# Patient Record
Sex: Female | Born: 1970
Health system: Southern US, Community
[De-identification: ages and names within clinical notes are randomized; demographics above are authoritative.]

## PROBLEM LIST (undated history)

## (undated) DIAGNOSIS — K219 Gastro-esophageal reflux disease without esophagitis: Secondary | ICD-10-CM

## (undated) DIAGNOSIS — E559 Vitamin D deficiency, unspecified: Secondary | ICD-10-CM

## (undated) DIAGNOSIS — E785 Hyperlipidemia, unspecified: Secondary | ICD-10-CM

## (undated) DIAGNOSIS — R809 Proteinuria, unspecified: Secondary | ICD-10-CM

## (undated) DIAGNOSIS — R0602 Shortness of breath: Secondary | ICD-10-CM

## (undated) DIAGNOSIS — IMO0001 Reserved for inherently not codable concepts without codable children: Secondary | ICD-10-CM

## (undated) DIAGNOSIS — Z6841 Body Mass Index (BMI) 40.0 and over, adult: Secondary | ICD-10-CM

## (undated) DIAGNOSIS — N189 Chronic kidney disease, unspecified: Secondary | ICD-10-CM

## (undated) DIAGNOSIS — M51379 Other intervertebral disc degeneration, lumbosacral region without mention of lumbar back pain or lower extremity pain: Secondary | ICD-10-CM

## (undated) DIAGNOSIS — I10 Essential (primary) hypertension: Secondary | ICD-10-CM

## (undated) DIAGNOSIS — D0501 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: Secondary | ICD-10-CM

## (undated) DIAGNOSIS — R42 Dizziness and giddiness: Secondary | ICD-10-CM

## (undated) DIAGNOSIS — Z972 Presence of dental prosthetic device (complete) (partial): Secondary | ICD-10-CM

## (undated) DIAGNOSIS — F32A Depression, unspecified: Secondary | ICD-10-CM

## (undated) DIAGNOSIS — D649 Anemia, unspecified: Secondary | ICD-10-CM

## (undated) DIAGNOSIS — N182 Chronic kidney disease, stage 2 (mild): Secondary | ICD-10-CM

## (undated) DIAGNOSIS — D0500 Lobular carcinoma in situ of unspecified breast: Secondary | ICD-10-CM

## (undated) DIAGNOSIS — E119 Type 2 diabetes mellitus without complications: Secondary | ICD-10-CM

## (undated) DIAGNOSIS — F419 Anxiety disorder, unspecified: Secondary | ICD-10-CM

## (undated) DIAGNOSIS — M5136 Other intervertebral disc degeneration, lumbar region: Secondary | ICD-10-CM

## (undated) DIAGNOSIS — K449 Diaphragmatic hernia without obstruction or gangrene: Secondary | ICD-10-CM

## (undated) DIAGNOSIS — F329 Major depressive disorder, single episode, unspecified: Secondary | ICD-10-CM

## (undated) DIAGNOSIS — N2581 Secondary hyperparathyroidism of renal origin: Secondary | ICD-10-CM

## (undated) DIAGNOSIS — R1312 Dysphagia, oropharyngeal phase: Secondary | ICD-10-CM

## (undated) DIAGNOSIS — G4733 Obstructive sleep apnea (adult) (pediatric): Secondary | ICD-10-CM

## (undated) DIAGNOSIS — C801 Malignant (primary) neoplasm, unspecified: Secondary | ICD-10-CM

## (undated) DIAGNOSIS — R258 Other abnormal involuntary movements: Secondary | ICD-10-CM

## (undated) DIAGNOSIS — M797 Fibromyalgia: Secondary | ICD-10-CM

## (undated) DIAGNOSIS — M1990 Unspecified osteoarthritis, unspecified site: Secondary | ICD-10-CM

## (undated) DIAGNOSIS — E7800 Pure hypercholesterolemia, unspecified: Secondary | ICD-10-CM

## (undated) DIAGNOSIS — G629 Polyneuropathy, unspecified: Secondary | ICD-10-CM

## (undated) DIAGNOSIS — M5137 Other intervertebral disc degeneration, lumbosacral region: Secondary | ICD-10-CM

## (undated) DIAGNOSIS — D509 Iron deficiency anemia, unspecified: Secondary | ICD-10-CM

## (undated) DIAGNOSIS — D259 Leiomyoma of uterus, unspecified: Secondary | ICD-10-CM

## (undated) HISTORY — DX: Type 2 diabetes mellitus without complications: E11.9

## (undated) HISTORY — PX: BREAST EXCISIONAL BIOPSY: SUR124

## (undated) HISTORY — PX: BREAST SURGERY: SHX581

## (undated) HISTORY — DX: Hyperlipidemia, unspecified: E78.5

## (undated) HISTORY — PX: ABLATION: SHX5711

## (undated) HISTORY — DX: Chronic kidney disease, stage 2 (mild): N18.2

## (undated) HISTORY — PX: REFRACTIVE SURGERY: SHX103

## (undated) HISTORY — PX: TRIGGER FINGER RELEASE: SHX641

## (undated) HISTORY — DX: Essential (primary) hypertension: I10

## (undated) HISTORY — DX: Lobular carcinoma in situ of unspecified breast: D05.00

## (undated) HISTORY — DX: Obstructive sleep apnea (adult) (pediatric): G47.33

## (undated) HISTORY — DX: Dizziness and giddiness: R42

---

## 2000-11-04 ENCOUNTER — Emergency Department (HOSPITAL_COMMUNITY): Admission: EM | Admit: 2000-11-04 | Discharge: 2000-11-04 | Payer: Self-pay | Admitting: Emergency Medicine

## 2008-07-09 ENCOUNTER — Emergency Department (HOSPITAL_COMMUNITY): Admission: EM | Admit: 2008-07-09 | Discharge: 2008-07-09 | Payer: Self-pay | Admitting: Emergency Medicine

## 2008-07-25 ENCOUNTER — Emergency Department (HOSPITAL_COMMUNITY): Admission: EM | Admit: 2008-07-25 | Discharge: 2008-07-25 | Payer: Self-pay | Admitting: Family Medicine

## 2008-08-09 ENCOUNTER — Emergency Department (HOSPITAL_COMMUNITY): Admission: EM | Admit: 2008-08-09 | Discharge: 2008-08-09 | Payer: Self-pay | Admitting: Emergency Medicine

## 2008-08-30 ENCOUNTER — Encounter: Admission: RE | Admit: 2008-08-30 | Discharge: 2008-10-11 | Payer: Self-pay | Admitting: Orthopedic Surgery

## 2008-10-16 ENCOUNTER — Encounter: Admission: RE | Admit: 2008-10-16 | Discharge: 2008-11-15 | Payer: Self-pay | Admitting: Orthopedic Surgery

## 2010-11-03 ENCOUNTER — Ambulatory Visit (HOSPITAL_COMMUNITY)
Admission: RE | Admit: 2010-11-03 | Discharge: 2010-11-03 | Payer: Self-pay | Source: Home / Self Care | Attending: Gastroenterology | Admitting: Gastroenterology

## 2011-06-17 ENCOUNTER — Other Ambulatory Visit: Payer: Self-pay | Admitting: Obstetrics and Gynecology

## 2011-06-17 DIAGNOSIS — R809 Proteinuria, unspecified: Secondary | ICD-10-CM

## 2011-06-22 ENCOUNTER — Ambulatory Visit (HOSPITAL_COMMUNITY)
Admission: RE | Admit: 2011-06-22 | Discharge: 2011-06-22 | Disposition: A | Discharge status: Home / Self Care | Payer: Self-pay | Source: Ambulatory Visit | Attending: Obstetrics and Gynecology | Admitting: Obstetrics and Gynecology

## 2011-06-22 DIAGNOSIS — E119 Type 2 diabetes mellitus without complications: Secondary | ICD-10-CM | POA: Insufficient documentation

## 2011-06-22 DIAGNOSIS — R809 Proteinuria, unspecified: Secondary | ICD-10-CM | POA: Insufficient documentation

## 2011-06-22 DIAGNOSIS — I10 Essential (primary) hypertension: Secondary | ICD-10-CM | POA: Insufficient documentation

## 2011-10-13 HISTORY — PX: BREAST BIOPSY: SHX20

## 2011-11-25 ENCOUNTER — Other Ambulatory Visit (HOSPITAL_COMMUNITY): Payer: Self-pay | Admitting: Physician Assistant

## 2011-11-25 DIAGNOSIS — Z1231 Encounter for screening mammogram for malignant neoplasm of breast: Secondary | ICD-10-CM

## 2011-12-01 ENCOUNTER — Ambulatory Visit (HOSPITAL_COMMUNITY)
Admission: RE | Admit: 2011-12-01 | Discharge: 2011-12-01 | Disposition: A | Discharge status: Home / Self Care | Payer: Self-pay | Source: Ambulatory Visit | Attending: Physician Assistant | Admitting: Physician Assistant

## 2011-12-01 DIAGNOSIS — Z1231 Encounter for screening mammogram for malignant neoplasm of breast: Secondary | ICD-10-CM

## 2011-12-08 ENCOUNTER — Other Ambulatory Visit: Payer: Self-pay | Admitting: Physician Assistant

## 2011-12-08 DIAGNOSIS — R928 Other abnormal and inconclusive findings on diagnostic imaging of breast: Secondary | ICD-10-CM

## 2011-12-29 ENCOUNTER — Ambulatory Visit (INDEPENDENT_AMBULATORY_CARE_PROVIDER_SITE_OTHER): Payer: Self-pay | Admitting: *Deleted

## 2011-12-29 VITALS — BP 125/89 | HR 82 | Temp 97.7°F | Ht 63.0 in | Wt 245.5 lb

## 2011-12-29 DIAGNOSIS — Z1239 Encounter for other screening for malignant neoplasm of breast: Secondary | ICD-10-CM

## 2011-12-29 DIAGNOSIS — N63 Unspecified lump in unspecified breast: Secondary | ICD-10-CM

## 2011-12-29 DIAGNOSIS — N6315 Unspecified lump in the right breast, overlapping quadrants: Secondary | ICD-10-CM

## 2011-12-29 NOTE — Progress Notes (Signed)
Referred from the [facility] for additional imaging of the right breast. Initial screening mammogram was completed 12/01/11.  Pap Smear:    Pap smear not performed today. Per patient last Pap smear was March 2012 at the free Pap smear screening at [facility] and was normal. Per patient she has no history of abnormal Pap smears. No Pap smear results in EPIC.  Physical exam: Breasts Breasts symmetrical. No skin abnormalities bilateral breasts. No nipple retraction bilateral breasts. No nipple discharge bilateral breasts. No lymphadenopathy. No lumps palpated left breast. Palpated lump in the right breast at 9 o'clock around 1-2 cm from nipple. No complaints of pain or tenderness on exam. Patient referred to the [facility] of [facility] for right breat Diagnostic Mammogram and possible right breast ultrasound Monday, January 04, 2012 at 1020.         Pelvic/Bimanual No Pap smear completed today since last Pap smear was March 2012 and normal per patient. Pap smear not indicated per BCCCP guidelines.

## 2011-12-29 NOTE — Patient Instructions (Signed)
Taught patient how to perform BSE. Patient did not need a Pap smear today due to last Pap smear was March 2012 per patient. Told patient about free cervical cancer screenings to receive a Pap smear if would like one next year. Let her know BCCCP will cover Pap smears every 3 years unless has a history of abnormal Pap smears. Patient is scheduled for a right breast diagnostic mammogram next Monday, January 04, 2012 at 1020. Patient aware of appointment and will be there or if unable to make appointment will call the Breast Center and reschedule. Let patient know will follow up with her within the next couple weeks with results. Patient verbalized understanding.

## 2012-01-06 ENCOUNTER — Ambulatory Visit
Admission: RE | Admit: 2012-01-06 | Discharge: 2012-01-06 | Disposition: A | Discharge status: Home / Self Care | Payer: No Typology Code available for payment source | Source: Ambulatory Visit | Attending: Physician Assistant | Admitting: Physician Assistant

## 2012-01-06 DIAGNOSIS — R928 Other abnormal and inconclusive findings on diagnostic imaging of breast: Secondary | ICD-10-CM

## 2012-01-12 ENCOUNTER — Telehealth: Payer: Self-pay | Admitting: *Deleted

## 2012-01-12 NOTE — Telephone Encounter (Signed)
Telephoned pt at home left message to return call to Fonnie Mu [phone].

## 2012-01-13 ENCOUNTER — Telehealth: Payer: Self-pay | Admitting: *Deleted

## 2012-01-13 NOTE — Telephone Encounter (Signed)
Patient called me in regards to BCCCP results. Patient has received results from the [facility] for her diagnostic mammogram. Patient is aware that she will need a right breast diagnostic mammogram in 6 months. Told patient she can call and schedule at her convienece. Let patient know BCCCP will cover cost of follow up. Patient verbalized understanding.

## 2012-03-02 ENCOUNTER — Other Ambulatory Visit: Payer: Self-pay | Admitting: Obstetrics and Gynecology

## 2012-06-01 ENCOUNTER — Telehealth: Payer: Self-pay | Admitting: *Deleted

## 2012-06-01 NOTE — Telephone Encounter (Signed)
Telephoned patient at home # and spoke with patient and advised was time to schedule diagnostic mammogram at [facility]. Patient will call to schedule. Patient voiced understanding.

## 2012-06-03 ENCOUNTER — Other Ambulatory Visit: Payer: Self-pay | Admitting: Physician Assistant

## 2012-06-03 ENCOUNTER — Other Ambulatory Visit: Payer: Self-pay | Admitting: Obstetrics and Gynecology

## 2012-06-03 DIAGNOSIS — R921 Mammographic calcification found on diagnostic imaging of breast: Secondary | ICD-10-CM

## 2012-07-06 ENCOUNTER — Other Ambulatory Visit: Payer: Self-pay | Admitting: Obstetrics and Gynecology

## 2012-07-06 ENCOUNTER — Ambulatory Visit
Admission: RE | Admit: 2012-07-06 | Discharge: 2012-07-06 | Disposition: A | Discharge status: Home / Self Care | Payer: No Typology Code available for payment source | Source: Ambulatory Visit | Attending: Physician Assistant | Admitting: Physician Assistant

## 2012-07-06 DIAGNOSIS — R921 Mammographic calcification found on diagnostic imaging of breast: Secondary | ICD-10-CM

## 2012-07-12 DIAGNOSIS — D0501 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: Secondary | ICD-10-CM

## 2012-07-12 HISTORY — DX: 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: D05.01

## 2012-07-13 ENCOUNTER — Other Ambulatory Visit: Payer: Self-pay | Admitting: Obstetrics and Gynecology

## 2012-07-13 ENCOUNTER — Ambulatory Visit
Admission: RE | Admit: 2012-07-13 | Discharge: 2012-07-13 | Disposition: A | Discharge status: Home / Self Care | Payer: No Typology Code available for payment source | Source: Ambulatory Visit | Attending: Obstetrics and Gynecology | Admitting: Obstetrics and Gynecology

## 2012-07-13 DIAGNOSIS — R921 Mammographic calcification found on diagnostic imaging of breast: Secondary | ICD-10-CM

## 2012-07-13 DIAGNOSIS — N6099 Unspecified benign mammary dysplasia of unspecified breast: Secondary | ICD-10-CM | POA: Insufficient documentation

## 2012-07-14 ENCOUNTER — Ambulatory Visit
Admission: RE | Admit: 2012-07-14 | Discharge: 2012-07-14 | Disposition: A | Discharge status: Home / Self Care | Payer: No Typology Code available for payment source | Source: Ambulatory Visit | Attending: Obstetrics and Gynecology | Admitting: Obstetrics and Gynecology

## 2012-07-14 DIAGNOSIS — R921 Mammographic calcification found on diagnostic imaging of breast: Secondary | ICD-10-CM

## 2012-07-28 ENCOUNTER — Ambulatory Visit (INDEPENDENT_AMBULATORY_CARE_PROVIDER_SITE_OTHER): Payer: Medicaid Other | Admitting: Surgery

## 2012-07-28 ENCOUNTER — Encounter (INDEPENDENT_AMBULATORY_CARE_PROVIDER_SITE_OTHER): Payer: Self-pay | Admitting: Surgery

## 2012-07-28 VITALS — BP 138/80 | HR 80 | Temp 97.2°F | Resp 18 | Ht 63.0 in | Wt 247.0 lb

## 2012-07-28 DIAGNOSIS — I10 Essential (primary) hypertension: Secondary | ICD-10-CM | POA: Insufficient documentation

## 2012-07-28 DIAGNOSIS — Z1231 Encounter for screening mammogram for malignant neoplasm of breast: Secondary | ICD-10-CM

## 2012-07-28 DIAGNOSIS — E119 Type 2 diabetes mellitus without complications: Secondary | ICD-10-CM | POA: Insufficient documentation

## 2012-07-28 DIAGNOSIS — N6099 Unspecified benign mammary dysplasia of unspecified breast: Secondary | ICD-10-CM

## 2012-07-28 DIAGNOSIS — E7800 Pure hypercholesterolemia, unspecified: Secondary | ICD-10-CM | POA: Insufficient documentation

## 2012-07-28 NOTE — Patient Instructions (Signed)
We will schedule surgery to remove the abnormal area from your right breast

## 2012-07-28 NOTE — Addendum Note (Signed)
Addended by: Currie Paris on: 07/28/2012 03:07 PM   Modules accepted: Orders

## 2012-07-28 NOTE — Progress Notes (Signed)
Patient ID: Alicia Coffey, female   DOB: 09/14/1971, 41 y.o.   MRN: [id number]  Chief Complaint  Patient presents with  . Breast Mass    Right ALH on NCB    HPI Alicia Coffey is a 41 y.o. female.  She was found to have a area of calcifications in the right breast and a followup six-month mammogram showed some slight change and a core biopsy was done. This has been shown LCIS and surgical consultation was therefore recommended. She is having no breast symptoms at all. A grandmother had breast cancer at about age 47 but apparently is cured as she is still alive 35 years later. HPI  Past Medical History  Diagnosis Date  . Hypertension   . Diabetes mellitus   . Hyperlipidemia     No past surgical history on file.  Family History  Problem Relation Age of Onset  . Breast cancer Paternal Grandmother     breast  . Diabetes Father   . Heart disease Father   . Hypertension Father   . Diabetes Mother   . Heart disease Mother   . Hypertension Mother   . Diabetes Brother   . Diabetes Sister   . Diabetes Sister     Social History History  Substance Use Topics  . Smoking status: Never Smoker   . Smokeless tobacco: Never Used  . Alcohol Use: No    No Known Allergies  Current Outpatient Prescriptions  Medication Sig Dispense Refill  . fish oil-omega-3 fatty acids 1000 MG capsule Take 2 g by mouth daily.      . glipiZIDE (GLUCOTROL) 10 MG tablet Take 10 mg by mouth 2 (two) times daily before a meal.      . insulin aspart (NOVOLOG) 100 UNIT/ML injection Inject into the skin 3 (three) times daily before meals. Sliding scale      . insulin glargine (LANTUS) 100 UNIT/ML injection Inject 14 Units into the skin at bedtime.      . lisinopril-hydrochlorothiazide (PRINZIDE,ZESTORETIC) 20-25 MG per tablet Take 1 tablet by mouth 2 (two) times daily.      . metFORMIN (GLUCOPHAGE) 1000 MG tablet Take 1,000 mg by mouth 2 times daily at 12 noon and 4 pm.      . pravastatin (PRAVACHOL) 40 MG  tablet Take 40 mg by mouth at bedtime.      . vitamin E 400 UNIT capsule Take 400 Units by mouth daily.        Review of Systems Review of Systems  Constitutional: Negative for fever, chills and unexpected weight change.  HENT: Negative for hearing loss, congestion, sore throat, trouble swallowing and voice change.   Eyes: Negative for visual disturbance.  Respiratory: Negative for cough and wheezing.   Cardiovascular: Negative for chest pain, palpitations and leg swelling.  Gastrointestinal: Negative for nausea, vomiting, abdominal pain, diarrhea, constipation, blood in stool, abdominal distention and anal bleeding.  Genitourinary: Negative for hematuria, vaginal bleeding and difficulty urinating.  Musculoskeletal: Negative for arthralgias.  Skin: Negative for rash and wound.  Neurological: Negative for seizures, syncope and headaches.  Hematological: Negative for adenopathy. Does not bruise/bleed easily.  Psychiatric/Behavioral: Negative for confusion.    Blood pressure 138/80, pulse 80, temperature 97.2 F (36.2 C), temperature source Temporal, resp. rate 18, height 5' 3" (1.6 m), weight 247 lb (112.038 kg), last menstrual period 07/06/2012.  Physical Exam Physical Exam  Vitals reviewed. Constitutional: She is oriented to person, place, and time. She appears well-developed and well-nourished. No distress.    HENT:  Head: Normocephalic and atraumatic.  Mouth/Throat: Oropharynx is clear and moist.  Eyes: Conjunctivae normal and EOM are normal. Pupils are equal, round, and reactive to light. No scleral icterus.  Neck: Normal range of motion. Neck supple. No tracheal deviation present. No thyromegaly present.  Cardiovascular: Normal rate, regular rhythm, normal heart sounds and intact distal pulses.  Exam reveals no gallop and no friction rub.   No murmur heard. Pulmonary/Chest: Effort normal and breath sounds normal. No respiratory distress. She has no wheezes. She has no rales.          A small mass in the right breast lower outer quadrant consistent with post biopsy small hematoma  Abdominal: Soft. Bowel sounds are normal. She exhibits no distension and no mass. There is no tenderness. There is no rebound and no guarding.  Musculoskeletal: Normal range of motion. She exhibits no edema and no tenderness.  Lymphadenopathy:    She has no cervical adenopathy.    She has no axillary adenopathy.       Right: No supraclavicular adenopathy present.       Left: No supraclavicular adenopathy present.  Neurological: She is alert and oriented to person, place, and time.  Skin: Skin is warm and dry. No rash noted. She is not diaphoretic. No erythema.  Psychiatric: She has a normal mood and affect. Her behavior is normal. Judgment and thought content normal.    Data Reviewed I have reviewed mammogram reports and pathology reports.  Assessment    1. LCIS right breast, lower outer quadrant 2. Hypertension 3 high cholesterol 4 diabetes    Plan    I have recommended wire localized excision of this area. I have discussed the indications for the lumpectomy and described the procedure. She understand that the chance of removal of the abnormal area is very good, but that occasionally we are unable to locate it and may have to do a second procedure. We also discussed the possibility of a second procedure to get additional tissue. Risks of surgery such as bleeding and infection have also been explained, as well as the implications of not doing the surgery. She understands and wishes to proceed.        Rani Sisney J 07/28/2012, 2:40 PM    

## 2012-07-28 NOTE — Addendum Note (Signed)
Addended by: Currie Paris on: 07/28/2012 05:50 PM   Modules accepted: Orders

## 2012-08-09 ENCOUNTER — Encounter (HOSPITAL_BASED_OUTPATIENT_CLINIC_OR_DEPARTMENT_OTHER): Payer: Self-pay | Admitting: *Deleted

## 2012-08-09 NOTE — Pre-Procedure Instructions (Signed)
To come for BMET and EKG 

## 2012-08-11 ENCOUNTER — Encounter (HOSPITAL_BASED_OUTPATIENT_CLINIC_OR_DEPARTMENT_OTHER)
Admission: RE | Admit: 2012-08-11 | Discharge: 2012-08-11 | Disposition: A | Discharge status: Home / Self Care | Payer: Medicaid Other | Source: Ambulatory Visit | Attending: Surgery | Admitting: Surgery

## 2012-08-11 ENCOUNTER — Other Ambulatory Visit: Payer: Self-pay

## 2012-08-11 LAB — BASIC METABOLIC PANEL
BUN: 16 mg/dL (ref 6–23)
CO2: 27 mEq/L (ref 19–32)
Chloride: 101 mEq/L (ref 96–112)
GFR calc non Af Amer: >90 mL/min (ref >90)
Glucose, Bld: 146 mg/dL — ABNORMAL HIGH (ref 70–99)
Potassium: 4.4 mEq/L (ref 3.5–5.1)

## 2012-08-15 ENCOUNTER — Ambulatory Visit
Admission: RE | Admit: 2012-08-15 | Discharge: 2012-08-15 | Disposition: A | Discharge status: Home / Self Care | Payer: No Typology Code available for payment source | Source: Ambulatory Visit | Attending: Surgery | Admitting: Surgery

## 2012-08-15 ENCOUNTER — Ambulatory Visit (HOSPITAL_BASED_OUTPATIENT_CLINIC_OR_DEPARTMENT_OTHER)
Admission: RE | Admit: 2012-08-15 | Discharge: 2012-08-15 | Disposition: A | Discharge status: Home / Self Care | Payer: Medicaid Other | Source: Ambulatory Visit | Attending: Surgery | Admitting: Surgery

## 2012-08-15 ENCOUNTER — Encounter (HOSPITAL_BASED_OUTPATIENT_CLINIC_OR_DEPARTMENT_OTHER)
Admission: RE | Disposition: A | Discharge status: Home / Self Care | Payer: Self-pay | Source: Ambulatory Visit | Attending: Surgery

## 2012-08-15 ENCOUNTER — Encounter (HOSPITAL_BASED_OUTPATIENT_CLINIC_OR_DEPARTMENT_OTHER): Payer: Self-pay | Admitting: Certified Registered"

## 2012-08-15 ENCOUNTER — Encounter (HOSPITAL_BASED_OUTPATIENT_CLINIC_OR_DEPARTMENT_OTHER): Payer: Self-pay | Admitting: *Deleted

## 2012-08-15 ENCOUNTER — Encounter (HOSPITAL_BASED_OUTPATIENT_CLINIC_OR_DEPARTMENT_OTHER): Payer: Self-pay | Admitting: Anesthesiology

## 2012-08-15 ENCOUNTER — Other Ambulatory Visit (INDEPENDENT_AMBULATORY_CARE_PROVIDER_SITE_OTHER): Payer: Self-pay | Admitting: Surgery

## 2012-08-15 ENCOUNTER — Ambulatory Visit (HOSPITAL_BASED_OUTPATIENT_CLINIC_OR_DEPARTMENT_OTHER): Payer: Medicaid Other | Admitting: Certified Registered"

## 2012-08-15 DIAGNOSIS — E119 Type 2 diabetes mellitus without complications: Secondary | ICD-10-CM | POA: Insufficient documentation

## 2012-08-15 DIAGNOSIS — D0590 Unspecified type of carcinoma in situ of unspecified breast: Secondary | ICD-10-CM

## 2012-08-15 DIAGNOSIS — Z01812 Encounter for preprocedural laboratory examination: Secondary | ICD-10-CM | POA: Insufficient documentation

## 2012-08-15 DIAGNOSIS — Z79899 Other long term (current) drug therapy: Secondary | ICD-10-CM | POA: Insufficient documentation

## 2012-08-15 DIAGNOSIS — E785 Hyperlipidemia, unspecified: Secondary | ICD-10-CM | POA: Insufficient documentation

## 2012-08-15 DIAGNOSIS — Z794 Long term (current) use of insulin: Secondary | ICD-10-CM | POA: Insufficient documentation

## 2012-08-15 DIAGNOSIS — N6099 Unspecified benign mammary dysplasia of unspecified breast: Secondary | ICD-10-CM

## 2012-08-15 DIAGNOSIS — I10 Essential (primary) hypertension: Secondary | ICD-10-CM | POA: Insufficient documentation

## 2012-08-15 DIAGNOSIS — Z01810 Encounter for preprocedural cardiovascular examination: Secondary | ICD-10-CM | POA: Insufficient documentation

## 2012-08-15 HISTORY — PX: BREAST LUMPECTOMY WITH NEEDLE LOCALIZATION: SHX5759

## 2012-08-15 HISTORY — DX: Presence of dental prosthetic device (complete) (partial): Z97.2

## 2012-08-15 HISTORY — DX: Unspecified osteoarthritis, unspecified site: M19.90

## 2012-08-15 HISTORY — DX: 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: D05.01

## 2012-08-15 SURGERY — BREAST LUMPECTOMY WITH NEEDLE LOCALIZATION
Anesthesia: General | Site: Breast | Laterality: Right | Wound class: Clean

## 2012-08-15 MED ORDER — OXYCODONE HCL 5 MG PO TABS
5.0000 mg | ORAL_TABLET | Freq: Once | ORAL | Status: AC | PRN
  Administered 2012-08-15: 5 mg via ORAL

## 2012-08-15 MED ORDER — LIDOCAINE HCL (CARDIAC) 20 MG/ML IV SOLN
INTRAVENOUS | Status: DC | PRN
  Administered 2012-08-15: 80 mg via INTRAVENOUS

## 2012-08-15 MED ORDER — LACTATED RINGERS IV SOLN
INTRAVENOUS | Status: DC
  Administered 2012-08-15 (×2): via INTRAVENOUS

## 2012-08-15 MED ORDER — OXYCODONE HCL 5 MG/5ML PO SOLN: 5.0000 mg | Freq: Once | ORAL | Status: AC | PRN

## 2012-08-15 MED ORDER — CEFAZOLIN SODIUM-DEXTROSE 2-3 GM-% IV SOLR
2.0000 g | INTRAVENOUS | Status: AC
  Administered 2012-08-15: 2 g via INTRAVENOUS

## 2012-08-15 MED ORDER — FENTANYL CITRATE 0.05 MG/ML IJ SOLN
INTRAMUSCULAR | Status: DC | PRN
  Administered 2012-08-15 (×2): 50 ug via INTRAVENOUS

## 2012-08-15 MED ORDER — MIDAZOLAM HCL 5 MG/5ML IJ SOLN
INTRAMUSCULAR | Status: DC | PRN
  Administered 2012-08-15: 2 mg via INTRAVENOUS

## 2012-08-15 MED ORDER — ONDANSETRON HCL 4 MG/2ML IJ SOLN: 4.0000 mg | Freq: Once | INTRAMUSCULAR | Status: DC | PRN

## 2012-08-15 MED ORDER — PROPOFOL 10 MG/ML IV BOLUS
INTRAVENOUS | Status: DC | PRN
  Administered 2012-08-15: 200 mg via INTRAVENOUS

## 2012-08-15 MED ORDER — EPHEDRINE SULFATE 50 MG/ML IJ SOLN
INTRAMUSCULAR | Status: DC | PRN
  Administered 2012-08-15: 10 mg via INTRAVENOUS

## 2012-08-15 MED ORDER — ONDANSETRON HCL 4 MG/2ML IJ SOLN
INTRAMUSCULAR | Status: DC | PRN
  Administered 2012-08-15: 4 mg via INTRAVENOUS

## 2012-08-15 MED ORDER — CHLORHEXIDINE GLUCONATE 4 % EX LIQD: 1.0000 "application " | Freq: Once | CUTANEOUS | Status: DC

## 2012-08-15 MED ORDER — HYDROCODONE-ACETAMINOPHEN 5-325 MG PO TABS: 1.0000 | ORAL_TABLET | ORAL | Status: DC | PRN

## 2012-08-15 MED ORDER — BUPIVACAINE HCL (PF) 0.25 % IJ SOLN
INTRAMUSCULAR | Status: DC | PRN
  Administered 2012-08-15: 40 mL

## 2012-08-15 MED ORDER — HYDROMORPHONE HCL PF 1 MG/ML IJ SOLN: 0.2500 mg | INTRAMUSCULAR | Status: DC | PRN

## 2012-08-15 SURGICAL SUPPLY — 47 items
APPLICATOR COTTON TIP 6IN STRL (MISCELLANEOUS) IMPLANT
BINDER BREAST LRG (GAUZE/BANDAGES/DRESSINGS) IMPLANT
BINDER BREAST MEDIUM (GAUZE/BANDAGES/DRESSINGS) IMPLANT
BINDER BREAST XLRG (GAUZE/BANDAGES/DRESSINGS) IMPLANT
BINDER BREAST XXLRG (GAUZE/BANDAGES/DRESSINGS) ×2 IMPLANT
BLADE HEX COATED 2.75 (ELECTRODE) ×2 IMPLANT
BLADE SURG 15 STRL LF DISP TIS (BLADE) ×1 IMPLANT
BLADE SURG 15 STRL SS (BLADE) ×1
CANISTER SUCTION 1200CC (MISCELLANEOUS) ×2 IMPLANT
CHLORAPREP W/TINT 26ML (MISCELLANEOUS) ×2 IMPLANT
CLIP TI MEDIUM 6 (CLIP) IMPLANT
CLIP TI WIDE RED SMALL 6 (CLIP) IMPLANT
CLOTH BEACON ORANGE TIMEOUT ST (SAFETY) ×2 IMPLANT
COVER MAYO STAND STRL (DRAPES) ×2 IMPLANT
COVER TABLE BACK 60X90 (DRAPES) ×2 IMPLANT
DECANTER SPIKE VIAL GLASS SM (MISCELLANEOUS) IMPLANT
DERMABOND ADVANCED (GAUZE/BANDAGES/DRESSINGS) ×1
DERMABOND ADVANCED .7 DNX12 (GAUZE/BANDAGES/DRESSINGS) ×1 IMPLANT
DEVICE DUBIN W/COMP PLATE 8390 (MISCELLANEOUS) ×2 IMPLANT
DRAPE LAPAROTOMY TRNSV 102X78 (DRAPE) ×2 IMPLANT
DRAPE SURG 17X23 STRL (DRAPES) IMPLANT
DRAPE UTILITY XL STRL (DRAPES) ×2 IMPLANT
ELECT REM PT RETURN 9FT ADLT (ELECTROSURGICAL) ×2
ELECTRODE REM PT RTRN 9FT ADLT (ELECTROSURGICAL) ×1 IMPLANT
GLOVE EUDERMIC 7 POWDERFREE (GLOVE) ×2 IMPLANT
GLOVE SKINSENSE NS SZ7.0 (GLOVE) ×1
GLOVE SKINSENSE STRL SZ7.0 (GLOVE) ×1 IMPLANT
GOWN PREVENTION PLUS XLARGE (GOWN DISPOSABLE) ×4 IMPLANT
KIT MARKER MARGIN INK (KITS) IMPLANT
NEEDLE HYPO 25X1 1.5 SAFETY (NEEDLE) ×2 IMPLANT
NS IRRIG 1000ML POUR BTL (IV SOLUTION) IMPLANT
PACK BASIN DAY SURGERY FS (CUSTOM PROCEDURE TRAY) ×2 IMPLANT
PENCIL BUTTON HOLSTER BLD 10FT (ELECTRODE) ×2 IMPLANT
SHEET MEDIUM DRAPE 40X70 STRL (DRAPES) IMPLANT
SLEEVE SCD COMPRESS KNEE MED (MISCELLANEOUS) ×2 IMPLANT
SPONGE GAUZE 4X4 12PLY (GAUZE/BANDAGES/DRESSINGS) IMPLANT
SPONGE INTESTINAL PEANUT (DISPOSABLE) IMPLANT
SPONGE LAP 4X18 X RAY DECT (DISPOSABLE) ×2 IMPLANT
STAPLER VISISTAT 35W (STAPLE) IMPLANT
SUT MNCRL AB 4-0 PS2 18 (SUTURE) ×2 IMPLANT
SUT SILK 0 TIES 10X30 (SUTURE) IMPLANT
SUT VICRYL 3-0 CR8 SH (SUTURE) ×2 IMPLANT
SYR CONTROL 10ML LL (SYRINGE) ×2 IMPLANT
TOWEL OR NON WOVEN STRL DISP B (DISPOSABLE) ×2 IMPLANT
TUBE CONNECTING 20X1/4 (TUBING) ×2 IMPLANT
WATER STERILE IRR 1000ML POUR (IV SOLUTION) ×2 IMPLANT
YANKAUER SUCT BULB TIP NO VENT (SUCTIONS) ×2 IMPLANT

## 2012-08-15 NOTE — Transfer of Care (Signed)
Immediate Anesthesia Transfer of Care Note  Patient: Alicia Coffey  Procedure(s) Performed: Procedure(s) (LRB) with comments: BREAST LUMPECTOMY WITH NEEDLE LOCALIZATION (Right) - Wire localizations Right breast calcifications  Patient Location: PACU  Anesthesia Type:General  Level of Consciousness: awake, alert  and oriented  Airway & Oxygen Therapy: Patient Spontanous Breathing and Patient connected to face mask oxygen  Post-op Assessment: Report given to PACU RN and Post -op Vital signs reviewed and stable  Post vital signs: Reviewed and stable  Complications: No apparent anesthesia complications

## 2012-08-15 NOTE — Anesthesia Postprocedure Evaluation (Signed)
  Anesthesia Post-op Note  Patient: Alicia Coffey  Procedure(s) Performed: Procedure(s) (LRB) with comments: BREAST LUMPECTOMY WITH NEEDLE LOCALIZATION (Right) - Wire localizations Right breast calcifications  Patient Location: PACU  Anesthesia Type:General  Level of Consciousness: awake, alert  and oriented  Airway and Oxygen Therapy: Patient Spontanous Breathing and Patient connected to face mask oxygen  Post-op Pain: mild  Post-op Assessment: Post-op Vital signs reviewed  Post-op Vital Signs: Reviewed  Complications: No apparent anesthesia complications

## 2012-08-15 NOTE — Anesthesia Preprocedure Evaluation (Signed)
Anesthesia Evaluation  Patient identified by MRN, date of birth, ID band Patient awake    Reviewed: Allergy & Precautions, H&P , NPO status , Patient's Chart, lab work & pertinent test results  Airway Mallampati: I TM Distance: >3 FB Neck ROM: Full    Dental  (+) Upper Dentures, Lower Dentures and Dental Advisory Given   Pulmonary  breath sounds clear to auscultation        Cardiovascular hypertension, Pt. on medications Rhythm:Regular Rate:Normal     Neuro/Psych    GI/Hepatic   Endo/Other  diabetes, Well Controlled, Type 2, Insulin Dependent and Oral Hypoglycemic AgentsMorbid obesity  Renal/GU      Musculoskeletal   Abdominal   Peds  Hematology   Anesthesia Other Findings   Reproductive/Obstetrics                           Anesthesia Physical Anesthesia Plan  ASA: III  Anesthesia Plan: General   Post-op Pain Management:    Induction: Intravenous  Airway Management Planned:   Additional Equipment:   Intra-op Plan:   Post-operative Plan: Extubation in OR  Informed Consent: I have reviewed the patients History and Physical, chart, labs and discussed the procedure including the risks, benefits and alternatives for the proposed anesthesia with the patient or authorized representative who has indicated his/her understanding and acceptance.   Dental advisory given  Plan Discussed with: Anesthesiologist, Surgeon and CRNA  Anesthesia Plan Comments:         Anesthesia Quick Evaluation

## 2012-08-15 NOTE — Anesthesia Procedure Notes (Addendum)
Performed by: Signa Kell C   Procedure Name: LMA Insertion Date/Time: 08/15/2012 12:54 PM Performed by: Burna Cash Pre-anesthesia Checklist: Patient identified, Emergency Drugs available, Suction available and Patient being monitored Patient Re-evaluated:Patient Re-evaluated prior to inductionOxygen Delivery Method: Circle System Utilized Preoxygenation: Pre-oxygenation with 100% oxygen Intubation Type: IV induction Ventilation: Mask ventilation without difficulty LMA: LMA with gastric port inserted LMA Size: 4.0 Number of attempts: 1 Airway Equipment and Method: bite block Placement Confirmation: positive ETCO2 Tube secured with: Tape Dental Injury: Teeth and Oropharynx as per pre-operative assessment

## 2012-08-15 NOTE — H&P (View-Only) (Signed)
Patient ID: Alicia Coffey, female   DOB: July 19, 1971, 41 y.o.   MRN: [id number]  Chief Complaint  Patient presents with  . Breast Mass    Right ALH on NCB    HPI Alicia Coffey is a 41 y.o. female.  She was found to have a area of calcifications in the right breast and a followup six-month mammogram showed some slight change and a core biopsy was done. This has been shown LCIS and surgical consultation was therefore recommended. She is having no breast symptoms at all. A grandmother had breast cancer at about age 55 but apparently is cured as she is still alive 35 years later. HPI  Past Medical History  Diagnosis Date  . Hypertension   . Diabetes mellitus   . Hyperlipidemia     No past surgical history on file.  Family History  Problem Relation Age of Onset  . Breast cancer Paternal Grandmother     breast  . Diabetes Father   . Heart disease Father   . Hypertension Father   . Diabetes Mother   . Heart disease Mother   . Hypertension Mother   . Diabetes Brother   . Diabetes Sister   . Diabetes Sister     Social History History  Substance Use Topics  . Smoking status: Never Smoker   . Smokeless tobacco: Never Used  . Alcohol Use: No    No Known Allergies  Current Outpatient Prescriptions  Medication Sig Dispense Refill  . fish oil-omega-3 fatty acids 1000 MG capsule Take 2 g by mouth daily.      Marland Kitchen glipiZIDE (GLUCOTROL) 10 MG tablet Take 10 mg by mouth 2 (two) times daily before a meal.      . insulin aspart (NOVOLOG) 100 UNIT/ML injection Inject into the skin 3 (three) times daily before meals. Sliding scale      . insulin glargine (LANTUS) 100 UNIT/ML injection Inject 14 Units into the skin at bedtime.      Marland Kitchen lisinopril-hydrochlorothiazide (PRINZIDE,ZESTORETIC) 20-25 MG per tablet Take 1 tablet by mouth 2 (two) times daily.      . metFORMIN (GLUCOPHAGE) 1000 MG tablet Take 1,000 mg by mouth 2 times daily at 12 noon and 4 pm.      . pravastatin (PRAVACHOL) 40 MG  tablet Take 40 mg by mouth at bedtime.      . vitamin E 400 UNIT capsule Take 400 Units by mouth daily.        Review of Systems Review of Systems  Constitutional: Negative for fever, chills and unexpected weight change.  HENT: Negative for hearing loss, congestion, sore throat, trouble swallowing and voice change.   Eyes: Negative for visual disturbance.  Respiratory: Negative for cough and wheezing.   Cardiovascular: Negative for chest pain, palpitations and leg swelling.  Gastrointestinal: Negative for nausea, vomiting, abdominal pain, diarrhea, constipation, blood in stool, abdominal distention and anal bleeding.  Genitourinary: Negative for hematuria, vaginal bleeding and difficulty urinating.  Musculoskeletal: Negative for arthralgias.  Skin: Negative for rash and wound.  Neurological: Negative for seizures, syncope and headaches.  Hematological: Negative for adenopathy. Does not bruise/bleed easily.  Psychiatric/Behavioral: Negative for confusion.    Blood pressure 138/80, pulse 80, temperature 97.2 F (36.2 C), temperature source Temporal, resp. rate 18, height 5\' 3"  (1.6 m), weight 247 lb (112.038 kg), last menstrual period 07/06/2012.  Physical Exam Physical Exam  Vitals reviewed. Constitutional: She is oriented to person, place, and time. She appears well-developed and well-nourished. No distress.  HENT:  Head: Normocephalic and atraumatic.  Mouth/Throat: Oropharynx is clear and moist.  Eyes: Conjunctivae normal and EOM are normal. Pupils are equal, round, and reactive to light. No scleral icterus.  Neck: Normal range of motion. Neck supple. No tracheal deviation present. No thyromegaly present.  Cardiovascular: Normal rate, regular rhythm, normal heart sounds and intact distal pulses.  Exam reveals no gallop and no friction rub.   No murmur heard. Pulmonary/Chest: Effort normal and breath sounds normal. No respiratory distress. She has no wheezes. She has no rales.          A small mass in the right breast lower outer quadrant consistent with post biopsy small hematoma  Abdominal: Soft. Bowel sounds are normal. She exhibits no distension and no mass. There is no tenderness. There is no rebound and no guarding.  Musculoskeletal: Normal range of motion. She exhibits no edema and no tenderness.  Lymphadenopathy:    She has no cervical adenopathy.    She has no axillary adenopathy.       Right: No supraclavicular adenopathy present.       Left: No supraclavicular adenopathy present.  Neurological: She is alert and oriented to person, place, and time.  Skin: Skin is warm and dry. No rash noted. She is not diaphoretic. No erythema.  Psychiatric: She has a normal mood and affect. Her behavior is normal. Judgment and thought content normal.    Data Reviewed I have reviewed mammogram reports and pathology reports.  Assessment    1. LCIS right breast, lower outer quadrant 2. Hypertension 3 high cholesterol 4 diabetes    Plan    I have recommended wire localized excision of this area. I have discussed the indications for the lumpectomy and described the procedure. She understand that the chance of removal of the abnormal area is very good, but that occasionally we are unable to locate it and may have to do a second procedure. We also discussed the possibility of a second procedure to get additional tissue. Risks of surgery such as bleeding and infection have also been explained, as well as the implications of not doing the surgery. She understands and wishes to proceed.        Kennah Hehr J 07/28/2012, 2:40 PM

## 2012-08-15 NOTE — Op Note (Signed)
Alicia Coffey  1971-10-02  [id number]  08/15/2012   Preoperative diagnosis: calcifications, lower outer quadrant of right breast, with LCIS found on needle core biopsy  Postoperative diagnosis: the same  Procedure: wire localized excision of area of calcifications and LCIS, right breast, lower outer quadrant  Surgeon: Currie Paris, MD, FACS  Anesthesia: General  Clinical History and Indications: this patient presents for a guidewire localized excision of a left breast LCIS and calcifications.  Description of procedure: The patient was seen in the holding area and the plans for the procedure reviewed. The left breast was marked as the operative side. The wire localizing films were reviewed.the guidewires entered in the lower outer quadrant and tracked in a radial fashion towards the nipple areolar complex. There were 2 guidewires so the area was well bracketed for excision.  The patient was taken to the operating room and after satisfactory general anesthesia had been obtained the left breast was prepped and draped and the timeout was performed.  The incision was made over the presumed area of the calcifications. This is a radially oriented incision basically connecting the 2 guidewires and then extending towards the nipple.. Skin flaps were raised and using cautery the area was completely excised.I thought I was well around all the area and was as deep as the fascia. Bleeders were controlled with either cautery or sutures as needed.  After achieving hemostasis, the incision was closed with several layers of3-0 Vicryl, 4-0 Monocryl subcuticular, and Dermabond.  The patient tolerated the procedure well. There were no operative complications. All counts were correct.  The radiologist reported that the calcifications appear to be well contained in the specimen.  Currie Paris, MD, FACS 08/15/2012 1:56 PM

## 2012-08-15 NOTE — Interval H&P Note (Signed)
History and Physical Interval Note:  08/15/2012 12:23 PM  Alicia Coffey  has presented today for surgery, with the diagnosis of LCIS Right breast  The various methods of treatment have been discussed with the patient and family. After consideration of risks, benefits and other options for treatment, the patient has consented to  Procedure(s) (LRB) with comments: BREAST LUMPECTOMY WITH NEEDLE LOCALIZATION (Right) - Wire localizations Right breast calcifications as a surgical intervention .  The patient's history has been reviewed, patient examined, no change in status, stable for surgery.  I have reviewed the patient's chart and labs.  Questions were answered to the patient's satisfaction.     Stillman Buenger J

## 2012-08-16 ENCOUNTER — Encounter (HOSPITAL_BASED_OUTPATIENT_CLINIC_OR_DEPARTMENT_OTHER): Payer: Self-pay | Admitting: Surgery

## 2012-08-19 ENCOUNTER — Telehealth (INDEPENDENT_AMBULATORY_CARE_PROVIDER_SITE_OTHER): Payer: Self-pay | Admitting: General Surgery

## 2012-08-19 NOTE — Telephone Encounter (Signed)
Left message on machine for patient to call back for path results. Pathology showed LCIS, like was known prior to surgery, but no invasive cancer. No other surgery needed. Awaiting patient's call back.

## 2012-08-19 NOTE — Telephone Encounter (Signed)
Pt calling for path result; informed a message had been left today on her home phone, with the good news.  No new cancer and no more surgery indicated.  She was verbally pleased.

## 2012-09-02 ENCOUNTER — Encounter (INDEPENDENT_AMBULATORY_CARE_PROVIDER_SITE_OTHER): Payer: Self-pay | Admitting: General Surgery

## 2012-09-02 ENCOUNTER — Encounter (INDEPENDENT_AMBULATORY_CARE_PROVIDER_SITE_OTHER): Payer: Self-pay | Admitting: Surgery

## 2012-09-02 ENCOUNTER — Ambulatory Visit (INDEPENDENT_AMBULATORY_CARE_PROVIDER_SITE_OTHER): Payer: PRIVATE HEALTH INSURANCE | Admitting: Surgery

## 2012-09-02 ENCOUNTER — Other Ambulatory Visit (INDEPENDENT_AMBULATORY_CARE_PROVIDER_SITE_OTHER): Payer: Self-pay | Admitting: Surgery

## 2012-09-02 VITALS — BP 128/77 | HR 77 | Temp 97.3°F | Resp 18 | Ht 63.0 in | Wt 246.8 lb

## 2012-09-02 DIAGNOSIS — C50911 Malignant neoplasm of unspecified site of right female breast: Secondary | ICD-10-CM

## 2012-09-02 DIAGNOSIS — Z09 Encounter for follow-up examination after completed treatment for conditions other than malignant neoplasm: Secondary | ICD-10-CM

## 2012-09-02 NOTE — Progress Notes (Signed)
NAME: Alicia Coffey                                            DOB: 1971-08-28 DATE: 09/02/2012                                                  MRN: [id number]  CC: Post op   HPI: This patient comes in for post op follow-up .Sheunderwent right NL lumpectomy on 08/15/12. She feels that she is doing well.  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 - FIBROCYSTIC CHANGES WITH CALCIFICATIONS. - HEALING BIOPSY SITE. - SEE COMMENT. Microscopic Comment The surgical resection margins of the specimen were inked and microscopically evaluated. (JBK:eps 08/17/12) Pecola Leisure MD Pathologist, Electronic Signature (Case signed 08/17/2012)  IMPRESSION: The patient is doing well S/P right breast Bx.    PLAN: Discussed path with her and will make referral to the breast high risk clinic. RTC pRN  OK to work as of 09/12/12

## 2012-09-02 NOTE — Patient Instructions (Signed)
you may return to work on December 2 with no restrictions  We will make a consultation request for the high risk breast cancer clinic to see if there are any options to reduce your chance of developing breast cancer in the future

## 2012-09-05 ENCOUNTER — Telehealth: Payer: Self-pay | Admitting: Oncology

## 2012-09-05 NOTE — Telephone Encounter (Signed)
LVOM for pt to return call.  °

## 2012-09-07 ENCOUNTER — Telehealth: Payer: Self-pay | Admitting: Oncology

## 2012-09-07 NOTE — Telephone Encounter (Signed)
C/D 09/07/12 for appt. 09/19/12

## 2012-09-07 NOTE — Telephone Encounter (Signed)
S/W pt in re High Risk Clinic appt 12/09 @ 1:30 w/Dr. Welton Flakes.  Welcome packet mailed.  Referring Dr. Jamey Ripa.

## 2012-09-11 DIAGNOSIS — D0500 Lobular carcinoma in situ of unspecified breast: Secondary | ICD-10-CM

## 2012-09-11 HISTORY — DX: Lobular carcinoma in situ of unspecified breast: D05.00

## 2012-09-19 ENCOUNTER — Encounter: Payer: Self-pay | Admitting: Oncology

## 2012-09-19 ENCOUNTER — Telehealth: Payer: Self-pay | Admitting: *Deleted

## 2012-09-19 ENCOUNTER — Ambulatory Visit (HOSPITAL_BASED_OUTPATIENT_CLINIC_OR_DEPARTMENT_OTHER): Payer: Medicaid Other | Admitting: Oncology

## 2012-09-19 VITALS — BP 123/76 | HR 79 | Temp 98.7°F | Resp 20 | Ht 63.0 in | Wt 248.9 lb

## 2012-09-19 DIAGNOSIS — D0500 Lobular carcinoma in situ of unspecified breast: Secondary | ICD-10-CM

## 2012-09-19 DIAGNOSIS — Z803 Family history of malignant neoplasm of breast: Secondary | ICD-10-CM

## 2012-09-19 DIAGNOSIS — N6099 Unspecified benign mammary dysplasia of unspecified breast: Secondary | ICD-10-CM

## 2012-09-19 DIAGNOSIS — D0590 Unspecified type of carcinoma in situ of unspecified breast: Secondary | ICD-10-CM

## 2012-09-19 DIAGNOSIS — D0501 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: Secondary | ICD-10-CM | POA: Insufficient documentation

## 2012-09-19 HISTORY — DX: Lobular carcinoma in situ of unspecified breast: D05.00

## 2012-09-19 MED ORDER — TAMOXIFEN CITRATE 20 MG PO TABS: 20.0000 mg | ORAL_TABLET | Freq: Every day | ORAL | Status: DC

## 2012-09-19 NOTE — Progress Notes (Signed)
[facility]  High Risk Clinic New Patient Evaluation  Name: Alicia Coffey            Date: 09/19/2012 MRN: [id number]                DOB: 02-Jan-1971  CC:  Dr. Cyndia Bent Dr. Jacquelin Hawking  REFERRING PHYSICIAN: Dr. 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  HISTORY OF PRESENT ILLNESS: Alicia Coffey is a 41 y.o. female here for for initial visit in the high-risk clinic. She recently had a mammogram performed that showed abnormality in the right breast. Because of this she went on to have a needle core biopsy performed that showed only atypical lobular hyperplasia. She was seen by Dr. Cyndia Bent who performed a lumpectomy on 08/15/2012. The final pathology of the right lumpectomy showed lobular neoplasia (lobular carcinoma in situ) with calcifications fibrocystic changes with calcifications. Margins were negative. Postoperatively she is doing well and she is seen in the high-risk clinic for discussion of risk reduction for future breast cancers. 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  PAST SURGICAL HISTORY:  Past Surgical History  Procedure Date  . No past surgeries   . Breast lumpectomy with needle localization 08/15/2012    Procedure: BREAST LUMPECTOMY WITH NEEDLE LOCALIZATION;  Surgeon: Currie Paris, MD;  Location: [facility];  Service: General;  Laterality: Right;  Wire localizations Right breast calcifications  . Breast surgery       CURRENT MEDICATIONS: Ms. Wickersham does not currently have  medications on file.  ALLERGIES: Review of patient's allergies indicates no known allergies.  SOCIAL HISTORY:  reports that she has never smoked. She has never used smokeless tobacco. She reports that she does not drink alcohol or use illicit drugs.  HEALTH HABITS: Vitamins:vitamin D3,  Supplements: fish oil flax seed oil supplement and vitamin E Alternative Therapies: no  Adverse environmental exposure: second hand smoke exposure Servings of fruit and vegetables/day: 2servings Servings of meat/day: 3 servings a day Exercises regularly:   none              Min/wk: Smoker/nonsmoker: none Alcohol:none Number of alcoholic beverages/week: none  REPRODUCTIVE HISTORY:  Menarche age:49 - 8 years Gravida: none      Para:none First Live Birth:none Number of live births:none Breast fed: Y/N  # months N/A Took fertility meds:                     Type:  Menses:  Oral Contraceptives:         # of years Menopause: natural/surgical  Age HRT Y/N Currently Y/N TType:                                   # years Sexually transmitted disease:    FAMILY HISTORY:  family history includes Breast cancer in her paternal grandmother; Diabetes in her brother, father, mother, and sisters; Heart disease in her father and mother; and Hypertension in her father and mother. Great maternal aunt with colon,  HEALTH MAINTENANCE: Last mammogram: February and September 2013 Last clinical breast exam: September 2013 Performs self breast exam:yes Last Pap Smear:1012 Colonoscopy: none Last skin exam: none  REVIEW OF SYSTEMS:  General: Negative for fever, chills, night sweats,  loss of appetite or weight loss. HEENT: Negative for headaches, sore  throat, difficulty swallowing, blurred vision or problem with hearing or  sinus congestion. Respiratory: Negative for shortness of breath, cough  or dyspnea on exertion. Cardiovascular: Negative for chest pain,  palpitations or pedal edema. GI: Negative for nausea,  vomiting,  diarrhea, constipation, change in bowel habits or blood in the stool.  No jaundice. GU: Negative for painful or frequent urination, change in  color of urine, or decreased urinary stream. Integumentary: Negative  for skin rashes or other suspicious skin lesions. Hematologic: Negative  for easy bruisability or bleeding. Musculoskeletal: Negative for  complaints of pain, arthralgias, arthritis or myalgias.  Neurological/psychiatric: Negative for numbness, focal weakness,  balance problems or coordination difficulties. No depression or mood swings.  Breast: No self detected abnormalities in the breast. No nipple discharge, masses or redness of the skin.   PHYSICAL EXAM: BP 123/76  Pulse 79  Temp 98.7 F (37.1 C) (Oral)  Resp 20  Ht 5\' 3"  (1.6 m)  Wt 248 lb 14.4 oz (112.9 kg)  BMI 44.09 kg/m2 GENERAL: Well developed, well nourished, in no acute distress.  EENT: No ocular or oral lesions. No stomatitis.  RESPIRATORY: Lungs are clear to auscultation bilaterally with normal respiratory movement and no accessory muscle use. CARDIAC: No murmur, rub or tachycardia. No upper or lower extremity edema.  GI: Abdomen is soft, no palpable hepatosplenomegaly. No fluid wave. No tenderness. Musculoskeletal: No kyphosis, no tenderness over the spine, ribs or hips. Lymph: No cervical, infraclavicular, axillary or inguinal adenopathy. Neuro: No focal neurological deficits. Psych: Alert and oriented X 3, appropriate mood and affect.  BREAST EXAM: In the supine position, with the right arm over the head, right nipple is everted. No periareolar edema or nipple discharge. No mass in any quadrant or subareolar region. No redness of the skin. No right axillary adenopathy. The recent surgical resection site looks well-healed.. With the left arm over the head, left nipple is everted. No periareolar edema or nipple discharge. No mass in any quadrant or subareolar region. No redness of the skin. No left  axillary adenopathy.    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 Postoperatively she is doing well. Her family history is significant for a paternal grandmother with history of breast cancer no other malignancies are noted. There is no history of ovarian cancer uterine cancer or colon cancer.  #2 Patient and I discussed risk reducing strategies including chemoprevention with tamoxifen 20 mg daily. We also discussed lifestyle modifications including exercise increasing to 5-6 days a week with cardio as well as strength training. We discussed weight reduction and the rationale for it. I do think that she would benefit from nutritional counseling. We discussed increasing intake of fruits and vegetables and reducing intake of red meats and alcohol consumption.  #3 we discussed ongoing surveillance mammograms as well as clinical breast examinations and doing self breast examinations.  #4 patient may need referral to genetic counseling and this will be made  PLAN:  #1 patient has agreed to go on tamoxifen 20 mg daily risks and benefits of tamoxifen were discussed with the patient a prescription was sent to her pharmacy.  #2  we discussed diet and exercise and laid out a plan for her in terms of eating healthy and continuing to exercise every day at least for 30 minutes.  #3 we discussed nutrition including increasing fruits and vegetables and reducing processed foods.  #4 she will continue surveillance mammograms doing self breast examinations.  #5 I will plan on seeing her back in 3 months time in followup.

## 2012-09-19 NOTE — Patient Instructions (Addendum)
We discussed your pathology and high risk lesions.  We discussed breast cancer risk reduction with tamoxifen  Tamoxifen oral tablet What is this medicine? TAMOXIFEN (ta MOX i fen) blocks the effects of estrogen. It is commonly used to treat breast cancer. It is also used to decrease the chance of breast cancer coming back in women who have received treatment for the disease. It may also help prevent breast cancer in women who have a high risk of developing breast cancer. This medicine may be used for other purposes; ask your health care provider or pharmacist if you have questions. What should I tell my health care provider before I take this medicine? They need to know if you have any of these conditions: -blood clots -blood disease -cataracts or impaired eyesight -endometriosis -high calcium levels -high cholesterol -irregular menstrual cycles -liver disease -stroke -uterine fibroids -an unusual or allergic reaction to tamoxifen, other medicines, foods, dyes, or preservatives -pregnant or trying to get pregnant -breast-feeding How should I use this medicine? Take this medicine by mouth with a glass of water. Follow the directions on the prescription label. You can take it with or without food. Take your medicine at regular intervals. Do not take your medicine more often than directed. Do not stop taking except on your doctor's advice. A special MedGuide will be given to you by the pharmacist with each prescription and refill. Be sure to read this information carefully each time. Talk to your pediatrician regarding the use of this medicine in children. While this drug may be prescribed for selected conditions, precautions do apply. Overdosage: If you think you have taken too much of this medicine contact a poison control center or emergency room at once. NOTE: This medicine is only for you. Do not share this medicine with others. What if I miss a dose? If you miss a dose, take it as  soon as you can. If it is almost time for your next dose, take only that dose. Do not take double or extra doses. What may interact with this medicine? -aminoglutethimide -bromocriptine -chemotherapy drugs -female hormones, like estrogens and birth control pills -letrozole -medroxyprogesterone -phenobarbital -rifampin -warfarin This list may not describe all possible interactions. Give your health care provider a list of all the medicines, herbs, non-prescription drugs, or dietary supplements you use. Also tell them if you smoke, drink alcohol, or use illegal drugs. Some items may interact with your medicine. What should I watch for while using this medicine? Visit your doctor or health care professional for regular checks on your progress. You will need regular pelvic exams, breast exams, and mammograms. If you are taking this medicine to reduce your risk of getting breast cancer, you should know that this medicine does not prevent all types of breast cancer. If breast cancer or other problems occur, there is no guarantee that it will be found at an early stage. Do not become pregnant while taking this medicine or for 2 months after stopping this medicine. Stop taking this medicine if you get pregnant or think you are pregnant and contact your doctor. This medicine may harm your unborn baby. Women who can possibly become pregnant should use birth control methods that do not use hormones during tamoxifen treatment and for 2 months after therapy has stopped. Talk with your health care provider for birth control advice. Do not breast feed while taking this medicine. What side effects may I notice from receiving this medicine? Side effects that you should report to your doctor  or health care professional as soon as possible: -changes in vision (blurred vision) -changes in your menstrual cycle -difficulty breathing or shortness of breath -difficulty walking or talking -new breast  lumps -numbness -pelvic pain or pressure -redness, blistering, peeling or loosening of the skin, including inside the mouth -skin rash or itching (hives) -sudden chest pain -swelling of lips, face, or tongue -swelling, pain or tenderness in your calf or leg -unusual bruising or bleeding -vaginal discharge that is bloody, brown, or rust -weakness -yellowing of the whites of the eyes or skin Side effects that usually do not require medical attention (report to your doctor or health care professional if they continue or are bothersome): -fatigue -hair loss, although uncommon and is usually mild -headache -hot flashes -impotence (in men) -nausea, vomiting (mild) -vaginal discharge (white or clear) This list may not describe all possible side effects. Call your doctor for medical advice about side effects. You may report side effects to FDA at 1-800-FDA-1088. Where should I keep my medicine? Keep out of the reach of children. Store at room temperature between 20 and 25 degrees C (68 and 77 degrees F). Protect from light. Keep container tightly closed. Throw away any unused medicine after the expiration date. NOTE: This sheet is a summary. It may not cover all possible information. If you have questions about this medicine, talk to your doctor, pharmacist, or health care provider.  2012, [employer]vier/[employer] Standard. (06/14/2008 12:01:56 PM)

## 2012-09-19 NOTE — Telephone Encounter (Signed)
Genetics counseling appointment on 09-26-2012  01-09-2013 lab and md

## 2012-09-20 MED ORDER — LIDOCAINE HCL 1 % IJ SOLN
INTRAMUSCULAR | Status: AC
  Filled 2012-09-20: qty 20

## 2012-09-26 ENCOUNTER — Ambulatory Visit (HOSPITAL_BASED_OUTPATIENT_CLINIC_OR_DEPARTMENT_OTHER): Payer: Medicaid Other | Admitting: Genetic Counselor

## 2012-09-26 ENCOUNTER — Other Ambulatory Visit: Payer: Self-pay | Admitting: Lab

## 2012-09-26 ENCOUNTER — Encounter: Payer: Self-pay | Admitting: Genetic Counselor

## 2012-09-26 DIAGNOSIS — Z803 Family history of malignant neoplasm of breast: Secondary | ICD-10-CM

## 2012-09-26 NOTE — Progress Notes (Signed)
Dr.  Drue Second requested a consultation for genetic counseling and risk assessment for Alicia Coffey, a 41 y.o. female, for discussion of her family history of breast cancer. She presents to clinic today to discuss the possibility of a genetic predisposition to cancer, and to further clarify her risks, as well as her family members' risks for cancer.   HISTORY OF PRESENT ILLNESS: Alicia Coffey is a 41 y.o. female with no personal history of cancer.    Past Medical History  Diagnosis Date  . Hyperlipidemia   . Hypertension     under control, has been on med. > 1 yr.  . Diabetes mellitus     IDDM  . 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 Arthritis     right knee  . Wears dentures     upper  . Wears partial dentures     lower  . 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 No past surgeries   . Breast lumpectomy with needle localization 08/15/2012    Procedure: BREAST LUMPECTOMY WITH NEEDLE LOCALIZATION;  Surgeon: Currie Paris, MD;  [facility];  Service: General;  Laterality: Right;  Wire localizations Right breast calcifications  . Breast surgery     History  Substance Use Topics  . Smoking status: Never Smoker   . Smokeless tobacco: Never Used  . Alcohol Use: No    REPRODUCTIVE HISTORY AND PERSONAL RISK ASSESSMENT FACTORS: Menarche was at age 9-11.   Premenopausal Uterus Intact: Yes Ovaries Intact: Yes [id number] , first live birth at age N/A  She has not previously undergone treatment for infertility.   Never used OCPs   She has not used HRT in the past.    FAMILY HISTORY:  We obtained a detailed, 4-generation family history.  Significant diagnoses are listed below: Family History  Problem Relation Age of Onset  . Breast cancer Paternal Grandmother 73    breast; dbl mastectomy  . Diabetes Father   . Heart disease Father   . Hypertension Father   . Heart attack Father    . Diabetes Mother   . Heart disease Mother   . Hypertension Mother   . Diabetes Brother   . Diabetes Sister   . Fibromyalgia Sister   . Heart attack Maternal Grandmother     <35  . Heart attack Maternal Grandfather   . Heart attack Paternal Grandfather   The patient's paternal grandmother had breast cancer at age 30 and underwent a double mastectomy.  There is no other reported cancer history of either side of her family.  Patient's maternal ancestors are of [place] descent, and paternal ancestors are of [place] and [place] descent. There is no reported Ashkenazi Jewish ancestry. There is no  known consanguinity.  GENETIC COUNSELING RISK ASSESSMENT, DISCUSSION, AND SUGGESTED FOLLOW UP: We reviewed the natural history and genetic etiology of sporadic, familial and hereditary cancer syndromes.  About 5-10% of breast cancer is hereditary.  Of this, about 85% is the result of a BRCA1 or BRCA2 mutation.  We reviewed the red flags of hereditary cancer syndromes and the dominant inheritance patterns. We discussed that there are other hereditary cancer genes that would be missed by only testing BRCA1 and BRCA2, although the likelihood of this happening is low.  The patient does not have medical insurance, therefore she is applying for [employer]'s financial assistance program.  The patient's family  history is suggestive of the following possible diagnosis: hereditary cancer syndrome vs. Sporadic cancer  We discussed that identification of a hereditary cancer syndrome may help her care providers tailor the patients medical management. If a mutation indicating a hereditary cancer syndrome is detected in this case, the [employer] recommendations would include increased cancer surveillance and possible prophylacitic surgery. If a mutation is detected, the patient will be referred back to the referring provider and to any additional appropriate care providers to  discuss the relevant options.   If a mutation is not found in the patient, cancer surveillance options would be discussed for the patient according to the appropriate standard National Comprehensive Cancer Network and American Cancer Society guidelines, with consideration of their personal and family history risk factors. In this case, the patient will be referred back to their care providers for discussions of management. We discussed the limitations to testing including the lack of technology to find certain mutations at this time.  Additionally, the entire gene is not sequenced, therefore rare mutations could be missed.  In order to estimate her chance of having a BRCA mutation, we used statistical models (Penn II) and laboratory data that take into account her personal medical history, family history and ancestry.  Because each model is different, there can be a lot of variability in the risks they give.  Therefore, these numbers must be considered a rough range and not a precise risk of having a BRCA mutation.  These models estimate that she has approximately a 3% chance of having a mutation. Based on this assessment of her family and personal history, genetic testing is recommended.  After considering the risks, benefits, and limitations, the patient provided informed consent for  the following  testing: [facility] through [employer].   Per the patient's request, we will contact her by telephone to discuss these results. A follow up genetic counseling visit will be scheduled if indicated.  The patient was seen for a total of 60 minutes, greater than 50% of which was spent face-to-face counseling.  This plan is being carried out per Dr. Feliz Beam recommendations.  This note will also be sent to the referring provider via the electronic medical record. The patient will be supplied with a summary of this genetic counseling discussion as well as educational information on the discussed  hereditary cancer syndromes following the conclusion of their visit.   Patient was discussed with Dr. Drue Second.   _______________________________________________________________________ For Office Staff:  Number of people involved in session: 2 Was an Intern/ student involved with case: no

## 2012-10-11 ENCOUNTER — Encounter (HOSPITAL_COMMUNITY): Payer: Self-pay | Admitting: *Deleted

## 2012-10-11 ENCOUNTER — Emergency Department (HOSPITAL_COMMUNITY)
Admission: EM | Admit: 2012-10-11 | Discharge: 2012-10-11 | Disposition: A | Discharge status: Home / Self Care | Payer: Medicaid Other | Attending: Emergency Medicine | Admitting: Emergency Medicine

## 2012-10-11 DIAGNOSIS — W268XXA Contact with other sharp object(s), not elsewhere classified, initial encounter: Secondary | ICD-10-CM | POA: Insufficient documentation

## 2012-10-11 DIAGNOSIS — S61209A Unspecified open wound of unspecified finger without damage to nail, initial encounter: Secondary | ICD-10-CM | POA: Insufficient documentation

## 2012-10-11 DIAGNOSIS — E119 Type 2 diabetes mellitus without complications: Secondary | ICD-10-CM | POA: Insufficient documentation

## 2012-10-11 DIAGNOSIS — Z79899 Other long term (current) drug therapy: Secondary | ICD-10-CM | POA: Insufficient documentation

## 2012-10-11 DIAGNOSIS — Z8739 Personal history of other diseases of the musculoskeletal system and connective tissue: Secondary | ICD-10-CM | POA: Insufficient documentation

## 2012-10-11 DIAGNOSIS — Z794 Long term (current) use of insulin: Secondary | ICD-10-CM | POA: Insufficient documentation

## 2012-10-11 DIAGNOSIS — S61219A Laceration without foreign body of unspecified finger without damage to nail, initial encounter: Secondary | ICD-10-CM

## 2012-10-11 DIAGNOSIS — Y929 Unspecified place or not applicable: Secondary | ICD-10-CM | POA: Insufficient documentation

## 2012-10-11 DIAGNOSIS — E785 Hyperlipidemia, unspecified: Secondary | ICD-10-CM | POA: Insufficient documentation

## 2012-10-11 DIAGNOSIS — Z853 Personal history of malignant neoplasm of breast: Secondary | ICD-10-CM | POA: Insufficient documentation

## 2012-10-11 DIAGNOSIS — Z23 Encounter for immunization: Secondary | ICD-10-CM | POA: Insufficient documentation

## 2012-10-11 DIAGNOSIS — Y9389 Activity, other specified: Secondary | ICD-10-CM | POA: Insufficient documentation

## 2012-10-11 DIAGNOSIS — I10 Essential (primary) hypertension: Secondary | ICD-10-CM | POA: Insufficient documentation

## 2012-10-11 MED ORDER — TETANUS-DIPHTH-ACELL PERTUSSIS 5-2.5-18.5 LF-MCG/0.5 IM SUSP
0.5000 mL | Freq: Once | INTRAMUSCULAR | Status: AC
  Administered 2012-10-11: 0.5 mL via INTRAMUSCULAR
  Filled 2012-10-11: qty 0.5

## 2012-10-11 NOTE — ED Notes (Signed)
Ortho tech at bedside 

## 2012-10-11 NOTE — ED Notes (Signed)
Paged ortho 

## 2012-10-11 NOTE — ED Notes (Signed)
Reports cutting right middle finger while opening a can.  Bleeding controlled.Tetanus unknown.

## 2012-10-11 NOTE — ED Provider Notes (Signed)
History   This chart was scribed for Alicia Roots, MD by Charolett Bumpers, ED Scribe. The patient was seen in room TR11C/TR11C. Patient's care was started at 1655.   CSN: [id number]  Arrival date & time 10/11/12  1638   First MD Initiated Contact with Patient 10/11/12 1655      Chief Complaint  Patient presents with  . Laceration    The history is provided by the patient. No language interpreter was used.  Alicia Coffey is a 41 y.o. female who presents to the Emergency Department complaining of laceration to her right middle finger that occurred prior to arrival. She states that she was opening a can when she accidentally sliced her finger. She denies any numbness, fevers, vomiting. Bleeding is controlled here in ED. Tetanus is unknown. She denies any other injuries. She has a h/o DM.no numbness no other injury.    Past Medical History  Diagnosis Date  . Hyperlipidemia   . Hypertension     under control, has been on med. > 1 yr.  . Diabetes mellitus     IDDM  . 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 Arthritis     right knee  . Wears dentures     upper  . Wears partial dentures     lower  . 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 No past surgeries   . Breast lumpectomy with needle localization 08/15/2012    Procedure: BREAST LUMPECTOMY WITH NEEDLE LOCALIZATION;  Surgeon: Currie Paris, MD;  Location: [facility];  Service: General;  Laterality: Right;  Wire localizations Right breast calcifications  . Breast surgery     Family History  Problem Relation Age of Onset  . Breast cancer Paternal Grandmother 28    breast; dbl mastectomy  . Diabetes Father   . Heart disease Father   . Hypertension Father   . Heart attack Father   . Diabetes Mother   . Heart disease Mother   . Hypertension Mother   . Diabetes Brother   . Diabetes Sister   . Fibromyalgia Sister   . Heart attack  Maternal Grandmother     <35  . Heart attack Maternal Grandfather   . Heart attack Paternal Grandfather     History  Substance Use Topics  . Smoking status: Never Smoker   . Smokeless tobacco: Never Used  . Alcohol Use: No    OB History    Grav Para Term Preterm Abortions TAB SAB Ect Mult Living   0               Review of Systems  Constitutional: Negative for fever.  Gastrointestinal: Negative for vomiting.  Skin: Positive for wound.  Neurological: Negative for numbness.  All other systems reviewed and are negative.    Allergies  Review of patient's allergies indicates no known allergies.  Home Medications   Current Outpatient Rx  Name  Route  Sig  Dispense  Refill  . VITAMIN D-3 1000 UNITS PO CAPS   Oral   Take 1 Units by mouth 2 (two) times daily.         . OMEGA-3 FATTY ACIDS 1000 MG PO CAPS   Oral   Take 2 g by mouth daily.         Marland Kitchen GLIPIZIDE 10 MG PO TABS   Oral   Take 10 mg by mouth 2 (two)  times daily before a meal.         . HYDROCODONE-ACETAMINOPHEN 5-325 MG PO TABS   Oral   Take 1 tablet by mouth every 4 (four) hours as needed for pain.   30 tablet   0   . INSULIN ASPART 100 UNIT/ML [place] SOLN   Subcutaneous   Inject into the skin 3 (three) times daily before meals. Sliding scale         . INSULIN GLARGINE 100 UNIT/ML  SOLN   Subcutaneous   Inject 20 Units into the skin at bedtime.          Marland Kitchen LISINOPRIL-HYDROCHLOROTHIAZIDE 20-25 MG PO TABS   Oral   Take 1 tablet by mouth 2 (two) times daily.         Marland Kitchen METFORMIN HCL 1000 MG PO TABS   Oral   Take 1,000 mg by mouth 2 times daily at 12 noon and 4 pm.         . PRAVASTATIN SODIUM 40 MG PO TABS   Oral   Take 80 mg by mouth at bedtime.          . TAMOXIFEN CITRATE 20 MG PO TABS   Oral   Take 1 tablet (20 mg total) by mouth daily.   30 tablet   12   . VITAMIN E 400 UNITS PO CAPS   Oral   Take 400 Units by mouth daily.           BP 144/86  Pulse 95  Temp 98.8  F (37.1 C) (Oral)  Resp 18  SpO2 97%  LMP 09/28/2012  Physical Exam  Nursing note and vitals reviewed. Constitutional: She is oriented to person, place, and time. She appears well-developed and well-nourished. No distress.  HENT:  Head: Normocephalic and atraumatic.  Eyes: Conjunctivae normal are normal. No scleral icterus.  Neck: Neck supple. No tracheal deviation present.  Cardiovascular: Normal rate.   Pulmonary/Chest: Effort normal. No respiratory distress.  Abdominal: Soft. There is no tenderness.  Musculoskeletal: Normal range of motion.       1.5 cm laceration to the dorsal aspect of right middle finger at the DIP joint. No bone exposed. Normal cap refill distally, able to fully flex/extend at dip.   Neurological: She is alert and oriented to person, place, and time. No cranial nerve deficit.  Skin: Skin is warm and dry. No rash noted.  Psychiatric: She has a normal mood and affect. Her behavior is normal.    ED Course  Procedures (including critical care time)  DIAGNOSTIC STUDIES: Oxygen Saturation is 97% on room air, normal by my interpretation.    COORDINATION OF CARE:  17:12-Discussed planned course of treatment with the patient including suturing her wound and updating her Tetanus, who is agreeable at this time.   17:15-Medication Orders: TDap (Boostrix) injection 0.5 mL-once.      MDM  I personally performed the services described in this documentation, which was scribed in my presence. The recorded information has been reviewed and is accurate.   LACERATION REPAIR Performed by: Alicia Coffey Authorized by: Alicia Coffey Consent: Verbal consent obtained. Risks and benefits: risks, benefits and alternatives were discussed Consent given by: patient Patient identity confirmed: provided demographic data Prepped and Draped in normal sterile fashion Wound explored  Laceration Location: right middle finger  Laceration Length: 1.5cm  No Foreign Bodies  seen or palpated  Anesthesia: local infiltration  Local anesthetic: lidocaine 2% wo epinephrine  Anesthetic total: 2 ml  Irrigation method:  syringe Amount of cleaning: standard  Skin closure: 4-0 prolene  Number of sutures: 3  Technique: simple interrupted  Patient tolerance: Patient tolerated the procedure well with no immediate complications.   Tetanus im.   Finger splint.     Alicia Roots, MD 10/11/12 [phone]

## 2012-10-11 NOTE — Progress Notes (Signed)
Orthopedic Tech Progress Note Patient Details:  Alicia Coffey [birth year] [id number]  Ortho Devices Type of Ortho Device: Finger splint Ortho Device/Splint Location: (R) UE Ortho Device/Splint Interventions: Application   Jennye Moccasin 10/11/2012, 5:54 PM

## 2012-10-11 NOTE — ED Notes (Signed)
Pt ambulatory leaving ED with friend. Pt given d/c teaching and education on wound care and follow up for suture removal. Pt verbalized understanding and had no further questions upon d/c. Pt does not appear to be in acute distress upon d/c.

## 2012-10-12 HISTORY — PX: BREAST BIOPSY: SHX20

## 2012-10-14 ENCOUNTER — Telehealth: Payer: Self-pay | Admitting: Medical Oncology

## 2012-10-14 ENCOUNTER — Encounter: Payer: Self-pay | Admitting: Genetic Counselor

## 2012-10-14 NOTE — Telephone Encounter (Signed)
Pt called requesting information on whether Tamoxifen increases blood sugar. Per MD, returned pt's call and informed her that it does not and that pt should follow up with her PCP. Pt stated she has an up coming appt to have that assessed with her PCP. Pt expressed thanks for returning call. No further questions at this time.

## 2012-11-04 ENCOUNTER — Other Ambulatory Visit (HOSPITAL_COMMUNITY): Payer: Self-pay | Admitting: Physician Assistant

## 2012-11-04 ENCOUNTER — Ambulatory Visit (HOSPITAL_COMMUNITY)
Admission: RE | Admit: 2012-11-04 | Discharge: 2012-11-04 | Disposition: A | Discharge status: Home / Self Care | Payer: Medicaid Other | Source: Ambulatory Visit | Attending: Physician Assistant | Admitting: Physician Assistant

## 2012-11-04 DIAGNOSIS — E11610 Type 2 diabetes mellitus with diabetic neuropathic arthropathy: Secondary | ICD-10-CM

## 2012-11-04 DIAGNOSIS — M7730 Calcaneal spur, unspecified foot: Secondary | ICD-10-CM | POA: Insufficient documentation

## 2012-11-04 DIAGNOSIS — E1149 Type 2 diabetes mellitus with other diabetic neurological complication: Secondary | ICD-10-CM | POA: Insufficient documentation

## 2012-11-30 ENCOUNTER — Other Ambulatory Visit: Payer: Self-pay | Admitting: Internal Medicine

## 2012-11-30 DIAGNOSIS — Z853 Personal history of malignant neoplasm of breast: Secondary | ICD-10-CM

## 2012-11-30 DIAGNOSIS — Z1231 Encounter for screening mammogram for malignant neoplasm of breast: Secondary | ICD-10-CM

## 2012-12-08 ENCOUNTER — Ambulatory Visit
Admission: RE | Admit: 2012-12-08 | Discharge: 2012-12-08 | Disposition: A | Discharge status: Home / Self Care | Payer: Medicaid Other | Source: Ambulatory Visit | Attending: Internal Medicine | Admitting: Internal Medicine

## 2012-12-08 ENCOUNTER — Other Ambulatory Visit: Payer: Self-pay | Admitting: Internal Medicine

## 2012-12-08 ENCOUNTER — Other Ambulatory Visit (INDEPENDENT_AMBULATORY_CARE_PROVIDER_SITE_OTHER): Payer: Self-pay | Admitting: Surgery

## 2012-12-08 DIAGNOSIS — Z853 Personal history of malignant neoplasm of breast: Secondary | ICD-10-CM

## 2012-12-08 DIAGNOSIS — N6320 Unspecified lump in the left breast, unspecified quadrant: Secondary | ICD-10-CM

## 2012-12-09 ENCOUNTER — Other Ambulatory Visit (INDEPENDENT_AMBULATORY_CARE_PROVIDER_SITE_OTHER): Payer: Self-pay | Admitting: Surgery

## 2012-12-09 DIAGNOSIS — N63 Unspecified lump in unspecified breast: Secondary | ICD-10-CM

## 2012-12-12 ENCOUNTER — Other Ambulatory Visit (INDEPENDENT_AMBULATORY_CARE_PROVIDER_SITE_OTHER): Payer: Self-pay | Admitting: Surgery

## 2012-12-12 ENCOUNTER — Other Ambulatory Visit: Payer: Medicaid Other

## 2012-12-12 ENCOUNTER — Ambulatory Visit
Admission: RE | Admit: 2012-12-12 | Discharge: 2012-12-12 | Disposition: A | Discharge status: Home / Self Care | Payer: Medicaid Other | Source: Ambulatory Visit | Attending: Surgery | Admitting: Surgery

## 2012-12-12 DIAGNOSIS — N63 Unspecified lump in unspecified breast: Secondary | ICD-10-CM

## 2012-12-15 ENCOUNTER — Telehealth (HOSPITAL_COMMUNITY): Payer: Self-pay | Admitting: *Deleted

## 2012-12-15 NOTE — Telephone Encounter (Signed)
Patient called and left me a voicemail and I returned her call. Patient was calling in regards to her [facility] Medicaid. Patient is not eligible for renewal since not in active treatment. Encouraged patient to apply for regular Medicaid and call one of our financial counselors. Patient needs follow up in 6 months. Told her to call us before her mammogram is due to schedule an appointment with [facility] to renew her [facility] card to get her mammogram covered through [facility]. Patient verbalized understanding.

## 2012-12-20 ENCOUNTER — Encounter: Payer: Self-pay | Admitting: Oncology

## 2012-12-20 NOTE — Progress Notes (Signed)
Patient called back. I advised to reapply on line or go direct to DSS for Adult medicaid application. I told her she still has our discount thru July 2014.

## 2012-12-20 NOTE — Progress Notes (Signed)
Patient left message for me to call her back. See prev notes from [place]. She is going to have to apply for medicaid. She already has discount with [place] thru July 2014. I called and left her a message to call me back. I will tell her she has our discount already, but still needs to make sure she applies for medicaid DSS.

## 2013-01-09 ENCOUNTER — Encounter: Payer: Self-pay | Admitting: Oncology

## 2013-01-09 ENCOUNTER — Other Ambulatory Visit (HOSPITAL_BASED_OUTPATIENT_CLINIC_OR_DEPARTMENT_OTHER): Payer: Medicaid Other | Admitting: Lab

## 2013-01-09 ENCOUNTER — Ambulatory Visit (HOSPITAL_BASED_OUTPATIENT_CLINIC_OR_DEPARTMENT_OTHER): Payer: Medicaid Other | Admitting: Oncology

## 2013-01-09 ENCOUNTER — Telehealth: Payer: Self-pay | Admitting: *Deleted

## 2013-01-09 VITALS — BP 125/85 | HR 81 | Temp 98.1°F | Resp 20 | Ht 63.0 in | Wt 243.4 lb

## 2013-01-09 DIAGNOSIS — D0590 Unspecified type of carcinoma in situ of unspecified breast: Secondary | ICD-10-CM

## 2013-01-09 DIAGNOSIS — N62 Hypertrophy of breast: Secondary | ICD-10-CM

## 2013-01-09 DIAGNOSIS — N6099 Unspecified benign mammary dysplasia of unspecified breast: Secondary | ICD-10-CM

## 2013-01-09 DIAGNOSIS — D0500 Lobular carcinoma in situ of unspecified breast: Secondary | ICD-10-CM

## 2013-01-09 LAB — COMPREHENSIVE METABOLIC PANEL (CC13)
AST: 19 U/L (ref 5–34)
Alkaline Phosphatase: 56 U/L (ref 40–150)
Glucose: 169 mg/dl — ABNORMAL HIGH (ref 70–99)
Sodium: 140 mEq/L (ref 136–145)
Total Bilirubin: 0.42 mg/dL (ref 0.20–1.20)
Total Protein: 6.6 g/dL (ref 6.4–8.3)

## 2013-01-09 LAB — CBC WITH DIFFERENTIAL/PLATELET
BASO%: 0.6 % (ref 0.0–2.0)
EOS%: 2.8 % (ref 0.0–7.0)
Eosinophils Absolute: 0.2 10*3/uL (ref 0.0–0.5)
LYMPH%: 39 % (ref 14.0–49.7)
MCH: 28.4 pg (ref 25.1–34.0)
MCHC: 33.4 g/dL (ref 31.5–36.0)
MCV: 85 fL (ref 79.5–101.0)
MONO%: 7.6 % (ref 0.0–14.0)
Platelets: 188 10*3/uL (ref 145–400)
RBC: 4.01 10*6/uL (ref 3.70–5.45)
RDW: 13.8 % (ref 11.2–14.5)

## 2013-01-09 NOTE — Patient Instructions (Addendum)
Continue tamoxifen as prevention for future breast cancers or atypical lesions  I will see you back in 8 months

## 2013-01-09 NOTE — Telephone Encounter (Signed)
appts made and printed 

## 2013-01-09 NOTE — Progress Notes (Signed)
OFFICE PROGRESS NOTE  CC**  [name], PA-C [facility] [address] [place] [address] Dr. 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 Because of this she went on to have a needle core biopsy performed that showed only atypical lobular hyperplasia. She was seen by Dr. Cyndia Bent who performed a lumpectomy on 08/15/2012. The final pathology of the right lumpectomy showed lobular neoplasia (lobular carcinoma in situ) with calcifications fibrocystic changes with calcifications. Margins were negative  #2 patient was seen in the high-risk clinic for discussion of risk reducing strategies including chemoprevention with tamoxifen 20 mg daily. We also had discussed lifestyle modifications including exercise increasing it to 5-6 days a week with cardio as well as strength training. We discussed weight reduction and the rationale for it. She also was counseled regarding nutritional counseling. We had discussed surveillance mammograms as well as clinical breast examinations and self breast examinations.   CURRENT THERAPY: tamoxifen 20 mg daily since December 2013.  INTERVAL HISTORY: Alicia Coffey 42 y.o. female returns for followup visit today. She has been taking the tamoxifen without any problems except for some hot flashes. She denies any nausea vomiting fevers chills night sweats no headaches no shortness of breath chest pains palpitations no vaginal bleeding or discharge. Remainder of the 10 point review of systems is negative.  MEDICAL HISTORY: Past Medical History  Diagnosis Date  . Hyperlipidemia   . Hypertension     under control, has been on med. > 1 yr.  . Diabetes mellitus     IDDM  . 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 Arthritis     right knee  . Wears dentures     upper  . Wears partial dentures     lower  . Lobular carcinoma in situ of breast 09/19/2012    ALLERGIES:  is allergic to other.  MEDICATIONS:  Current Outpatient Prescriptions  Medication Sig Dispense Refill  . fish oil-omega-3 fatty acids 1000 MG capsule Take 2 g by mouth daily.      . insulin aspart (NOVOLOG) 100 UNIT/ML injection Inject 1-4 Units into the skin 3 (three) times daily before meals. Sliding scale CBG: 101-150 - 1 unit, 151-201 2 units, 202-250 3 untis, 251- 301 4 units      . insulin glargine (LANTUS) 100 UNIT/ML injection Inject 20 Units into the skin at bedtime.       Marland Kitchen lisinopril-hydrochlorothiazide (PRINZIDE,ZESTORETIC) 20-25 MG per tablet Take 1 tablet by mouth 2 (two) times daily.      . metFORMIN (GLUCOPHAGE) 1000 MG tablet Take 1,000 mg by mouth 2 times daily at 12 noon and 4 pm.      . pravastatin (PRAVACHOL) 40 MG tablet Take 80 mg by mouth at bedtime.      . tamoxifen (NOLVADEX) 20 MG tablet Take 1 tablet (20 mg total) by mouth daily.  30 tablet  12  . Cholecalciferol (VITAMIN D3 PO) Take 2 capsules by mouth 2 (two) times daily.       No current facility-administered medications for this visit.    SURGICAL HISTORY:  Past Surgical History  Procedure Laterality Date  . No past surgeries    . Breast lumpectomy with needle localization  08/15/2012  Procedure: BREAST LUMPECTOMY WITH NEEDLE LOCALIZATION;  Surgeon: Currie Paris, MD;  Location: [facility];  Service: General;  Laterality: Right;  Wire localizations Right breast calcifications  . Breast surgery      REVIEW OF SYSTEMS:  A comprehensive review of systems was negative.   HEALTH MAINTENANCE:   PHYSICAL EXAMINATION: Blood pressure 125/85, pulse 81, temperature 98.1 F (36.7 C), temperature source Oral, resp. rate 20, height 5\' 3"  (1.6 m), weight 243 lb 6.4 oz (110.406 kg). Body mass index is 43.13  kg/(m^2). ECOG PERFORMANCE STATUS: 0 - Asymptomatic  Well-developed well-nourished female in no acute distress HEENT exam EOMI PERRLA sclerae anicteric no conjunctival pallor oral mucosa is moist neck is supple lungs are clear to auscultation cardiovascular is regular rate rhythm no murmurs gallops or rubs abdomen is soft nontender nondistended bowel sounds are present no HSM extremities no edema neuro patient's alert oriented otherwise nonfocal. Breast examination right breast no masses or nipple discharge, well-healed surgical scar is noted. Left breast no masses or nipple discharge.   LABORATORY DATA: Lab Results  Component Value Date   WBC 8.6 01/09/2013   HGB 11.4* 01/09/2013   HCT 34.1* 01/09/2013   MCV 85.0 01/09/2013   PLT 188 01/09/2013      Chemistry      Component Value Date/Time   NA 140 01/09/2013 0855   NA 138 08/11/2012 1130   K 4.3 01/09/2013 0855   K 4.4 08/11/2012 1130   CL 106 01/09/2013 0855   CL 101 08/11/2012 1130   CO2 25 01/09/2013 0855   CO2 27 08/11/2012 1130   BUN 18.6 01/09/2013 0855   BUN 16 08/11/2012 1130   CREATININE 0.8 01/09/2013 0855   CREATININE 0.66 08/11/2012 1130      Component Value Date/Time   CALCIUM 8.8 01/09/2013 0855   CALCIUM 9.4 08/11/2012 1130   ALKPHOS 56 01/09/2013 0855   AST 19 01/09/2013 0855   ALT 15 01/09/2013 0855   BILITOT 0.42 01/09/2013 0855       RADIOGRAPHIC STUDIES:  Mm Lt Breast Bx W Loc Dev 1st Lesion Image Bx Spec Stereo Guide  12/14/2012  **ADDENDUM** CREATED: 12/13/2012 13:19:43  Pathology revealed fibrocystic changes with calcifications in the left breast. This was found to be concordant by Dr. Britta Mccreedy. Pathology was relayed to the patient by telephone. The patient reported minimal tenderness at the biopsy site. Post biopsy instructions were reviewed and her questions were answered. She was encouraged to call [facility] for any additional concerns. She was asked to return in 6 months for  a diagnostic left mammogram.  Pathology results are dictated by Sonnie Alamo RN, BSN on December 13, 2012.  **END ADDENDUM** SIGNED BY: Dina L. Judyann Munson, M.D.   12/12/2012  *RADIOLOGY REPORT*  Clinical Data:  Suspicious left breast calcifications  STEREOTACTIC-GUIDED VACUUM ASSISTED BIOPSY OF THE LEFT BREAST AND SPECIMEN RADIOGRAPH  Comparison: Previous exams.  I met with the patient and we discussed the procedure of stereotactic-guided biopsy, including benefits and alternatives. We discussed the high likelihood of a successful procedure. We discussed the risks of the procedure, including infection, bleeding, tissue injury, clip migration, and inadequate sampling. Informed, written consent was given.  Using sterile technique, 2% lidocaine, stereotactic guidance, and a 9 gauge vacuum assisted device, biopsy was performed of calcifications in the medial aspect of the left breast using an inferior approach.  Specimen radiograph was performed, showing calcifications are present the tissue samples.  Specimens with  calcifications are identified for pathology.  At the conclusion of the procedure, a T-shaped tissue marker clip was deployed into the biopsy cavity.  Follow-up 2-view mammogram confirmed clip placement. The T shaped clip is located 2.8 cm anterior to the ribbon shaped clip.  IMPRESSION: Stereotactic-guided biopsy of the left breast.  No apparent complications.   Original Report Authenticated By: Baird Lyons, M.D.    US Breast Bx W Loc Dev 1st Lesion Img Bx Spec US Guide  12/14/2012  **ADDENDUM** CREATED: 12/13/2012 13:23:03  Pathology revealed fibrocystic changes with calcifications in the left breast. This was found to be concordant by Dr. Britta Mccreedy. Pathology was relayed to the patient by telephone. The patient reported minimal tenderness at the biopsy site. Post biopsy instructions were reviewed and her questions were answered. She was encouraged to call [facility] for any additional  concerns. She was asked to return in 6 months for a diagnostic left mammogram.  Pathology results are dictated by Sonnie Alamo RN, BSN on December 13, 2012.  **END ADDENDUM** SIGNED BY: Dina L. Judyann Munson, M.D.   12/12/2012  *RADIOLOGY REPORT*  Clinical Data:  Suspicious left breast mass  ULTRASOUND GUIDED VACUUM ASSISTED CORE BIOPSY OF THE LEFT BREAST  Comparison: Previous exams.  I met with the patient and we discussed the procedure of ultrasound- guided biopsy, including benefits and alternatives.  We discussed the high likelihood of a successful procedure. We discussed the risks of the procedure including infection, bleeding, tissue injury, clip migration, and inadequate sampling.  Informed written consent was given.  Using sterile technique, 2% lidocaine ultrasound guidance and a 12 gauge vacuum assisted needle biopsy was performed of the 9:30 region of the left breast using a lateral approach.  At the conclusion of the procedure, a ribbon shaped tissue marker clip was deployed into the biopsy cavity.  Follow-up 2-view mammogram was performed and dictated separately.  IMPRESSION: Ultrasound-guided biopsy of the left breast.  No apparent complications.   Original Report Authenticated By: Baird Lyons, M.D.     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 She is status post lumpectomy. She was sequentially seen in the high-risk clinic for discussion of future breast cancer risk reduction. She was discussed and has started tamoxifen 20 mg daily. Overall she's tolerating it well. She is also trying to exercise and eat healthy her to try to reduce her weight. We also today discussed stress reduction.   PLAN:   #1 patient will continue tamoxifen on a daily basis.  #2 I will plan on seeing her back in 8 months time for followup.   All questions were answered. The patient knows to call the clinic with any problems, questions or  concerns. We can certainly see the patient much sooner if necessary.  I spent 25 minutes counseling the patient face to face. The total time spent in the appointment was 30 minutes.    Drue Second, MD Medical/Oncology [facility] [phone] (beeper) [phone] (Office)  01/09/2013, 10:24 AM

## 2013-03-13 ENCOUNTER — Other Ambulatory Visit (HOSPITAL_COMMUNITY): Payer: Self-pay | Admitting: Physician Assistant

## 2013-03-13 DIAGNOSIS — M545 Low back pain, unspecified: Secondary | ICD-10-CM

## 2013-03-13 DIAGNOSIS — R531 Weakness: Secondary | ICD-10-CM

## 2013-03-15 ENCOUNTER — Ambulatory Visit (HOSPITAL_COMMUNITY)
Admission: RE | Admit: 2013-03-15 | Discharge: 2013-03-15 | Disposition: A | Discharge status: Home / Self Care | Payer: Self-pay | Source: Ambulatory Visit | Attending: Physician Assistant | Admitting: Physician Assistant

## 2013-03-15 DIAGNOSIS — R531 Weakness: Secondary | ICD-10-CM

## 2013-03-15 DIAGNOSIS — M545 Low back pain, unspecified: Secondary | ICD-10-CM | POA: Insufficient documentation

## 2013-03-15 DIAGNOSIS — M5126 Other intervertebral disc displacement, lumbar region: Secondary | ICD-10-CM | POA: Insufficient documentation

## 2013-03-15 DIAGNOSIS — R209 Unspecified disturbances of skin sensation: Secondary | ICD-10-CM | POA: Insufficient documentation

## 2013-03-15 DIAGNOSIS — M79609 Pain in unspecified limb: Secondary | ICD-10-CM | POA: Insufficient documentation

## 2013-06-01 ENCOUNTER — Other Ambulatory Visit: Payer: Self-pay | Admitting: *Deleted

## 2013-06-01 DIAGNOSIS — Z09 Encounter for follow-up examination after completed treatment for conditions other than malignant neoplasm: Secondary | ICD-10-CM

## 2013-06-06 ENCOUNTER — Ambulatory Visit (HOSPITAL_COMMUNITY)
Admission: RE | Admit: 2013-06-06 | Discharge: 2013-06-06 | Disposition: A | Discharge status: Home / Self Care | Payer: Self-pay | Source: Ambulatory Visit | Attending: Obstetrics and Gynecology | Admitting: Obstetrics and Gynecology

## 2013-06-06 ENCOUNTER — Encounter (HOSPITAL_COMMUNITY): Payer: Self-pay

## 2013-06-06 VITALS — BP 120/82 | Temp 98.1°F | Ht 63.0 in | Wt 242.2 lb

## 2013-06-06 DIAGNOSIS — Z1239 Encounter for other screening for malignant neoplasm of breast: Secondary | ICD-10-CM

## 2013-06-06 NOTE — Progress Notes (Signed)
Complaints of bilateral breast pinching and burning feeling at times.  Pap Smear:    Pap smear not completed today. Last Pap smear was March 2012 and normal per patient. Per patient no history of an abnormal Pap smear. No Pap smear results in EPIC.  Physical exam: Breasts Breasts symmetrical. No skin abnormalities left breast. Scar right breast from history of lumpectomy 08/15/2012. No nipple retraction bilateral breasts. No nipple discharge bilateral breasts. No lymphadenopathy. No lumps palpated bilateral breasts. No complaints of pain or tenderness on exam. Referred patient to [facility] for left breast diagnostic mammogram per recommendation. Appointment scheduled for Thursday, June 22, 2013 at 1100.     Pelvic/Bimanual No Pap smear completed today since last Pap smear was March 2012. Pap smear not indicated per [facility]CC[facility] guidelines.

## 2013-06-06 NOTE — Patient Instructions (Signed)
Taught Alicia Coffey how to perform BSE and gave educational materials to take home. Patient did not need a Pap smear today due to last Pap smear was March 2012 per patient. Let her know [facility] will cover Pap smears every 3 years unless has a history of abnormal Pap smears. Let her know she will be due for her next Pap smear in March 2015 and can come back to [facility] to have it completed. Referred patient to [facility] for left breast diagnostic mammogram per recommendation. Appointment scheduled for Thursday, June 22, 2013 at 1100. Patient aware of appointment and will be there. Alicia Coffey verbalized understanding.  Alicia Coffey, Alicia Maser, RN 1:26 PM

## 2013-06-21 ENCOUNTER — Other Ambulatory Visit: Payer: Self-pay | Admitting: Emergency Medicine

## 2013-06-21 MED ORDER — TAMOXIFEN CITRATE 20 MG PO TABS: 20.0000 mg | ORAL_TABLET | Freq: Every day | ORAL | Status: DC

## 2013-06-22 ENCOUNTER — Ambulatory Visit
Admission: RE | Admit: 2013-06-22 | Discharge: 2013-06-22 | Disposition: A | Discharge status: Home / Self Care | Payer: 59 | Source: Ambulatory Visit | Attending: Obstetrics and Gynecology | Admitting: Obstetrics and Gynecology

## 2013-06-22 ENCOUNTER — Encounter: Payer: Self-pay | Admitting: Oncology

## 2013-06-22 DIAGNOSIS — Z09 Encounter for follow-up examination after completed treatment for conditions other than malignant neoplasm: Secondary | ICD-10-CM

## 2013-06-22 NOTE — Progress Notes (Signed)
Patient came in and signed the grant form for 600.00 and 400.00. She is appealing medicaid and affordable care insurance-was denied. I gave her app for assistance also and advised to call and see if she has bill at the hospital and let them know that she doesn't have insurance now.

## 2013-09-11 ENCOUNTER — Ambulatory Visit (HOSPITAL_BASED_OUTPATIENT_CLINIC_OR_DEPARTMENT_OTHER): Payer: Self-pay | Admitting: Oncology

## 2013-09-11 ENCOUNTER — Telehealth: Payer: Self-pay | Admitting: Oncology

## 2013-09-11 ENCOUNTER — Encounter: Payer: Self-pay | Admitting: Oncology

## 2013-09-11 ENCOUNTER — Encounter (INDEPENDENT_AMBULATORY_CARE_PROVIDER_SITE_OTHER): Payer: Self-pay

## 2013-09-11 ENCOUNTER — Other Ambulatory Visit (HOSPITAL_BASED_OUTPATIENT_CLINIC_OR_DEPARTMENT_OTHER): Payer: Self-pay | Admitting: Lab

## 2013-09-11 VITALS — BP 114/74 | HR 89 | Temp 98.4°F | Resp 18 | Ht 63.0 in | Wt 243.2 lb

## 2013-09-11 DIAGNOSIS — D0500 Lobular carcinoma in situ of unspecified breast: Secondary | ICD-10-CM

## 2013-09-11 DIAGNOSIS — D0590 Unspecified type of carcinoma in situ of unspecified breast: Secondary | ICD-10-CM

## 2013-09-11 DIAGNOSIS — N6099 Unspecified benign mammary dysplasia of unspecified breast: Secondary | ICD-10-CM

## 2013-09-11 LAB — CBC WITH DIFFERENTIAL/PLATELET
BASO%: 0.6 % (ref 0.0–2.0)
Basophils Absolute: 0.1 10*3/uL (ref 0.0–0.1)
EOS%: 7 % (ref 0.0–7.0)
HCT: 30.5 % — ABNORMAL LOW (ref 34.8–46.6)
HGB: 9.9 g/dL — ABNORMAL LOW (ref 11.6–15.9)
LYMPH%: 33.2 % (ref 14.0–49.7)
MCH: 28.3 pg (ref 25.1–34.0)
MCHC: 32.3 g/dL (ref 31.5–36.0)
MCV: 87.4 fL (ref 79.5–101.0)
MONO%: 7.3 % (ref 0.0–14.0)
NEUT%: 51.9 % (ref 38.4–76.8)
lymph#: 3.7 10*3/uL — ABNORMAL HIGH (ref 0.9–3.3)

## 2013-09-11 LAB — COMPREHENSIVE METABOLIC PANEL (CC13)
ALT: 13 U/L (ref 0–55)
AST: 18 U/L (ref 5–34)
Albumin: 3.8 g/dL (ref 3.5–5.0)
Alkaline Phosphatase: 45 U/L (ref 40–150)
Anion Gap: 12 mEq/L — ABNORMAL HIGH (ref 3–11)
CO2: 24 mEq/L (ref 22–29)
Creatinine: 1.6 mg/dL — ABNORMAL HIGH (ref 0.6–1.1)
Glucose: 207 mg/dl — ABNORMAL HIGH (ref 70–140)
Potassium: 4.1 mEq/L (ref 3.5–5.1)
Total Bilirubin: 0.4 mg/dL (ref 0.20–1.20)
Total Protein: 7.1 g/dL (ref 6.4–8.3)

## 2013-09-11 NOTE — Progress Notes (Signed)
OFFICE PROGRESS NOTE  CC**  Alicia Ora, PA-C [facility] [address] [place] [address] Dr. 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 Because of this she went on to have a needle core biopsy performed that showed only atypical lobular hyperplasia. She was seen by Dr. Cyndia Bent who performed a lumpectomy on 08/15/2012. The final pathology of the right lumpectomy showed lobular neoplasia (lobular carcinoma in situ) with calcifications fibrocystic changes with calcifications. Margins were negative  #2 patient was seen in the high-risk clinic for discussion of risk reducing strategies including chemoprevention with tamoxifen 20 mg daily. We also had discussed lifestyle modifications including exercise increasing it to 5-6 days a week with cardio as well as strength training. We discussed weight reduction and the rationale for it. She also was counseled regarding nutritional counseling. We had discussed surveillance mammograms as well as clinical breast examinations and self breast examinations.   CURRENT THERAPY: tamoxifen 20 mg daily since December 2013.  INTERVAL HISTORY: Alicia Coffey 42 y.o. female returns for followup visit today. She has been taking the tamoxifen without any problems except for some hot flashes. She denies any nausea vomiting fevers chills night sweats no headaches no shortness of breath chest pains palpitations no vaginal bleeding or discharge. Remainder of the 10 point review of systems is negative.  MEDICAL HISTORY: Past Medical History  Diagnosis Date  . Hyperlipidemia   . Hypertension     under control, has been on med. > 1 yr.  . Diabetes mellitus     IDDM  . 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 Arthritis     right knee  . Wears dentures     upper  . Wears partial dentures     lower  . Lobular carcinoma in situ of breast 09/19/2012    ALLERGIES:  is allergic to other.  MEDICATIONS:  Current Outpatient Prescriptions  Medication Sig Dispense Refill  . Cholecalciferol (VITAMIN D3 PO) Take 2 capsules by mouth 2 (two) times daily.      . cyclobenzaprine (FLEXERIL) 10 MG tablet Take 10 mg by mouth at bedtime.      . fish oil-omega-3 fatty acids 1000 MG capsule Take 2 g by mouth daily.      . insulin aspart (NOVOLOG) 100 UNIT/ML injection Inject 1-4 Units into the skin 3 (three) times daily before meals. Sliding scale CBG: 101-150 - 1 unit, 151-201 2 units, 202-250 3 untis, 251- 301 4 units      . insulin glargine (LANTUS) 100 UNIT/ML injection Inject 20 Units into the skin at bedtime.       . IRON, FERROUS GLUCONATE, PO Take 65 mg by mouth daily.      Marland Kitchen lisinopril-hydrochlorothiazide (PRINZIDE,ZESTORETIC) 20-25 MG per tablet Take 1 tablet by mouth 2 (two) times daily.      . metFORMIN (GLUCOPHAGE) 1000 MG tablet Take 1,000 mg by mouth 2 times daily at 12 noon and 4 pm.      . rosuvastatin (CRESTOR) 40 MG tablet Take 40 mg by mouth daily.      . tamoxifen (NOLVADEX) 20 MG tablet Take 1 tablet (20 mg total) by mouth daily.  30 tablet  12  . traMADol (ULTRAM) 50 MG tablet  Take 50 mg by mouth every 6 (six) hours as needed.       No current facility-administered medications for this visit.    SURGICAL HISTORY:  Past Surgical History  Procedure Laterality Date  . No past surgeries    . Breast lumpectomy with needle localization  08/15/2012    Procedure: BREAST LUMPECTOMY WITH NEEDLE LOCALIZATION;  Surgeon: Currie Paris, MD;  Location: [facility];  Service: General;  Laterality: Right;  Wire localizations Right breast calcifications  . Breast surgery      REVIEW OF SYSTEMS:  A comprehensive review of systems was negative.   HEALTH  MAINTENANCE:   PHYSICAL EXAMINATION: Blood pressure 114/74, pulse 89, temperature 98.4 F (36.9 C), temperature source Oral, resp. rate 18, height 5\' 3"  (1.6 m), weight 243 lb 3.2 oz (110.315 kg). Body mass index is 43.09 kg/(m^2). ECOG PERFORMANCE STATUS: 0 - Asymptomatic  Well-developed well-nourished female in no acute distress HEENT exam EOMI PERRLA sclerae anicteric no conjunctival pallor oral mucosa is moist neck is supple lungs are clear to auscultation cardiovascular is regular rate rhythm no murmurs gallops or rubs abdomen is soft nontender nondistended bowel sounds are present no HSM extremities no edema neuro patient's alert oriented otherwise nonfocal. Breast examination right breast no masses or nipple discharge, well-healed surgical scar is noted. Left breast no masses or nipple discharge.   LABORATORY DATA: Lab Results  Component Value Date   WBC 11.3* 09/11/2013   HGB 9.9* 09/11/2013   HCT 30.5* 09/11/2013   MCV 87.4 09/11/2013   PLT 169 09/11/2013      Chemistry      Component Value Date/Time   NA 142 09/11/2013 0911   NA 138 08/11/2012 1130   K 4.1 09/11/2013 0911   K 4.4 08/11/2012 1130   CL 106 01/09/2013 0855   CL 101 08/11/2012 1130   CO2 24 09/11/2013 0911   CO2 27 08/11/2012 1130   BUN 34.4* 09/11/2013 0911   BUN 16 08/11/2012 1130   CREATININE 1.6* 09/11/2013 0911   CREATININE 0.66 08/11/2012 1130      Component Value Date/Time   CALCIUM 9.7 09/11/2013 0911   CALCIUM 9.4 08/11/2012 1130   ALKPHOS 45 09/11/2013 0911   AST 18 09/11/2013 0911   ALT 13 09/11/2013 0911   BILITOT 0.40 09/11/2013 0911       RADIOGRAPHIC STUDIES:  Mm Lt Breast Bx W Loc Dev 1st Lesion Image Bx Spec Stereo Guide  12/14/2012  **ADDENDUM** CREATED: 12/13/2012 13:19:43  Pathology revealed fibrocystic changes with calcifications in the left breast. This was found to be concordant by Dr. Britta Mccreedy. Pathology was relayed to the patient by telephone. The patient reported minimal  tenderness at the biopsy site. Post biopsy instructions were reviewed and her questions were answered. She was encouraged to call [facility] for any additional concerns. She was asked to return in 6 months for a diagnostic left mammogram.  Pathology results are dictated by Sonnie Alamo RN, BSN on December 13, 2012.  **END ADDENDUM** SIGNED BY: Dina L. Judyann Munson, M.D.   12/12/2012  *RADIOLOGY REPORT*  Clinical Data:  Suspicious left breast calcifications  STEREOTACTIC-GUIDED VACUUM ASSISTED BIOPSY OF THE LEFT BREAST AND SPECIMEN RADIOGRAPH  Comparison: Previous exams.  I met with the patient and we discussed the procedure of stereotactic-guided biopsy, including benefits and alternatives. We discussed the high likelihood of a successful procedure. We discussed the risks of the procedure, including infection, bleeding, tissue injury, clip  migration, and inadequate sampling. Informed, written consent was given.  Using sterile technique, 2% lidocaine, stereotactic guidance, and a 9 gauge vacuum assisted device, biopsy was performed of calcifications in the medial aspect of the left breast using an inferior approach.  Specimen radiograph was performed, showing calcifications are present the tissue samples.  Specimens with calcifications are identified for pathology.  At the conclusion of the procedure, a T-shaped tissue marker clip was deployed into the biopsy cavity.  Follow-up 2-view mammogram confirmed clip placement. The T shaped clip is located 2.8 cm anterior to the ribbon shaped clip.  IMPRESSION: Stereotactic-guided biopsy of the left breast.  No apparent complications.   Original Report Authenticated By: Baird Lyons, M.D.    US Breast Bx W Loc Dev 1st Lesion Img Bx Spec US Guide  12/14/2012  **ADDENDUM** CREATED: 12/13/2012 13:23:03  Pathology revealed fibrocystic changes with calcifications in the left breast. This was found to be concordant by Dr. Britta Mccreedy. Pathology was relayed to the  patient by telephone. The patient reported minimal tenderness at the biopsy site. Post biopsy instructions were reviewed and her questions were answered. She was encouraged to call [facility] for any additional concerns. She was asked to return in 6 months for a diagnostic left mammogram.  Pathology results are dictated by Sonnie Alamo RN, BSN on December 13, 2012.  **END ADDENDUM** SIGNED BY: Dina L. Judyann Munson, M.D.   12/12/2012  *RADIOLOGY REPORT*  Clinical Data:  Suspicious left breast mass  ULTRASOUND GUIDED VACUUM ASSISTED CORE BIOPSY OF THE LEFT BREAST  Comparison: Previous exams.  I met with the patient and we discussed the procedure of ultrasound- guided biopsy, including benefits and alternatives.  We discussed the high likelihood of a successful procedure. We discussed the risks of the procedure including infection, bleeding, tissue injury, clip migration, and inadequate sampling.  Informed written consent was given.  Using sterile technique, 2% lidocaine ultrasound guidance and a 12 gauge vacuum assisted needle biopsy was performed of the 9:30 region of the left breast using a lateral approach.  At the conclusion of the procedure, a ribbon shaped tissue marker clip was deployed into the biopsy cavity.  Follow-up 2-view mammogram was performed and dictated separately.  IMPRESSION: Ultrasound-guided biopsy of the left breast.  No apparent complications.   Original Report Authenticated By: Baird Lyons, M.D.     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 She is status post lumpectomy. She was sequentially seen in the high-risk clinic for discussion of future breast cancer risk reduction. She was discussed and has started tamoxifen 20 mg daily. Overall she's tolerating it well. She is also trying to exercise and eat healthy her to try to reduce her weight. We also today discussed stress  reduction.   PLAN:   #1 patient will continue tamoxifen on a daily basis.  #2 I will plan on seeing her back in 12 months time for followup.   All questions were answered. The patient knows to call the clinic with any problems, questions or concerns. We can certainly see the patient much sooner if necessary.  I spent 15 minutes counseling the patient face to face. The total time spent in the appointment was 20 minutes.    Drue Second, MD Medical/Oncology [facility] [phone] (beeper) [phone] (Office)  09/11/2013, 10:28 AM

## 2013-10-12 DIAGNOSIS — R42 Dizziness and giddiness: Secondary | ICD-10-CM

## 2013-10-12 HISTORY — DX: Dizziness and giddiness: R42

## 2013-10-19 ENCOUNTER — Encounter: Payer: Self-pay | Admitting: Oncology

## 2013-10-19 NOTE — Progress Notes (Signed)
Patient had left a message and wanted to use grant to pay her cone bill. I called her back to advise we can not do that. She will get her cell phone bill to me to pay for her. I advised her of bills we can pay for her with the grant.

## 2013-11-21 ENCOUNTER — Other Ambulatory Visit: Payer: Self-pay

## 2013-11-21 DIAGNOSIS — Z1231 Encounter for screening mammogram for malignant neoplasm of breast: Secondary | ICD-10-CM

## 2013-12-11 ENCOUNTER — Ambulatory Visit (INDEPENDENT_AMBULATORY_CARE_PROVIDER_SITE_OTHER): Payer: 59 | Admitting: Neurology

## 2013-12-11 ENCOUNTER — Encounter (INDEPENDENT_AMBULATORY_CARE_PROVIDER_SITE_OTHER): Payer: Self-pay

## 2013-12-11 ENCOUNTER — Encounter: Payer: Self-pay | Admitting: Neurology

## 2013-12-11 VITALS — BP 117/79 | HR 83 | Ht 63.0 in | Wt 245.0 lb

## 2013-12-11 DIAGNOSIS — M545 Low back pain, unspecified: Secondary | ICD-10-CM

## 2013-12-11 DIAGNOSIS — M79609 Pain in unspecified limb: Secondary | ICD-10-CM | POA: Insufficient documentation

## 2013-12-11 DIAGNOSIS — M25519 Pain in unspecified shoulder: Secondary | ICD-10-CM

## 2013-12-11 MED ORDER — DULOXETINE HCL 60 MG PO CPEP: 60.0000 mg | ORAL_CAPSULE | Freq: Every day | ORAL | Status: DC

## 2013-12-11 NOTE — Progress Notes (Signed)
PATIENT: [name] DOB: July 17, 1971  HISTORICAL  Lacrystal R Peace is a 43 yo RH AAF, is referred by her primary care PA Soyla Dryer for evaluation of muscle weakness, chronic pain.  She has  PMHX of DM since 2010, started insulin since 2013, HTN, obesity, hyperlipidemia, right breast cancer, status post lobectomy, is taking tamoxifen treatment.  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 Nelva Bush, MRI lumbar June 2014, which showed shallow central protrusion at L5-S1 is non compressive. Mild lower lumbar facet arthropathy.  She started workers compensation case, later was referred to [facility], her case was settled, she is in the process of applying for social disability, she started to process in early April 2015, before she got hurt, because for many years, she complains of generalized weakness, muscle achy pain, which has made worse since that incident,  Now, she is using canes, she got tired quickly, using electronic cart while shopping, she complains of low back pain, leg pain, she felt exhausted even after taking a shower, she has to sit down before she can dry off, she could not hold baby anymore, she is always in pain, if she lies too long, she has leg pain.  Sometimes she felt that she was sliced in half, she is in pain all the times, low back pain, "It is just crazy", hurt to touch.  She was given Elavil, 61m qhs recently, which has helped her sleep, complains of early morning drowsiness  she was seen by by her primary care in February second 2015, laboratory showed normal, ESR  CPK, TSH, CMP, previously mild anemia, with hemoglobin of 9 point 9.  REVIEW OF SYSTEMS: Full 14 system review of systems performed and notable only for fatigue, spinning sensation, constipations, anemia, feeling hot, feeling cold, joint pain, achy muscles, headaches, numbness, weakness, dizziness, insomnia, sleepiness, too much sleep, decreased energy, change in appetite   ALLERGIES: Allergies  Allergen Reactions  . Other Itching    Powered gloves    HOME MEDICATIONS: Current Outpatient Prescriptions on File Prior to Visit  Medication Sig Dispense Refill  . Cholecalciferol (VITAMIN D3 PO) Take 2 capsules by mouth 2 (two) times daily.      . cyclobenzaprine (FLEXERIL) 10 MG tablet Take 10 mg by mouth at bedtime.      . fish oil-omega-3 fatty acids 1000 MG capsule Take 2 g by mouth daily.      . insulin aspart (NOVOLOG) 100 UNIT/ML injection Inject 1-4 Units into the skin 3 (three) times daily before meals. Sliding scale CBG: 101-150 - 1 unit, 151-201 2 units, 202-250 3 untis, 251- 301 4 units      . insulin glargine (LANTUS) 100 UNIT/ML injection Inject 20 Units into the skin at bedtime.       . IRON, FERROUS GLUCONATE, PO Take 65 mg by mouth daily.      .Marland Kitchenlisinopril-hydrochlorothiazide (PRINZIDE,ZESTORETIC) 20-25 MG per tablet Take 1 tablet by mouth 2 (two) times daily.      . metFORMIN (GLUCOPHAGE) 1000 MG tablet Take 1,000 mg by mouth 2 times  daily at 12 noon and 4 pm.      . rosuvastatin (CRESTOR) 40 MG tablet Take 40 mg by mouth daily.      . tamoxifen (NOLVADEX) 20 MG tablet Take 1 tablet (20 mg total) by mouth daily.  30 tablet  12  . traMADol (ULTRAM) 50 MG tablet Take 50 mg by mouth every 6 (six) hours as needed.         PAST MEDICAL HISTORY: Past Medical History  Diagnosis Date  . Hyperlipidemia   . Hypertension     under control, has been on med. > 1 yr.  . Diabetes mellitus     IDDM  . 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 Arthritis     right knee  .  Wears dentures     upper  . Wears partial dentures     lower  . Lobular carcinoma in situ of breast 09/19/2012    PAST SURGICAL HISTORY: Past Surgical History  Procedure Laterality Date  . No past surgeries    . Breast lumpectomy with needle localization  08/15/2012    Procedure: BREAST LUMPECTOMY WITH NEEDLE LOCALIZATION;  Surgeon: Haywood Lasso, MD;  Location: [place];  Service: General;  Laterality: Right;  Wire localizations Right breast calcifications  . Breast surgery    . Refractive surgery Right     FAMILY HISTORY: Family History  Problem Relation Age of Onset  . Breast cancer Paternal Grandmother 48    breast; dbl mastectomy  . Diabetes Father   . Heart disease Father   . Hypertension Father   . Heart attack Father   . Diabetes Mother   . Heart disease Mother   . Hypertension Mother   . Diabetes Brother   . Diabetes Sister   . Fibromyalgia Sister   . Heart attack Maternal Grandmother     <35  . Heart attack Maternal Grandfather   . Heart attack Paternal Grandfather     SOCIAL HISTORY:  History   Social History  . Marital Status: Single    Spouse Name: N/A    Number of Children: N/A  . Years of Education: college   Occupational History  . care giver for senior care    Social History Main Topics  . Smoking status: Never Smoker   . Smokeless tobacco: Never Used  . Alcohol Use: No  . Drug Use: No  . Sexual Activity: Not Currently    Birth Control/ Protection: None   Other Topics Concern  . Not on file   Social History Narrative   Patient lives at home with her mother and she is single.   Patient works for [employer] .   Education college    Right handed   Caffeine none    PHYSICAL EXAM   Filed Vitals:   12/11/13 1017  BP: 117/79  Pulse: 83  Height: 5' 3"  (1.6 m)  Weight: 245 lb (111.131 kg)    Body mass index is 43.41 kg/(m^2).   Generalized: In no acute distress  Neck: Supple, no carotid  bruits   Cardiac: Regular rate rhythm  Pulmonary: Clear to auscultation bilaterally  Musculoskeletal: No deformity  Neurological examination  Mentation: Alert oriented to time, place, history taking, and causual conversation  Cranial nerve II-XII: Pupils were equal round reactive to light. Extraocular movements were full.  Visual field were full on confrontational test. Bilateral fundi were sharp.  Facial sensation and strength were normal. Hearing was  intact to finger rubbing bilaterally. Uvula tongue midline.  Head turning and shoulder shrug and were normal and symmetric.Tongue protrusion into cheek strength was normal.  Motor: Normal tone, bulk and strength.  Sensory: Decreased vibratory sensation at her toes, length dependent decreased fine touch, pinprick to her midshin level  Coordination: Normal finger to nose, heel-to-shin bilaterally there was no truncal ataxia  Gait: Rising up from seated position by pushing on the chair arm, atalgic gait, cautious limp. Romberg signs: Negative  Deep tendon reflexes: Brachioradialis 2/2, biceps 2/2, triceps 2/2, patellar 2/2, Achilles trace, plantar responses were flexor bilaterally.   DIAGNOSTIC DATA (LABS, IMAGING, TESTING) - I reviewed patient records, labs, notes, testing and imaging myself where available.  Lab Results  Component Value Date   WBC 11.3* 09/11/2013   HGB 9.9* 09/11/2013   HCT 30.5* 09/11/2013   MCV 87.4 09/11/2013   PLT 169 09/11/2013      Component Value Date/Time   NA 142 09/11/2013 0911   NA 138 08/11/2012 1130   K 4.1 09/11/2013 0911   K 4.4 08/11/2012 1130   CL 106 01/09/2013 0855   CL 101 08/11/2012 1130   CO2 24 09/11/2013 0911   CO2 27 08/11/2012 1130   GLUCOSE 207* 09/11/2013 0911   GLUCOSE 169* 01/09/2013 0855   GLUCOSE 146* 08/11/2012 1130   BUN 34.4* 09/11/2013 0911   BUN 16 08/11/2012 1130   CREATININE 1.6* 09/11/2013 0911   CREATININE 0.66 08/11/2012 1130   CALCIUM 9.7 09/11/2013 0911   CALCIUM 9.4  08/11/2012 1130   PROT 7.1 09/11/2013 0911   ALBUMIN 3.8 09/11/2013 0911   AST 18 09/11/2013 0911   ALT 13 09/11/2013 0911   ALKPHOS 45 09/11/2013 0911   BILITOT 0.40 09/11/2013 0911   GFRNONAA >90 08/11/2012 1130   GFRAA >90 08/11/2012 1130     ASSESSMENT AND PLAN  Bobbette CALIANNE LARUE is a 43 y.o. 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 EMG nerve conduction study 2 Cymbalta 60 mg.       Marcial Pacas, M.D. Ph.D.  [facility] [address] [place], [place] [address] [phone]

## 2013-12-14 ENCOUNTER — Telehealth: Payer: Self-pay | Admitting: Neurology

## 2013-12-14 MED ORDER — DULOXETINE HCL 60 MG PO CPEP: 60.0000 mg | ORAL_CAPSULE | Freq: Every day | ORAL | Status: AC

## 2013-12-14 NOTE — Telephone Encounter (Signed)
Rx has been resent to [facility].  I called the patient.  She is aware.

## 2013-12-14 NOTE — Telephone Encounter (Signed)
Pt called to check the status of Duloxetine prescription. Found that the prescription was sent to [facility].  Pt asked if the prescription could be resend to the [facility].  Please call to advise.  Thank you.

## 2013-12-21 ENCOUNTER — Encounter (INDEPENDENT_AMBULATORY_CARE_PROVIDER_SITE_OTHER): Payer: 59

## 2013-12-21 ENCOUNTER — Ambulatory Visit (INDEPENDENT_AMBULATORY_CARE_PROVIDER_SITE_OTHER): Payer: 59 | Admitting: Neurology

## 2013-12-21 DIAGNOSIS — R209 Unspecified disturbances of skin sensation: Secondary | ICD-10-CM

## 2013-12-21 DIAGNOSIS — M545 Low back pain, unspecified: Secondary | ICD-10-CM

## 2013-12-21 DIAGNOSIS — M79609 Pain in unspecified limb: Secondary | ICD-10-CM

## 2013-12-21 DIAGNOSIS — M25519 Pain in unspecified shoulder: Secondary | ICD-10-CM

## 2013-12-21 DIAGNOSIS — E1142 Type 2 diabetes mellitus with diabetic polyneuropathy: Secondary | ICD-10-CM | POA: Insufficient documentation

## 2013-12-21 DIAGNOSIS — Z0289 Encounter for other administrative examinations: Secondary | ICD-10-CM

## 2013-12-21 NOTE — Procedures (Signed)
   NCS (NERVE CONDUCTION STUDY) WITH EMG (ELECTROMYOGRAPHY) REPORT   STUDY DATE: March 12th 2015 PATIENT NAME: Alicia Coffey DOB: [birth year] MRN: [id number]    TECHNOLOGIST: Laretta Alstrom ELECTROMYOGRAPHER: Marcial Pacas M.D.  CLINICAL INFORMATION:   43 years old [place] American female, with a long-standing history of diabetes, now presenting with bilateral feet paresthesia, generalized weakness diffuse body aching pain,  FINDINGS: NERVE CONDUCTION STUDY: Bilateral peroneal sensory responses were absent. Bilateral peroneal EDB motor responses showed moderate to severely decreased C. map amplitude, bilateral tibial H. reflexes were absent. Bilateral tibial motor responses were absent  Right median, ulnar sensory and motor responses were normal  NEEDLE ELECTROMYOGRAPHY: Selected needle examination was performed at right lower extremity muscles, and right lumbosacral paraspinal muscles.  Needle examination of right tibialis anterior, tibialis posterior, vastus lateralis, biceps femoris long head was normal. Right abductor pollicis longus: Increased insertion activity, 2 plus spontaneous activity, enlarged complex motor unit potential, with decreased recruitment patterns   There was no spontaneous activity at right lumbosacral paraspinal muscles, right L4, L5, S1  IMPRESSION:   This is an abnormal study. 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   INTERPRETING PHYSICIAN:   Marcial Pacas M.D. Ph.D. [facility] [address] [place], [place] [address] [phone]

## 2013-12-22 ENCOUNTER — Ambulatory Visit
Admission: RE | Admit: 2013-12-22 | Discharge: 2013-12-22 | Disposition: A | Discharge status: Home / Self Care | Payer: 59 | Source: Ambulatory Visit

## 2013-12-22 DIAGNOSIS — Z1231 Encounter for screening mammogram for malignant neoplasm of breast: Secondary | ICD-10-CM

## 2014-02-13 ENCOUNTER — Ambulatory Visit (HOSPITAL_COMMUNITY)
Admission: RE | Admit: 2014-02-13 | Discharge: 2014-02-13 | Disposition: A | Discharge status: Home / Self Care | Payer: 59 | Source: Ambulatory Visit | Attending: Obstetrics and Gynecology | Admitting: Obstetrics and Gynecology

## 2014-02-13 ENCOUNTER — Encounter (HOSPITAL_COMMUNITY): Payer: Self-pay

## 2014-02-13 VITALS — BP 118/80 | Temp 98.4°F | Ht 63.0 in | Wt 245.2 lb

## 2014-02-13 DIAGNOSIS — Z01419 Encounter for gynecological examination (general) (routine) without abnormal findings: Secondary | ICD-10-CM

## 2014-02-13 HISTORY — DX: Fibromyalgia: M79.7

## 2014-02-13 HISTORY — DX: Other intervertebral disc degeneration, lumbar region: M51.36

## 2014-02-13 HISTORY — DX: Anemia, unspecified: D64.9

## 2014-02-13 NOTE — Progress Notes (Signed)
Complaints of long heavy periods and history of anemia.  Pap Smear:  Pap smear completed today. Per patient last Pap smear was March 2012 at the free Pap smear screening at [facility] and was normal. Per patient she has no history of an abnormal Pap smear. No Pap smear results in EPIC.     Pelvic/Bimanual   Ext Genitalia No lesions, no swelling and no discharge observed on external genitalia.         Vagina Vagina pink and normal texture. No lesions or discharge observed in vagina.          Cervix Cervix is present. Cervix pink and of normal texture. Cervix friable. No discharge observed.     Uterus Uterus is present and palpable. Uterus in normal position. Uterus enlarged and firm.   Adnexae Bilateral ovaries present and palpable. No tenderness on palpation.          Rectovaginal No rectal exam completed today since patient had no rectal complaints. No skin abnormalities observed on exam.

## 2014-02-13 NOTE — Patient Instructions (Signed)
Told patient know [facility] will cover Pap smears every 3 years unless has a history of abnormal Pap smears. Let patient know will follow up with her within the next couple weeks with results by phone. Will refer patient to [facility] for heavy long periods and questionable fibroids. Alicia Coffey verbalized understanding.  Shirley Muscat, RN 9:26 AM

## 2014-02-13 NOTE — Addendum Note (Signed)
Encounter addended by: Shirley Muscat, RN on: 02/13/2014 10:43 AM<BR>     Documentation filed: Visit Diagnoses, Charges VN

## 2014-02-15 ENCOUNTER — Telehealth (HOSPITAL_COMMUNITY): Payer: Self-pay | Admitting: *Deleted

## 2014-02-15 NOTE — Telephone Encounter (Signed)
Telephoned patient at home # and discussed normal pap smear results. Next pap smear due in 3 years. Patient stated the day she had her pap smear had bleeding but now was just spotting that was pink. Advised patient if bleeding was not heavy then this was normal. Patient will call if symptoms are worse. Patient voiced understanding.

## 2014-03-20 ENCOUNTER — Emergency Department (HOSPITAL_COMMUNITY)
Admission: EM | Admit: 2014-03-20 | Discharge: 2014-03-20 | Disposition: A | Discharge status: Home / Self Care | Payer: 59 | Attending: Emergency Medicine | Admitting: Emergency Medicine

## 2014-03-20 ENCOUNTER — Encounter (HOSPITAL_COMMUNITY): Payer: Self-pay | Admitting: Emergency Medicine

## 2014-03-20 DIAGNOSIS — Z862 Personal history of diseases of the blood and blood-forming organs and certain disorders involving the immune mechanism: Secondary | ICD-10-CM | POA: Insufficient documentation

## 2014-03-20 DIAGNOSIS — E119 Type 2 diabetes mellitus without complications: Secondary | ICD-10-CM | POA: Insufficient documentation

## 2014-03-20 DIAGNOSIS — Z8739 Personal history of other diseases of the musculoskeletal system and connective tissue: Secondary | ICD-10-CM | POA: Insufficient documentation

## 2014-03-20 DIAGNOSIS — R55 Syncope and collapse: Secondary | ICD-10-CM | POA: Insufficient documentation

## 2014-03-20 DIAGNOSIS — Z794 Long term (current) use of insulin: Secondary | ICD-10-CM | POA: Insufficient documentation

## 2014-03-20 DIAGNOSIS — IMO0001 Reserved for inherently not codable concepts without codable children: Secondary | ICD-10-CM | POA: Insufficient documentation

## 2014-03-20 DIAGNOSIS — Z853 Personal history of malignant neoplasm of breast: Secondary | ICD-10-CM | POA: Insufficient documentation

## 2014-03-20 DIAGNOSIS — Z3202 Encounter for pregnancy test, result negative: Secondary | ICD-10-CM | POA: Insufficient documentation

## 2014-03-20 DIAGNOSIS — Z880 Allergy status to penicillin: Secondary | ICD-10-CM | POA: Insufficient documentation

## 2014-03-20 DIAGNOSIS — E785 Hyperlipidemia, unspecified: Secondary | ICD-10-CM | POA: Insufficient documentation

## 2014-03-20 DIAGNOSIS — N920 Excessive and frequent menstruation with regular cycle: Secondary | ICD-10-CM

## 2014-03-20 DIAGNOSIS — Z79899 Other long term (current) drug therapy: Secondary | ICD-10-CM | POA: Insufficient documentation

## 2014-03-20 DIAGNOSIS — I10 Essential (primary) hypertension: Secondary | ICD-10-CM | POA: Insufficient documentation

## 2014-03-20 LAB — BASIC METABOLIC PANEL
BUN: 26 mg/dL — AB (ref 6–23)
CALCIUM: 9.1 mg/dL (ref 8.4–10.5)
CO2: 22 meq/L (ref 19–32)
CREATININE: 1.19 mg/dL — AB (ref 0.50–1.10)
Chloride: 100 mEq/L (ref 96–112)
GFR calc Af Amer: 63 mL/min — ABNORMAL LOW (ref >90)
GFR calc non Af Amer: 55 mL/min — ABNORMAL LOW (ref >90)
GLUCOSE: 277 mg/dL — AB (ref 70–99)
Potassium: 4.4 mEq/L (ref 3.7–5.3)
Sodium: 138 mEq/L (ref 137–147)

## 2014-03-20 LAB — CBC WITH DIFFERENTIAL/PLATELET
Basophils Absolute: 0 10*3/uL (ref 0.0–0.1)
Basophils Relative: 0 % (ref 0–1)
EOS PCT: 7 % — AB (ref 0–5)
Eosinophils Absolute: 0.6 10*3/uL (ref 0.0–0.7)
HCT: 34.2 % — ABNORMAL LOW (ref 36.0–46.0)
Hemoglobin: 11.6 g/dL — ABNORMAL LOW (ref 12.0–15.0)
LYMPHS ABS: 3.5 10*3/uL (ref 0.7–4.0)
Lymphocytes Relative: 37 % (ref 12–46)
MCH: 29.1 pg (ref 26.0–34.0)
MCHC: 33.9 g/dL (ref 30.0–36.0)
MCV: 85.9 fL (ref 78.0–100.0)
MONOS PCT: 6 % (ref 3–12)
Monocytes Absolute: 0.6 10*3/uL (ref 0.1–1.0)
Neutro Abs: 4.8 10*3/uL (ref 1.7–7.7)
Neutrophils Relative %: 50 % (ref 43–77)
Platelets: 173 10*3/uL (ref 150–400)
RBC: 3.98 MIL/uL (ref 3.87–5.11)
RDW: 13.1 % (ref 11.5–15.5)
WBC: 9.5 10*3/uL (ref 4.0–10.5)

## 2014-03-20 LAB — HIV ANTIBODY (ROUTINE TESTING W REFLEX): HIV 1&2 Ab, 4th Generation: NONREACTIVE

## 2014-03-20 LAB — LACTIC ACID, PLASMA: LACTIC ACID, VENOUS: 1.3 mmol/L (ref 0.5–2.2)

## 2014-03-20 LAB — RPR

## 2014-03-20 LAB — POC URINE PREG, ED: Preg Test, Ur: NEGATIVE

## 2014-03-20 MED ORDER — MEDROXYPROGESTERONE ACETATE 10 MG PO TABS
10.0000 mg | ORAL_TABLET | Freq: Every day | ORAL | Status: DC
  Administered 2014-03-20: 10 mg via ORAL
  Filled 2014-03-20 (×2): qty 1

## 2014-03-20 MED ORDER — MEDROXYPROGESTERONE ACETATE 10 MG PO TABS: 10.0000 mg | ORAL_TABLET | Freq: Every day | ORAL | Status: DC

## 2014-03-20 MED ORDER — SODIUM CHLORIDE 0.9 % IV SOLN
1000.0000 mL | Freq: Once | INTRAVENOUS | Status: AC
  Administered 2014-03-20: 1000 mL via INTRAVENOUS

## 2014-03-20 MED ORDER — SODIUM CHLORIDE 0.9 % IV SOLN: 1000.0000 mL | INTRAVENOUS | Status: DC

## 2014-03-20 MED ORDER — MEDROXYPROGESTERONE ACETATE 10 MG PO TABS
ORAL_TABLET | ORAL | Status: AC
Start: 2014-03-20 — End: 2014-03-20
  Filled 2014-03-20: qty 1

## 2014-03-20 NOTE — ED Provider Notes (Signed)
CSN: [id number]     Arrival date & time 03/20/14  0116 History   First MD Initiated Contact with Patient 03/20/14 0230     Chief Complaint  Patient presents with  . Vaginal Bleeding     (Consider location/radiation/quality/duration/timing/severity/associated sxs/prior Treatment) Patient is a 43 y.o. female presenting with vaginal bleeding. The history is provided by the patient.  Vaginal Bleeding She states that her menses started yesterday and are heavier than normal. This evening, she said through 4 pads aand 4 hours and mastoid superior underwear. The second time she missed a period of underwear, as she was changing her pad, she felt hot and sweaty. She denies any chest pain or nausea or vomiting. She did feel lightheaded. Symptoms lasted about 30 minutes before resolving. She denies any abdominal cramping during this period she states that her menses started on Timentin she states that she is sexually abstinent. She feels like she is back to normal now.  Past Medical History  Diagnosis Date  . Hyperlipidemia   . Hypertension     under control, has been on med. > 1 yr.  . Diabetes mellitus     IDDM  . 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 Arthritis     right knee  . Wears dentures     upper  . Wears partial dentures     lower  . 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 Disc degeneration, lumbar   . Anemia   . Fibromyalgia    Past Surgical History  Procedure Laterality Date  . No past surgeries    . Breast lumpectomy with needle localization  08/15/2012    Procedure: BREAST LUMPECTOMY WITH NEEDLE LOCALIZATION;  Surgeon: Haywood Lasso, MD;  Location: [facility];  Service: General;  Laterality: Right;  Wire localizations Right breast calcifications  . Breast surgery    . Refractive surgery Right    Family History  Problem Relation Age of Onset  . Breast cancer Paternal Grandmother 67    breast; dbl mastectomy  . Diabetes Father    . Heart disease Father   . Hypertension Father   . Heart attack Father   . Diabetes Mother   . Heart disease Mother   . Hypertension Mother   . Diabetes Brother   . Diabetes Sister   . Fibromyalgia Sister   . Heart attack Maternal Grandmother     <35  . Heart attack Maternal Grandfather   . Heart attack Paternal Grandfather    History  Substance Use Topics  . Smoking status: Never Smoker   . Smokeless tobacco: Never Used  . Alcohol Use: No   OB History   Grav Para Term Preterm Abortions TAB SAB Ect Mult Living   0              Review of Systems  Genitourinary: Positive for vaginal bleeding.  All other systems reviewed and are negative.     Allergies  Other and Penicillins  Home Medications   Prior to Admission medications   Medication Sig Start Date End Date Taking? Authorizing Provider  amitriptyline (ELAVIL) 75 MG tablet Take 75 mg by mouth at bedtime.   Yes Historical Provider, MD  Cholecalciferol (VITAMIN D3 PO) Take 2 capsules by mouth 2 (two) times daily.   Yes Historical Provider, MD  cyclobenzaprine (FLEXERIL) 10 MG tablet Take 10 mg by mouth at bedtime.   Yes Historical Provider, MD  DULoxetine (CYMBALTA) 60 MG capsule  Take 1 capsule (60 mg total) by mouth daily. 12/14/13  Yes Marcial Pacas, MD  fish oil-omega-3 fatty acids 1000 MG capsule Take 2 g by mouth daily.   Yes Historical Provider, MD  glipiZIDE (GLUCOTROL) 5 MG tablet Take by mouth daily before breakfast.   Yes Historical Provider, MD  insulin aspart (NOVOLOG) 100 UNIT/ML injection Inject 1-4 Units into the skin 3 (three) times daily before meals. Sliding scale CBG: 101-150 - 1 unit, 151-201 2 units, 202-250 3 untis, 251- 301 4 units   Yes Historical Provider, MD  insulin glargine (LANTUS) 100 UNIT/ML injection Inject 26 Units into the skin at bedtime.    Yes Historical Provider, MD  IRON, FERROUS GLUCONATE, PO Take 65 mg by mouth daily.   Yes Historical Provider, MD  lisinopril-hydrochlorothiazide  (PRINZIDE,ZESTORETIC) 20-25 MG per tablet Take 1 tablet by mouth 2 (two) times daily.   Yes Historical Provider, MD  Magnesium 250 MG TABS Take by mouth.   Yes Historical Provider, MD  metFORMIN (GLUCOPHAGE) 1000 MG tablet Take 1,000 mg by mouth 2 times daily at 12 noon and 4 pm.   Yes Historical Provider, MD  rosuvastatin (CRESTOR) 40 MG tablet Take 40 mg by mouth daily.   Yes Historical Provider, MD  tamoxifen (NOLVADEX) 20 MG tablet Take 1 tablet (20 mg total) by mouth daily. 06/21/13  Yes Deatra Robinson, MD  traMADol (ULTRAM) 50 MG tablet Take 50 mg by mouth every 6 (six) hours as needed.   Yes Historical Provider, MD   BP 101/72  Pulse 91  Temp(Src) 98.1 F (36.7 C) (Oral)  Resp 18  Ht 5\' 3"  (1.6 m)  Wt 243 lb (110.224 kg)  BMI 43.06 kg/m2  SpO2 100%  LMP 03/18/2014 Physical Exam  Nursing note and vitals reviewed.  43 year old female, resting comfortably and in no acute distress. Vital signs are normal. Oxygen saturation is 100%, which is normal. Head is normocephalic and atraumatic. PERRLA, EOMI. Oropharynx is clear. Neck is nontender and supple without adenopathy or JVD. Back is nontender and there is no CVA tenderness. Lungs are clear without rales, wheezes, or rhonchi. Chest is nontender. Heart has regular rate and rhythm without murmur. Abdomen is soft, flat, nontender without masses or hepatosplenomegaly and peristalsis is normoactive. Pelvic: Normal external female genitalia. Vaginal vault is filled with blood and I cannot adequately visualize the cervix. On bimanual exam, cervix appears normal but remainder of exam is difficult due to body habitus. There is no tenderness. Extremities have no cyanosis or edema, full range of motion is present. Skin is warm and dry without rash. Neurologic: Mental status is normal, cranial nerves are intact, there are no motor or sensory deficits.  ED Course  Procedures (including critical care time) Labs Review Results for orders placed  during the hospital encounter of 03/20/14  CBC WITH DIFFERENTIAL      Result Value Ref Range   WBC 9.5  4.0 - 10.5 K/uL   RBC 3.98  3.87 - 5.11 MIL/uL   Hemoglobin 11.6 (*) 12.0 - 15.0 g/dL   HCT 34.2 (*) 36.0 - 46.0 %   MCV 85.9  78.0 - 100.0 fL   MCH 29.1  26.0 - 34.0 pg   MCHC 33.9  30.0 - 36.0 g/dL   RDW 13.1  11.5 - 15.5 %   Platelets 173  150 - 400 K/uL   Neutrophils Relative % 50  43 - 77 %   Neutro Abs 4.8  1.7 - 7.7 K/uL  Lymphocytes Relative 37  12 - 46 %   Lymphs Abs 3.5  0.7 - 4.0 K/uL   Monocytes Relative 6  3 - 12 %   Monocytes Absolute 0.6  0.1 - 1.0 K/uL   Eosinophils Relative 7 (*) 0 - 5 %   Eosinophils Absolute 0.6  0.0 - 0.7 K/uL   Basophils Relative 0  0 - 1 %   Basophils Absolute 0.0  0.0 - 0.1 K/uL  BASIC METABOLIC PANEL      Result Value Ref Range   Sodium 138  137 - 147 mEq/L   Potassium 4.4  3.7 - 5.3 mEq/L   Chloride 100  96 - 112 mEq/L   CO2 22  19 - 32 mEq/L   Glucose, Bld 277 (*) 70 - 99 mg/dL   BUN 26 (*) 6 - 23 mg/dL   Creatinine, Ser 1.19 (*) 0.50 - 1.10 mg/dL   Calcium 9.1  8.4 - 10.5 mg/dL   GFR calc non Af Amer 55 (*) >90 mL/min   GFR calc Af Amer 63 (*) >90 mL/min  LACTIC ACID, PLASMA      Result Value Ref Range   Lactic Acid, Venous 1.3  0.5 - 2.2 mmol/L  POC URINE PREG, ED      Result Value Ref Range   Preg Test, Ur NEGATIVE  NEGATIVE      EKG Interpretation   Date/Time:  Tuesday March 20 2014 03:23:52 EDT Ventricular Rate:  88 PR Interval:  147 QRS Duration: 88 QT Interval:  366 QTC Calculation: 443 R Axis:   -16 Text Interpretation:  Sinus rhythm Borderline left axis deviation When  compared with ECG of 08/11/2012, No significant change was found Confirmed  by [facility]  MD, Taquan Bralley ([id number]) on 03/20/2014 3:27:45 AM      MDM   Final diagnoses:  Menorrhagia  Vasovagal episode    Menorrhagia. Episode of lightheadedness and sweating may have been a vasovagal episode. She does have history of anemia and renal insufficiency.  Renal insufficiency appears unchanged from baseline anemia has actually improved significantly. She is given a dose of digoxin progesterone and discharged with prescription for same for 5 days. She is referred to GYN for followup.    Delora Fuel, MD 69/45/03 [id number]

## 2014-03-20 NOTE — ED Notes (Signed)
Dr. Roxanne Mins unable to do pelvic due to heavy bleeding.

## 2014-03-20 NOTE — ED Notes (Signed)
Pt c/o vaginal bleeding today. Pt states she has used 4 pads in last 4 hours. Pt started her cycle Sunday.

## 2014-03-20 NOTE — Discharge Instructions (Signed)
Abnormal Uterine Bleeding Abnormal uterine bleeding can affect women at various stages in life, including teenagers, women in their reproductive years, pregnant women, and women who have reached menopause. Several kinds of uterine bleeding are considered abnormal, including:  Bleeding or spotting between periods.   Bleeding after sexual intercourse.   Bleeding that is heavier or more than normal.   Periods that last longer than usual.  Bleeding after menopause.  Many cases of abnormal uterine bleeding are minor and simple to treat, while others are more serious. Any type of abnormal bleeding should be evaluated by your health care provider. Treatment will depend on the cause of the bleeding. HOME CARE INSTRUCTIONS Monitor your condition for any changes. The following actions may help to alleviate any discomfort you are experiencing:  Avoid the use of tampons and douches as directed by your health care provider.  Change your pads frequently. You should get regular pelvic exams and Pap tests. Keep all follow-up appointments for diagnostic tests as directed by your health care provider.  SEEK MEDICAL CARE IF:   Your bleeding lasts more than 1 week.   You feel dizzy at times.  SEEK IMMEDIATE MEDICAL CARE IF:   You pass out.   You are changing pads every 15 to 30 minutes.   You have abdominal pain.  You have a fever.   You become sweaty or weak.   You are passing large blood clots from the vagina.   You start to feel nauseous and vomit. MAKE SURE YOU:   Understand these instructions.  Will watch your condition.  Will get help right away if you are not doing well or get worse. Document Released: 09/28/2005 Document Revised: 05/31/2013 Document Reviewed: 04/27/2013 [facility] Patient Information 2014 [place], [place].  Medroxyprogesterone tablets What is this medicine? MEDROXYPROGESTERONE (me DROX ee proe JES te rone) is a hormone in a class called progestins. It  is commonly used to prevent the uterine lining from overgrowth in women taking an estrogen after menopause. It is also used to treat irregular menstrual bleeding or a lack of menstrual bleeding in women. This medicine may be used for other purposes; ask your health care provider or pharmacist if you have questions. COMMON BRAND NAME(S): Amen, Provera What should I tell my health care provider before I take this medicine? They need to know if you have any of these conditions: -blood vessel disease or a history of a blood clot in the lungs or legs -breast, cervical or vaginal cancer -heart disease -kidney disease -liver disease -migraine -recent miscarriage or abortion -mental depression -migraine -seizures (convulsions) -stroke -vaginal bleeding that has not been evaluated -an unusual or allergic reaction to medroxyprogesterone, other medicines, foods, dyes, or preservatives -pregnant or trying to get pregnant -breast-feeding How should I use this medicine? Take this medicine by mouth with a glass of water. Follow the directions on the prescription label. Take your doses at regular intervals. Do not take your medicine more often than directed. Talk to your pediatrician regarding the use of this medicine in children. Special care may be needed. While this drug may be prescribed for children as young as 13 years for selected conditions, precautions do apply. Overdosage: If you think you have taken too much of this medicine contact a poison control center or emergency room at once. NOTE: This medicine is only for you. Do not share this medicine with others. What if I miss a dose? If you miss a dose, take it as soon as you can. If  it is almost time for your next dose, take only that dose. Do not take double or extra doses. What may interact with this medicine? -barbiturate medicines for inducing sleep or treating seizures (convulsions) -bosentan -carbamazepine -phenytoin -rifampin -St.  John's Wort This list may not describe all possible interactions. Give your health care provider a list of all the medicines, herbs, non-prescription drugs, or dietary supplements you use. Also tell them if you smoke, drink alcohol, or use illegal drugs. Some items may interact with your medicine. What should I watch for while using this medicine? Visit your health care professional for regular checks on your progress. You will need a regular breast and pelvic exam. If you have any reason to think you are pregnant, stop taking this medicine at once and contact your doctor or health care professional. What side effects may I notice from receiving this medicine? Side effects that you should report to your doctor or health care professional as soon as possible: -breast tenderness or discharge -changes in mood or emotions, such as depression -changes in vision or speech -pain in the abdomen, chest, groin, or leg -severe headache -skin rash, itching, or hives -sudden shortness of breath -unusually weak or tired -yellowing of skin or eyes Side effects that usually do not require medical attention (report to your doctor or health care professional if they continue or are bothersome): -acne -change in menstrual bleeding pattern or flow -changes in sexual desire -facial hair growth -fluid retention and swelling -headache -upset stomach -weight gain or loss This list may not describe all possible side effects. Call your doctor for medical advice about side effects. You may report side effects to FDA at 1-800-FDA-1088. Where should I keep my medicine? Keep out of the reach of children. Store at room temperature between 20 and 25 degrees C (68 and 77 degrees F). Throw away any unused medicine after the expiration date. NOTE: This sheet is a summary. It may not cover all possible information. If you have questions about this medicine, talk to your doctor, pharmacist, or health care provider.  2014,  Elsevier/Gold Standard. (2008-09-27 11:26:12)

## 2014-03-26 ENCOUNTER — Encounter: Payer: Self-pay | Admitting: Obstetrics and Gynecology

## 2014-03-26 ENCOUNTER — Ambulatory Visit (INDEPENDENT_AMBULATORY_CARE_PROVIDER_SITE_OTHER): Payer: 59 | Admitting: Obstetrics and Gynecology

## 2014-03-26 VITALS — BP 108/60 | Ht 63.0 in | Wt 244.0 lb

## 2014-03-26 DIAGNOSIS — N926 Irregular menstruation, unspecified: Secondary | ICD-10-CM

## 2014-03-26 DIAGNOSIS — N939 Abnormal uterine and vaginal bleeding, unspecified: Secondary | ICD-10-CM | POA: Insufficient documentation

## 2014-03-26 NOTE — Progress Notes (Signed)
This chart was scribed by Ludger Nutting, Medical Scribe, for Dr. Mallory Shirk on 03/26/14 at 12:01 PM. This chart was reviewed by Dr. Mallory Shirk for accuracy.   [facility] Visit  Patient name: Alicia Coffey MRN [id number]  Date of birth: 02-19-1971  CC & HPI:  Alicia Coffey is a 43 y.o. female presenting today for follow up after ED visit on 03/20/14. She reports heavier and prolonged vaginal bleeding with last menses. She reports having a period every other month but states it was worse with the last one. She reports having an episode of SOB, fatigue, and exhaustion while showering one day recently.   She was diagnosed with lobular carcinoma in situ of breast which required surgery in 2013 and she was subsequently started on Tamoxifen.  ROS:  +vaginal bleeding +SOB, fatigue, exhaustion while/after showering   Pertinent History Reviewed:   Reviewed: Significant for - Medical    Lobular carcinoma in situ of breast in 2013                               Surgical Hx:   Medications: Reviewed & Updated - see associated section Social History: Reviewed -  reports that she has never smoked. She has never used smokeless tobacco.  Objective Findings:  Vitals: Blood pressure 108/60, height 5\' 3"  (1.6 m), weight 244 lb (110.678 kg), last menstrual period 03/18/2014.  Physical Examination: General appearance - alert, well appearing, and in no distress Mental status - alert, oriented to person, place, and time Pelvic - VULVA: normal appearing vulva with no masses, tenderness or lesions, VAGINA: normal appearing vagina with normal color and discharge, no lesions, small amount of blood in secretions, CERVIX: normal appearing cervix without discharge or lesions, no growths noted, UTERUS: uterus is normal size, shape, consistency and nontender, ADNEXA: normal adnexa in size, nontender and no masses   Assessment & Plan:   A: 1. AUB while on Tamoxifen   P: 1. Pelvic ultrasound in  office 2. Endometrial biopsy  3. Follow up next week

## 2014-03-26 NOTE — Patient Instructions (Signed)
Endometrial Biopsy Endometrial biopsy is a procedure in which a tissue sample is taken from inside the uterus. The tissue sample is then looked at under a microscope to see if the tissue is normal or abnormal. The endometrium is the lining of the uterus. This procedure helps determine where you are in your menstrual cycle and how hormone levels are affecting the lining of the uterus. This procedure may also be used to evaluate uterine bleeding or to diagnose endometrial cancer, tuberculosis, polyps, or inflammatory conditions.  LET YOUR HEALTH CARE PROVIDER KNOW ABOUT:  Any allergies you have.  All medicines you are taking, including vitamins, herbs, eye drops, creams, and over-the-counter medicines.  Previous problems you or members of your family have had with the use of anesthetics.  Any blood disorders you have.  Previous surgeries you have had.  Medical conditions you have.  Possibility of pregnancy. RISKS AND COMPLICATIONS Generally, this is a safe procedure. However, as with any procedure, complications can occur. Possible complications include:  Bleeding.  Pelvic infection.  Puncture of the uterine wall with the biopsy device (rare). BEFORE THE PROCEDURE   Keep a record of your menstrual cycles as directed by your health care provider. You may need to schedule your procedure for a specific time in your cycle.  You may want to bring a sanitary pad to wear home after the procedure.  Arrange for someone to drive you home after the procedure if you will be given a medicine to help you relax (sedative). PROCEDURE   You may be given a sedative to relax you.  You will lie on an exam table with your feet and legs supported as in a pelvic exam.  Your health care provider will insert an instrument (speculum) into your vagina to see your cervix.  Your cervix will be cleansed with an antiseptic solution. A medicine (local anesthetic) will be used to numb the cervix.  A forceps  instrument (tenaculum) will be used to hold your cervix steady for the biopsy.  A thin, rodlike instrument (uterine sound) will be inserted through your cervix to determine the length of your uterus and the location where the biopsy sample will be removed.  A thin, flexible tube (catheter) will be inserted through your cervix and into the uterus. The catheter is used to collect the biopsy sample from your endometrial tissue.  The catheter and speculum will then be removed, and the tissue sample will be sent to a lab for examination. AFTER THE PROCEDURE  You will rest in a recovery area until you are ready to go home.  You may have mild cramping and a small amount of vaginal bleeding for a few days after the procedure. This is normal.  Make sure you find out how to get your test results. Document Released: 01/29/2005 Document Revised: 05/31/2013 Document Reviewed: 03/15/2013 [employer] Patient Information 2014 [employer].  

## 2014-04-03 ENCOUNTER — Other Ambulatory Visit: Payer: Self-pay | Admitting: Obstetrics and Gynecology

## 2014-04-03 DIAGNOSIS — N939 Abnormal uterine and vaginal bleeding, unspecified: Secondary | ICD-10-CM

## 2014-04-03 DIAGNOSIS — Z853 Personal history of malignant neoplasm of breast: Secondary | ICD-10-CM

## 2014-04-04 ENCOUNTER — Encounter: Payer: Self-pay | Admitting: Obstetrics and Gynecology

## 2014-04-04 ENCOUNTER — Ambulatory Visit (INDEPENDENT_AMBULATORY_CARE_PROVIDER_SITE_OTHER): Payer: 59 | Admitting: Obstetrics and Gynecology

## 2014-04-04 ENCOUNTER — Ambulatory Visit (INDEPENDENT_AMBULATORY_CARE_PROVIDER_SITE_OTHER): Payer: 59

## 2014-04-04 ENCOUNTER — Other Ambulatory Visit: Payer: Self-pay | Admitting: Obstetrics and Gynecology

## 2014-04-04 VITALS — BP 120/82 | Ht 63.0 in | Wt 244.0 lb

## 2014-04-04 DIAGNOSIS — N939 Abnormal uterine and vaginal bleeding, unspecified: Secondary | ICD-10-CM

## 2014-04-04 DIAGNOSIS — N926 Irregular menstruation, unspecified: Secondary | ICD-10-CM

## 2014-04-04 DIAGNOSIS — N840 Polyp of corpus uteri: Secondary | ICD-10-CM

## 2014-04-04 DIAGNOSIS — Z853 Personal history of malignant neoplasm of breast: Secondary | ICD-10-CM

## 2014-04-04 DIAGNOSIS — N8500 Endometrial hyperplasia, unspecified: Secondary | ICD-10-CM

## 2014-04-04 DIAGNOSIS — D259 Leiomyoma of uterus, unspecified: Secondary | ICD-10-CM

## 2014-04-04 NOTE — Progress Notes (Signed)
This chart was scribed by Ludger Nutting, Medical Scribe, for Dr. Mallory Shirk on 04/04/14 at 11:41 AM. This chart was reviewed by Dr. Mallory Shirk for accuracy.   Patient ID: Alicia Coffey, female   DOB: 1971-05-09, 43 y.o.   MRN: [id number]  HPI: Patient was seen on 6/15 for abnormal uterine bleeding. Pt here today to review [place] results and to have an ENDO biopsy as well. Pt is not sexually active and had the [place] so a UPT was not done. Consent signed for procedure.  Ultrasound Results: Anteverted uterus with multiple fibroids noted, Endom-20.41mm distorted by fibroids, bilateral adnexa/ovaries appear WNL no free fluid or adnexal masses noted within the pelvis  Endometrial Biopsy: Patient given informed consent, signed copy in the chart, time out was performed. Time out taken. The patient was placed in the lithotomy position and the cervix brought into view with sterile speculum.  Portio of cervix cleansed x 2 with betadine swabs.  A tenaculum was placed in the anterior lip of the cervix. The uterus was sounded for depth of 9 cm. Milex uterine Explora 3 mm was introduced to into the uterus, suction created,  and an endometrial sample was obtained. All equipment was removed and accounted for.   The patient tolerated the procedure well.    Patient given post procedure instructions.   Diagnosis: endometrial hyperplasia, on Tamoxifen.  Followup: 3 weeks for results

## 2014-04-04 NOTE — Patient Instructions (Signed)
Endometrial Biopsy, Care After Refer to this sheet in the next few weeks. These instructions provide you with information on caring for yourself after your procedure. Your health care Janeece Blok may also give you more specific instructions. Your treatment has been planned according to current medical practices, but problems sometimes occur. Call your health care Saachi Zale if you have any problems or questions after your procedure. WHAT TO EXPECT AFTER THE PROCEDURE After your procedure, it is typical to have the following:  You may have mild cramping and a small amount of vaginal bleeding for a few days after the procedure. This is normal. HOME CARE INSTRUCTIONS  Only take over-the-counter or prescription medicine as directed by your health care Trampas Stettner.  Do not douche, use tampons, or have sexual intercourse until your health care Jakia Kennebrew approves.  Follow your health care Najai Waszak's instructions regarding any activity restrictions, such as strenuous exercise or heavy lifting. SEEK MEDICAL CARE IF:  You have heavy bleeding or bleeding longer than 2 days after the procedure.  You have bad smelling drainage from your vagina.  You have a fever and chills.  Youhave severe lower stomach (abdominal) pain. SEEK IMMEDIATE MEDICAL CARE IF:  You have severe cramps in your stomach or back.  You pass large blood clots.  Your bleeding increases.  You become weak or lightheaded, or you pass out. Document Released: 07/19/2013 Document Reviewed: 07/19/2013 [employer] Patient Information 2015 [employer]. This information is not intended to replace advice given to you by your health care Maxamillian Tienda. Make sure you discuss any questions you have with your health care Rena Hunke.  

## 2014-04-05 ENCOUNTER — Other Ambulatory Visit: Payer: 59 | Admitting: Obstetrics and Gynecology

## 2014-04-06 ENCOUNTER — Telehealth: Payer: Self-pay | Admitting: Obstetrics and Gynecology

## 2014-04-06 DIAGNOSIS — N939 Abnormal uterine and vaginal bleeding, unspecified: Secondary | ICD-10-CM

## 2014-04-06 NOTE — Telephone Encounter (Signed)
Path report.:  Endometrium, biopsy, endometrial, uterus sounds to 9cm - BENIGN ENDOMETRIAL POLYP AND ADJACENT BENIGN WEAKLY PROLIFERATIVE ENDOMETRIUM, NO ATYPIA, HYPERPLASIA, OR MALIGNANCY.  Pt made aware. Will follow for now

## 2014-04-19 ENCOUNTER — Encounter: Payer: Self-pay | Admitting: Oncology

## 2014-04-19 NOTE — Progress Notes (Signed)
Patient will email me her cell ph bill to be paid.

## 2014-04-25 ENCOUNTER — Ambulatory Visit (INDEPENDENT_AMBULATORY_CARE_PROVIDER_SITE_OTHER): Payer: 59 | Admitting: Obstetrics and Gynecology

## 2014-04-25 ENCOUNTER — Encounter: Payer: Self-pay | Admitting: Obstetrics and Gynecology

## 2014-04-25 VITALS — BP 128/76 | Ht 63.0 in | Wt 246.0 lb

## 2014-04-25 DIAGNOSIS — D259 Leiomyoma of uterus, unspecified: Secondary | ICD-10-CM

## 2014-04-25 NOTE — Patient Instructions (Signed)
Uterine Fibroid A uterine fibroid is a growth (tumor) that occurs in your uterus. This type of tumor is not cancerous and does not spread out of the uterus. You can have one or many fibroids. Fibroids can vary in size, weight, and where they grow in the uterus. Some can become quite large. Most fibroids do not require medical treatment, but some can cause pain or heavy bleeding during and between periods. CAUSES  A fibroid is the result of a single uterine cell that keeps growing (unregulated), which is different than most cells in the human body. Most cells have a control mechanism that keeps them from reproducing without control.  SIGNS AND SYMPTOMS   Bleeding.  Pelvic pain and pressure.  Bladder problems due to the size of the fibroid.  Infertility and miscarriages depending on the size and location of the fibroid. DIAGNOSIS  Uterine fibroids are diagnosed through a physical exam. Your health care provider may feel the lumpy tumors during a pelvic exam. Ultrasonography may be done to get information regarding size, location, and number of tumors.  TREATMENT   Your health care provider may recommend watchful waiting. This involves getting the fibroid checked by your health care provider to see if it grows or shrinks.   Hormone treatment or an intrauterine device (IUD) may be prescribed.   Surgery may be needed to remove the fibroids (myomectomy) or the uterus (hysterectomy). This depends on your situation. When fibroids interfere with fertility and a woman wants to become pregnant, a health care provider may recommend having the fibroids removed.  [place] care depends on how you were treated. In general:   Keep all follow-up appointments with your health care provider.   Only take over-the-counter or prescription medicines as directed by your health care provider. If you were prescribed a hormone treatment, take the hormone medicines exactly as directed. Do not  take aspirin. It can cause bleeding.   Talk to your health care provider about taking iron pills.  If your periods are troublesome but not so heavy, lie down with your feet raised slightly above your heart. Place cold packs on your lower abdomen.   If your periods are heavy, write down the number of pads or tampons you use per month. Bring this information to your health care provider.   Include green vegetables in your diet.  SEEK IMMEDIATE MEDICAL CARE IF:  You have pelvic pain or cramps not controlled with medicines.   You have a sudden increase in pelvic pain.   You have an increase in bleeding between and during periods.   You have excessive periods and soak tampons or pads in a half hour or less.  You feel lightheaded or have fainting episodes. Document Released: 09/25/2000 Document Revised: 07/19/2013 Document Reviewed: 04/27/2013 [facility] Patient Information 2015 [place], [place]. This information is not intended to replace advice given to you by your health care provider. Make sure you discuss any questions you have with your health care provider.  Endometrial Ablation Endometrial ablation removes the lining of the uterus (endometrium). It is usually a same-day, outpatient treatment. Ablation helps avoid major surgery, such as surgery to remove the cervix and uterus (hysterectomy). After endometrial ablation, you will have little or no menstrual bleeding and may not be able to have children. However, if you are premenopausal, you will need to use a reliable method of birth control following the procedure because of the small chance that pregnancy can occur. There are different reasons to  have this procedure, which include:  Heavy periods.  Bleeding that is causing anemia.  Irregular bleeding.  Bleeding fibroids on the lining inside the uterus if they are smaller than 3 centimeters. This procedure should not be done if:  You want children in the future.  You have  severe cramps with your menstrual period.  You have precancerous or cancerous cells in your uterus.  You were recently pregnant.  You have gone through menopause.  You have had major surgery on the uterus, such as a cesarean delivery. LET [facility] CARE PROVIDER KNOW ABOUT:  Any allergies you have.  All medicines you are taking, including vitamins, herbs, eye drops, creams, and over-the-counter medicines.  Previous problems you or members of your family have had with the use of anesthetics.  Any blood disorders you have.  Previous surgeries you have had.  Medical conditions you have. RISKS AND COMPLICATIONS  Generally, this is a safe procedure. However, as with any procedure, complications can occur. Possible complications include:  Perforation of the uterus.  Bleeding.  Infection of the uterus, bladder, or vagina.  Injury to surrounding organs.  An air bubble to the lung (air embolus).  Pregnancy following the procedure.  Failure of the procedure to help the problem, requiring hysterectomy.  Decreased ability to diagnose cancer in the lining of the uterus. BEFORE THE PROCEDURE  The lining of the uterus must be tested to make sure there is no pre-cancerous or cancer cells present.  An ultrasound may be performed to look at the size of the uterus and to check for abnormalities.  Medicines may be given to thin the lining of the uterus. PROCEDURE  During the procedure, your health care provider will use a tool called a resectoscope to help see inside your uterus. There are different ways to remove the lining of your uterus.   Radiofrequency - This method uses a radiofrequency-alternating electric current to remove the lining of the uterus.  Cryotherapy - This method uses extreme cold to freeze the lining of the uterus.  Heated-Free Liquid - This method uses heated salt (saline) solution to remove the lining of the uterus.  Microwave - This method uses  high-energy microwaves to heat up the lining of the uterus to remove it.  Thermal balloon - This method involves inserting a catheter with a balloon tip into the uterus. The balloon tip is filled with heated fluid to remove the lining of the uterus. AFTER THE PROCEDURE  After your procedure, do not have sexual intercourse or insert anything into your vagina until permitted by your health care provider. After the procedure, you may experience:  Cramps.  Vaginal discharge.  Frequent urination. Document Released: 08/07/2004 Document Revised: 05/31/2013 Document Reviewed: 03/01/2013 [facility] Patient Information 2015 [place], [place]. This information is not intended to replace advice given to you by your health care provider. Make sure you discuss any questions you have with your health care provider.

## 2014-04-25 NOTE — Progress Notes (Signed)
This patient's chart was scribed for Dr. Jonnie Kind by Neta Ehlers, Medical Scribe. The chart was scribed on 04/25/14 at 9:44 AM.    [facility] Visit  Patient name: Alicia Coffey MRN [id number]  Date of birth: 1971-09-03  CC & HPI:  Alicia Coffey is a 43 y.o. female presenting today follow up visit and to go over biopsy results. Pt states that she has not had a period yet this month, but she usually has one every other month since starting the tamoxifen; she has taken tamoxifen since 2013. She reports when menstruation occurs, the bleeding is heavier than she desires. Pt wants to know what the next step is, and how many fibroids she has and how big they are.   The pt reports she had an endometrial biopsy performed with benign results. Per medical records, the biopsy revealed a benign endometrial polyp and adjacent benign weakly proliferative   She also reports an ER visit on 03/20/14 because of heavy menstrual bleeding and increased dizziness, generalized weakness, SOB, and diaphoretic while showering.   ROS:  +Diaphoresis +Generalized weakness +Fatigue + SOB + Menstrual problem: missed menses and heavier than desired menses  No other complaints  Pertinent History Reviewed:   Reviewed: Significant for  Medical: Lobular carcinoma                                     Surgical Hx:   Endometrial biopsy  Medications: Reviewed & Updated - see associated section Social History: Reviewed -  reports that she has never smoked. She has never used smokeless tobacco.  Objective Findings:  Vitals: Blood pressure 128/76, height 5\' 3"  (1.6 m), weight 246 lb (111.585 kg), last menstrual period 03/19/2014.  Consulted pt in office for over 25 minutes concerning treatment options for uterine fibroids.   Assessment & Plan:    A: 1. Uterine fibroids   P: 1. Reviewed pt's fibroids which weighs approx 180 grams; pt has 3-10 fibroids present  2. Discussed endometrial ablation vs. IUD  placement 3. Pt will consider best option for her and follow-up

## 2014-05-23 ENCOUNTER — Ambulatory Visit (INDEPENDENT_AMBULATORY_CARE_PROVIDER_SITE_OTHER): Payer: 59 | Admitting: Obstetrics and Gynecology

## 2014-05-23 ENCOUNTER — Encounter: Payer: Self-pay | Admitting: Obstetrics and Gynecology

## 2014-05-23 VITALS — BP 112/60 | Ht 63.0 in | Wt 242.0 lb

## 2014-05-23 DIAGNOSIS — N920 Excessive and frequent menstruation with regular cycle: Secondary | ICD-10-CM

## 2014-05-23 DIAGNOSIS — Z853 Personal history of malignant neoplasm of breast: Secondary | ICD-10-CM

## 2014-05-23 DIAGNOSIS — Z09 Encounter for follow-up examination after completed treatment for conditions other than malignant neoplasm: Secondary | ICD-10-CM

## 2014-05-23 NOTE — Progress Notes (Signed)
This chart was scribed by Steva Colder, Medical Scribe, for Dr. Mallory Shirk on 05/23/14 at 9:42 AM. This chart was reviewed by Dr. Mallory Shirk for accuracy.    [facility] Visit  Patient name: Alicia Coffey MRN [id number]  Date of birth: August 11, 1971  CC & HPI:  Alicia Coffey is a 43 y.o. female presenting today for a follow up from the last visit. She states that thinks she is going to wait on surgery. She states that on the last visit, the discussion of a heavy cycle. She states that previously she had a cycle the was heavy to where she went to the ED. Pt states that her last period lasted a few days longer than usual. She states that it was not as bad as her usual period. She states that she just felt weak during her period. She denies lower abdominal and pelvic pain.   Patient's last menstrual period was 05/14/2014.   ROS:  -Pelvic pain -Abdominal pain No other complaints.   Pertinent History Reviewed:   Reviewed: Significant for  Medical         Past Medical History  Diagnosis Date  . Hyperlipidemia   . Hypertension     under control, has been on med. > 1 yr.  . Diabetes mellitus     IDDM  . 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 Arthritis     right knee  . Wears dentures     upper  . Wears partial dentures     lower  . 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 Disc degeneration, lumbar   . Anemia   . Fibromyalgia                               Surgical Hx:    Past Surgical History  Procedure Laterality Date  . No past surgeries    . Breast lumpectomy with needle localization  08/15/2012    Procedure: BREAST LUMPECTOMY WITH NEEDLE LOCALIZATION;  Surgeon: Haywood Lasso, MD;  Location: [facility];  Service: General;  Laterality: Right;  Wire localizations Right breast calcifications  . Breast surgery    . Refractive surgery Right    Medications: Reviewed & Updated - see associated section                       Current outpatient prescriptions:amitriptyline (ELAVIL) 75 MG tablet, Take 75 mg by mouth at bedtime., Disp: , Rfl: ;  Cholecalciferol (VITAMIN D3 PO), Take 2 capsules by mouth 2 (two) times daily., Disp: , Rfl: ;  cyclobenzaprine (FLEXERIL) 10 MG tablet, Take 10 mg by mouth at bedtime., Disp: , Rfl: ;  DULoxetine (CYMBALTA) 60 MG capsule, Take 1 capsule (60 mg total) by mouth daily., Disp: 30 capsule, Rfl: 12 fish oil-omega-3 fatty acids 1000 MG capsule, Take 2 g by mouth daily., Disp: , Rfl: ;  glipiZIDE (GLUCOTROL) 5 MG tablet, Take by mouth daily before breakfast., Disp: , Rfl: ;  insulin aspart (NOVOLOG) 100 UNIT/ML injection, Inject 1-4 Units into the skin 3 (three) times daily before meals. Sliding scale CBG: 101-150 - 1 unit, 151-201 2 units, 202-250 3 untis, 251- 301 4 units, Disp: , Rfl:  insulin glargine (LANTUS) 100 UNIT/ML injection, Inject 26 Units into the skin at bedtime. , Disp: , Rfl: ;  IRON, FERROUS GLUCONATE, PO, Take 65 mg by mouth  daily., Disp: , Rfl: ;  lisinopril-hydrochlorothiazide (PRINZIDE,ZESTORETIC) 20-25 MG per tablet, Take 1 tablet by mouth 2 (two) times daily., Disp: , Rfl: ;  metFORMIN (GLUCOPHAGE) 1000 MG tablet, Take 1,000 mg by mouth 2 times daily at 12 noon and 4 pm., Disp: , Rfl:  rosuvastatin (CRESTOR) 40 MG tablet, Take 40 mg by mouth daily., Disp: , Rfl: ;  tamoxifen (NOLVADEX) 20 MG tablet, Take 1 tablet (20 mg total) by mouth daily., Disp: 30 tablet, Rfl: 12;  traMADol (ULTRAM) 50 MG tablet, Take 50 mg by mouth every 6 (six) hours as needed., Disp: , Rfl: ;  Magnesium 250 MG TABS, Take by mouth., Disp: , Rfl:    Social History: Reviewed -  reports that she has never smoked. She has never used smokeless tobacco.  Objective Findings:  Vitals: Blood pressure 112/60, height 5\' 3"  (1.6 m), weight 242 lb (109.77 kg), last menstrual period 05/14/2014.   Assessment & Plan:   A:  1. Menorrhagia.  2. Normal endometrium biopsy.  3. On tamoxifen, for breast CA.     P:  1. Lengthy discussion about endometrial ablation versus IUD.  2. Monitor periods until 6 month follow up.

## 2014-05-23 NOTE — Patient Instructions (Addendum)
Intrauterine Device Insertion Most often, an intrauterine device (IUD) is inserted into the uterus to prevent pregnancy. There are 2 types of IUDs available:  Copper IUD--This type of IUD creates an environment that is not favorable to sperm survival. The mechanism of action of the copper IUD is not known for certain. It can stay in place for 10 years.  Hormone IUD--This type of IUD contains the hormone progestin (synthetic progesterone). The progestin thickens the cervical mucus and prevents sperm from entering the uterus, and it also thins the uterine lining. There is no evidence that the hormone IUD prevents implantation. One hormone IUD can stay in place for up to 5 years, and a different hormone IUD can stay in place for up to 3 years. An IUD is the most cost-effective birth control if left in place for the full duration. It may be removed at any time. LET [facility] CARE PROVIDER KNOW ABOUT:  Any allergies you have.  All medicines you are taking, including vitamins, herbs, eye drops, creams, and over-the-counter medicines.  Previous problems you or members of your family have had with the use of anesthetics.  Any blood disorders you have.  Previous surgeries you have had.  Possibility of pregnancy.  Medical conditions you have. RISKS AND COMPLICATIONS  Generally, intrauterine device insertion is a safe procedure. However, as with any procedure, complications can occur. Possible complications include:  Accidental puncture (perforation) of the uterus.  Accidental placement of the IUD either in the muscle layer of the uterus (myometrium) or outside the uterus. If this happens, the IUD can be found essentially floating around the bowels and must be taken out surgically.  The IUD may fall out of the uterus (expulsion). This is more common in women who have recently had a child.   Pregnancy in the fallopian tube (ectopic).  Pelvic inflammatory disease (PID), which is  infection of the uterus and fallopian tubes. The risk of PID is slightly increased in the first 20 days after the IUD is placed, but the overall risk is still very low. BEFORE THE PROCEDURE  Schedule the IUD insertion for when you will have your menstrual period or right after, to make sure you are not pregnant. Placement of the IUD is better tolerated shortly after a menstrual cycle.  You may need to take tests or be examined to make sure you are not pregnant.  You may be required to take a pregnancy test.  You may be required to get checked for sexually transmitted infections (STIs) prior to placement. Placing an IUD in someone who has an infection can make the infection worse.  You may be given a pain reliever to take 1 or 2 hours before the procedure.  An exam will be performed to determine the size and position of your uterus.  Ask your health care provider about changing or stopping your regular medicines. PROCEDURE   A tool (speculum) is placed in the vagina. This allows your health care provider to see the lower part of the uterus (cervix).  The cervix is prepped with a medicine that lowers the risk of infection.  You may be given a medicine to numb each side of the cervix (intracervical or paracervical block). This is used to block and control any discomfort with insertion.  A tool (uterine sound) is inserted into the uterus to determine the length of the uterine cavity and the direction the uterus may be tilted.  A slim instrument (IUD inserter) is inserted through  the cervical canal and into your uterus.  The IUD is placed in the uterine cavity and the insertion device is removed.  The nylon string that is attached to the IUD and used for eventual IUD removal is trimmed. It is trimmed so that it lays high in the vagina, just outside the cervix. AFTER THE PROCEDURE  You may have bleeding after the procedure. This is normal. It varies from light spotting for a few days to  menstrual-like bleeding.  You may have mild cramping. Document Released: 05/27/2011 Document Revised: 07/19/2013 Document Reviewed: 03/19/2013 [facility] Patient Information 2015 [place], [place]. This information is not intended to replace advice given to you by your health care provider. Make sure you discuss any questions you have with your health care provider. Endometrial Ablation Endometrial ablation removes the lining of the uterus (endometrium). It is usually a same-day, outpatient treatment. Ablation helps avoid major surgery, such as surgery to remove the cervix and uterus (hysterectomy). After endometrial ablation, you will have little or no menstrual bleeding and may not be able to have children. However, if you are premenopausal, you will need to use a reliable method of birth control following the procedure because of the small chance that pregnancy can occur. There are different reasons to have this procedure, which include:  Heavy periods.  Bleeding that is causing anemia.  Irregular bleeding.  Bleeding fibroids on the lining inside the uterus if they are smaller than 3 centimeters. This procedure should not be done if:  You want children in the future.  You have severe cramps with your menstrual period.  You have precancerous or cancerous cells in your uterus.  You were recently pregnant.  You have gone through menopause.  You have had major surgery on the uterus, such as a cesarean delivery. LET [facility] CARE PROVIDER KNOW ABOUT:  Any allergies you have.  All medicines you are taking, including vitamins, herbs, eye drops, creams, and over-the-counter medicines.  Previous problems you or members of your family have had with the use of anesthetics.  Any blood disorders you have.  Previous surgeries you have had.  Medical conditions you have. RISKS AND COMPLICATIONS  Generally, this is a safe procedure. However, as with any procedure, complications can  occur. Possible complications include:  Perforation of the uterus.  Bleeding.  Infection of the uterus, bladder, or vagina.  Injury to surrounding organs.  An air bubble to the lung (air embolus).  Pregnancy following the procedure.  Failure of the procedure to help the problem, requiring hysterectomy.  Decreased ability to diagnose cancer in the lining of the uterus. BEFORE THE PROCEDURE  The lining of the uterus must be tested to make sure there is no pre-cancerous or cancer cells present.  An ultrasound may be performed to look at the size of the uterus and to check for abnormalities.  Medicines may be given to thin the lining of the uterus. PROCEDURE  During the procedure, your health care provider will use a tool called a resectoscope to help see inside your uterus. There are different ways to remove the lining of your uterus.   Radiofrequency - This method uses a radiofrequency-alternating electric current to remove the lining of the uterus.  Cryotherapy - This method uses extreme cold to freeze the lining of the uterus.  Heated-Free Liquid - This method uses heated salt (saline) solution to remove the lining of the uterus.  Microwave - This method uses high-energy microwaves to heat up the lining of the  uterus to remove it.  Thermal balloon - This method involves inserting a catheter with a balloon tip into the uterus. The balloon tip is filled with heated fluid to remove the lining of the uterus. AFTER THE PROCEDURE  After your procedure, do not have sexual intercourse or insert anything into your vagina until permitted by your health care provider. After the procedure, you may experience:  Cramps.  Vaginal discharge.  Frequent urination. Document Released: 08/07/2004 Document Revised: 05/31/2013 Document Reviewed: 03/01/2013 [facility] Patient Information 2015 [place], [place]. This information is not intended to replace advice given to you by your health care  provider. Make sure you discuss any questions you have with your health care provider.

## 2014-05-23 NOTE — Progress Notes (Signed)
Patient ID: Alicia Coffey, female   DOB: 1971/02/28, 43 y.o.   MRN: [id number] Pt here today for follow up from last visit. Pt states that thinks she is going to wait on surgery and that she knows she doesn't want an IUD. Pt states that her last period started on 05/14/14 and lasted a few days longer than usual but was not as bad as her usual period. Pt states that she just felt weak. Pt denies any lower abdominal or pelvic pain at this time.

## 2014-06-15 ENCOUNTER — Telehealth: Payer: Self-pay | Admitting: Neurology

## 2014-06-15 NOTE — Telephone Encounter (Signed)
Faxed progress notes and NCV and EMG studies to [employer] on 06/15/14.

## 2014-07-03 ENCOUNTER — Other Ambulatory Visit: Payer: Self-pay | Admitting: Oncology

## 2014-07-03 DIAGNOSIS — N62 Hypertrophy of breast: Secondary | ICD-10-CM

## 2014-07-03 DIAGNOSIS — D0590 Unspecified type of carcinoma in situ of unspecified breast: Secondary | ICD-10-CM

## 2014-07-07 ENCOUNTER — Telehealth: Payer: Self-pay | Admitting: Hematology and Oncology

## 2014-07-07 NOTE — Telephone Encounter (Signed)
Lft msg for pt confirming MD/sch change, mailed out new schedule..... KJ

## 2014-07-25 ENCOUNTER — Ambulatory Visit (INDEPENDENT_AMBULATORY_CARE_PROVIDER_SITE_OTHER): Payer: 59 | Admitting: Obstetrics and Gynecology

## 2014-07-25 ENCOUNTER — Encounter: Payer: Self-pay | Admitting: Obstetrics and Gynecology

## 2014-07-25 VITALS — BP 120/76 | Ht 63.0 in

## 2014-07-25 DIAGNOSIS — N921 Excessive and frequent menstruation with irregular cycle: Secondary | ICD-10-CM | POA: Insufficient documentation

## 2014-07-25 DIAGNOSIS — D500 Iron deficiency anemia secondary to blood loss (chronic): Secondary | ICD-10-CM

## 2014-07-25 MED ORDER — MEDROXYPROGESTERONE ACETATE 10 MG PO TABS: 10.0000 mg | ORAL_TABLET | Freq: Every day | ORAL | Status: DC

## 2014-07-25 NOTE — Progress Notes (Signed)
Preoperative History and Physical  Alicia Coffey is a 43 y.o. female here for surgical management of endometrial ablation. She states that she has decided to do the ablation. She states that her periods are very heavy to where she will have to change four times within 4 hours. She states that her periods leave her weak and tired. She states that she has anemia and her renal doctor is concerned for the blood that is lost. She states that she has proteinuria. She states that this is because of her DM.   Proposed surgery: schedule for 3-4 weeks from today.   Past Medical History  Diagnosis Date  . Hyperlipidemia   . Hypertension     under control, has been on med. > 1 yr.  . Diabetes mellitus     IDDM  . 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 Arthritis     right knee  . Wears dentures     upper  . Wears partial dentures     lower  . 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 Disc degeneration, lumbar   . Anemia   . Fibromyalgia   . Vertigo 2015   Past Surgical History  Procedure Laterality Date  . No past surgeries    . Breast lumpectomy with needle localization  08/15/2012    Procedure: BREAST LUMPECTOMY WITH NEEDLE LOCALIZATION;  Surgeon: Haywood Lasso, MD;  Location: [facility];  Service: General;  Laterality: Right;  Wire localizations Right breast calcifications  . Breast surgery    . Refractive surgery Right    OB History  Gravida Para Term Preterm AB SAB TAB Ectopic Multiple Living  0                Patient denies any cervical dysplasia or STIs.   Current outpatient prescriptions:amitriptyline (ELAVIL) 75 MG tablet, Take 75 mg by mouth at bedtime., Disp: , Rfl: ;  Cholecalciferol (VITAMIN D3 PO), Take 2 capsules by mouth 2 (two) times daily., Disp: , Rfl: ;  cyclobenzaprine (FLEXERIL) 10 MG tablet, Take 10 mg by mouth at bedtime., Disp: , Rfl: ;  DULoxetine (CYMBALTA) 60 MG capsule, Take 1 capsule (60 mg total) by mouth daily.,  Disp: 30 capsule, Rfl: 12 fish oil-omega-3 fatty acids 1000 MG capsule, Take 2 g by mouth daily., Disp: , Rfl: ;  glipiZIDE (GLUCOTROL) 5 MG tablet, Take by mouth daily before breakfast., Disp: , Rfl: ;  insulin aspart (NOVOLOG) 100 UNIT/ML injection, Inject 1-4 Units into the skin 3 (three) times daily before meals. Sliding scale CBG: 101-150 - 1 unit, 151-201 2 units, 202-250 3 untis, 251- 301 4 units, Disp: , Rfl:  insulin glargine (LANTUS) 100 UNIT/ML injection, Inject 26 Units into the skin at bedtime. , Disp: , Rfl: ;  IRON, FERROUS GLUCONATE, PO, Take 65 mg by mouth daily., Disp: , Rfl: ;  lisinopril-hydrochlorothiazide (PRINZIDE,ZESTORETIC) 20-25 MG per tablet, Take 1 tablet by mouth 2 (two) times daily., Disp: , Rfl: ;  Magnesium 250 MG TABS, Take by mouth., Disp: , Rfl: ;  MECLIZINE HCL PO, Take 1 tablet by mouth daily., Disp: , Rfl:  metFORMIN (GLUCOPHAGE) 1000 MG tablet, Take 1,000 mg by mouth 2 times daily at 12 noon and 4 pm., Disp: , Rfl: ;  rosuvastatin (CRESTOR) 40 MG tablet, Take 40 mg by mouth daily., Disp: , Rfl: ;  tamoxifen (NOLVADEX) 20 MG tablet, TAKE 1 TABLET BY MOUTH ONCE DAILY, Disp: 30 tablet, Rfl: 2;  traMADol (ULTRAM) 50 MG tablet, Take 50 mg by mouth every 6 (six) hours as needed., Disp: , Rfl:    Allergies  Allergen Reactions  . Other Itching    Powered gloves  . Penicillins   . History  Substance Use Topics  . Smoking status: Never Smoker   . Smokeless tobacco: Never Used  . Alcohol Use: No   Patient Active Problem List   Diagnosis Date Noted  . Follow up 05/23/2014  . Endometrial hyperplasia 04/04/2014  . Fibroid, uterine 04/04/2014  . Abnormal uterine bleeding (AUB) 03/26/2014  . Diabetic peripheral neuropathy 12/21/2013  . Low back pain 12/11/2013  . Pain in joint, shoulder region 12/11/2013  . Pain in limb 12/11/2013  . 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 Diabetes 07/28/2012  . Hypertension 07/28/2012  . Hypercholesteremia 07/28/2012   . 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 Facility age limit for growth percentiles is 20 years., There were no vitals filed for this visit., No LMP recorded.  Physical Examination: Pelvic - normal external genitalia, vulva, vagina, cervix, uterus and adnexa,  VULVA: normal appearing vulva with no masses, tenderness or lesions,  VAGINA: normal appearing vagina with normal color and discharge, no lesions,  CERVIX: normal appearing cervix without discharge or lesions,  UTERUS: uterus is normal size, shape, consistency and nontender,  ADNEXA: normal adnexa in size, nontender and no masses  Labs:  No results found for this or any previous visit (from the past 2160 hour(s)).  Imaging Studies:    Assessment:  Patient Active Problem List   Diagnosis Date Noted  . Follow up 05/23/2014  . Endometrial hyperplasia 04/04/2014  . Fibroid, uterine 04/04/2014  . Abnormal uterine bleeding (AUB) 03/26/2014  . Diabetic peripheral neuropathy 12/21/2013  . Low back pain 12/11/2013  . Pain in joint, shoulder region 12/11/2013  . Pain in limb 12/11/2013  . 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 Diabetes 07/28/2012  . Hypertension 07/28/2012  . Hypercholesteremia 07/28/2012  . 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 Menorrhagia 2. Anemia managed by Renal doctor.  Plan:  Patient will undergo surgical management with endometrial ablation. The risks of surgery were discussed in detail with the patient including but not limited to: bleeding which may require transfusion or reoperation; infection which may require antibiotics; injury to surrounding organs which may involve bowel, bladder, ureters ; need for additional procedures including laparoscopy or laparotomy; thromboembolic  phenomenon, surgical site problems and other postoperative/anesthesia complications. Likelihood of success in alleviating the patient's condition was discussed. Routine postoperative instructions will be reviewed with the patient and her family in detail after surgery. The patient concurred with the proposed plan, giving informed written consent for the surgery. Patient has been NPO since last night she will remain NPO for procedure. Anesthesia and OR aware. Preoperative prophylactic antibiotics and SCDs ordered on call to the OR. To OR when ready.  1. D/C Tamoxifen for 3 weeks.  2. Rx for Provera for 10 days.   This chart was scribed for Jonnie Kind, MD by Steva Colder, ED Scribe. The patient was seen in room 2 at 11:40 AM.

## 2014-07-25 NOTE — Progress Notes (Signed)
Patient ID: Alicia Coffey, female   DOB: 09-Nov-1970, 43 y.o.   MRN: [id number] Pt here today for pre op visit. Pt has decided to proceed with surgery as discussed.

## 2014-08-06 ENCOUNTER — Encounter (HOSPITAL_COMMUNITY): Payer: Self-pay | Admitting: Pharmacy Technician

## 2014-08-15 NOTE — Patient Instructions (Signed)
Alicia Coffey  08/15/2014   Your procedure is scheduled on:  08/21/2014  Report to [facility] at 9:00 AM.  Call this number if you have problems the morning of surgery: [phone]   Remember:   Do not eat food or drink liquids after midnight.   Take these medicines the morning of surgery with A SIP OF WATER: Cymbalta and Zestoretic   Do not wear jewelry, make-up or nail polish.  Do not wear lotions, powders, or perfumes. You may wear deodorant.  Do not shave 48 hours prior to surgery. Men may shave face and neck.  Do not bring valuables to the hospital.  [facility] is not responsible                  for any belongings or valuables.               Contacts, dentures or bridgework may not be worn into surgery.  Leave suitcase in the car. After surgery it may be brought to your room.  For patients admitted to the hospital, discharge time is determined by your                treatment team.               Patients discharged the day of surgery will not be allowed to drive  home.  Name and phone number of your driver: family  Special Instructions: Shower using CHG 2 nights before surgery and the night before surgery.  If you shower the day of surgery use CHG.  Use special wash - you have one bottle of CHG for all showers.  You should use approximately 1/3 of the bottle for each shower.   Please read over the following fact sheets that you were given: Anesthesia Post-op Instructions    Hysteroscopy Hysteroscopy is a procedure used for looking inside the womb (uterus). It may be done for various reasons, including:  To evaluate abnormal bleeding, fibroid (benign, noncancerous) tumors, polyps, scar tissue (adhesions), and possibly cancer of the uterus.  To look for lumps (tumors) and other uterine growths.  To look for causes of why a woman cannot get pregnant (infertility), causes of recurrent loss of pregnancy (miscarriages), or a lost intrauterine device (IUD).  To perform a  sterilization by blocking the fallopian tubes from inside the uterus. In this procedure, a thin, flexible tube with a tiny light and camera on the end of it (hysteroscope) is used to look inside the uterus. A hysteroscopy should be done right after a menstrual period to be sure you are not pregnant. LET [place] CARE PROVIDER KNOW ABOUT:   Any allergies you have.  All medicines you are taking, including vitamins, herbs, eye drops, creams, and over-the-counter medicines.  Previous problems you or members of your family have had with the use of anesthetics.  Any blood disorders you have.  Previous surgeries you have had.  Medical conditions you have. RISKS AND COMPLICATIONS  Generally, this is a safe procedure. However, as with any procedure, complications can occur. Possible complications include:  Putting a hole in the uterus.  Excessive bleeding.  Infection.  Damage to the cervix.  Injury to other organs.  Allergic reaction to medicines.  Too much fluid used in the uterus for the procedure. BEFORE THE PROCEDURE   Ask your health care provider about changing or stopping any regular medicines.  Do not take aspirin or blood thinners for 1 week before the procedure, or  as directed by your health care provider. These can cause bleeding.  If you smoke, do not smoke for 2 weeks before the procedure.  In some cases, a medicine is placed in the cervix the day before the procedure. This medicine makes the cervix have a larger opening (dilate). This makes it easier for the instrument to be inserted into the uterus during the procedure.  Do not eat or drink anything for at least 8 hours before the surgery.  Arrange for someone to take you home after the procedure. PROCEDURE   You may be given a medicine to relax you (sedative). You may also be given one of the following:  A medicine that numbs the area around the cervix (local anesthetic).  A medicine that makes you sleep  through the procedure (general anesthetic).  The hysteroscope is inserted through the vagina into the uterus. The camera on the hysteroscope sends a picture to a TV screen. This gives the surgeon a good view inside the uterus.  During the procedure, air or a liquid is put into the uterus, which allows the surgeon to see better.  Sometimes, tissue is gently scraped from inside the uterus. These tissue samples are sent to a lab for testing. AFTER THE PROCEDURE   If you had a general anesthetic, you may be groggy for a couple hours after the procedure.  If you had a local anesthetic, you will be able to go home as soon as you are stable and feel ready.  You may have some cramping. This normally lasts for a couple days.  You may have bleeding, which varies from light spotting for a few days to menstrual-like bleeding for 3-7 days. This is normal.  If your test results are not back during the visit, make an appointment with your health care provider to find out the results. Document Released: 01/04/2001 Document Revised: 07/19/2013 Document Reviewed: 04/27/2013 [facility] Patient Information 2015 [place], [place]. This information is not intended to replace advice given to you by your health care provider. Make sure you discuss any questions you have with your health care provider.

## 2014-08-16 ENCOUNTER — Encounter (HOSPITAL_COMMUNITY)
Admission: RE | Admit: 2014-08-16 | Discharge: 2014-08-16 | Disposition: A | Discharge status: Home / Self Care | Payer: 59 | Source: Ambulatory Visit | Attending: Obstetrics and Gynecology | Admitting: Obstetrics and Gynecology

## 2014-08-16 ENCOUNTER — Encounter (HOSPITAL_COMMUNITY): Payer: Self-pay

## 2014-08-16 DIAGNOSIS — Z01812 Encounter for preprocedural laboratory examination: Secondary | ICD-10-CM | POA: Insufficient documentation

## 2014-08-16 HISTORY — DX: Anxiety disorder, unspecified: F41.9

## 2014-08-16 HISTORY — DX: Other abnormal involuntary movements: R25.8

## 2014-08-16 LAB — BASIC METABOLIC PANEL
Anion gap: 11 (ref 5–15)
BUN: 19 mg/dL (ref 6–23)
CO2: 28 meq/L (ref 19–32)
CREATININE: 1.15 mg/dL — AB (ref 0.50–1.10)
Calcium: 9.2 mg/dL (ref 8.4–10.5)
Chloride: 103 mEq/L (ref 96–112)
GFR calc Af Amer: 66 mL/min — ABNORMAL LOW (ref >90)
GFR calc non Af Amer: 57 mL/min — ABNORMAL LOW (ref >90)
Glucose, Bld: 170 mg/dL — ABNORMAL HIGH (ref 70–99)
POTASSIUM: 4.6 meq/L (ref 3.7–5.3)
Sodium: 142 mEq/L (ref 137–147)

## 2014-08-16 LAB — URINALYSIS, ROUTINE W REFLEX MICROSCOPIC
BILIRUBIN URINE: NEGATIVE
Glucose, UA: NEGATIVE mg/dL
Ketones, ur: NEGATIVE mg/dL
Nitrite: NEGATIVE
PH: 8 (ref 5.0–8.0)
Protein, ur: 30 mg/dL — AB
Specific Gravity, Urine: 1.02 (ref 1.005–1.030)
Urobilinogen, UA: 0.2 mg/dL (ref 0.0–1.0)

## 2014-08-16 LAB — CBC WITH DIFFERENTIAL/PLATELET
Basophils Absolute: 0 10*3/uL (ref 0.0–0.1)
Basophils Relative: 0 % (ref 0–1)
Eosinophils Absolute: 0.4 10*3/uL (ref 0.0–0.7)
Eosinophils Relative: 5 % (ref 0–5)
HEMATOCRIT: 28.3 % — AB (ref 36.0–46.0)
Hemoglobin: 9.3 g/dL — ABNORMAL LOW (ref 12.0–15.0)
LYMPHS ABS: 3.3 10*3/uL (ref 0.7–4.0)
LYMPHS PCT: 36 % (ref 12–46)
MCH: 28.7 pg (ref 26.0–34.0)
MCHC: 32.9 g/dL (ref 30.0–36.0)
MCV: 87.3 fL (ref 78.0–100.0)
Monocytes Absolute: 0.7 10*3/uL (ref 0.1–1.0)
Monocytes Relative: 7 % (ref 3–12)
NEUTROS ABS: 4.8 10*3/uL (ref 1.7–7.7)
Neutrophils Relative %: 52 % (ref 43–77)
Platelets: 225 10*3/uL (ref 150–400)
RBC: 3.24 MIL/uL — AB (ref 3.87–5.11)
RDW: 13.2 % (ref 11.5–15.5)
WBC: 9.2 10*3/uL (ref 4.0–10.5)

## 2014-08-16 LAB — URINE MICROSCOPIC-ADD ON

## 2014-08-16 LAB — HCG, SERUM, QUALITATIVE: Preg, Serum: NEGATIVE

## 2014-08-17 ENCOUNTER — Ambulatory Visit (HOSPITAL_COMMUNITY): Admit: 2014-08-17 | Payer: 59

## 2014-08-17 DIAGNOSIS — D500 Iron deficiency anemia secondary to blood loss (chronic): Secondary | ICD-10-CM | POA: Insufficient documentation

## 2014-08-17 LAB — TYPE AND SCREEN
ABO/RH(D): O NEG
Antibody Screen: NEGATIVE

## 2014-08-21 ENCOUNTER — Encounter (HOSPITAL_COMMUNITY)
Admission: RE | Disposition: A | Discharge status: Home / Self Care | Payer: Self-pay | Source: Ambulatory Visit | Attending: Obstetrics and Gynecology

## 2014-08-21 ENCOUNTER — Ambulatory Visit (HOSPITAL_COMMUNITY): Payer: 59 | Admitting: Certified Registered Nurse Anesthetist

## 2014-08-21 ENCOUNTER — Encounter (HOSPITAL_COMMUNITY): Payer: Self-pay | Admitting: Certified Registered Nurse Anesthetist

## 2014-08-21 ENCOUNTER — Ambulatory Visit (HOSPITAL_COMMUNITY)
Admission: RE | Admit: 2014-08-21 | Discharge: 2014-08-21 | Disposition: A | Discharge status: Home / Self Care | Payer: 59 | Source: Ambulatory Visit | Attending: Obstetrics and Gynecology | Admitting: Obstetrics and Gynecology

## 2014-08-21 DIAGNOSIS — N72 Inflammatory disease of cervix uteri: Secondary | ICD-10-CM | POA: Diagnosis not present

## 2014-08-21 DIAGNOSIS — M545 Low back pain: Secondary | ICD-10-CM | POA: Insufficient documentation

## 2014-08-21 DIAGNOSIS — E785 Hyperlipidemia, unspecified: Secondary | ICD-10-CM | POA: Insufficient documentation

## 2014-08-21 DIAGNOSIS — N840 Polyp of corpus uteri: Secondary | ICD-10-CM

## 2014-08-21 DIAGNOSIS — Z853 Personal history of malignant neoplasm of breast: Secondary | ICD-10-CM | POA: Diagnosis not present

## 2014-08-21 DIAGNOSIS — D500 Iron deficiency anemia secondary to blood loss (chronic): Secondary | ICD-10-CM

## 2014-08-21 DIAGNOSIS — D649 Anemia, unspecified: Secondary | ICD-10-CM | POA: Diagnosis not present

## 2014-08-21 DIAGNOSIS — N920 Excessive and frequent menstruation with regular cycle: Secondary | ICD-10-CM | POA: Diagnosis present

## 2014-08-21 DIAGNOSIS — N719 Inflammatory disease of uterus, unspecified: Secondary | ICD-10-CM

## 2014-08-21 DIAGNOSIS — I10 Essential (primary) hypertension: Secondary | ICD-10-CM | POA: Insufficient documentation

## 2014-08-21 DIAGNOSIS — E780 Pure hypercholesterolemia: Secondary | ICD-10-CM | POA: Insufficient documentation

## 2014-08-21 DIAGNOSIS — E1142 Type 2 diabetes mellitus with diabetic polyneuropathy: Secondary | ICD-10-CM | POA: Insufficient documentation

## 2014-08-21 DIAGNOSIS — M13861 Other specified arthritis, right knee: Secondary | ICD-10-CM | POA: Insufficient documentation

## 2014-08-21 DIAGNOSIS — M797 Fibromyalgia: Secondary | ICD-10-CM | POA: Insufficient documentation

## 2014-08-21 HISTORY — PX: POLYPECTOMY: SHX5525

## 2014-08-21 HISTORY — PX: DILITATION & CURRETTAGE/HYSTROSCOPY WITH THERMACHOICE ABLATION: SHX5569

## 2014-08-21 LAB — GLUCOSE, CAPILLARY
Glucose-Capillary: 139 mg/dL — ABNORMAL HIGH (ref 70–99)
Glucose-Capillary: 156 mg/dL — ABNORMAL HIGH (ref 70–99)

## 2014-08-21 SURGERY — DILATATION & CURETTAGE/HYSTEROSCOPY WITH THERMACHOICE ABLATION
Anesthesia: General | Site: Uterus

## 2014-08-21 MED ORDER — ONDANSETRON HCL 4 MG/2ML IJ SOLN: 4.0000 mg | Freq: Once | INTRAMUSCULAR | Status: DC | PRN

## 2014-08-21 MED ORDER — ROCURONIUM BROMIDE 100 MG/10ML IV SOLN
INTRAVENOUS | Status: DC | PRN
  Administered 2014-08-21: 5 mg via INTRAVENOUS
  Administered 2014-08-21: 25 mg via INTRAVENOUS

## 2014-08-21 MED ORDER — BUPIVACAINE-EPINEPHRINE 0.5% -1:200000 IJ SOLN
INTRAMUSCULAR | Status: DC | PRN
  Administered 2014-08-21: 19 mL

## 2014-08-21 MED ORDER — FENTANYL CITRATE 0.05 MG/ML IJ SOLN
INTRAMUSCULAR | Status: AC
  Filled 2014-08-21: qty 5

## 2014-08-21 MED ORDER — LACTATED RINGERS IV SOLN
INTRAVENOUS | Status: DC
  Administered 2014-08-21 (×2): via INTRAVENOUS

## 2014-08-21 MED ORDER — GLYCOPYRROLATE 0.2 MG/ML IJ SOLN
INTRAMUSCULAR | Status: AC
  Filled 2014-08-21: qty 2

## 2014-08-21 MED ORDER — PHENYLEPHRINE HCL 10 MG/ML IJ SOLN
INTRAMUSCULAR | Status: AC
  Filled 2014-08-21: qty 1

## 2014-08-21 MED ORDER — ONDANSETRON HCL 4 MG/2ML IJ SOLN
INTRAMUSCULAR | Status: DC | PRN
Start: 2014-08-21 — End: 2014-08-21
  Administered 2014-08-21: 4 mg via INTRAVENOUS

## 2014-08-21 MED ORDER — FENTANYL CITRATE 0.05 MG/ML IJ SOLN
INTRAMUSCULAR | Status: DC | PRN
  Administered 2014-08-21: 100 ug via INTRAVENOUS
  Administered 2014-08-21: 50 ug via INTRAVENOUS

## 2014-08-21 MED ORDER — PROPOFOL 10 MG/ML IV EMUL
INTRAVENOUS | Status: AC
  Filled 2014-08-21: qty 20

## 2014-08-21 MED ORDER — ONDANSETRON HCL 4 MG/2ML IJ SOLN
INTRAMUSCULAR | Status: AC
Start: 2014-08-21 — End: 2014-08-21
  Filled 2014-08-21: qty 2

## 2014-08-21 MED ORDER — DEXTROSE 5 % IV SOLN
INTRAVENOUS | Status: DC | PRN
  Administered 2014-08-21: 500 mL via INTRAVENOUS

## 2014-08-21 MED ORDER — NEOSTIGMINE METHYLSULFATE 10 MG/10ML IV SOLN
INTRAVENOUS | Status: DC | PRN
  Administered 2014-08-21: 3 mg via INTRAVENOUS

## 2014-08-21 MED ORDER — OXYCODONE-ACETAMINOPHEN 5-325 MG PO TABS: 1.0000 | ORAL_TABLET | ORAL | Status: DC | PRN

## 2014-08-21 MED ORDER — ONDANSETRON HCL 4 MG/2ML IJ SOLN
4.0000 mg | Freq: Once | INTRAMUSCULAR | Status: AC
  Administered 2014-08-21: 4 mg via INTRAVENOUS

## 2014-08-21 MED ORDER — KETOROLAC TROMETHAMINE 10 MG PO TABS: 10.0000 mg | ORAL_TABLET | Freq: Four times a day (QID) | ORAL | Status: DC | PRN

## 2014-08-21 MED ORDER — MIDAZOLAM HCL 2 MG/2ML IJ SOLN
1.0000 mg | INTRAMUSCULAR | Status: DC | PRN
  Administered 2014-08-21: 2 mg via INTRAVENOUS

## 2014-08-21 MED ORDER — 0.9 % SODIUM CHLORIDE (POUR BTL) OPTIME
TOPICAL | Status: DC | PRN
  Administered 2014-08-21: 1000 mL

## 2014-08-21 MED ORDER — DIPHENHYDRAMINE HCL 50 MG/ML IJ SOLN
INTRAMUSCULAR | Status: AC
  Filled 2014-08-21: qty 1

## 2014-08-21 MED ORDER — FENTANYL CITRATE 0.05 MG/ML IJ SOLN
25.0000 ug | INTRAMUSCULAR | Status: AC
  Administered 2014-08-21 (×2): 25 ug via INTRAVENOUS

## 2014-08-21 MED ORDER — NEOSTIGMINE METHYLSULFATE 10 MG/10ML IV SOLN
INTRAVENOUS | Status: AC
  Filled 2014-08-21: qty 1

## 2014-08-21 MED ORDER — EPHEDRINE SULFATE 50 MG/ML IJ SOLN
INTRAMUSCULAR | Status: AC
  Filled 2014-08-21: qty 1

## 2014-08-21 MED ORDER — BUPIVACAINE-EPINEPHRINE (PF) 0.5% -1:200000 IJ SOLN
INTRAMUSCULAR | Status: AC
  Filled 2014-08-21: qty 30

## 2014-08-21 MED ORDER — GLYCOPYRROLATE 0.2 MG/ML IJ SOLN
INTRAMUSCULAR | Status: DC | PRN
  Administered 2014-08-21: 0.4 mg via INTRAVENOUS

## 2014-08-21 MED ORDER — MIDAZOLAM HCL 2 MG/2ML IJ SOLN
INTRAMUSCULAR | Status: AC
  Filled 2014-08-21: qty 2

## 2014-08-21 MED ORDER — SODIUM CHLORIDE 0.9 % IR SOLN
Status: DC | PRN
  Administered 2014-08-21: 1000 mL

## 2014-08-21 MED ORDER — FENTANYL CITRATE 0.05 MG/ML IJ SOLN: 25.0000 ug | INTRAMUSCULAR | Status: DC | PRN

## 2014-08-21 MED ORDER — FENTANYL CITRATE 0.05 MG/ML IJ SOLN
INTRAMUSCULAR | Status: AC
  Filled 2014-08-21: qty 2

## 2014-08-21 MED ORDER — ONDANSETRON HCL 4 MG/2ML IJ SOLN
INTRAMUSCULAR | Status: AC
  Filled 2014-08-21: qty 2

## 2014-08-21 MED ORDER — ROCURONIUM BROMIDE 50 MG/5ML IV SOLN
INTRAVENOUS | Status: AC
  Filled 2014-08-21: qty 1

## 2014-08-21 MED ORDER — GLYCOPYRROLATE 0.2 MG/ML IJ SOLN
INTRAMUSCULAR | Status: AC
  Filled 2014-08-21: qty 4

## 2014-08-21 MED ORDER — SEVOFLURANE IN SOLN
RESPIRATORY_TRACT | Status: AC
  Filled 2014-08-21: qty 250

## 2014-08-21 MED ORDER — PROPOFOL 10 MG/ML IV BOLUS
INTRAVENOUS | Status: DC | PRN
  Administered 2014-08-21: 150 mg via INTRAVENOUS

## 2014-08-21 SURGICAL SUPPLY — 28 items
BAG DECANTER FOR FLEXI CONT (MISCELLANEOUS) ×2 IMPLANT
BAG HAMPER (MISCELLANEOUS) ×2 IMPLANT
CATH THERMACHOICE III (CATHETERS) ×2 IMPLANT
CLOTH BEACON ORANGE TIMEOUT ST (SAFETY) ×2 IMPLANT
COVER LIGHT HANDLE STERIS (MISCELLANEOUS) ×4 IMPLANT
FORMALIN 10 PREFIL 120ML (MISCELLANEOUS) ×2 IMPLANT
GLOVE BIOGEL PI IND STRL 7.5 (GLOVE) ×2 IMPLANT
GLOVE BIOGEL PI IND STRL 9 (GLOVE) ×1 IMPLANT
GLOVE BIOGEL PI INDICATOR 7.5 (GLOVE) ×2
GLOVE BIOGEL PI INDICATOR 9 (GLOVE) ×1
GLOVE ECLIPSE 9.0 STRL (GLOVE) ×2 IMPLANT
GLOVE SURG SS PI 7.5 STRL IVOR (GLOVE) ×2 IMPLANT
GOWN SPEC L3 XXLG W/TWL (GOWN DISPOSABLE) ×4 IMPLANT
GOWN STRL REUS W/TWL LRG LVL3 (GOWN DISPOSABLE) ×4 IMPLANT
INST SET HYSTEROSCOPY (KITS) ×2 IMPLANT
IV D5W 500ML (IV SOLUTION) ×2 IMPLANT
IV NS 1000ML (IV SOLUTION) ×1
IV NS 1000ML BAXH (IV SOLUTION) ×1 IMPLANT
KIT ROOM TURNOVER AP CYSTO (KITS) ×2 IMPLANT
MANIFOLD NEPTUNE II (INSTRUMENTS) ×2 IMPLANT
NS IRRIG 1000ML POUR BTL (IV SOLUTION) ×2 IMPLANT
PACK PERI GYN (CUSTOM PROCEDURE TRAY) ×2 IMPLANT
PAD ARMBOARD 7.5X6 YLW CONV (MISCELLANEOUS) ×2 IMPLANT
PAD TELFA 3X4 1S STER (GAUZE/BANDAGES/DRESSINGS) ×2 IMPLANT
SET BASIN LINEN APH (SET/KITS/TRAYS/PACK) ×2 IMPLANT
SET IRRIG Y TYPE TUR BLADDER L (SET/KITS/TRAYS/PACK) ×2 IMPLANT
SYR CONTROL 10ML LL (SYRINGE) ×2 IMPLANT
SYRINGE CONTROL L 12CC (SYRINGE) ×2 IMPLANT

## 2014-08-21 NOTE — Anesthesia Preprocedure Evaluation (Signed)
Anesthesia Evaluation  Patient identified by MRN, date of birth, ID band Patient awake    Reviewed: Allergy & Precautions, H&P , NPO status , Patient's Chart, lab work & pertinent test results  Airway Mallampati: I  TM Distance: >3 FB Neck ROM: Full    Dental  (+) Upper Dentures, Lower Dentures, Dental Advisory Given   Pulmonary neg pulmonary ROS,  breath sounds clear to auscultation        Cardiovascular hypertension, Pt. on medications Rhythm:Regular Rate:Normal     Neuro/Psych PSYCHIATRIC DISORDERS Anxiety    GI/Hepatic negative GI ROS,   Endo/Other  diabetes, Well Controlled, Type 2, Insulin Dependent, Oral Hypoglycemic AgentsMorbid obesity  Renal/GU      Musculoskeletal  (+) Arthritis -, Fibromyalgia -  Abdominal   Peds  Hematology  (+) anemia ,   Anesthesia Other Findings   Reproductive/Obstetrics                             Anesthesia Physical Anesthesia Plan  ASA: III  Anesthesia Plan: General   Post-op Pain Management:    Induction: Intravenous  Airway Management Planned: Oral ETT  Additional Equipment:   Intra-op Plan:   Post-operative Plan: Extubation in OR  Informed Consent: I have reviewed the patients History and Physical, chart, labs and discussed the procedure including the risks, benefits and alternatives for the proposed anesthesia with the patient or authorized representative who has indicated his/her understanding and acceptance.   Dental advisory given  Plan Discussed with: Anesthesiologist, Surgeon and CRNA  Anesthesia Plan Comments:         Anesthesia Quick Evaluation

## 2014-08-21 NOTE — OR Nursing (Signed)
Mother took glasses when patient went to OR

## 2014-08-21 NOTE — Brief Op Note (Signed)
08/21/2014  11:10 AM  PATIENT:  Alicia Coffey  43 y.o. female  PRE-OPERATIVE DIAGNOSIS:  menorrhagia- N92.0, anemia- D64.9  POST-OPERATIVE DIAGNOSIS:  menorrhagia- N92.0, anemia- D64.9, endometrial polyp  PROCEDURE:  Procedure(s): DILATATION & CURETTAGE/HYSTEROSCOPY WITH THERMACHOICE ABLATION (N/A) ENDOMETRIAL POLYPECTOMY (N/A)  SURGEON:  Surgeon(s) and Role:    * Jonnie Kind, MD - Primary  PHYSICIAN ASSISTANT:   ASSISTANTS: Blackwell CST   ANESTHESIA:   local and paracervical block  EBL:  Total I/O In: 1000 [I.V.:1000] Out: -   BLOOD ADMINISTERED:none  DRAINS: none   LOCAL MEDICATIONS USED:  MARCAINE    and Amount: 20 ml  SPECIMEN:  Source of Specimen:  endometrial curettings and endometrial polyp  DISPOSITION OF SPECIMEN:  PATHOLOGY  COUNTS:  YES  TOURNIQUET:  * No tourniquets in log *  DICTATION: .Dragon Dictation  PLAN OF CARE: Discharge to home after PACU  PATIENT DISPOSITION:  PACU - hemodynamically stable.   Delay start of Pharmacological VTE agent (>24hrs) due to surgical blood loss or risk of bleeding: not applicable

## 2014-08-21 NOTE — Anesthesia Postprocedure Evaluation (Signed)
  Anesthesia Post-op Note  Patient: Alicia Coffey  Procedure(s) Performed: Procedure(s): DILATATION & CURETTAGE/HYSTEROSCOPY WITH THERMACHOICE ABLATION (N/A) ENDOMETRIAL POLYPECTOMY (N/A)  Patient Location: PACU  Anesthesia Type:General  Level of Consciousness: awake, oriented, patient cooperative and responds to stimulation  Airway and Oxygen Therapy: Patient Spontanous Breathing and Patient connected to face mask oxygen  Post-op Pain: none  Post-op Assessment: Post-op Vital signs reviewed, Patient's Cardiovascular Status Stable, Respiratory Function Stable, Patent Airway, No signs of Nausea or vomiting and Pain level controlled  Post-op Vital Signs: Reviewed and stable  Last Vitals:  Filed Vitals:   08/21/14 0925  BP: 111/59  Pulse:   Temp:   Resp: 26    Complications: No apparent anesthesia complications

## 2014-08-21 NOTE — Brief Op Note (Signed)
08/21/2014  1:18 PM  PATIENT:  Alicia Coffey  43 y.o. female  PRE-OPERATIVE DIAGNOSIS:  menorrhagia- N92.0, anemia- D64.9  POST-OPERATIVE DIAGNOSIS:  menorrhagia- N92.0, anemia- D64.9, endometrial polyp  PROCEDURE:  Procedure(s): DILATATION & CURETTAGE/HYSTEROSCOPY WITH THERMACHOICE ABLATION (N/A) ENDOMETRIAL POLYPECTOMY (N/A)  SURGEON:  Surgeon(s) and Role:    * Jonnie Kind, MD - Primary  PHYSICIAN ASSISTANT:   ASSISTANTS: BLackwell, CST    ANESTHESIA:   general and paracervical block  EBL:  Total I/O In: 1000 [I.V.:1000] Out: -   BLOOD ADMINISTERED:none  DRAINS: none   LOCAL MEDICATIONS USED:  MARCAINE    and Amount: 20 ml  SPECIMEN:  Source of Specimen:  endometrial curettings,   DISPOSITION OF SPECIMEN:  PATHOLOGY  COUNTS:  YES  TOURNIQUET:  * No tourniquets in log *  DICTATION: .Dragon Dictation  PLAN OF CARE: Discharge to home after PACU  PATIENT DISPOSITION:  PACU - hemodynamically stable.   Delay start of Pharmacological VTE agent (>24hrs) due to surgical blood loss or risk of bleeding: not applicable

## 2014-08-21 NOTE — H&P (Signed)
Preoperative History and Physical  Alicia Coffey is a 43 y.o. female here for surgical management of endometrial ablation. She states that she has decided to do the ablation. She states that her periods are very heavy to where she will have to change four times within 4 hours. She states that her periods leave her weak and tired. She states that she has anemia and her renal doctor is concerned for the blood that is lost. She states that she has proteinuria. She states that this is because of her DM.   Proposed surgery: schedule for 3-4 weeks from today.   Past Medical History  Diagnosis Date  . Hyperlipidemia   . Hypertension     under control, has been on med. > 1 yr.  . Diabetes mellitus     IDDM  . 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 Arthritis     right knee  . Wears dentures     upper  . Wears partial dentures     lower  . 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 Disc degeneration, lumbar   . Anemia   . Fibromyalgia   . Vertigo 2015   Past Surgical History  Procedure Laterality Date  . No past surgeries    . Breast lumpectomy with needle localization  08/15/2012    Procedure: BREAST LUMPECTOMY WITH NEEDLE LOCALIZATION; Surgeon: Haywood Lasso, MD; Location: [facility]; Service: General; Laterality: Right; Wire localizations Right breast calcifications  . Breast surgery    . Refractive surgery Right    OB History  Gravida Para Term Preterm AB SAB TAB Ectopic Multiple Living  0                Patient denies any cervical dysplasia or STIs.   Current outpatient prescriptions:amitriptyline (ELAVIL) 75 MG tablet, Take 75 mg by mouth at bedtime., Disp: , Rfl: ; Cholecalciferol (VITAMIN D3 PO), Take 2 capsules by mouth 2 (two) times daily., Disp: , Rfl: ; cyclobenzaprine (FLEXERIL) 10 MG tablet, Take 10 mg by mouth at bedtime.,  Disp: , Rfl: ; DULoxetine (CYMBALTA) 60 MG capsule, Take 1 capsule (60 mg total) by mouth daily., Disp: 30 capsule, Rfl: 12 fish oil-omega-3 fatty acids 1000 MG capsule, Take 2 g by mouth daily., Disp: , Rfl: ; glipiZIDE (GLUCOTROL) 5 MG tablet, Take by mouth daily before breakfast., Disp: , Rfl: ; insulin aspart (NOVOLOG) 100 UNIT/ML injection, Inject 1-4 Units into the skin 3 (three) times daily before meals. Sliding scale CBG: 101-150 - 1 unit, 151-201 2 units, 202-250 3 untis, 251- 301 4 units, Disp: , Rfl:  insulin glargine (LANTUS) 100 UNIT/ML injection, Inject 26 Units into the skin at bedtime. , Disp: , Rfl: ; IRON, FERROUS GLUCONATE, PO, Take 65 mg by mouth daily., Disp: , Rfl: ; lisinopril-hydrochlorothiazide (PRINZIDE,ZESTORETIC) 20-25 MG per tablet, Take 1 tablet by mouth 2 (two) times daily., Disp: , Rfl: ; Magnesium 250 MG TABS, Take by mouth., Disp: , Rfl: ; MECLIZINE HCL PO, Take 1 tablet by mouth daily., Disp: , Rfl:  metFORMIN (GLUCOPHAGE) 1000 MG tablet, Take 1,000 mg by mouth 2 times daily at 12 noon and 4 pm., Disp: , Rfl: ; rosuvastatin (CRESTOR) 40 MG tablet, Take 40 mg by mouth daily., Disp: , Rfl: ; tamoxifen (NOLVADEX) 20 MG tablet, TAKE 1 TABLET BY MOUTH ONCE DAILY, Disp: 30 tablet, Rfl: 2; traMADol (ULTRAM) 50 MG tablet, Take 50 mg by mouth every 6 (six) hours as needed.,  Disp: , Rfl:    Allergies  Allergen Reactions  . Other Itching    Powered gloves  . Penicillins   . History  Substance Use Topics  . Smoking status: Never Smoker   . Smokeless tobacco: Never Used  . Alcohol Use: No   Patient Active Problem List   Diagnosis Date Noted  . Follow up 05/23/2014  . Endometrial hyperplasia 04/04/2014  . Fibroid, uterine 04/04/2014  . Abnormal uterine bleeding (AUB) 03/26/2014  . Diabetic peripheral neuropathy 12/21/2013  . Low back pain 12/11/2013  . Pain in joint, shoulder region 12/11/2013  . Pain in  limb 12/11/2013  . 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 Diabetes 07/28/2012  . Hypertension 07/28/2012  . Hypercholesteremia 07/28/2012  . 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 Facility age limit for growth percentiles is 20 years., There were no vitals filed for this visit., No LMP recorded.  Physical Examination: Pelvic - normal external genitalia, vulva, vagina, cervix, uterus and adnexa,  VULVA: normal appearing vulva with no masses, tenderness or lesions,  VAGINA: normal appearing vagina with normal color and discharge, no lesions,  CERVIX: normal appearing cervix without discharge or lesions,  UTERUS: uterus is normal size, shape, consistency and nontender,  ADNEXA: normal adnexa in size, nontender and no masses  Labs:  CBC    Component Value Date/Time   WBC 9.2 08/16/2014 1000   WBC 11.3* 09/11/2013 0911   RBC 3.24* 08/16/2014 1000   RBC 3.49* 09/11/2013 0911   HGB 9.3* 08/16/2014 1000   HGB 9.9* 09/11/2013 0911   HCT 28.3* 08/16/2014 1000   HCT 30.5* 09/11/2013 0911   PLT 225 08/16/2014 1000   PLT 169 09/11/2013 0911   MCV 87.3 08/16/2014 1000   MCV 87.4 09/11/2013 0911   MCH 28.7 08/16/2014 1000   MCH 28.3 09/11/2013 0911   MCHC 32.9 08/16/2014 1000   MCHC 32.3 09/11/2013 0911   RDW 13.2 08/16/2014 1000   RDW 13.4 09/11/2013 0911   LYMPHSABS 3.3 08/16/2014 1000   LYMPHSABS 3.7* 09/11/2013 0911   MONOABS 0.7 08/16/2014 1000   MONOABS 0.8 09/11/2013 0911   EOSABS 0.4 08/16/2014 1000   EOSABS 0.8* 09/11/2013 0911   BASOSABS 0.0 08/16/2014 1000   BASOSABS 0.1 09/11/2013 0911   BMET    Component Value Date/Time   NA 142 08/16/2014 1000   NA 142 09/11/2013 0911   K 4.6 08/16/2014 1000   K 4.1 09/11/2013 0911   CL 103 08/16/2014 1000   CL 106  01/09/2013 0855   CO2 28 08/16/2014 1000   CO2 24 09/11/2013 0911   GLUCOSE 170* 08/16/2014 1000   GLUCOSE 207* 09/11/2013 0911   GLUCOSE 169* 01/09/2013 0855   BUN 19 08/16/2014 1000   BUN 34.4* 09/11/2013 0911   CREATININE 1.15* 08/16/2014 1000   CREATININE 1.6* 09/11/2013 0911   CALCIUM 9.2 08/16/2014 1000   CALCIUM 9.7 09/11/2013 0911   GFRNONAA 57* 08/16/2014 1000   GFRAA 66* 08/16/2014 1000   Serum hcg negative 08/16/14 Endometrial biopsy benign 03/2014  Imaging Studies:    Assessment:  Patient Active Problem List   Diagnosis Date Noted  . Follow up 05/23/2014  . Endometrial hyperplasia 04/04/2014  . Fibroid, uterine 04/04/2014  . Abnormal uterine bleeding (AUB) 03/26/2014  . Diabetic peripheral neuropathy 12/21/2013  .  Low back pain 12/11/2013  . Pain in joint, shoulder region 12/11/2013  . Pain in limb 12/11/2013  . 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 Diabetes 07/28/2012  . Hypertension 07/28/2012  . Hypercholesteremia 07/28/2012  . 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 Menorrhagia 2. Anemia . 3. IDDM on Lantus + Novolog   Plan:  Patient will undergo surgical management with endometrial ablation. The risks of surgery were discussed in detail with the patient including but not limited to: bleeding which may require transfusion or reoperation; infection which may require antibiotics; injury to surrounding organs which may involve bowel, bladder, ureters ; need for additional procedures including laparoscopy or laparotomy; thromboembolic phenomenon, surgical site problems and other postoperative/anesthesia complications. Likelihood of success in alleviating the patient's condition was discussed. Routine postoperative instructions will be reviewed with the patient and her family in detail after surgery. The patient concurred with the proposed plan, giving informed written consent  for the surgery. Patient has been NPO since last night she will remain NPO for procedure.  SCDs ordered on call to the OR. To OR when ready.

## 2014-08-21 NOTE — Op Note (Signed)
10/21/2013  11:10 AM  PATIENT:  Alicia Coffey  43 y.o. female  PRE-OPERATIVE DIAGNOSIS:  menorrhagia- N92.0, anemia- D64.9 POST-OPERATIVE DIAGNOSIS:  menorrhagia- N92.0, anemia- D64.9, endometrial polyp  PROCEDURE:  Procedure(s): DILATATION & CURETTAGE/HYSTEROSCOPY WITH THERMACHOICE ABLATION (N/A) ENDOMETRIAL POLYPECTOMY (N/A)  SURGEON:  Surgeon(s) and Role:    * Jonnie Kind, MD - Primary  PHYSICIAN ASSISTANT:  ASSISTANTS: Blackwell CST  ANESTHESIA:   local and paracervical block  EBL:  Total I/O In: 1000 [I.V.:1000] Out: -  BLOOD ADMINISTERED:none  DRAINS: none  LOCAL MEDICATIONS USED:  MARCAINE    and Amount: 20 ml  SPECIMEN:  Source of Specimen:  endometrial curettings and endometrial polyp DISPOSITION OF SPECIMEN:  PATHOLOGY  COUNTS:  YES TOURNIQUET:  * No tourniquets in log *  DICTATION: .Dragon Dictation PLAN OF CARE: Discharge to home after PACU PATIENT DISPOSITION:  PACU - hemodynamically stable.  CBG preprocedure was 178 mg per deciliter Delay start of Pharmacological VTE agent (>24hrs) due to surgical blood loss or risk of bleeding: not applicable Details of procedure: Patient was taken operating room prepped and draped for vaginal procedure timeout conducted, and procedure confirmed by surgical team. Paracervical block was applied after speculum inserted and then the uterus was sounded to 9 cm in the anteverted position. There was a probable small fibroid in the lower uterine segment, deviating the endocervical canal to the patient's left. This did not interfere with the procedure. The uterus handed to 9 cm, dilated to 23 [place] allowing introduction of the rigid 30 operative hysteroscope visualizing both tubal ostia, and identifying small endometrial polyp just in inferior to the left tubal ostia. This was biopsied and excised and included in the specimen. Smooth curettage obtaining minimal additional tissue.  Paracervical block was applied using 20 cc of  Marcaine with epinephrine.The Gynecare ThermaChoice 3 endometrial ablation device was prepared activated, filled with 15 cc of D5W and the 8 minute thermal ablation sequence activated and completed under119mmHg pressure. Fluid was removed all was accounted for, cervix inspected there was a minimal tearing from the single-tooth tenaculum used to hold the cervix daily during the procedure, and patient went to recovery room in stable condition.

## 2014-08-21 NOTE — H&P (Deleted)
  This is a test. Please refer to prior h& P

## 2014-08-21 NOTE — Discharge Instructions (Signed)
Endometrial Ablation Endometrial ablation removes the lining of the uterus (endometrium). It is usually a same-day, outpatient treatment. Ablation helps avoid major surgery, such as surgery to remove the cervix and uterus (hysterectomy). After endometrial ablation, you will have little or no menstrual bleeding and may not be able to have children. However, if you are premenopausal, you will need to use a reliable method of birth control following the procedure because of the small chance that pregnancy can occur. There are different reasons to have this procedure, which include:  Heavy periods.  Bleeding that is causing anemia.  Irregular bleeding.  Bleeding fibroids on the lining inside the uterus if they are smaller than 3 centimeters. This procedure should not be done if:  You want children in the future.  You have severe cramps with your menstrual period.  You have precancerous or cancerous cells in your uterus.  You were recently pregnant.  You have gone through menopause.  You have had major surgery on the uterus, such as a cesarean delivery. LET YOUR HEALTH CARE PROVIDER KNOW ABOUT:  Any allergies you have.  All medicines you are taking, including vitamins, herbs, eye drops, creams, and over-the-counter medicines.  Previous problems you or members of your family have had with the use of anesthetics.  Any blood disorders you have.  Previous surgeries you have had.  Medical conditions you have. RISKS AND COMPLICATIONS  Generally, this is a safe procedure. However, as with any procedure, complications can occur. Possible complications include:  Perforation of the uterus.  Bleeding.  Infection of the uterus, bladder, or vagina.  Injury to surrounding organs.  An air bubble to the lung (air embolus).  Pregnancy following the procedure.  Failure of the procedure to help the problem, requiring hysterectomy.  Decreased ability to diagnose cancer in the lining of  the uterus. BEFORE THE PROCEDURE  The lining of the uterus must be tested to make sure there is no pre-cancerous or cancer cells present.  An ultrasound may be performed to look at the size of the uterus and to check for abnormalities.  Medicines may be given to thin the lining of the uterus. PROCEDURE  During the procedure, your health care provider will use a tool called a resectoscope to help see inside your uterus. There are different ways to remove the lining of your uterus.   Radiofrequency - This method uses a radiofrequency-alternating electric current to remove the lining of the uterus.  Cryotherapy - This method uses extreme cold to freeze the lining of the uterus.  Heated-Free Liquid - This method uses heated salt (saline) solution to remove the lining of the uterus.  Microwave - This method uses high-energy microwaves to heat up the lining of the uterus to remove it.  Thermal balloon - This method involves inserting a catheter with a balloon tip into the uterus. The balloon tip is filled with heated fluid to remove the lining of the uterus. AFTER THE PROCEDURE  After your procedure, do not have sexual intercourse or insert anything into your vagina until permitted by your health care provider. After the procedure, you may experience:  Cramps.  Vaginal discharge.  Frequent urination. Document Released: 08/07/2004 Document Revised: 05/31/2013 Document Reviewed: 03/01/2013 Exit[employer] Patient Information 2015 [employer]. This information is not intended to replace advice given to you by your health care provider. Make sure you discuss any questions you have with your health care provider.  

## 2014-08-21 NOTE — Transfer of Care (Signed)
Immediate Anesthesia Transfer of Care Note  Patient: Alicia Coffey  Procedure(s) Performed: Procedure(s): DILATATION & CURETTAGE/HYSTEROSCOPY WITH THERMACHOICE ABLATION (N/A) ENDOMETRIAL POLYPECTOMY (N/A)  Patient Location: PACU  Anesthesia Type:General  Level of Consciousness: awake, oriented, patient cooperative and responds to stimulation  Airway & Oxygen Therapy: Patient Spontanous Breathing and Patient connected to face mask oxygen  Post-op Assessment: Report given to PACU RN, Post -op Vital signs reviewed and stable and Patient moving all extremities  Post vital signs: Reviewed and stable  Complications: No apparent anesthesia complications

## 2014-08-21 NOTE — Transfer of Care (Signed)
Immediate Anesthesia Transfer of Care Note  Patient: NOEMI BELLISSIMO  Procedure(s) Performed: Procedure(s): DILATATION & CURETTAGE/HYSTEROSCOPY WITH THERMACHOICE ABLATION (N/A) ENDOMETRIAL POLYPECTOMY (N/A)  Patient Location: PACU  Anesthesia Type:General  Level of Consciousness: sedated and patient cooperative  Airway & Oxygen Therapy: Patient Spontanous Breathing and Patient connected to face mask oxygen  Post-op Assessment: Report given to PACU RN, Post -op Vital signs reviewed and stable, Patient moving all extremities and Patient moving all extremities X 4  Post vital signs: Reviewed and stable  Complications: No apparent anesthesia complications

## 2014-08-21 NOTE — Op Note (Signed)
  PATIENT:  Alicia Coffey  43 y.o. female  PRE-OPERATIVE DIAGNOSIS:  menorrhagia- N92.0, anemia- D64.9  POST-OPERATIVE DIAGNOSIS:  menorrhagia- N92.0, anemia- D64.9, endometrial polyp  PROCEDURE:  Procedure(s): DILATATION & CURETTAGE/HYSTEROSCOPY WITH THERMACHOICE ABLATION (N/A) ENDOMETRIAL POLYPECTOMY (N/A)  SURGEON:  Surgeon(s) and Role:    * Jonnie Kind, MD - Primary  PHYSICIAN ASSISTANT:   ASSISTANTS: BLackwell, CST    ANESTHESIA:   general and paracervical block  EBL:  Total I/O In: 1000 [I.V.:1000] Out: -   BLOOD ADMINISTERED:none  DRAINS: none   LOCAL MEDICATIONS USED:  MARCAINE    and Amount: 20 ml  SPECIMEN:  Source of Specimen:  endometrial curettings,   DISPOSITION OF SPECIMEN:  PATHOLOGY  COUNTS:  YES  TOURNIQUET:  * No tourniquets in log *  DICTATION: .Dragon Dictation  PLAN OF CARE: Discharge to home after PACU  PATIENT DISPOSITION:  PACU - hemodynamically stable.   Delay start of Pharmacological VTE agent (>24hrs) due to surgical blood loss or risk of bleeding: not applicable  Details of procedure: The patient was taken to the OR, prepped, draped, and timeout conducted with procedure confirmed by surgical team. Pre Op antibiotics were not administered. The Speculum was inserted, cervix grasped with single-tooth tenaculum, and the uterus sounded to 9 cm, in the anteflexed position, and the endocervical canal dilated to 23 [place], allowing introduction of the rigid 30 degree hysteroscope, which identified both tubal ostia. There was no indication of perforation. Additional findings:small polyp on left side near left cornu Additional procedures:none Curetting was performed, obtaining a tissue sample, sent for analysis.  The Gynecare Thermachoice III endometrial ablation device was tested, inserted, and filled with 15 cc of D5W, achieving greater than 150 mm Hg pressure, and the 8-minute ablation sequence completed and the same amount of fluid  recovered.  Paracervical block with 20 cc of Marcaine injected at 3,5,7,and 9 o'clock. EBL : minimal Condition to recovery: good Sponge counts: correct.

## 2014-08-21 NOTE — Anesthesia Procedure Notes (Signed)
Procedure Name: Intubation Date/Time: 08/21/2014 9:57 AM Performed by: Vista Deck Pre-anesthesia Checklist: Patient identified, Patient being monitored, Timeout performed, Emergency Drugs available and Suction available Patient Re-evaluated:Patient Re-evaluated prior to inductionOxygen Delivery Method: Circle System Utilized Preoxygenation: Pre-oxygenation with 100% oxygen Intubation Type: IV induction Ventilation: Mask ventilation without difficulty Laryngoscope Size: Miller and 2 Grade View: Grade I Tube type: Oral Tube size: 7.0 mm Number of attempts: 1 Airway Equipment and Method: stylet Placement Confirmation: ETT inserted through vocal cords under direct vision,  positive ETCO2 and breath sounds checked- equal and bilateral Secured at: 21 cm Tube secured with: Tape Dental Injury: Teeth and Oropharynx as per pre-operative assessment

## 2014-08-22 ENCOUNTER — Encounter (HOSPITAL_COMMUNITY): Payer: Self-pay | Admitting: Obstetrics and Gynecology

## 2014-08-22 NOTE — Anesthesia Postprocedure Evaluation (Signed)
  Anesthesia Post-op Note  Patient: Alicia Coffey  Procedure(s) Performed: Procedure(s): DILATATION & CURETTAGE/HYSTEROSCOPY WITH THERMACHOICE ABLATION (N/A) ENDOMETRIAL POLYPECTOMY (N/A)  Patient Location: PACU  Anesthesia Type:General  Level of Consciousness: awake, alert , oriented and patient cooperative  Airway and Oxygen Therapy: Patient Spontanous Breathing  Post-op Pain: none  Post-op Assessment: Post-op Vital signs reviewed, Patient's Cardiovascular Status Stable, Respiratory Function Stable, Patent Airway, No signs of Nausea or vomiting and Adequate PO intake  Post-op Vital Signs: Reviewed and stable  Last Vitals:  Filed Vitals:   08/21/14 1156  BP: 140/84  Pulse: 90  Temp: 36.6 C  Resp: 20    Complications: No apparent anesthesia complications

## 2014-08-22 NOTE — Addendum Note (Signed)
Addendum  created 08/22/14 1341 by Charmaine Downs, CRNA   Modules edited: Notes Section   Notes Section:  File: [id number]

## 2014-08-28 ENCOUNTER — Ambulatory Visit (INDEPENDENT_AMBULATORY_CARE_PROVIDER_SITE_OTHER): Payer: 59 | Admitting: Obstetrics and Gynecology

## 2014-08-28 VITALS — BP 136/70 | Ht 63.0 in | Wt 247.0 lb

## 2014-08-28 DIAGNOSIS — Z9889 Other specified postprocedural states: Secondary | ICD-10-CM

## 2014-08-28 NOTE — Progress Notes (Signed)
Patient ID: ALLONA GONDEK, female   DOB: [birth year], 43 y.o.   MRN: [id number] Pt here today for post op. Pt states that the only problem she has had is her right upper arm is hurting.     Subjective:  ZOI DEVINE is a 43 y.o. female who presents to the clinic 2 weeks status post operative hysteroscopy and with endo ablaiton.   watery d/c, notes ache in rt arm,  Review of Systems Negative except right arm ache  She has been eating a regular diet without difficulty.   Bowel movements are normal. The patient is not having any pain.  Objective:  BP 136/70 mmHg  Ht 5\' 3"  (1.6 m)  Wt 247 lb (112.038 kg)  BMI 43.76 kg/m2  LMP 08/07/2014 (Approximate) General:Well developed, well nourished.  No acute distress. Abdomen: Bowel sounds normal, soft, non-tender. Pelvic Exam:    External Genitalia:  Normal.    Vagina: Normal    Bimanual: Normal    Cervix: lite d/cNormal    Uterus: Normal    Adnexa: Normal     Assessment:  Post-Op 1 weeks s/p operative hysteroscopy   normal healing Doing well postoperatively.   Plan:  1. 2. .Continue any current medications. 3. Activity restrictions: unchanged 4. return to work: not applicable. 5. Follow up in prn weeks.

## 2014-09-12 ENCOUNTER — Other Ambulatory Visit: Payer: Self-pay

## 2014-09-12 DIAGNOSIS — N6099 Unspecified benign mammary dysplasia of unspecified breast: Secondary | ICD-10-CM

## 2014-09-13 ENCOUNTER — Other Ambulatory Visit (HOSPITAL_BASED_OUTPATIENT_CLINIC_OR_DEPARTMENT_OTHER): Payer: 59

## 2014-09-13 ENCOUNTER — Telehealth: Payer: Self-pay | Admitting: Hematology and Oncology

## 2014-09-13 ENCOUNTER — Ambulatory Visit (HOSPITAL_BASED_OUTPATIENT_CLINIC_OR_DEPARTMENT_OTHER): Payer: 59 | Admitting: Hematology and Oncology

## 2014-09-13 VITALS — BP 112/73 | HR 90 | Temp 98.3°F | Resp 19 | Ht 63.0 in | Wt 247.4 lb

## 2014-09-13 DIAGNOSIS — Z853 Personal history of malignant neoplasm of breast: Secondary | ICD-10-CM

## 2014-09-13 DIAGNOSIS — D509 Iron deficiency anemia, unspecified: Secondary | ICD-10-CM

## 2014-09-13 DIAGNOSIS — N6099 Unspecified benign mammary dysplasia of unspecified breast: Secondary | ICD-10-CM

## 2014-09-13 DIAGNOSIS — D0501 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: Secondary | ICD-10-CM

## 2014-09-13 DIAGNOSIS — D500 Iron deficiency anemia secondary to blood loss (chronic): Secondary | ICD-10-CM

## 2014-09-13 LAB — CBC WITH DIFFERENTIAL/PLATELET
BASO%: 0.4 % (ref 0.0–2.0)
Basophils Absolute: 0 10*3/uL (ref 0.0–0.1)
EOS ABS: 0.5 10*3/uL (ref 0.0–0.5)
EOS%: 5.7 % (ref 0.0–7.0)
HEMATOCRIT: 33 % — AB (ref 34.8–46.6)
HGB: 10.4 g/dL — ABNORMAL LOW (ref 11.6–15.9)
LYMPH%: 42.5 % (ref 14.0–49.7)
MCH: 28.4 pg (ref 25.1–34.0)
MCHC: 31.5 g/dL (ref 31.5–36.0)
MCV: 90.2 fL (ref 79.5–101.0)
MONO#: 0.7 10*3/uL (ref 0.1–0.9)
MONO%: 7.2 % (ref 0.0–14.0)
NEUT#: 4.1 10*3/uL (ref 1.5–6.5)
NEUT%: 44.2 % (ref 38.4–76.8)
PLATELETS: 185 10*3/uL (ref 145–400)
RBC: 3.66 10*6/uL — ABNORMAL LOW (ref 3.70–5.45)
RDW: 13.3 % (ref 11.2–14.5)
WBC: 9.2 10*3/uL (ref 3.9–10.3)
lymph#: 3.9 10*3/uL — ABNORMAL HIGH (ref 0.9–3.3)

## 2014-09-13 LAB — COMPREHENSIVE METABOLIC PANEL (CC13)
ALT: 18 U/L (ref 0–55)
ANION GAP: 11 meq/L (ref 3–11)
AST: 18 U/L (ref 5–34)
Albumin: 3.6 g/dL (ref 3.5–5.0)
Alkaline Phosphatase: 73 U/L (ref 40–150)
BILIRUBIN TOTAL: 0.24 mg/dL (ref 0.20–1.20)
BUN: 20.8 mg/dL (ref 7.0–26.0)
CO2: 26 meq/L (ref 22–29)
CREATININE: 1.3 mg/dL — AB (ref 0.6–1.1)
Calcium: 9.3 mg/dL (ref 8.4–10.4)
Chloride: 105 mEq/L (ref 98–109)
EGFR: 59 mL/min/{1.73_m2} — AB (ref >90)
GLUCOSE: 202 mg/dL — AB (ref 70–140)
Potassium: 4.1 mEq/L (ref 3.5–5.1)
Sodium: 141 mEq/L (ref 136–145)
Total Protein: 6.9 g/dL (ref 6.4–8.3)

## 2014-09-13 NOTE — Progress Notes (Signed)
Patient Care Team: Claris Gladden. Ouida Sills, FNP as PCP - General (Nurse Practitioner)  Diagnosis: LCIS right breast  PRIOR THERAPY: #1She recently had a mammogram performed that showed abnormality in the right breast. Because of this she went on to have a needle core biopsy performed that showed only atypical lobular hyperplasia. She was seen by Dr. Neldon Mc who performed a lumpectomy on 08/15/2012. The final pathology of the right lumpectomy showed lobular neoplasia (lobular carcinoma in situ) with calcifications fibrocystic changes with calcifications. Margins were negative  #2 patient was seen in the high-risk clinic for discussion of risk reducing strategies including chemoprevention with tamoxifen 20 mg daily. We also had discussed lifestyle modifications including exercise increasing it to 5-6 days a week with cardio as well as strength training. We discussed weight reduction and the rationale for it. She also was counseled regarding nutritional counseling. We had discussed surveillance mammograms as well as clinical breast examinations and self breast examinations.   CURRENT THERAPY: tamoxifen 20 mg daily since December 2013.  CHIEF COMPLIANT: Hot flashes, neuropathy, fibromyalgia symptoms, anemia  INTERVAL HISTORY: Alicia Coffey is a 43 -year-old [place] American lady with above-mentioned history of LCIS in the right breast currently being seen at high risk breast clinic for annual followup. He was placed on tamoxifen and appears to be tolerating it fairly well from excessive uterine bleeding which was recently treated with uterine ablation. Since then her bleeding is not controlled. She does have occasional hot flashes which are clinic with cold-like symptoms related to her chronic anemia. She is a diabetic and has mild chronic kidney disease.she was diagnosis ER with fibromyalgia as well as neuropathic pain which needs to difficulty with walking.  REVIEW OF SYSTEMS:   Constitutional:  Denies fevers, chills or abnormal weight loss Eyes: Denies blurriness of vision Ears, nose, mouth, throat, and face: Denies mucositis or sore throat Respiratory: Denies cough, dyspnea or wheezes Cardiovascular: Denies palpitation, chest discomfort or lower extremity swelling Gastrointestinal:  Denies nausea, heartburn or change in bowel habits Skin: Denies abnormal skin rashes Lymphatics: Denies new lymphadenopathy or easy bruising Neurological:neuropathy tender spots and weakness in the legs Behavioral/Psych: Mood is stable, no new changes  Breast:  denies any pain or lumps or nodules in either breasts All other systems were reviewed with the patient and are negative.  I have reviewed the past medical history, past surgical history, social history and family history with the patient and they are unchanged from previous note.  ALLERGIES:  is allergic to other and penicillins.  MEDICATIONS:  Current Outpatient Prescriptions  Medication Sig Dispense Refill  . acetaminophen (TYLENOL) 500 MG tablet Take 1,000 mg by mouth every 6 (six) hours as needed for mild pain.    Marland Kitchen amitriptyline (ELAVIL) 75 MG tablet Take 75 mg by mouth at bedtime.    . cyclobenzaprine (FLEXERIL) 10 MG tablet Take 10 mg by mouth at bedtime.    . DULoxetine (CYMBALTA) 60 MG capsule Take 1 capsule (60 mg total) by mouth daily. 30 capsule 12  . fish oil-omega-3 fatty acids 1000 MG capsule Take 1 g by mouth 2 (two) times daily.     Marland Kitchen glipiZIDE (GLUCOTROL) 5 MG tablet Take by mouth daily before breakfast.    . insulin aspart (NOVOLOG) 100 UNIT/ML injection Inject 1-4 Units into the skin 3 (three) times daily before meals. Sliding scale CBG: 101-150 - 1 unit, 151-201 2 units, 202-250 3 untis, 251- 301 4 units    . insulin glargine (LANTUS) 100 UNIT/ML  injection Inject 32 Units into the skin at bedtime.     . IRON, FERROUS GLUCONATE, PO Take 65 mg by mouth daily.    Marland Kitchen lisinopril-hydrochlorothiazide (PRINZIDE,ZESTORETIC) 20-25  MG per tablet Take 1 tablet by mouth 2 (two) times daily.    . Magnesium 250 MG TABS Take 1 tablet by mouth 2 (two) times daily.     . MECLIZINE HCL PO Take 1 tablet by mouth daily.    . metFORMIN (GLUCOPHAGE) 1000 MG tablet Take 1,000 mg by mouth 2 times daily at 12 noon and 4 pm.    . oxyCODONE-acetaminophen (PERCOCET/ROXICET) 5-325 MG per tablet Take 1 tablet by mouth every 4 (four) hours as needed. 20 tablet 0  . rosuvastatin (CRESTOR) 40 MG tablet Take 40 mg by mouth daily.    . tamoxifen (NOLVADEX) 20 MG tablet TAKE 1 TABLET BY MOUTH ONCE DAILY 30 tablet 2  . traMADol (ULTRAM) 50 MG tablet Take 50 mg by mouth every 6 (six) hours as needed for moderate pain.      No current facility-administered medications for this visit.    PHYSICAL EXAMINATION: ECOG PERFORMANCE STATUS: 1 - Symptomatic but completely ambulatory  Filed Vitals:   09/13/14 1005  BP: 112/73  Pulse: 90  Temp: 98.3 F (36.8 C)  Resp: 19   Filed Weights   09/13/14 1005  Weight: 247 lb 6.4 oz (112.22 kg)    GENERAL:alert, no distress and comfortable SKIN: skin color, texture, turgor are normal, no rashes or significant lesions EYES: normal, Conjunctiva are pink and non-injected, sclera clear OROPHARYNX:no exudate, no erythema and lips, buccal mucosa, and tongue normal  NECK: supple, thyroid normal size, non-tender, without nodularity LYMPH:  no palpable lymphadenopathy in the cervical, axillary or inguinal LUNGS: clear to auscultation and percussion with normal breathing effort HEART: regular rate & rhythm and no murmurs and no lower extremity edema ABDOMEN:abdomen soft, non-tender and normal bowel sounds Musculoskeletal:no cyanosis of digits and no clubbing  NEURO: alert & oriented x 3 with fluent speech, no focal motor/sensory deficits BREAST: No palpable masses or nodules in either right or left breasts. No palpable axillary supraclavicular or infraclavicular adenopathy no breast tenderness or nipple  discharge.   LABORATORY DATA:  I have reviewed the data as listed   Chemistry      Component Value Date/Time   NA 142 08/16/2014 1000   NA 142 09/11/2013 0911   K 4.6 08/16/2014 1000   K 4.1 09/11/2013 0911   CL 103 08/16/2014 1000   CL 106 01/09/2013 0855   CO2 28 08/16/2014 1000   CO2 24 09/11/2013 0911   BUN 19 08/16/2014 1000   BUN 34.4* 09/11/2013 0911   CREATININE 1.15* 08/16/2014 1000   CREATININE 1.6* 09/11/2013 0911      Component Value Date/Time   CALCIUM 9.2 08/16/2014 1000   CALCIUM 9.7 09/11/2013 0911   ALKPHOS 45 09/11/2013 0911   AST 18 09/11/2013 0911   ALT 13 09/11/2013 0911   BILITOT 0.40 09/11/2013 0911       Lab Results  Component Value Date   WBC 9.2 09/13/2014   HGB 10.4* 09/13/2014   HCT 33.0* 09/13/2014   MCV 90.2 09/13/2014   PLT 185 09/13/2014   NEUTROABS 4.1 09/13/2014     RADIOGRAPHIC STUDIES: I have personally reviewed the radiology reports and agreed with their findings. 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 Uterine hyperplasia/followup status post ablation recently 2. Hot flashes  Patient was also recently diagnosed with fibromyalgia and neuropathy and is on multiple medications for that.  Surveillance: Mammograms done in March 2015 were reviewed and were normal. Breast breast exam is normal.return to clinic one year for followup I recommended that she should undergo 3-D mammograms because she has had greasy breast density. Patient will save up enough money and try to get the 3-D mammogram for the next year.  Iron deficiency anemia due to chronic blood loss Normocytic anemia: Patient had a recent ablation surgery and she stopped having heavy menstrual periods. I suspect anemia may be related to either anemia of chronic kidney disease versus  anemia of chronic inflammation. Her hemoglobin is slightly improved from a month ago. We will repeat iron studies O03 folic acid within next lab work to be done here.   Orders Placed This Encounter  Procedures  . CBC with Differential    Standing Status: Future     Number of Occurrences:      Standing Expiration Date: 09/13/2015  . Iron and TIBC [facility]    Standing Status: Future     Number of Occurrences:      Standing Expiration Date: 09/13/2015  . Folate, Serum    Standing Status: Future     Number of Occurrences:      Standing Expiration Date: 09/13/2015  . Vitamin B12    Standing Status: Future     Number of Occurrences:      Standing Expiration Date: 09/13/2015  . Reticulocyte Count    Standing Status: Future     Number of Occurrences:      Standing Expiration Date: 09/13/2015   The patient has a good understanding of the overall plan. she agrees with it. She will call with any problems that may develop before her next visit here.   Rulon Eisenmenger, MD 09/13/2014 10:41 AM

## 2014-09-13 NOTE — Assessment & Plan Note (Signed)
Normocytic anemia: Patient had a recent ablation surgery and she stopped having heavy menstrual periods. I suspect anemia may be related to either anemia of chronic kidney disease versus anemia of chronic inflammation. Her hemoglobin is slightly improved from a month ago. We will repeat iron studies H88 folic acid within next lab work to be done here.

## 2014-09-13 NOTE — Assessment & Plan Note (Signed)
Right breast LCIS: Status post lumpectomy 08/15/2012 and is currently on tamoxifen 20 mg daily Side effects of tamoxifen 1. Uterine hyperplasia/followup status post ablation recently 2. Hot flashes  Patient was also recently diagnosed with fibromyalgia and neuropathy and is on multiple medications for that.  Surveillance: Mammograms done in March 2015 were reviewed and were normal. Breast breast exam is normal.return to clinic one year for followup

## 2014-09-13 NOTE — Telephone Encounter (Signed)
m °

## 2014-11-21 ENCOUNTER — Other Ambulatory Visit: Payer: Self-pay | Admitting: *Deleted

## 2014-11-21 DIAGNOSIS — N62 Hypertrophy of breast: Secondary | ICD-10-CM

## 2014-11-21 DIAGNOSIS — D0590 Unspecified type of carcinoma in situ of unspecified breast: Secondary | ICD-10-CM

## 2014-11-21 MED ORDER — TAMOXIFEN CITRATE 20 MG PO TABS: 20.0000 mg | ORAL_TABLET | Freq: Every day | ORAL | Status: DC

## 2014-11-21 NOTE — Telephone Encounter (Signed)
Called patient and let her know refill for Tamoxifen has been sent to [facility]. Patient agreeable to pick up prescription.

## 2015-01-03 ENCOUNTER — Other Ambulatory Visit: Payer: Self-pay | Admitting: Family Medicine

## 2015-01-03 DIAGNOSIS — Z1231 Encounter for screening mammogram for malignant neoplasm of breast: Secondary | ICD-10-CM

## 2015-01-07 ENCOUNTER — Ambulatory Visit
Admission: RE | Admit: 2015-01-07 | Discharge: 2015-01-07 | Disposition: A | Discharge status: Home / Self Care | Payer: Medicaid Other | Source: Ambulatory Visit | Attending: Family Medicine | Admitting: Family Medicine

## 2015-01-07 DIAGNOSIS — Z1231 Encounter for screening mammogram for malignant neoplasm of breast: Secondary | ICD-10-CM

## 2015-01-30 ENCOUNTER — Emergency Department (HOSPITAL_COMMUNITY)
Admission: EM | Admit: 2015-01-30 | Discharge: 2015-01-30 | Disposition: A | Discharge status: Home / Self Care | Payer: Medicaid Other | Source: Home / Self Care

## 2015-01-30 ENCOUNTER — Encounter (HOSPITAL_COMMUNITY): Payer: Self-pay

## 2015-01-30 DIAGNOSIS — M7062 Trochanteric bursitis, left hip: Secondary | ICD-10-CM | POA: Diagnosis not present

## 2015-01-30 MED ORDER — MELOXICAM 15 MG PO TABS: 7.5000 mg | ORAL_TABLET | Freq: Every day | ORAL | Status: DC

## 2015-01-30 MED ORDER — LIDOCAINE HCL 2 % IJ SOLN
INTRAMUSCULAR | Status: AC
  Filled 2015-01-30: qty 20

## 2015-01-30 MED ORDER — METHYLPREDNISOLONE ACETATE 40 MG/ML IJ SUSP
40.0000 mg | Freq: Once | INTRAMUSCULAR | Status: AC
  Administered 2015-01-30: 40 mg via INTRA_ARTICULAR

## 2015-01-30 MED ORDER — METHYLPREDNISOLONE ACETATE 40 MG/ML IJ SUSP
INTRAMUSCULAR | Status: AC
  Filled 2015-01-30: qty 1

## 2015-01-30 NOTE — ED Notes (Signed)
C/o pain left leg x past few days . Started new Rx muscle relaxer yesterday,no relief

## 2015-01-30 NOTE — Discharge Instructions (Signed)
Your leg pain is likely from a condition called greater trochanteric bursitis. This was treated today with a steroid injection which did help significantly. He may noticed a small increasing ear pain tonight but this should start to improve within 24-48 hours. Please start the exercises as outlined below. Please apply warm compresses or heating pad as well as some gentle massage to the area. Please call your primary care physician or a sports medicine physician to follow-up with if needed.   Trochanteric Bursitis You have hip pain due to trochanteric bursitis. Bursitis means that the sack near the outside of the hip is filled with fluid and inflamed. This sack is made up of protective soft tissue. The pain from trochanteric bursitis can be severe and keep you from sleep. It can radiate to the buttocks or down the outside of the thigh to the knee. The pain is almost always worse when rising from the seated or lying position and with walking. Pain can improve after you take a few steps. It happens more often in people with hip joint and lumbar spine problems, such as arthritis or previous surgery. Very rarely the trochanteric bursa can become infected, and antibiotics and/or surgery may be needed. Treatment often includes an injection of local anesthetic mixed with cortisone medicine. This medicine is injected into the area where it is most tender over the hip. Repeat injections may be necessary if the response to treatment is slow. You can apply ice packs over the tender area for 30 minutes every 2 hours for the next few days. Anti-inflammatory and/or narcotic pain medicine may also be helpful. Limit your activity for the next few days if the pain continues. See your caregiver in 5-10 days if you are not greatly improved.  SEEK IMMEDIATE MEDICAL CARE IF:  You develop severe pain, fever, or increased redness.  You have pain that radiates below the knee. EXERCISES STRETCHING EXERCISES - Trochanteric  Bursitis  These exercises may help you when beginning to rehabilitate your injury. Your symptoms may resolve with or without further involvement from your physician, physical therapist, or athletic trainer. While completing these exercises, remember:   Restoring tissue flexibility helps normal motion to return to the joints. This allows healthier, less painful movement and activity.  An effective stretch should be held for at least 30 seconds.  A stretch should never be painful. You should only feel a gentle lengthening or release in the stretched tissue. STRETCH - Iliotibial Band  On the floor or bed, lie on your side so your injured leg is on top. Bend your knee and grab your ankle.  Slowly bring your knee back so that your thigh is in line with your trunk. Keep your heel at your buttocks and gently arch your back so your head, shoulders and hips line up.  Slowly lower your leg so that your knee approaches the floor/bed until you feel a gentle stretch on the outside of your thigh. If you do not feel a stretch and your knee will not fall farther, place the heel of your opposite foot on top of your knee and pull your thigh down farther.  Hold this stretch for __________ seconds.  Repeat __________ times. Complete this exercise __________ times per day. STRETCH - Hamstrings, Supine   Lie on your back. Loop a belt or towel over the ball of your foot as shown.  Straighten your knee and slowly pull on the belt to raise your injured leg. Do not allow the knee to bend.  Keep your opposite leg flat on the floor.  Raise the leg until you feel a gentle stretch behind your knee or thigh. Hold this position for __________ seconds.  Repeat __________ times. Complete this stretch __________ times per day. STRETCH - Quadriceps, Prone   Lie on your stomach on a firm surface, such as a bed or padded floor.  Bend your knee and grasp your ankle. If you are unable to reach your ankle or pant leg, use a  belt around your foot to lengthen your reach.  Gently pull your heel toward your buttocks. Your knee should not slide out to the side. You should feel a stretch in the front of your thigh and/or knee.  Hold this position for __________ seconds.  Repeat __________ times. Complete this stretch __________ times per day. STRETCHING - Hip Flexors, Lunge Half kneel with your knee on the floor and your opposite knee bent and directly over your ankle.  Keep good posture with your head over your shoulders. Tighten your buttocks to point your tailbone downward; this will prevent your back from arching too much.  You should feel a gentle stretch in the front of your thigh and/or hip. If you do not feel any resistance, slightly slide your opposite foot forward and then slowly lunge forward so your knee once again lines up over your ankle. Be sure your tailbone remains pointed downward.  Hold this stretch for __________ seconds.  Repeat __________ times. Complete this stretch __________ times per day. STRETCH - Adductors, Lunge  While standing, spread your legs.  Lean away from your injured leg by bending your opposite knee. You may rest your hands on your thigh for balance.  You should feel a stretch in your inner thigh. Hold for __________ seconds.  Repeat __________ times. Complete this exercise __________ times per day. Document Released: 11/05/2004 Document Revised: 02/12/2014 Document Reviewed: 01/10/2009 [facility] Patient Information 2015 [place], [place]. This information is not intended to replace advice given to you by your health care provider. Make sure you discuss any questions you have with your health care provider.

## 2015-01-30 NOTE — ED Provider Notes (Signed)
CSN: [id number]     Arrival date & time 01/30/15  1044 History   None    Chief Complaint  Patient presents with  . Leg Pain   (Consider location/radiation/quality/duration/timing/severity/associated sxs/prior Treatment) HPI  L leg pain: started 3 days ago. Getting worse. Difficulty sleeping due to pain. Worse w/ movemement. L proximal lateral leg. infrequent walking for exercise. No trauma to the area. No radiation. Stretching w/o improvement. Pain seems to always be there and a low aching nature but with intermittent waxing and waning nature.  Diabetes: Blood glucose typically in the mid 100 range.  Denies abdominal pain, fevers, nausea, vomiting, constipation, diarrhea, dysuria, frequency, chest pain, shortness of breath. Patient with history of significant spinal pathology including disc herniations and rupture. Patient follows up with orthopedic surgeon regularly for this but is not a surgical candidate at this time. Patient denies lumbar pain with radiation down the buttocks and leg on either side at this current point time.   Past Medical History  Diagnosis Date  . Hyperlipidemia   . Hypertension     under control, has been on med. > 1 yr.  . Diabetes mellitus     IDDM  . 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 Arthritis     right knee  . Wears dentures     upper  . Wears partial dentures     lower  . 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 Disc degeneration, lumbar   . Anemia   . Fibromyalgia   . Vertigo 2015  . Anxiety   . Jerky body movements     while awake and asleep   Past Surgical History  Procedure Laterality Date  . No past surgeries    . Breast lumpectomy with needle localization  08/15/2012    Procedure: BREAST LUMPECTOMY WITH NEEDLE LOCALIZATION;  Surgeon: Haywood Lasso, MD;  Location: [place];  Service: General;  Laterality: Right;  Wire localizations Right breast calcifications  . Breast surgery    .  Refractive surgery Right     micro aneuysms  . Dilitation & currettage/hystroscopy with thermachoice ablation N/A 08/21/2014    Procedure: DILATATION & CURETTAGE/HYSTEROSCOPY WITH THERMACHOICE ABLATION;  Surgeon: Jonnie Kind, MD;  Location: [facility];  Service: Gynecology;  Laterality: N/A;  . Polypectomy N/A 08/21/2014    Procedure: ENDOMETRIAL POLYPECTOMY;  Surgeon: Jonnie Kind, MD;  Location: [facility];  Service: Gynecology;  Laterality: N/A;   Family History  Problem Relation Age of Onset  . Breast cancer Paternal Grandmother 37    breast; dbl mastectomy  . Diabetes Father   . Heart disease Father   . Hypertension Father   . Heart attack Father   . Diabetes Mother   . Heart disease Mother   . Hypertension Mother   . Diabetes Sister   . Fibromyalgia Sister   . Diabetes Sister   . Heart attack Maternal Grandmother     <35  . Heart attack Maternal Grandfather   . Heart attack Paternal Grandfather   . Diabetes Brother    History  Substance Use Topics  . Smoking status: Never Smoker   . Smokeless tobacco: Never Used  . Alcohol Use: No   OB History    Gravida Para Term Preterm AB TAB SAB Ectopic Multiple Living   0              Review of Systems Per HPI with all other pertinent systems negative.  Allergies  Other and Penicillins  Home Medications   Prior to Admission medications   Medication Sig Start Date End Date Taking? Authorizing Provider  acetaminophen (TYLENOL) 500 MG tablet Take 1,000 mg by mouth every 6 (six) hours as needed for mild pain.    Historical Provider, MD  amitriptyline (ELAVIL) 75 MG tablet Take 75 mg by mouth at bedtime.    Historical Provider, MD  cyclobenzaprine (FLEXERIL) 10 MG tablet Take 10 mg by mouth at bedtime.    Historical Provider, MD  DULoxetine (CYMBALTA) 60 MG capsule Take 1 capsule (60 mg total) by mouth daily. 12/14/13   Marcial Pacas, MD  fish oil-omega-3 fatty acids 1000 MG capsule Take 1 g by mouth 2 (two) times daily.      Historical Provider, MD  glipiZIDE (GLUCOTROL) 5 MG tablet Take by mouth daily before breakfast.    Historical Provider, MD  insulin aspart (NOVOLOG) 100 UNIT/ML injection Inject 1-4 Units into the skin 3 (three) times daily before meals. Sliding scale CBG: 101-150 - 1 unit, 151-201 2 units, 202-250 3 untis, 251- 301 4 units    Historical Provider, MD  insulin glargine (LANTUS) 100 UNIT/ML injection Inject 32 Units into the skin at bedtime.     Historical Provider, MD  IRON, FERROUS GLUCONATE, PO Take 65 mg by mouth daily.    Historical Provider, MD  lisinopril-hydrochlorothiazide (PRINZIDE,ZESTORETIC) 20-25 MG per tablet Take 1 tablet by mouth 2 (two) times daily.    Historical Provider, MD  Magnesium 250 MG TABS Take 1 tablet by mouth 2 (two) times daily.     Historical Provider, MD  MECLIZINE HCL PO Take 1 tablet by mouth daily.    Historical Provider, MD  meloxicam (MOBIC) 15 MG tablet Take 0.5-1 tablets (7.5-15 mg total) by mouth daily. 01/30/15   Waldemar Dickens, MD  metFORMIN (GLUCOPHAGE) 1000 MG tablet Take 1,000 mg by mouth 2 times daily at 12 noon and 4 pm.    Historical Provider, MD  oxyCODONE-acetaminophen (PERCOCET/ROXICET) 5-325 MG per tablet Take 1 tablet by mouth every 4 (four) hours as needed. 08/21/14   Jonnie Kind, MD  rosuvastatin (CRESTOR) 40 MG tablet Take 40 mg by mouth daily.    Historical Provider, MD  tamoxifen (NOLVADEX) 20 MG tablet Take 1 tablet (20 mg total) by mouth daily. 11/21/14   Nicholas Lose, MD  traMADol (ULTRAM) 50 MG tablet Take 50 mg by mouth every 6 (six) hours as needed for moderate pain.     Historical Provider, MD   BP 137/92 mmHg  Pulse 90  Temp(Src) 99.1 F (37.3 C) (Oral)  Resp 16  SpO2 99% Physical Exam Physical Exam  Constitutional: oriented to person, place, and time. appears well-developed and well-nourished. No distress.  HENT:  Head: Normocephalic and atraumatic.  Eyes: EOMI. PERRL.  Neck: Normal range of motion.  Cardiovascular: RRR,  no m/r/g, 2+ distal pulses,  Pulmonary/Chest: Effort normal and breath sounds normal. No respiratory distress.  Abdominal: Soft. Bowel sounds are normal. NonTTP, no distension.  Musculoskeletal: Normal range of motion. Non ttp, no effusion. Point tenderness over the left greater trochanteric bursa.  Neurological: alert and oriented to person, place, and time.  Skin: Skin is warm. No rash noted. non diaphoretic.  Psychiatric: normal mood and affect. behavior is normal. Judgment and thought content normal.   ED Course  Procedures (including critical care time) Labs Review Labs Reviewed - No data to display  Imaging Review No results found.  A steroid injection was  performed at left greater trochanteric bursa using 1% plain Lidocaine and *40 mg of Depo-Medrol. This was well tolerated.    MDM   1. Greater trochanteric bursitis, left    Steroid injection as above. Precautions given and all questions answered. Patient also provided with limited course of meloxicam. Patient to monitor glucose levels very closely as patient is also diabetic. Patient follows with primary care physician or orthopedic surgeon if this does not improve symptoms.    Waldemar Dickens, MD 01/30/15 [phone]

## 2015-03-14 ENCOUNTER — Encounter: Payer: Self-pay | Admitting: Podiatry

## 2015-03-14 ENCOUNTER — Ambulatory Visit (INDEPENDENT_AMBULATORY_CARE_PROVIDER_SITE_OTHER): Payer: Medicaid Other | Admitting: Podiatry

## 2015-03-14 VITALS — BP 109/72 | HR 98 | Temp 99.0°F | Resp 16

## 2015-03-14 DIAGNOSIS — M2042 Other hammer toe(s) (acquired), left foot: Secondary | ICD-10-CM | POA: Diagnosis not present

## 2015-03-14 DIAGNOSIS — B351 Tinea unguium: Secondary | ICD-10-CM

## 2015-03-14 DIAGNOSIS — M2041 Other hammer toe(s) (acquired), right foot: Secondary | ICD-10-CM

## 2015-03-14 DIAGNOSIS — E1140 Type 2 diabetes mellitus with diabetic neuropathy, unspecified: Secondary | ICD-10-CM

## 2015-03-14 DIAGNOSIS — M79672 Pain in left foot: Secondary | ICD-10-CM | POA: Diagnosis not present

## 2015-03-14 NOTE — Progress Notes (Signed)
   Subjective:    Patient ID: Alicia Coffey, female    DOB: [birth year], 44 y.o.   MRN: [id number]  HPI This patient presents with painful toe on her third and fourth toes both feet.  She says she experiences pain walking and wearing certain shoes.  She states these toes are curving under her other toes .  She is diabetic and is concerned about this problem.  She also has thick painful nails wearing her shoes.   She is diabetic with neuropathy. She presents for evaluation and treatment.    Review of Systems  Constitutional: Positive for activity change, appetite change and fatigue.       Sweating  HENT: Positive for sinus pressure.   Respiratory: Positive for shortness of breath.   Cardiovascular:       Calf pain with walking  Musculoskeletal: Positive for back pain and gait problem.       Muscle pain, joint pain  Allergic/Immunologic: Positive for environmental allergies.  Neurological: Positive for dizziness, tremors, weakness, light-headedness and numbness.       Objective:   Physical Exam Rigid hammer toe deformities third and fourth toe both feet.  Dorsalis pedis and posterior tibial pulses are palpable.  CR and TG WNL>  Semmes-Weinstein test markedly diminished.  Rigid hammer toes 3,4  B/L       Assessment & Plan:  Initial Exam  2. Debridement of onychomycosis  3.  Hammer toes 3,4 B/L.   Plan   Debridement of nails 1-5 B/L   Discussed conservative vs. Surgical treatment of hammer toes.  RTC 3 months

## 2015-05-07 ENCOUNTER — Telehealth: Payer: Self-pay | Admitting: *Deleted

## 2015-05-07 NOTE — Telephone Encounter (Signed)
Please have her make an appt to discuss. options

## 2015-05-07 NOTE — Telephone Encounter (Signed)
PT. HAS BEEN ON TAMOXIFEN SINCE 2014. TWO TO THREE MONTHS AGO SHE BEGAN HAVING "HEAT WAVES". PT. IS SWEATING PROFUSELY ANY TIME OF THE DAY AND AT ANY PLACE. ALSO SOMETIMES HER HEART STARTS BEATING VERY FAST. PT.'S PRIMARY CARE PHYSICIAN IS MAKING PT. AN APPOINTMENT WITH A CARDIOLOGIST. HER PRIMARY CARE PHYSICIAN ALSO WANTED PT. TO CONTACT DR.GUDENA CONCERNING THESE EPISODES.

## 2015-06-18 ENCOUNTER — Ambulatory Visit (INDEPENDENT_AMBULATORY_CARE_PROVIDER_SITE_OTHER): Payer: Medicaid Other | Admitting: Podiatry

## 2015-06-18 ENCOUNTER — Encounter: Payer: Self-pay | Admitting: Podiatry

## 2015-06-18 DIAGNOSIS — M79673 Pain in unspecified foot: Secondary | ICD-10-CM | POA: Diagnosis not present

## 2015-06-18 DIAGNOSIS — B351 Tinea unguium: Secondary | ICD-10-CM

## 2015-06-18 DIAGNOSIS — M79609 Pain in unspecified limb: Principal | ICD-10-CM

## 2015-06-18 NOTE — Progress Notes (Signed)
Patient ID: Alicia Coffey, female   DOB: 06-21-1971, 44 y.o.   MRN: [id number] Complaint:  Visit Type: Patient returns to my office for continued preventative foot care services. Complaint: Patient states" my nails have grown long and thick and become painful to walk and wear shoes" Patient has been diagnosed with DM with neuropathy. The patient presents for preventative foot care services. No changes to ROS  Podiatric Exam: Vascular: dorsalis pedis and posterior tibial pulses are palpable bilateral. Capillary return is immediate. Temperature gradient is WNL. Skin turgor WNL  Sensorium: Diminished  Semmes Weinstein monofilament test. Normal tactile sensation bilaterally. Nail Exam: Pt has thick disfigured discolored nails with subungual debris noted bilateral entire nail hallux through fifth toenails Ulcer Exam: There is no evidence of ulcer or pre-ulcerative changes or infection. Orthopedic Exam: Muscle tone and strength are WNL. No limitations in general ROM. No crepitus or effusions noted. Foot type and digits show no abnormalities. Bony prominences are unremarkable. Rigid hammer toes  3,4 B/L Skin: No Porokeratosis. No infection or ulcers  Diagnosis:  Onychomycosis, , Pain in right toe, pain in left toes  Treatment & Plan Procedures and Treatment: Consent by patient was obtained for treatment procedures. The patient understood the discussion of treatment and procedures well. All questions were answered thoroughly reviewed. Debridement of mycotic and hypertrophic toenails, 1 through 5 bilateral and clearing of subungual debris. No ulceration, no infection noted.  Return Visit-Office Procedure: Patient instructed to return to the office for a follow up visit 3 months for continued evaluation and treatment.

## 2015-06-19 DIAGNOSIS — Z029 Encounter for administrative examinations, unspecified: Secondary | ICD-10-CM

## 2015-06-21 ENCOUNTER — Other Ambulatory Visit: Payer: Self-pay | Admitting: Orthopedic Surgery

## 2015-07-15 ENCOUNTER — Encounter (HOSPITAL_BASED_OUTPATIENT_CLINIC_OR_DEPARTMENT_OTHER)
Admission: RE | Admit: 2015-07-15 | Discharge: 2015-07-15 | Disposition: A | Discharge status: Home / Self Care | Payer: Medicaid Other | Source: Ambulatory Visit | Attending: Orthopedic Surgery | Admitting: Orthopedic Surgery

## 2015-07-15 DIAGNOSIS — M797 Fibromyalgia: Secondary | ICD-10-CM | POA: Diagnosis not present

## 2015-07-15 DIAGNOSIS — Z794 Long term (current) use of insulin: Secondary | ICD-10-CM | POA: Diagnosis not present

## 2015-07-15 DIAGNOSIS — E1140 Type 2 diabetes mellitus with diabetic neuropathy, unspecified: Secondary | ICD-10-CM | POA: Diagnosis not present

## 2015-07-15 DIAGNOSIS — E785 Hyperlipidemia, unspecified: Secondary | ICD-10-CM | POA: Diagnosis not present

## 2015-07-15 DIAGNOSIS — M170 Bilateral primary osteoarthritis of knee: Secondary | ICD-10-CM | POA: Diagnosis not present

## 2015-07-15 DIAGNOSIS — Z880 Allergy status to penicillin: Secondary | ICD-10-CM | POA: Diagnosis not present

## 2015-07-15 DIAGNOSIS — M94261 Chondromalacia, right knee: Secondary | ICD-10-CM | POA: Diagnosis not present

## 2015-07-15 DIAGNOSIS — F419 Anxiety disorder, unspecified: Secondary | ICD-10-CM | POA: Diagnosis not present

## 2015-07-15 DIAGNOSIS — I10 Essential (primary) hypertension: Secondary | ICD-10-CM | POA: Diagnosis not present

## 2015-07-15 DIAGNOSIS — Z853 Personal history of malignant neoplasm of breast: Secondary | ICD-10-CM | POA: Diagnosis not present

## 2015-07-15 DIAGNOSIS — M23321 Other meniscus derangements, posterior horn of medial meniscus, right knee: Secondary | ICD-10-CM | POA: Diagnosis not present

## 2015-07-15 LAB — BASIC METABOLIC PANEL
ANION GAP: 10 (ref 5–15)
BUN: 23 mg/dL — ABNORMAL HIGH (ref 6–20)
CALCIUM: 8.9 mg/dL (ref 8.9–10.3)
CO2: 27 mmol/L (ref 22–32)
Chloride: 102 mmol/L (ref 101–111)
Creatinine, Ser: 1.21 mg/dL — ABNORMAL HIGH (ref 0.44–1.00)
GFR, EST NON AFRICAN AMERICAN: 53 mL/min — AB (ref >60)
Glucose, Bld: 115 mg/dL — ABNORMAL HIGH (ref 65–99)
Potassium: 4.3 mmol/L (ref 3.5–5.1)
Sodium: 139 mmol/L (ref 135–145)

## 2015-07-15 NOTE — Progress Notes (Signed)
Pre op EKG done 07/15/2015  Reviewed by anesthesiologist Dr. Smith Robert. Ok to proceed with sx 07/17/2015

## 2015-07-16 NOTE — H&P (Signed)
Alicia Coffey is an 44 y.o. female.    Chief Complaint: Right Knee Pain and Buckling  HPI: Patient is here today for follow up on bilateral knee pain.  She was last seen on 06/03/15 by Dr. Rip Harbour.  At that time she was referred for bilateral MRIs.  She is here today to discuss the results.  She denies any new injury.  She states that the right knee is worse than the left and will buckle at times.  Patient is on disability due to her back.  Patient also has a history of arthritis bilateral knees as well as type 2 diabetes, peripheral neuropathy and renal insufficiency.  Past Medical History  Diagnosis Date  . Hyperlipidemia   . Hypertension     under control, has been on med. > 1 yr.  . Diabetes mellitus     IDDM  . 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 Arthritis     right knee  . Wears dentures     upper  . Wears partial dentures     lower  . 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 Disc degeneration, lumbar   . Anemia   . Fibromyalgia   . Vertigo 2015  . Anxiety   . Jerky body movements     while awake and asleep    Past Surgical History  Procedure Laterality Date  . No past surgeries    . Breast lumpectomy with needle localization  08/15/2012    Procedure: BREAST LUMPECTOMY WITH NEEDLE LOCALIZATION;  Surgeon: Haywood Lasso, MD;  Location: [place];  Service: General;  Laterality: Right;  Wire localizations Right breast calcifications  . Breast surgery    . Refractive surgery Right     micro aneuysms  . Dilitation & currettage/hystroscopy with thermachoice ablation N/A 08/21/2014    Procedure: DILATATION & CURETTAGE/HYSTEROSCOPY WITH THERMACHOICE ABLATION;  Surgeon: Jonnie Kind, MD;  Location: [facility];  Service: Gynecology;  Laterality: N/A;  . Polypectomy N/A 08/21/2014    Procedure: ENDOMETRIAL POLYPECTOMY;  Surgeon: Jonnie Kind, MD;  Location: [facility];  Service: Gynecology;  Laterality: N/A;    Family History  Problem  Relation Age of Onset  . Breast cancer Paternal Grandmother 67    breast; dbl mastectomy  . Diabetes Father   . Heart disease Father   . Hypertension Father   . Heart attack Father   . Diabetes Mother   . Heart disease Mother   . Hypertension Mother   . Diabetes Sister   . Fibromyalgia Sister   . Diabetes Sister   . Heart attack Maternal Grandmother     <35  . Heart attack Maternal Grandfather   . Heart attack Paternal Grandfather   . Diabetes Brother    Social History:  reports that she has never smoked. She has never used smokeless tobacco. She reports that she does not drink alcohol or use illicit drugs.  Allergies:  Allergies  Allergen Reactions  . Other Itching    Powered gloves  . Penicillins   . Seasonal Ic [Cholestatin]     Sneezing, runny nose    No prescriptions prior to admission    Results for orders placed or performed during the hospital encounter of 07/17/15 (from the past 48 hour(s))  Basic metabolic panel     Status: Abnormal   Collection Time: 07/15/15 12:10 PM  Result Value Ref Range   Sodium 139 135 - 145 mmol/L   Potassium 4.3  3.5 - 5.1 mmol/L   Chloride 102 101 - 111 mmol/L   CO2 27 22 - 32 mmol/L   Glucose, Bld 115 (H) 65 - 99 mg/dL   BUN 23 (H) 6 - 20 mg/dL   Creatinine, Ser 1.21 (H) 0.44 - 1.00 mg/dL   Calcium 8.9 8.9 - 10.3 mg/dL   GFR calc non Af Amer 53 (L) >60 mL/min   GFR calc Af Amer >60 >60 mL/min    Comment: (NOTE) The eGFR has been calculated using the CKD EPI equation. This calculation has not been validated in all clinical situations. eGFR's persistently <60 mL/min signify possible Chronic Kidney Disease.    Anion gap 10 5 - 15   No results found.  Review of Systems  Constitutional: Positive for malaise/fatigue and diaphoresis.  Eyes: Positive for blurred vision.  Cardiovascular: Positive for palpitations and leg swelling.       HTN  Gastrointestinal: Positive for constipation.  Musculoskeletal: Positive for myalgias  and joint pain.  Neurological: Positive for dizziness and headaches.  Endo/Heme/Allergies: Bruises/bleeds easily.       Blood sugar problem  Psychiatric/Behavioral: Positive for depression.    Height 5' 3"  (1.6 m), weight 112.038 kg (247 lb), last menstrual period 07/02/2015. Physical Exam  Constitutional: She is oriented to person, place, and time. She appears well-developed and well-nourished.  HENT:  Head: Normocephalic and atraumatic.  Eyes: Pupils are equal, round, and reactive to light.  Neck: Normal range of motion. Neck supple.  Cardiovascular: Intact distal pulses.   Respiratory: Effort normal.  Musculoskeletal: She exhibits tenderness.  the patient has tenderness over the medial joint line bilaterally.  McMurray's test does cause obvious pain.  She has no noticeable effusion bilaterally.  She does have crepitance bilaterally.  Her calves are soft and nontender.  She is neurovascularly intact distally.  Neurological: She is alert and oriented to person, place, and time.  Skin: Skin is warm and dry.  Psychiatric: She has a normal mood and affect. Her behavior is normal. Judgment and thought content normal.     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                 Medial meniscus tear right knee                 Medial meniscus tear left knee  Plan: Treatment options are discussed with the patient.  Today, she does wish to consider surgical intervention first on the right knee.  She states this is the knee that buckles on her.  The benefits risks and potential complications of surgery are discussed with the patient.  A posting slip is completed and she is to discuss timing with Sandi Raveling our surgery scheduler.  We anticipate seeing the patient back at time of surgical intervention.  Call with any issues.  No medications asked for or given today.  Kendrik Mcshan R 07/16/2015, 12:32 PM

## 2015-07-17 ENCOUNTER — Ambulatory Visit (HOSPITAL_BASED_OUTPATIENT_CLINIC_OR_DEPARTMENT_OTHER): Payer: Medicaid Other | Admitting: Anesthesiology

## 2015-07-17 ENCOUNTER — Ambulatory Visit (HOSPITAL_BASED_OUTPATIENT_CLINIC_OR_DEPARTMENT_OTHER)
Admission: RE | Admit: 2015-07-17 | Discharge: 2015-07-17 | Disposition: A | Discharge status: Home / Self Care | Payer: Medicaid Other | Source: Ambulatory Visit | Attending: Orthopedic Surgery | Admitting: Orthopedic Surgery

## 2015-07-17 ENCOUNTER — Encounter (HOSPITAL_BASED_OUTPATIENT_CLINIC_OR_DEPARTMENT_OTHER)
Admission: RE | Disposition: A | Discharge status: Home / Self Care | Payer: Self-pay | Source: Ambulatory Visit | Attending: Orthopedic Surgery

## 2015-07-17 ENCOUNTER — Encounter (HOSPITAL_BASED_OUTPATIENT_CLINIC_OR_DEPARTMENT_OTHER): Payer: Self-pay | Admitting: *Deleted

## 2015-07-17 DIAGNOSIS — E1140 Type 2 diabetes mellitus with diabetic neuropathy, unspecified: Secondary | ICD-10-CM | POA: Insufficient documentation

## 2015-07-17 DIAGNOSIS — M170 Bilateral primary osteoarthritis of knee: Secondary | ICD-10-CM | POA: Insufficient documentation

## 2015-07-17 DIAGNOSIS — E785 Hyperlipidemia, unspecified: Secondary | ICD-10-CM | POA: Diagnosis not present

## 2015-07-17 DIAGNOSIS — Z880 Allergy status to penicillin: Secondary | ICD-10-CM | POA: Insufficient documentation

## 2015-07-17 DIAGNOSIS — Z853 Personal history of malignant neoplasm of breast: Secondary | ICD-10-CM | POA: Insufficient documentation

## 2015-07-17 DIAGNOSIS — Z794 Long term (current) use of insulin: Secondary | ICD-10-CM | POA: Insufficient documentation

## 2015-07-17 DIAGNOSIS — M23321 Other meniscus derangements, posterior horn of medial meniscus, right knee: Secondary | ICD-10-CM | POA: Diagnosis not present

## 2015-07-17 DIAGNOSIS — F419 Anxiety disorder, unspecified: Secondary | ICD-10-CM | POA: Insufficient documentation

## 2015-07-17 DIAGNOSIS — I10 Essential (primary) hypertension: Secondary | ICD-10-CM | POA: Insufficient documentation

## 2015-07-17 DIAGNOSIS — M94261 Chondromalacia, right knee: Secondary | ICD-10-CM | POA: Insufficient documentation

## 2015-07-17 DIAGNOSIS — M797 Fibromyalgia: Secondary | ICD-10-CM | POA: Insufficient documentation

## 2015-07-17 DIAGNOSIS — S83241A Other tear of medial meniscus, current injury, right knee, initial encounter: Secondary | ICD-10-CM

## 2015-07-17 HISTORY — PX: KNEE ARTHROSCOPY WITH MEDIAL MENISECTOMY: SHX5651

## 2015-07-17 LAB — GLUCOSE, CAPILLARY
GLUCOSE-CAPILLARY: 114 mg/dL — AB (ref 65–99)
Glucose-Capillary: 122 mg/dL — ABNORMAL HIGH (ref 65–99)

## 2015-07-17 SURGERY — ARTHROSCOPY, KNEE, WITH MEDIAL MENISCECTOMY
Anesthesia: General | Site: Knee | Laterality: Right

## 2015-07-17 MED ORDER — HYDROCODONE-ACETAMINOPHEN 5-325 MG PO TABS: 1.0000 | ORAL_TABLET | Freq: Four times a day (QID) | ORAL | Status: DC | PRN

## 2015-07-17 MED ORDER — EPINEPHRINE HCL 1 MG/ML IJ SOLN
INTRAMUSCULAR | Status: DC | PRN
  Administered 2015-07-17: 3100 mL

## 2015-07-17 MED ORDER — SCOPOLAMINE 1 MG/3DAYS TD PT72: 1.0000 | MEDICATED_PATCH | Freq: Once | TRANSDERMAL | Status: DC | PRN

## 2015-07-17 MED ORDER — FENTANYL CITRATE (PF) 100 MCG/2ML IJ SOLN
INTRAMUSCULAR | Status: AC
  Filled 2015-07-17: qty 4

## 2015-07-17 MED ORDER — EPINEPHRINE HCL 1 MG/ML IJ SOLN
INTRAMUSCULAR | Status: AC
  Filled 2015-07-17: qty 1

## 2015-07-17 MED ORDER — BUPIVACAINE-EPINEPHRINE (PF) 0.5% -1:200000 IJ SOLN
INTRAMUSCULAR | Status: AC
  Filled 2015-07-17: qty 30

## 2015-07-17 MED ORDER — GLYCOPYRROLATE 0.2 MG/ML IJ SOLN: 0.2000 mg | Freq: Once | INTRAMUSCULAR | Status: DC | PRN

## 2015-07-17 MED ORDER — MEPERIDINE HCL 25 MG/ML IJ SOLN: 6.2500 mg | INTRAMUSCULAR | Status: DC | PRN

## 2015-07-17 MED ORDER — HYDROMORPHONE HCL 1 MG/ML IJ SOLN: 0.2500 mg | INTRAMUSCULAR | Status: DC | PRN

## 2015-07-17 MED ORDER — ONDANSETRON HCL 4 MG/2ML IJ SOLN
INTRAMUSCULAR | Status: DC | PRN
  Administered 2015-07-17: 4 mg via INTRAVENOUS

## 2015-07-17 MED ORDER — MIDAZOLAM HCL 2 MG/2ML IJ SOLN: 0.5000 mg | Freq: Once | INTRAMUSCULAR | Status: DC | PRN

## 2015-07-17 MED ORDER — BUPIVACAINE-EPINEPHRINE 0.5% -1:200000 IJ SOLN
INTRAMUSCULAR | Status: DC | PRN
  Administered 2015-07-17: 20 mL

## 2015-07-17 MED ORDER — PROMETHAZINE HCL 25 MG/ML IJ SOLN: 6.2500 mg | INTRAMUSCULAR | Status: DC | PRN

## 2015-07-17 MED ORDER — PROPOFOL 10 MG/ML IV BOLUS
INTRAVENOUS | Status: DC | PRN
  Administered 2015-07-17: 200 mg via INTRAVENOUS

## 2015-07-17 MED ORDER — DEXAMETHASONE SODIUM PHOSPHATE 10 MG/ML IJ SOLN
INTRAMUSCULAR | Status: AC
Start: 2015-07-17 — End: 2015-07-17
  Filled 2015-07-17: qty 1

## 2015-07-17 MED ORDER — FENTANYL CITRATE (PF) 100 MCG/2ML IJ SOLN
INTRAMUSCULAR | Status: DC | PRN
  Administered 2015-07-17: 100 ug via INTRAVENOUS

## 2015-07-17 MED ORDER — CHLORHEXIDINE GLUCONATE 4 % EX LIQD: 60.0000 mL | Freq: Once | CUTANEOUS | Status: DC

## 2015-07-17 MED ORDER — LIDOCAINE HCL (CARDIAC) 20 MG/ML IV SOLN
INTRAVENOUS | Status: AC
  Filled 2015-07-17: qty 5

## 2015-07-17 MED ORDER — ONDANSETRON HCL 4 MG/2ML IJ SOLN
INTRAMUSCULAR | Status: AC
  Filled 2015-07-17: qty 2

## 2015-07-17 MED ORDER — MIDAZOLAM HCL 2 MG/2ML IJ SOLN
INTRAMUSCULAR | Status: AC
  Filled 2015-07-17: qty 4

## 2015-07-17 MED ORDER — LACTATED RINGERS IV SOLN
INTRAVENOUS | Status: DC
  Administered 2015-07-17: 11:00:00 via INTRAVENOUS

## 2015-07-17 MED ORDER — VANCOMYCIN HCL IN DEXTROSE 1-5 GM/200ML-% IV SOLN
1000.0000 mg | INTRAVENOUS | Status: AC
  Administered 2015-07-17: 1000 mg via INTRAVENOUS

## 2015-07-17 MED ORDER — LIDOCAINE HCL (CARDIAC) 20 MG/ML IV SOLN
INTRAVENOUS | Status: DC | PRN
  Administered 2015-07-17: 50 mg via INTRAVENOUS

## 2015-07-17 MED ORDER — MIDAZOLAM HCL 5 MG/5ML IJ SOLN
INTRAMUSCULAR | Status: DC | PRN
  Administered 2015-07-17: 2 mg via INTRAVENOUS

## 2015-07-17 MED ORDER — VANCOMYCIN HCL IN DEXTROSE 1-5 GM/200ML-% IV SOLN
INTRAVENOUS | Status: AC
  Filled 2015-07-17: qty 200

## 2015-07-17 MED ORDER — DEXTROSE-NACL 5-0.45 % IV SOLN: INTRAVENOUS | Status: DC

## 2015-07-17 SURGICAL SUPPLY — 40 items
BANDAGE ELASTIC 6 VELCRO ST LF (GAUZE/BANDAGES/DRESSINGS) ×2 IMPLANT
BLADE 4.2CUDA (BLADE) IMPLANT
BLADE CUTTER GATOR 3.5 (BLADE) ×2 IMPLANT
BLADE GREAT WHITE 4.2 (BLADE) ×2 IMPLANT
BNDG COHESIVE 6X5 TAN STRL LF (GAUZE/BANDAGES/DRESSINGS) ×2 IMPLANT
DRAPE ARTHROSCOPY W/POUCH 114 (DRAPES) ×2 IMPLANT
DURAPREP 26ML APPLICATOR (WOUND CARE) ×2 IMPLANT
ELECT MENISCUS 165MM 90D (ELECTRODE) IMPLANT
ELECT REM PT RETURN 9FT ADLT (ELECTROSURGICAL)
ELECTRODE REM PT RTRN 9FT ADLT (ELECTROSURGICAL) IMPLANT
GAUZE SPONGE 4X4 12PLY STRL (GAUZE/BANDAGES/DRESSINGS) ×2 IMPLANT
GAUZE XEROFORM 1X8 LF (GAUZE/BANDAGES/DRESSINGS) ×2 IMPLANT
GLOVE BIO SURGEON STRL SZ 6.5 (GLOVE) ×2 IMPLANT
GLOVE BIO SURGEON STRL SZ7.5 (GLOVE) ×2 IMPLANT
GLOVE BIO SURGEON STRL SZ8.5 (GLOVE) ×2 IMPLANT
GLOVE BIOGEL PI IND STRL 8 (GLOVE) ×1 IMPLANT
GLOVE BIOGEL PI IND STRL 9 (GLOVE) ×1 IMPLANT
GLOVE BIOGEL PI INDICATOR 8 (GLOVE) ×1
GLOVE BIOGEL PI INDICATOR 9 (GLOVE) ×1
GLOVE SURG SS PI 7.0 STRL IVOR (GLOVE) ×2 IMPLANT
GOWN STRL REUS W/ TWL LRG LVL3 (GOWN DISPOSABLE) ×2 IMPLANT
GOWN STRL REUS W/TWL LRG LVL3 (GOWN DISPOSABLE) ×2
GOWN STRL REUS W/TWL XL LVL3 (GOWN DISPOSABLE) ×2 IMPLANT
IV NS IRRIG 3000ML ARTHROMATIC (IV SOLUTION) ×2 IMPLANT
KNEE WRAP E Z 3 GEL PACK (MISCELLANEOUS) ×2 IMPLANT
MANIFOLD NEPTUNE II (INSTRUMENTS) ×2 IMPLANT
NDL SAFETY ECLIPSE 18X1.5 (NEEDLE) ×1 IMPLANT
NEEDLE HYPO 18GX1.5 SHARP (NEEDLE) ×1
PACK ARTHROSCOPY DSU (CUSTOM PROCEDURE TRAY) ×2 IMPLANT
PACK BASIN DAY SURGERY FS (CUSTOM PROCEDURE TRAY) ×2 IMPLANT
PAD ALCOHOL SWAB (MISCELLANEOUS) ×2 IMPLANT
PENCIL BUTTON HOLSTER BLD 10FT (ELECTRODE) IMPLANT
SET ARTHROSCOPY TUBING (MISCELLANEOUS) ×1
SET ARTHROSCOPY TUBING LN (MISCELLANEOUS) ×1 IMPLANT
SLEEVE SCD COMPRESS KNEE MED (MISCELLANEOUS) IMPLANT
SYR 3ML 18GX1 1/2 (SYRINGE) IMPLANT
SYR 5ML LL (SYRINGE) ×2 IMPLANT
TOWEL OR 17X24 6PK STRL BLUE (TOWEL DISPOSABLE) ×2 IMPLANT
WAND STAR VAC 90 (SURGICAL WAND) IMPLANT
WATER STERILE IRR 1000ML POUR (IV SOLUTION) ×2 IMPLANT

## 2015-07-17 NOTE — Anesthesia Preprocedure Evaluation (Addendum)
Anesthesia Evaluation  Patient identified by MRN, date of birth, ID band Patient awake    Reviewed: Allergy & Precautions, NPO status , Patient's Chart, lab work & pertinent test results  History of Anesthesia Complications Negative for: history of anesthetic complications  Airway Mallampati: II  TM Distance: >3 FB Neck ROM: Full    Dental  (+) Edentulous Upper, Edentulous Lower   Pulmonary neg pulmonary ROS,    breath sounds clear to auscultation       Cardiovascular hypertension, Pt. on medications (-) angina Rhythm:Regular Rate:Normal     Neuro/Psych Anxiety vertigo    GI/Hepatic negative GI ROS, Neg liver ROS,   Endo/Other  diabetes (glu 126), Insulin Dependent, Oral Hypoglycemic AgentsMorbid obesity  Renal/GU negative Renal ROS     Musculoskeletal  (+) Arthritis , Osteoarthritis,  Fibromyalgia -  Abdominal (+) + obese,   Peds  Hematology negative hematology ROS (+)   Anesthesia Other Findings   Reproductive/Obstetrics                           Anesthesia Physical Anesthesia Plan  ASA: III  Anesthesia Plan: General   Post-op Pain Management:    Induction: Intravenous  Airway Management Planned: LMA  Additional Equipment:   Intra-op Plan:   Post-operative Plan:   Informed Consent: I have reviewed the patients History and Physical, chart, labs and discussed the procedure including the risks, benefits and alternatives for the proposed anesthesia with the patient or authorized representative who has indicated his/her understanding and acceptance.     Plan Discussed with: CRNA and Surgeon  Anesthesia Plan Comments: (Plan routine monitors, GA-, LMA OK)        Anesthesia Quick Evaluation

## 2015-07-17 NOTE — Discharge Instructions (Addendum)
Knee Arthroscopy Knee arthroscopy is a surgical procedure that is used to examine the inside of your knee joint and repair any damage. The surgeon puts a small, lighted instrument with a camera on the tip (arthroscope) through a small incision in your knee. The camera sends pictures to a monitor in the operating room. Your surgeon uses those pictures to guide the surgical instruments through other incisions to the area of damage. Knee arthroscopy can be used to treat many types of knee problems. It may be used:  To repair a torn ligament.  To repair or remove damaged tissue.  To remove a fluid-filled sac (cyst) from your knee. LET [facility] CARE PROVIDER KNOW ABOUT:  Any allergies you have.  All medicines you are taking, including vitamins, herbs, eye drops, creams, and over-the-counter medicines.  Previous problems you or members of your family have had with the use of anesthetics.  Any blood disorders you have.  Previous surgeries you have had.  Any medical conditions you may have. RISKS AND COMPLICATIONS Generally, this is a safe procedure. However, problems may occur, including:  Infection.  Bleeding.  Damage to blood vessels, nerves, or structures of your knee.  A blood clot that forms in your leg and travels to your lung.  Failure to relieve symptoms. BEFORE THE PROCEDURE  Ask your health care provider about:  Changing or stopping your regular medicines. This is especially important if you are taking diabetes medicines or blood thinners.  Taking medicines such as aspirin and ibuprofen. These medicines can thin your blood. Do not take these medicines before your procedure if your health care provider instructs you not to.  Follow your health care provider's instructions about eating or drinking restrictions.  Plan to have someone take you home after the procedure.  If you go home right after the procedure, plan to have someone with you for 24 hours.  Do not  drink alcohol unless your health care provider says that you can.  Do not use any tobacco products, including cigarettes, chewing tobacco, or electronic cigarettes unless your health care provider says that you can. If you need help quitting, ask your health care provider.  You may have a physical exam. PROCEDURE  An IV tube will be inserted into one of your veins.  You will be given one or more of the following:  A medicine that helps you relax (sedative).  A medicine that numbs the area (local anesthetic).  A medicine that makes you fall asleep (general anesthetic).  A medicine that is injected into your spine that numbs the area below and slightly above the injection site (spinal anesthetic).  A medicine that is injected into an area of your body that numbs everything below the injection site (regional anesthetic).  A cuff may be placed around your upper leg to slow bleeding during the procedure.  The surgeon will make a small number of incisions around your knee.  Your knee joint will be flushed and filled with a germ-free (sterile) solution.  The arthroscope will be passed through an incision into your knee joint.  More instruments will be passed through other incisions to repair your knee as needed.  The fluid will be removed from your knee.  The incisions will be closed with adhesive strips or stitches (sutures).  A bandage (dressing) will be placed over your knee. The procedure may vary among health care providers and hospitals. AFTER THE PROCEDURE  Your blood pressure, heart rate, breathing rate and blood oxygen level  will be monitored often until the medicines you were given have worn off.  You may be given medicine for pain.  You may get crutches to help you walk without using your knee to support your body weight.  You may have to wear compression stockings. These stocking help to prevent blood clots and reduce swelling in your legs.   This information is  not intended to replace advice given to you by your health care provider. Make sure you discuss any questions you have with your health care provider.   Document Released: 09/25/2000 Document Revised: 02/12/2015 Document Reviewed: 09/24/2014 Elsevier Interactive Patient Education 2016 [place] Anesthesia Home Care Instructions  Activity: Get plenty of rest for the remainder of the day. A responsible adult should stay with you for 24 hours following the procedure.  For the next 24 hours, DO NOT: -Drive a car -Paediatric nurse -Drink alcoholic beverages -Take any medication unless instructed by your physician -Make any legal decisions or sign important papers.  Meals: Start with liquid foods such as gelatin or soup. Progress to regular foods as tolerated. Avoid greasy, spicy, heavy foods. If nausea and/or vomiting occur, drink only clear liquids until the nausea and/or vomiting subsides. Call your physician if vomiting continues.  Special Instructions/Symptoms: Your throat may feel dry or sore from the anesthesia or the breathing tube placed in your throat during surgery. If this causes discomfort, gargle with warm salt water. The discomfort should disappear within 24 hours.  If you had a scopolamine patch placed behind your ear for the management of post- operative nausea and/or vomiting:  1. The medication in the patch is effective for 72 hours, after which it should be removed.  Wrap patch in a tissue and discard in the trash. Wash hands thoroughly with soap and water. 2. You may remove the patch earlier than 72 hours if you experience unpleasant side effects which may include dry mouth, dizziness or visual disturbances. 3. Avoid touching the patch. Wash your hands with soap and water after contact with the patch.

## 2015-07-17 NOTE — Anesthesia Procedure Notes (Signed)
Procedure Name: LMA Insertion Date/Time: 07/17/2015 11:49 AM Performed by: Melynda Ripple D Pre-anesthesia Checklist: Patient identified, Emergency Drugs available, Suction available and Patient being monitored Patient Re-evaluated:Patient Re-evaluated prior to inductionOxygen Delivery Method: Circle System Utilized Preoxygenation: Pre-oxygenation with 100% oxygen Intubation Type: IV induction Ventilation: Mask ventilation without difficulty LMA: LMA inserted LMA Size: 4.0 Number of attempts: 1 Airway Equipment and Method: Bite block Placement Confirmation: positive ETCO2 Tube secured with: Tape Dental Injury: Teeth and Oropharynx as per pre-operative assessment

## 2015-07-17 NOTE — Anesthesia Postprocedure Evaluation (Signed)
  Anesthesia Post-op Note  Patient: Alicia Coffey  Procedure(s) Performed: Procedure(s): ARTHROSCOPY RIGHT KNEE Medial menisectomy chondroplasty of patella (Right)  Patient Location: PACU  Anesthesia Type:General  Level of Consciousness: awake, oriented and patient cooperative  Airway and Oxygen Therapy: Patient Spontanous Breathing  Post-op Pain: none  Post-op Assessment: Post-op Vital signs reviewed, Patient's Cardiovascular Status Stable, Respiratory Function Stable, Patent Airway, No signs of Nausea or vomiting and Pain level controlled LLE Motor Response: Purposeful movement LLE Sensation: Tingling          Post-op Vital Signs: Reviewed and stable  Last Vitals:  Filed Vitals:   07/17/15 1245  BP:   Pulse: 99  Temp:   Resp: 22    Complications: No apparent anesthesia complications

## 2015-07-17 NOTE — Op Note (Signed)
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 Mayer Camel M.D.  Assist: Kerry Hough. Barton Dubois  (present throughout entire procedure and necessary for timely completion of the procedure) Anes: General LMA  EBL: Minimal  Fluids: 800 cc   Indications: catching popping and pain in the right knee. Patient is at MRI scan showing posterior horn medial meniscal tear as well as chondromalacia.. Pt has failed conservative treatment with anti-inflammatory medicines, physical therapy, and modified activites but did get good temporarily from an intra-articular cortisone injection. Pain has recurred and patient desires elective arthroscopic evaluation and treatment of knee. Risks and benefits of surgery have been discussed and questions answered.  Procedure: Patient identified by arm band and taken to the operating room at the day surgery Center. The appropriate anesthetic monitors were attached, and General LMA anesthesia was induced without difficulty. Lateral post was applied to the table and the lower extremity was prepped and draped in usual sterile fashion from the ankle to the midthigh. Time out procedure was performed. We began the operation by making standard inferior lateral and inferior medial peripatellar portals with a #11 blade allowing introduction of the arthroscope through the inferior lateral portal and the out flow to the inferior medial portal. Pump pressure was set at 100 mmHg and diagnostic arthroscopy  revealed grade 3 chondral malacia flap tears to the apex of the patella debrided back to a stable margin of 3.5 mm Gator sucker shaver. Moving into the medial compartment we identified complex tearing of the posterior horn of the medial meniscus with a parrot-beak portion coming through the notch that was resected with a 35 Gator  sucker shaver. The posterior horn which was shredded was removed piecemeal with a small upbiter and a 35 Gator sucker shaver. There is also some grade 3 chondromalacia to the peripheral aspect of the medial tibial plateau lightly debrided as well as a focal area on the distal aspect of the medial femoral condyle about a centimeter in diameter. Anterior cruciate ligament and PCL are intact the lateral compartment was in excellent condition. Gutters were cleared medially and laterally. The trochlea had minimal chondromalacia. The knee was irrigated out normal saline solution. A dressing of xerofoam 4 x 4 dressing sponges, web roll and an Ace wrap was applied. The patient was awakened extubated and taken to the recovery without difficulty.    Signed: Kerin Salen, MD

## 2015-07-17 NOTE — Interval H&P Note (Signed)
History and Physical Interval Note:  07/17/2015 11:31 AM  Alicia Coffey  has presented today for surgery, with the diagnosis of right knee medial mensicus tear  The various methods of treatment have been discussed with the patient and family. After consideration of risks, benefits and other options for treatment, the patient has consented to  Procedure(s): ARTHROSCOPY RIGHT KNEE (Right) as a surgical intervention .  The patient's history has been reviewed, patient examined, no change in status, stable for surgery.  I have reviewed the patient's chart and labs.  Questions were answered to the patient's satisfaction.     Kerin Salen

## 2015-07-17 NOTE — Transfer of Care (Signed)
Immediate Anesthesia Transfer of Care Note  Patient: Alicia Coffey  Procedure(s) Performed: Procedure(s): ARTHROSCOPY RIGHT KNEE Medial menisectomy chondroplasty of patella (Right)  Patient Location: PACU  Anesthesia Type:General  Level of Consciousness: awake and alert   Airway & Oxygen Therapy: Patient Spontanous Breathing and Patient connected to face mask oxygen  Post-op Assessment: Report given to RN and Post -op Vital signs reviewed and stable  Post vital signs: Reviewed and stable  Last Vitals:  Filed Vitals:   07/17/15 1232  BP:   Pulse: 96  Temp:   Resp:     Complications: No apparent anesthesia complications

## 2015-07-18 ENCOUNTER — Encounter (HOSPITAL_BASED_OUTPATIENT_CLINIC_OR_DEPARTMENT_OTHER): Payer: Self-pay | Admitting: Orthopedic Surgery

## 2015-07-22 ENCOUNTER — Encounter (HOSPITAL_BASED_OUTPATIENT_CLINIC_OR_DEPARTMENT_OTHER): Payer: Self-pay | Admitting: Orthopedic Surgery

## 2015-08-11 ENCOUNTER — Encounter (HOSPITAL_COMMUNITY): Payer: Self-pay | Admitting: *Deleted

## 2015-08-11 ENCOUNTER — Emergency Department (HOSPITAL_COMMUNITY)
Admission: EM | Admit: 2015-08-11 | Discharge: 2015-08-12 | Disposition: A | Discharge status: Home / Self Care | Payer: Medicaid Other | Attending: Emergency Medicine | Admitting: Emergency Medicine

## 2015-08-11 DIAGNOSIS — F419 Anxiety disorder, unspecified: Secondary | ICD-10-CM | POA: Insufficient documentation

## 2015-08-11 DIAGNOSIS — E119 Type 2 diabetes mellitus without complications: Secondary | ICD-10-CM | POA: Diagnosis not present

## 2015-08-11 DIAGNOSIS — Z880 Allergy status to penicillin: Secondary | ICD-10-CM | POA: Diagnosis not present

## 2015-08-11 DIAGNOSIS — Z79899 Other long term (current) drug therapy: Secondary | ICD-10-CM | POA: Insufficient documentation

## 2015-08-11 DIAGNOSIS — S40262A Insect bite (nonvenomous) of left shoulder, initial encounter: Secondary | ICD-10-CM | POA: Diagnosis not present

## 2015-08-11 DIAGNOSIS — Y998 Other external cause status: Secondary | ICD-10-CM | POA: Diagnosis not present

## 2015-08-11 DIAGNOSIS — Z862 Personal history of diseases of the blood and blood-forming organs and certain disorders involving the immune mechanism: Secondary | ICD-10-CM | POA: Insufficient documentation

## 2015-08-11 DIAGNOSIS — M797 Fibromyalgia: Secondary | ICD-10-CM | POA: Diagnosis not present

## 2015-08-11 DIAGNOSIS — Z794 Long term (current) use of insulin: Secondary | ICD-10-CM | POA: Insufficient documentation

## 2015-08-11 DIAGNOSIS — Y9289 Other specified places as the place of occurrence of the external cause: Secondary | ICD-10-CM | POA: Diagnosis not present

## 2015-08-11 DIAGNOSIS — S50861A Insect bite (nonvenomous) of right forearm, initial encounter: Secondary | ICD-10-CM | POA: Insufficient documentation

## 2015-08-11 DIAGNOSIS — W57XXXA Bitten or stung by nonvenomous insect and other nonvenomous arthropods, initial encounter: Secondary | ICD-10-CM | POA: Diagnosis not present

## 2015-08-11 DIAGNOSIS — M1990 Unspecified osteoarthritis, unspecified site: Secondary | ICD-10-CM | POA: Insufficient documentation

## 2015-08-11 DIAGNOSIS — E785 Hyperlipidemia, unspecified: Secondary | ICD-10-CM | POA: Insufficient documentation

## 2015-08-11 DIAGNOSIS — Y9389 Activity, other specified: Secondary | ICD-10-CM | POA: Insufficient documentation

## 2015-08-11 DIAGNOSIS — I10 Essential (primary) hypertension: Secondary | ICD-10-CM | POA: Insufficient documentation

## 2015-08-11 NOTE — ED Notes (Signed)
Pt is here with itchy raised areas to bilateral arms and abdomen.

## 2015-08-11 NOTE — ED Provider Notes (Signed)
CSN: [id number]     Arrival date & time 08/11/15  2211 History  By signing my name below, I, Starleen Arms, attest that this documentation has been prepared under the direction and in the presence of Gloriann Loan, PA-C. Electronically Signed: Starleen Arms ED Scribe. 08/11/2015. 11:37 PM.    Chief Complaint  Patient presents with  . Rash    The history is provided by the patient. No language interpreter was used.   HPI Comments: Alicia Coffey is a 44 y.o. female who presents to the Emergency Department complaining of non-painful itching, worsening rash initially onset on the right arm and abdomen onset 2-3 days.  The rash has since spread to the left arm but nowhere else.  The patient has used alcohol, benadryl, and hydrocortisone cream without relief.  She denies new environmental exposure.  The patient denies hx of similar rash.  She denies fever, SOB.    Past Medical History  Diagnosis Date  . Hyperlipidemia   . Hypertension     under control, has been on med. > 1 yr.  . Diabetes mellitus     IDDM  . 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 Arthritis     right knee  . Wears dentures     upper  . Wears partial dentures     lower  . 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 Disc degeneration, lumbar   . Anemia   . Fibromyalgia   . Vertigo 2015  . Anxiety   . Jerky body movements     while awake and asleep   Past Surgical History  Procedure Laterality Date  . No past surgeries    . Breast lumpectomy with needle localization  08/15/2012    Procedure: BREAST LUMPECTOMY WITH NEEDLE LOCALIZATION;  Surgeon: Haywood Lasso, MD;  Location: [facility];  Service: General;  Laterality: Right;  Wire localizations Right breast calcifications  . Breast surgery    . Refractive surgery Right     micro aneuysms  . Dilitation & currettage/hystroscopy with thermachoice ablation N/A 08/21/2014    Procedure: DILATATION & CURETTAGE/HYSTEROSCOPY WITH  THERMACHOICE ABLATION;  Surgeon: Jonnie Kind, MD;  Location: [facility];  Service: Gynecology;  Laterality: N/A;  . Polypectomy N/A 08/21/2014    Procedure: ENDOMETRIAL POLYPECTOMY;  Surgeon: Jonnie Kind, MD;  Location: [facility];  Service: Gynecology;  Laterality: N/A;  . Knee arthroscopy with medial menisectomy Right 07/17/2015    Procedure: KNEE ARTHROSCOPY WITH MEDIAL MENISECTOMY;  Surgeon: Frederik Pear, MD;  Location: [facility];  Service: Orthopedics;  Laterality: Right;   Family History  Problem Relation Age of Onset  . Breast cancer Paternal Grandmother 65    breast; dbl mastectomy  . Diabetes Father   . Heart disease Father   . Hypertension Father   . Heart attack Father   . Diabetes Mother   . Heart disease Mother   . Hypertension Mother   . Diabetes Sister   . Fibromyalgia Sister   . Diabetes Sister   . Heart attack Maternal Grandmother     <35  . Heart attack Maternal Grandfather   . Heart attack Paternal Grandfather   . Diabetes Brother    Social History  Substance Use Topics  . Smoking status: Never Smoker   . Smokeless tobacco: Never Used  . Alcohol Use: No   OB History    Gravida Para Term Preterm AB TAB SAB Ectopic Multiple Living  0              Review of Systems  A complete 10 system review of systems was obtained and all systems are negative except as noted in the HPI and PMH.    Allergies  Other; Penicillins; and Seasonal ic  Home Medications   Prior to Admission medications   Medication Sig Start Date End Date Taking? Authorizing Provider  amitriptyline (ELAVIL) 75 MG tablet Take 75 mg by mouth at bedtime.    Historical Provider, MD  docusate sodium (COLACE) 250 MG capsule Take 250 mg by mouth daily.    Historical Provider, MD  DULoxetine (CYMBALTA) 60 MG capsule Take 1 capsule (60 mg total) by mouth daily. 12/14/13   Marcial Pacas, MD  fish oil-omega-3 fatty acids 1000 MG capsule Take 1 g by mouth 2 (two) times daily.     Historical  Provider, MD  glipiZIDE (GLUCOTROL) 5 MG tablet Take by mouth daily before breakfast.    Historical Provider, MD  HYDROcodone-acetaminophen (NORCO) 5-325 MG tablet Take 1 tablet by mouth every 6 (six) hours as needed. 07/17/15   Leighton Parody, PA-C  hydrocortisone cream 1 % Apply to affected area 2 times daily 08/12/15   Gloriann Loan, PA-C  insulin glargine (LANTUS) 100 UNIT/ML injection Inject 32 Units into the skin at bedtime.     Historical Provider, MD  Insulin Lispro Prot & Lispro (HUMALOG MIX 50/50 KWIKPEN) (50-50) 100 UNIT/ML Kwikpen Inject into the skin.    Historical Provider, MD  IRON, FERROUS GLUCONATE, PO Take 65 mg by mouth daily.    Historical Provider, MD  lisinopril-hydrochlorothiazide (PRINZIDE,ZESTORETIC) 20-25 MG per tablet Take 1 tablet by mouth 2 (two) times daily.    Historical Provider, MD  Magnesium 250 MG TABS Take 1 tablet by mouth 2 (two) times daily.     Historical Provider, MD  methocarbamol (ROBAXIN) 500 MG tablet Take 500 mg by mouth 4 (four) times daily.    Historical Provider, MD  predniSONE (DELTASONE) 20 MG tablet Take 2 tablets (40 mg total) by mouth daily. 08/12/15   Gloriann Loan, PA-C  rosuvastatin (CRESTOR) 40 MG tablet Take 40 mg by mouth daily.    Historical Provider, MD  sitaGLIPtin-metformin (JANUMET) 50-1000 MG tablet Take 1 tablet by mouth 2 (two) times daily with a meal.    Historical Provider, MD  tamoxifen (NOLVADEX) 20 MG tablet Take 1 tablet (20 mg total) by mouth daily. 11/21/14   Nicholas Lose, MD  traMADol (ULTRAM) 50 MG tablet Take 50 mg by mouth every 6 (six) hours as needed for moderate pain.     Historical Provider, MD   BP 133/78 mmHg  Pulse 94  Temp(Src) 98.1 F (36.7 C) (Oral)  Resp 18  SpO2 99%  LMP 08/09/2015 Physical Exam  Constitutional: She is oriented to person, place, and time. She appears well-developed and well-nourished. No distress.  HENT:  Head: Normocephalic and atraumatic.  Eyes: Conjunctivae and EOM are normal.  Neck:  Neck supple. No tracheal deviation present.  Cardiovascular: Normal rate and normal heart sounds.   Pulmonary/Chest: Effort normal and breath sounds normal. No respiratory distress.  Abdominal: Soft. Bowel sounds are normal. She exhibits no distension.  Musculoskeletal: Normal range of motion.  Neurological: She is alert and oriented to person, place, and time.  Skin: Skin is warm and dry.  Multiple circular areas of erythema and swelling on the right forearm and left medial aspect of the biceps.  One small area of erythema and  swelling distal to umbilicus.  Areas consistent with insect bites.  No hives.  No signs of infection.  Psychiatric: She has a normal mood and affect. Her behavior is normal.  Nursing note and vitals reviewed.   ED Course  Procedures (including critical care time)  DIAGNOSTIC STUDIES: Oxygen Saturation is 99% on RA, normal by my interpretation.    COORDINATION OF CARE:  11:35 PM Discussed treatment plan with patient at bedside.  Patient acknowledges and agrees with plan.    Labs Review Labs Reviewed - No data to display  Imaging Review No results found. I have personally reviewed and evaluated these images and lab results as part of my medical decision-making.   EKG Interpretation None      MDM   Final diagnoses:  Insect bite    Patient presents with rash for the past couple of days. VSS, NAD. Pruritic lesions noted on right forearm, left upper arm, and 1 distal to the umbilicus. Minimal swelling. Suspect insect bites. Patient advised to continue taking OTC Benadryl. We'll discharge home with hydrocortisone cream and prednisone. Discussed return precautions. Patient agrees and acknowledges the above plan for discharge.  I personally performed the services described in this documentation, which was scribed in my presence. The recorded information has been reviewed and is accurate.    Gloriann Loan, PA-C 08/12/15 0018  Daleen Bo, MD 08/14/15  [phone]

## 2015-08-12 MED ORDER — HYDROCORTISONE 1 % EX CREA: TOPICAL_CREAM | CUTANEOUS | Status: AC

## 2015-08-12 MED ORDER — PREDNISONE 20 MG PO TABS: 40.0000 mg | ORAL_TABLET | Freq: Every day | ORAL | Status: DC

## 2015-08-12 NOTE — ED Notes (Signed)
Pt verbalized understanding of prednisone and hydrocortisone use. Pt stable and NAD.

## 2015-08-12 NOTE — Discharge Instructions (Signed)
Insect Bite Mosquitoes, flies, fleas, bedbugs, and many other insects can bite. Insect bites are different from insect stings. A sting is when poison (venom) is injected into the skin. Insect bites can cause pain or itching for a few days, but they are usually not serious. Some insects can spread diseases to people through a bite. SYMPTOMS  Symptoms of an insect bite include:  Itching or pain in the bite area.  Redness and swelling in the bite area.  An open wound (skin ulcer). In many cases, symptoms last for 2-4 days.  DIAGNOSIS  This condition is usually diagnosed based on symptoms and a physical exam. TREATMENT  Treatment is usually not needed for an insect bite. Symptoms often go away on their own. Your health care provider may recommend creams or lotions to help reduce itching. Antibiotic medicines may be prescribed if the bite becomes infected. A tetanus shot may be given in some cases. If you develop an allergic reaction to an insect bite, your health care provider will prescribe medicines to treat the reaction (antihistamines). This is rare. HOME CARE INSTRUCTIONS  Do not scratch the bite area.  Keep the bite area clean and dry. Wash the bite area daily with soap and water as told by your health care provider.  If directed, applyice to the bite area.  Put ice in a plastic bag.  Place a towel between your skin and the bag.  Leave the ice on for 20 minutes, 2-3 times per day.  To help reduce itching and swelling, try applying a baking soda paste, cortisone cream, or calamine lotion to the bite area as told by your health care provider.  Apply or take over-the-counter and prescription medicines only as told by your health care provider.  If you were prescribed an antibiotic medicine, use it as told by your health care provider. Do not stop using the antibiotic even if your condition improves.  Keep all follow-up visits as told by your health care provider. This is  important. PREVENTION   Use insect repellent. The best insect repellents contain:  DEET, picaridin, oil of lemon eucalyptus (OLE), or IR3535.  Higher amounts of an active ingredient.  When you are outdoors, wear clothing that covers your arms and legs.  Avoid opening windows that do not have window screens. SEEK MEDICAL CARE IF:  You have increased redness, swelling, or pain in the bite area.  You have a fever. SEEK IMMEDIATE MEDICAL CARE IF:   You have joint pain.   You have fluid, blood, or pus coming from the bite area.  You have a headache or neck pain.  You have unusual weakness.  You have a rash.  You have chest pain or shortness of breath.  You have abdominal pain, nausea, or vomiting.  You feel unusually tired or sleepy.   This information is not intended to replace advice given to you by your health care provider. Make sure you discuss any questions you have with your health care provider.   Document Released: 11/05/2004 Document Revised: 06/19/2015 Document Reviewed: 02/13/2015 Elsevier Interactive Patient Education 2016 [employer]  

## 2015-09-16 ENCOUNTER — Other Ambulatory Visit: Payer: Self-pay

## 2015-09-16 DIAGNOSIS — D500 Iron deficiency anemia secondary to blood loss (chronic): Secondary | ICD-10-CM

## 2015-09-16 DIAGNOSIS — N6099 Unspecified benign mammary dysplasia of unspecified breast: Secondary | ICD-10-CM

## 2015-09-17 ENCOUNTER — Ambulatory Visit (INDEPENDENT_AMBULATORY_CARE_PROVIDER_SITE_OTHER): Payer: Medicaid Other | Admitting: Podiatry

## 2015-09-17 ENCOUNTER — Other Ambulatory Visit (HOSPITAL_BASED_OUTPATIENT_CLINIC_OR_DEPARTMENT_OTHER): Payer: Medicaid Other

## 2015-09-17 ENCOUNTER — Ambulatory Visit (HOSPITAL_BASED_OUTPATIENT_CLINIC_OR_DEPARTMENT_OTHER): Payer: Medicaid Other | Admitting: Hematology and Oncology

## 2015-09-17 ENCOUNTER — Encounter: Payer: Self-pay | Admitting: Hematology and Oncology

## 2015-09-17 ENCOUNTER — Encounter: Payer: Self-pay | Admitting: Podiatry

## 2015-09-17 ENCOUNTER — Telehealth: Payer: Self-pay | Admitting: Hematology and Oncology

## 2015-09-17 VITALS — BP 128/74 | HR 93 | Temp 98.0°F | Resp 18 | Ht 63.0 in | Wt 256.4 lb

## 2015-09-17 DIAGNOSIS — M79609 Pain in unspecified limb: Principal | ICD-10-CM

## 2015-09-17 DIAGNOSIS — D0501 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: Secondary | ICD-10-CM

## 2015-09-17 DIAGNOSIS — N6099 Unspecified benign mammary dysplasia of unspecified breast: Secondary | ICD-10-CM

## 2015-09-17 DIAGNOSIS — M79673 Pain in unspecified foot: Secondary | ICD-10-CM | POA: Diagnosis not present

## 2015-09-17 DIAGNOSIS — D500 Iron deficiency anemia secondary to blood loss (chronic): Secondary | ICD-10-CM

## 2015-09-17 DIAGNOSIS — B351 Tinea unguium: Secondary | ICD-10-CM

## 2015-09-17 DIAGNOSIS — N951 Menopausal and female climacteric states: Secondary | ICD-10-CM | POA: Diagnosis not present

## 2015-09-17 LAB — CBC & DIFF AND RETIC
BASO%: 0.3 % (ref 0.0–2.0)
BASOS ABS: 0 10*3/uL (ref 0.0–0.1)
EOS ABS: 0.4 10*3/uL (ref 0.0–0.5)
EOS%: 4.3 % (ref 0.0–7.0)
HEMATOCRIT: 33.9 % — AB (ref 34.8–46.6)
HEMOGLOBIN: 11.2 g/dL — AB (ref 11.6–15.9)
Immature Retic Fract: 7.6 % (ref 1.60–10.00)
LYMPH#: 2.6 10*3/uL (ref 0.9–3.3)
LYMPH%: 28.7 % (ref 14.0–49.7)
MCH: 28.5 pg (ref 25.1–34.0)
MCHC: 33 g/dL (ref 31.5–36.0)
MCV: 86.3 fL (ref 79.5–101.0)
MONO#: 0.6 10*3/uL (ref 0.1–0.9)
MONO%: 6.5 % (ref 0.0–14.0)
NEUT%: 60.2 % (ref 38.4–76.8)
NEUTROS ABS: 5.5 10*3/uL (ref 1.5–6.5)
Platelets: 171 10*3/uL (ref 145–400)
RBC: 3.93 10*6/uL (ref 3.70–5.45)
RDW: 13.3 % (ref 11.2–14.5)
RETIC %: 1.69 % (ref 0.70–2.10)
RETIC CT ABS: 66.42 10*3/uL (ref 33.70–90.70)
WBC: 9.1 10*3/uL (ref 3.9–10.3)

## 2015-09-17 LAB — IRON AND TIBC
%SAT: 21 % (ref 21–57)
IRON: 59 ug/dL (ref 41–142)
TIBC: 286 ug/dL (ref 236–444)
UIBC: 227 ug/dL (ref 120–384)

## 2015-09-17 LAB — COMPREHENSIVE METABOLIC PANEL
ALBUMIN: 3.6 g/dL (ref 3.5–5.0)
ALK PHOS: 60 U/L (ref 40–150)
ALT: 15 U/L (ref 0–55)
AST: 22 U/L (ref 5–34)
Anion Gap: 9 mEq/L (ref 3–11)
BILIRUBIN TOTAL: 0.33 mg/dL (ref 0.20–1.20)
BUN: 23.6 mg/dL (ref 7.0–26.0)
CALCIUM: 9.3 mg/dL (ref 8.4–10.4)
CO2: 25 mEq/L (ref 22–29)
CREATININE: 1.2 mg/dL — AB (ref 0.6–1.1)
Chloride: 105 mEq/L (ref 98–109)
EGFR: 63 mL/min/{1.73_m2} — ABNORMAL LOW (ref >90)
GLUCOSE: 140 mg/dL (ref 70–140)
POTASSIUM: 4.4 meq/L (ref 3.5–5.1)
Sodium: 139 mEq/L (ref 136–145)
TOTAL PROTEIN: 6.9 g/dL (ref 6.4–8.3)

## 2015-09-17 LAB — VITAMIN B12: Vitamin B-12: 437 pg/mL (ref 211–911)

## 2015-09-17 LAB — FOLATE: Folate: 15 ng/mL

## 2015-09-17 NOTE — Addendum Note (Signed)
Addended by: Prentiss Bells on: 09/17/2015 12:23 PM   Modules accepted: Medications

## 2015-09-17 NOTE — Assessment & Plan Note (Signed)
Right breast LCIS: Status post lumpectomy 08/15/2012 and is currently on tamoxifen 20 mg daily since December 2013  Tamoxifen toxicities 1. Uterine hyperplasia/followup status post ablation recently 2. Hot flashes  Patient was also diagnosed with fibromyalgia and neuropathy and is on multiple medications for that.  Breast Cancer Surveillance: 1. Breast exam 09/17/2015: Normal 2. Mammogram 01/07/2015 No abnormalities. Postsurgical changes. Breast Density Category D. I recommended that she get 3-D mammograms for surveillance. Discussed the differences between different breast density categories.   Return to clinic in 1 year for follow-up

## 2015-09-17 NOTE — Progress Notes (Signed)
Patient Care Team: Claris Gladden. Ouida Sills, FNP as PCP - General (Nurse Practitioner)  Diagnosis: LCIS right breast  PRIOR THERAPY: #1She recently had a mammogram performed that showed abnormality in the right breast. Because of this she went on to have a needle core biopsy performed that showed only atypical lobular hyperplasia. She was seen by Dr. Neldon Mc who performed a lumpectomy on 08/15/2012. The final pathology of the right lumpectomy showed lobular neoplasia (lobular carcinoma in situ) with calcifications fibrocystic changes with calcifications. Margins were negative  #2  chemoprevention with tamoxifen 20 mg daily started December 2013  CHIEF COMPLIANT: recent right knee surgery with marked improvement in the knee pain left knee surgery to happen in January.  INTERVAL HISTORY: Alicia Coffey is a 44 year old with above-mentioned history of LCIS in the right breast treated with lumpectomy and is currently on risk reduction therapy with tamoxifen. She appears to be tolerating it very well. She does not have hot flashes. She no longer has excessive uterine bleeding after the ablation bleeding has subsided. She occasionally gets a period. She continues to suffer from fibromyalgia which causes diffuse aches and pains throughout her body.  REVIEW OF SYSTEMS:   Constitutional: Denies fevers, chills or abnormal weight loss Eyes: Denies blurriness of vision Ears, nose, mouth, throat, and face: Denies mucositis or sore throat Respiratory: Denies cough, dyspnea or wheezes Cardiovascular: Denies palpitation, chest discomfort or lower extremity swelling Gastrointestinal:  Denies nausea, heartburn or change in bowel habits Skin: Denies abnormal skin rashes Lymphatics: Denies new lymphadenopathy or easy bruising Neurological:Denies numbness, tingling or new weaknesses Behavioral/Psych: Mood is stable, no new changes  Breast: breasts feel lumpy bilaterally All other systems were reviewed with  the patient and are negative.  I have reviewed the past medical history, past surgical history, social history and family history with the patient and they are unchanged from previous note.  ALLERGIES:  is allergic to other; penicillins; and seasonal ic.  MEDICATIONS:  Current Outpatient Prescriptions  Medication Sig Dispense Refill  . amitriptyline (ELAVIL) 75 MG tablet Take 75 mg by mouth at bedtime.    . docusate sodium (COLACE) 250 MG capsule Take 250 mg by mouth daily.    . DULoxetine (CYMBALTA) 60 MG capsule Take 1 capsule (60 mg total) by mouth daily. 30 capsule 12  . fish oil-omega-3 fatty acids 1000 MG capsule Take 1 g by mouth 2 (two) times daily.     Marland Kitchen glipiZIDE (GLUCOTROL) 5 MG tablet Take by mouth daily before breakfast.    . HYDROcodone-acetaminophen (NORCO) 5-325 MG tablet Take 1 tablet by mouth every 6 (six) hours as needed. 60 tablet 0  . hydrocortisone cream 1 % Apply to affected area 2 times daily 15 g 0  . insulin glargine (LANTUS) 100 UNIT/ML injection Inject 32 Units into the skin at bedtime.     . Insulin Lispro Prot & Lispro (HUMALOG MIX 50/50 KWIKPEN) (50-50) 100 UNIT/ML Kwikpen Inject into the skin.    . IRON, FERROUS GLUCONATE, PO Take 65 mg by mouth daily.    Marland Kitchen lisinopril-hydrochlorothiazide (PRINZIDE,ZESTORETIC) 20-25 MG per tablet Take 1 tablet by mouth 2 (two) times daily.    . Magnesium 250 MG TABS Take 1 tablet by mouth 2 (two) times daily.     . methocarbamol (ROBAXIN) 500 MG tablet Take 500 mg by mouth 4 (four) times daily.    . predniSONE (DELTASONE) 20 MG tablet Take 2 tablets (40 mg total) by mouth daily. 6 tablet 0  . rosuvastatin (  CRESTOR) 40 MG tablet Take 40 mg by mouth daily.    . sitaGLIPtin-metformin (JANUMET) 50-1000 MG tablet Take 1 tablet by mouth 2 (two) times daily with a meal.    . tamoxifen (NOLVADEX) 20 MG tablet Take 1 tablet (20 mg total) by mouth daily. 90 tablet 3  . traMADol (ULTRAM) 50 MG tablet Take 50 mg by mouth every 6 (six)  hours as needed for moderate pain.      No current facility-administered medications for this visit.    PHYSICAL EXAMINATION: ECOG PERFORMANCE STATUS: 1 - Symptomatic but completely ambulatory  Filed Vitals:   09/17/15 1034  BP: 128/74  Pulse: 93  Temp: 98 F (36.7 C)  Resp: 18   Filed Weights   09/17/15 1034  Weight: 256 lb 6.4 oz (116.302 kg)    GENERAL:alert, no distress and comfortable SKIN: skin color, texture, turgor are normal, no rashes or significant lesions EYES: normal, Conjunctiva are pink and non-injected, sclera clear OROPHARYNX:no exudate, no erythema and lips, buccal mucosa, and tongue normal  NECK: supple, thyroid normal size, non-tender, without nodularity LYMPH:  no palpable lymphadenopathy in the cervical, axillary or inguinal LUNGS: clear to auscultation and percussion with normal breathing effort HEART: regular rate & rhythm and no murmurs and no lower extremity edema ABDOMEN:abdomen soft, non-tender and normal bowel sounds Musculoskeletal:no cyanosis of digits and no clubbing  NEURO: alert & oriented x 3 with fluent speech, no focal motor/sensory deficits BREAST:both breasts feel very lumpy and nodular in nature. It is very difficult to palpate her breasts. (exam performed in the presence of a chaperone)  LABORATORY DATA:  I have reviewed the data as listed   Chemistry      Component Value Date/Time   NA 139 07/15/2015 1210   NA 141 09/13/2014 0942   K 4.3 07/15/2015 1210   K 4.1 09/13/2014 0942   CL 102 07/15/2015 1210   CL 106 01/09/2013 0855   CO2 27 07/15/2015 1210   CO2 26 09/13/2014 0942   BUN 23* 07/15/2015 1210   BUN 20.8 09/13/2014 0942   CREATININE 1.21* 07/15/2015 1210   CREATININE 1.3* 09/13/2014 0942      Component Value Date/Time   CALCIUM 8.9 07/15/2015 1210   CALCIUM 9.3 09/13/2014 0942   ALKPHOS 73 09/13/2014 0942   AST 18 09/13/2014 0942   ALT 18 09/13/2014 0942   BILITOT 0.24 09/13/2014 0942       Lab Results    Component Value Date   WBC 9.1 09/17/2015   HGB 11.2* 09/17/2015   HCT 33.9* 09/17/2015   MCV 86.3 09/17/2015   PLT 171 09/17/2015   NEUTROABS 5.5 09/17/2015    ASSESSMENT & PLAN:  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 Uterine hyperplasia/Polyp status post ablation: No further problems with uterine bleeding 2. Hot flashes  Patient was also diagnosed with fibromyalgia and neuropathy and is on multiple medications for that.  Breast Cancer Surveillance: 1. Breast exam 09/17/2015: lumpy and nodular breasts. 2. Mammogram 01/07/2015 No abnormalities. Postsurgical changes. Breast Density Category D. I recommended that she get 3-D mammograms for surveillance.I sent an order for that to be done at the end of March 2017. Discussed the differences between different breast density categories.   Return to clinic in 1 year for follow-up   No orders of the defined types were placed in this encounter.  The patient has a good understanding of the overall plan. she agrees with it. she will call with any problems that may develop before the next visit here.   Rulon Eisenmenger, MD 09/17/2015

## 2015-09-17 NOTE — Progress Notes (Signed)
Patient ID: Alicia Coffey, female   DOB: 16-Oct-1970, 44 y.o.   MRN: [id number] Complaint:  Visit Type: Patient returns to my office for continued preventative foot care services. Complaint: Patient states" my nails have grown long and thick and become painful to walk and wear shoes" Patient has been diagnosed with DM with neuropathy. The patient presents for preventative foot care services. No changes to ROS  Podiatric Exam: Vascular: dorsalis pedis and posterior tibial pulses are palpable bilateral. Capillary return is immediate. Temperature gradient is WNL. Skin turgor WNL  Sensorium: Diminished  Semmes Weinstein monofilament test. Normal tactile sensation bilaterally. Nail Exam: Pt has thick disfigured discolored nails with subungual debris noted bilateral entire nail hallux through fifth toenails Ulcer Exam: There is no evidence of ulcer or pre-ulcerative changes or infection. Orthopedic Exam: Muscle tone and strength are WNL. No limitations in general ROM. No crepitus or effusions noted. Foot type and digits show no abnormalities. Bony prominences are unremarkable. Rigid hammer toes  3,4 B/L Skin: No Porokeratosis. No infection or ulcers  Diagnosis:  Onychomycosis, , Pain in right toe, pain in left toes  Treatment & Plan Procedures and Treatment: Consent by patient was obtained for treatment procedures. The patient understood the discussion of treatment and procedures well. All questions were answered thoroughly reviewed. Debridement of mycotic and hypertrophic toenails, 1 through 5 bilateral and clearing of subungual debris. No ulceration, no infection noted. She does relate cramping in her forefoot.  Need to reschedule for neuroma work-up Return Visit-Office Procedure: Patient instructed to return to the office for a follow up visit 3 months for continued evaluation and treatment.  Gardiner Barefoot DPM

## 2015-09-17 NOTE — Telephone Encounter (Signed)
Appointments made and avs printed for patient,mammo order was placed wrong and has been sent to dr Perlie Gold to alter change.  Patient has the information to give them a call in a week or so to get on schedule.   anne

## 2015-09-30 ENCOUNTER — Other Ambulatory Visit: Payer: Self-pay | Admitting: Orthopedic Surgery

## 2015-10-09 ENCOUNTER — Encounter (HOSPITAL_BASED_OUTPATIENT_CLINIC_OR_DEPARTMENT_OTHER): Payer: Self-pay | Admitting: *Deleted

## 2015-10-09 ENCOUNTER — Other Ambulatory Visit: Payer: Self-pay | Admitting: *Deleted

## 2015-10-13 DIAGNOSIS — G4733 Obstructive sleep apnea (adult) (pediatric): Secondary | ICD-10-CM

## 2015-10-13 HISTORY — DX: Obstructive sleep apnea (adult) (pediatric): G47.33

## 2015-10-15 NOTE — H&P (Signed)
Alicia Coffey is an 45 y.o. female.   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  She ambulates with a cane but this is more for the left knee which also has a meniscal tear that she would like to have addressed sometime in the next couple of months.  Past Medical History  Diagnosis Date  . Hyperlipidemia   . Hypertension     under control, has been on med. > 1 yr.  . Diabetes mellitus     IDDM  . 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 Arthritis     right knee  . Wears dentures     upper  . Wears partial dentures     lower  . 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 Disc degeneration, lumbar   . Anemia   . Fibromyalgia   . Vertigo 2015  . Anxiety   . Jerky body movements     while awake and asleep  . Shortness of breath dyspnea     with exertion  . Depression     Past Surgical History  Procedure Laterality Date  . Breast lumpectomy with needle localization  08/15/2012    Procedure: BREAST LUMPECTOMY WITH NEEDLE LOCALIZATION;  Surgeon: Alicia Lasso, MD;  Location: [place];  Service: General;  Laterality: Right;  Wire localizations Right breast calcifications  . Breast surgery    . Refractive surgery Right     micro aneuysms  . Dilitation & currettage/hystroscopy with thermachoice ablation N/A 08/21/2014    Procedure: DILATATION & CURETTAGE/HYSTEROSCOPY WITH THERMACHOICE ABLATION;  Surgeon: Alicia Kind, MD;  Location: [facility];  Service: Gynecology;  Laterality: N/A;  . Polypectomy N/A 08/21/2014    Procedure: ENDOMETRIAL POLYPECTOMY;  Surgeon: Alicia Kind, MD;  Location: [facility];  Service: Gynecology;  Laterality: N/A;  . Knee arthroscopy with medial menisectomy Right 07/17/2015    Procedure: KNEE ARTHROSCOPY WITH MEDIAL MENISECTOMY;  Surgeon: Alicia Pear, MD;  Location: [place];   Service: Orthopedics;  Laterality: Right;    Family History  Problem Relation Age of Onset  . Breast cancer Paternal Grandmother 73    breast; dbl mastectomy  . Diabetes Father   . Heart disease Father   . Hypertension Father   . Heart attack Father   . Diabetes Mother   . Heart disease Mother   . Hypertension Mother   . Diabetes Sister   . Fibromyalgia Sister   . Diabetes Sister   . Heart attack Maternal Grandmother     <35  . Heart attack Maternal Grandfather   . Heart attack Paternal Grandfather   . Diabetes Brother    Social History:  reports that she has never smoked. She has never used smokeless tobacco. She reports that she does not drink alcohol or use illicit drugs.  Allergies:  Allergies  Allergen Reactions  . Other Itching    Powered gloves  . Penicillins   . Seasonal Ic [Cholestatin]     Sneezing, runny nose    No prescriptions prior to admission    No results found for this or any previous visit (from the past 48 hour(s)). No results found.  Review of Systems  Constitutional: Positive for malaise/fatigue.  HENT: Positive for congestion.   Eyes: Positive for blurred vision.  Cardiovascular: Positive for palpitations and leg swelling.       HTN  Gastrointestinal: Positive for constipation.  Musculoskeletal: Positive for myalgias and joint pain.  Neurological: Positive for dizziness, focal weakness and headaches.  Endo/Heme/Allergies:       Prolonged bleeding and blood sugar problem  Psychiatric/Behavioral: Positive for depression.    Height 5\' 3"  (1.6 m), weight 116.121 kg (256 lb). Physical Exam  Constitutional: She is oriented to person, place, and time. She appears well-developed and well-nourished.  HENT:  Head: Normocephalic and atraumatic.  Eyes: Pupils are equal, round, and reactive to light.  Neck: Normal range of motion. Neck supple.  Cardiovascular: Intact distal pulses.   Respiratory: Effort normal.  Musculoskeletal: She exhibits  tenderness.    The left knee is tender along the medial joint line pain with extreme flexion to 125 she does come to full extension, that also irritates the medial joint line some.  There is no palpable effusion on the left knee.  Neurological: She is alert and oriented to person, place, and time.  Skin: Skin is warm and dry.  Psychiatric: She has a normal mood and affect. Her behavior is normal. Judgment and thought content normal.     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  Plan: Continue home exercise program.  We will get her set up for left knee arthroscopy at her convenience.  I will see her back at the time surgical intervention.  Alicia Coffey R 10/15/2015, 12:44 PM

## 2015-10-16 ENCOUNTER — Encounter (HOSPITAL_BASED_OUTPATIENT_CLINIC_OR_DEPARTMENT_OTHER)
Admission: RE | Disposition: A | Discharge status: Home / Self Care | Payer: Self-pay | Source: Ambulatory Visit | Attending: Orthopedic Surgery

## 2015-10-16 ENCOUNTER — Encounter (HOSPITAL_BASED_OUTPATIENT_CLINIC_OR_DEPARTMENT_OTHER): Payer: Self-pay | Admitting: Certified Registered"

## 2015-10-16 ENCOUNTER — Ambulatory Visit (HOSPITAL_BASED_OUTPATIENT_CLINIC_OR_DEPARTMENT_OTHER): Payer: Medicaid Other | Admitting: Anesthesiology

## 2015-10-16 ENCOUNTER — Ambulatory Visit (HOSPITAL_BASED_OUTPATIENT_CLINIC_OR_DEPARTMENT_OTHER)
Admission: RE | Admit: 2015-10-16 | Discharge: 2015-10-16 | Disposition: A | Discharge status: Home / Self Care | Payer: Medicaid Other | Source: Ambulatory Visit | Attending: Orthopedic Surgery | Admitting: Orthopedic Surgery

## 2015-10-16 DIAGNOSIS — M23201 Derangement of unspecified lateral meniscus due to old tear or injury, left knee: Secondary | ICD-10-CM | POA: Insufficient documentation

## 2015-10-16 DIAGNOSIS — S83242A Other tear of medial meniscus, current injury, left knee, initial encounter: Secondary | ICD-10-CM | POA: Insufficient documentation

## 2015-10-16 DIAGNOSIS — Z794 Long term (current) use of insulin: Secondary | ICD-10-CM | POA: Insufficient documentation

## 2015-10-16 DIAGNOSIS — M1710 Unilateral primary osteoarthritis, unspecified knee: Secondary | ICD-10-CM | POA: Diagnosis not present

## 2015-10-16 DIAGNOSIS — M797 Fibromyalgia: Secondary | ICD-10-CM | POA: Insufficient documentation

## 2015-10-16 DIAGNOSIS — Z853 Personal history of malignant neoplasm of breast: Secondary | ICD-10-CM | POA: Insufficient documentation

## 2015-10-16 DIAGNOSIS — E785 Hyperlipidemia, unspecified: Secondary | ICD-10-CM | POA: Diagnosis not present

## 2015-10-16 DIAGNOSIS — M2242 Chondromalacia patellae, left knee: Secondary | ICD-10-CM | POA: Insufficient documentation

## 2015-10-16 DIAGNOSIS — Z880 Allergy status to penicillin: Secondary | ICD-10-CM | POA: Insufficient documentation

## 2015-10-16 DIAGNOSIS — X58XXXA Exposure to other specified factors, initial encounter: Secondary | ICD-10-CM | POA: Insufficient documentation

## 2015-10-16 DIAGNOSIS — I10 Essential (primary) hypertension: Secondary | ICD-10-CM | POA: Insufficient documentation

## 2015-10-16 DIAGNOSIS — E119 Type 2 diabetes mellitus without complications: Secondary | ICD-10-CM | POA: Insufficient documentation

## 2015-10-16 DIAGNOSIS — Z6841 Body Mass Index (BMI) 40.0 and over, adult: Secondary | ICD-10-CM | POA: Insufficient documentation

## 2015-10-16 DIAGNOSIS — M94262 Chondromalacia, left knee: Secondary | ICD-10-CM | POA: Diagnosis not present

## 2015-10-16 HISTORY — DX: Depression, unspecified: F32.A

## 2015-10-16 HISTORY — PX: KNEE ARTHROSCOPY: SHX127

## 2015-10-16 HISTORY — DX: Major depressive disorder, single episode, unspecified: F32.9

## 2015-10-16 HISTORY — DX: Reserved for inherently not codable concepts without codable children: IMO0001

## 2015-10-16 LAB — GLUCOSE, CAPILLARY
GLUCOSE-CAPILLARY: 147 mg/dL — AB (ref 65–99)
Glucose-Capillary: 157 mg/dL — ABNORMAL HIGH (ref 65–99)

## 2015-10-16 SURGERY — ARTHROSCOPY, KNEE
Anesthesia: General | Site: Knee | Laterality: Left

## 2015-10-16 MED ORDER — BUPIVACAINE-EPINEPHRINE 0.5% -1:200000 IJ SOLN
INTRAMUSCULAR | Status: DC | PRN
  Administered 2015-10-16: 20 mL

## 2015-10-16 MED ORDER — PROPOFOL 10 MG/ML IV BOLUS
INTRAVENOUS | Status: DC | PRN
  Administered 2015-10-16: 200 mg via INTRAVENOUS

## 2015-10-16 MED ORDER — ONDANSETRON HCL 4 MG/2ML IJ SOLN
INTRAMUSCULAR | Status: AC
  Filled 2015-10-16: qty 2

## 2015-10-16 MED ORDER — LIDOCAINE HCL (CARDIAC) 20 MG/ML IV SOLN
INTRAVENOUS | Status: AC
  Filled 2015-10-16: qty 5

## 2015-10-16 MED ORDER — GLYCOPYRROLATE 0.2 MG/ML IJ SOLN: 0.2000 mg | Freq: Once | INTRAMUSCULAR | Status: DC | PRN

## 2015-10-16 MED ORDER — DEXTROSE-NACL 5-0.45 % IV SOLN: INTRAVENOUS | Status: DC

## 2015-10-16 MED ORDER — HYDROMORPHONE HCL 1 MG/ML IJ SOLN: 0.2500 mg | INTRAMUSCULAR | Status: DC | PRN

## 2015-10-16 MED ORDER — FENTANYL CITRATE (PF) 100 MCG/2ML IJ SOLN
INTRAMUSCULAR | Status: AC
  Filled 2015-10-16: qty 2

## 2015-10-16 MED ORDER — SCOPOLAMINE 1 MG/3DAYS TD PT72: 1.0000 | MEDICATED_PATCH | Freq: Once | TRANSDERMAL | Status: DC | PRN

## 2015-10-16 MED ORDER — ONDANSETRON HCL 4 MG/2ML IJ SOLN: 4.0000 mg | Freq: Four times a day (QID) | INTRAMUSCULAR | Status: DC | PRN

## 2015-10-16 MED ORDER — LIDOCAINE HCL (CARDIAC) 20 MG/ML IV SOLN
INTRAVENOUS | Status: DC | PRN
  Administered 2015-10-16: 60 mg via INTRAVENOUS

## 2015-10-16 MED ORDER — VANCOMYCIN HCL IN DEXTROSE 500-5 MG/100ML-% IV SOLN
INTRAVENOUS | Status: AC
  Filled 2015-10-16: qty 100

## 2015-10-16 MED ORDER — CHLORHEXIDINE GLUCONATE 4 % EX LIQD: 60.0000 mL | Freq: Once | CUTANEOUS | Status: DC

## 2015-10-16 MED ORDER — OXYCODONE HCL 5 MG/5ML PO SOLN: 5.0000 mg | Freq: Once | ORAL | Status: AC | PRN

## 2015-10-16 MED ORDER — METOCLOPRAMIDE HCL 5 MG/ML IJ SOLN: 5.0000 mg | Freq: Three times a day (TID) | INTRAMUSCULAR | Status: DC | PRN

## 2015-10-16 MED ORDER — ONDANSETRON HCL 4 MG/2ML IJ SOLN
INTRAMUSCULAR | Status: DC | PRN
  Administered 2015-10-16: 4 mg via INTRAVENOUS

## 2015-10-16 MED ORDER — MIDAZOLAM HCL 2 MG/2ML IJ SOLN
INTRAMUSCULAR | Status: AC
  Filled 2015-10-16: qty 2

## 2015-10-16 MED ORDER — VANCOMYCIN HCL 10 G IV SOLR
1500.0000 mg | INTRAVENOUS | Status: DC
  Administered 2015-10-16: 1500 mg via INTRAVENOUS

## 2015-10-16 MED ORDER — HYDROCODONE-ACETAMINOPHEN 5-325 MG PO TABS: 1.0000 | ORAL_TABLET | Freq: Four times a day (QID) | ORAL | Status: DC | PRN

## 2015-10-16 MED ORDER — METOCLOPRAMIDE HCL 5 MG PO TABS: 5.0000 mg | ORAL_TABLET | Freq: Three times a day (TID) | ORAL | Status: DC | PRN

## 2015-10-16 MED ORDER — GLYCOPYRROLATE 0.2 MG/ML IJ SOLN
INTRAMUSCULAR | Status: AC
  Filled 2015-10-16: qty 1

## 2015-10-16 MED ORDER — OXYCODONE HCL 5 MG PO TABS
ORAL_TABLET | ORAL | Status: AC
  Filled 2015-10-16: qty 1

## 2015-10-16 MED ORDER — ONDANSETRON HCL 4 MG PO TABS: 4.0000 mg | ORAL_TABLET | Freq: Four times a day (QID) | ORAL | Status: DC | PRN

## 2015-10-16 MED ORDER — LACTATED RINGERS IV SOLN
INTRAVENOUS | Status: DC
  Administered 2015-10-16: 11:00:00 via INTRAVENOUS

## 2015-10-16 MED ORDER — FENTANYL CITRATE (PF) 100 MCG/2ML IJ SOLN
50.0000 ug | INTRAMUSCULAR | Status: DC | PRN
  Administered 2015-10-16 (×2): 50 ug via INTRAVENOUS

## 2015-10-16 MED ORDER — VANCOMYCIN HCL IN DEXTROSE 1-5 GM/200ML-% IV SOLN
INTRAVENOUS | Status: AC
  Filled 2015-10-16: qty 200

## 2015-10-16 MED ORDER — OXYCODONE HCL 5 MG PO TABS
5.0000 mg | ORAL_TABLET | Freq: Once | ORAL | Status: AC | PRN
  Administered 2015-10-16: 5 mg via ORAL

## 2015-10-16 MED ORDER — MIDAZOLAM HCL 2 MG/2ML IJ SOLN
1.0000 mg | INTRAMUSCULAR | Status: DC | PRN
  Administered 2015-10-16: 1 mg via INTRAVENOUS

## 2015-10-16 MED ORDER — SODIUM CHLORIDE 0.9 % IR SOLN
Status: DC | PRN
  Administered 2015-10-16: 2000 mL

## 2015-10-16 SURGICAL SUPPLY — 42 items
BANDAGE ACE 6X5 VEL STRL LF (GAUZE/BANDAGES/DRESSINGS) ×2 IMPLANT
BLADE 4.2CUDA (BLADE) IMPLANT
BLADE CUTTER GATOR 3.5 (BLADE) ×2 IMPLANT
BLADE GREAT WHITE 4.2 (BLADE) IMPLANT
BNDG COHESIVE 6X5 TAN STRL LF (GAUZE/BANDAGES/DRESSINGS) ×2 IMPLANT
DRAPE ARTHROSCOPY W/POUCH 114 (DRAPES) ×2 IMPLANT
DURAPREP 26ML APPLICATOR (WOUND CARE) ×2 IMPLANT
ELECT MENISCUS 165MM 90D (ELECTRODE) IMPLANT
ELECT REM PT RETURN 9FT ADLT (ELECTROSURGICAL) ×2
ELECTRODE REM PT RTRN 9FT ADLT (ELECTROSURGICAL) ×1 IMPLANT
GAUZE SPONGE 4X4 12PLY STRL (GAUZE/BANDAGES/DRESSINGS) ×2 IMPLANT
GAUZE XEROFORM 1X8 LF (GAUZE/BANDAGES/DRESSINGS) ×2 IMPLANT
GLOVE BIO SURGEON STRL SZ7.5 (GLOVE) IMPLANT
GLOVE BIO SURGEON STRL SZ8.5 (GLOVE) ×2 IMPLANT
GLOVE BIOGEL PI IND STRL 8 (GLOVE) ×1 IMPLANT
GLOVE BIOGEL PI IND STRL 9 (GLOVE) ×1 IMPLANT
GLOVE BIOGEL PI INDICATOR 8 (GLOVE) ×1
GLOVE BIOGEL PI INDICATOR 9 (GLOVE) ×1
GLOVE EXAM NITRILE LRG STRL (GLOVE) ×2 IMPLANT
GLOVE SURG SS PI 7.5 STRL IVOR (GLOVE) ×4 IMPLANT
GLOVE SURG SS PI 8.5 STRL IVOR (GLOVE) ×1
GLOVE SURG SS PI 8.5 STRL STRW (GLOVE) ×1 IMPLANT
GOWN STRL REUS W/ TWL LRG LVL3 (GOWN DISPOSABLE) ×2 IMPLANT
GOWN STRL REUS W/TWL LRG LVL3 (GOWN DISPOSABLE) ×2
GOWN STRL REUS W/TWL XL LVL3 (GOWN DISPOSABLE) ×4 IMPLANT
IV NS IRRIG 3000ML ARTHROMATIC (IV SOLUTION) ×2 IMPLANT
KNEE WRAP E Z 3 GEL PACK (MISCELLANEOUS) ×2 IMPLANT
MANIFOLD NEPTUNE II (INSTRUMENTS) IMPLANT
NDL SAFETY ECLIPSE 18X1.5 (NEEDLE) ×1 IMPLANT
NEEDLE HYPO 18GX1.5 SHARP (NEEDLE) ×1
PACK ARTHROSCOPY DSU (CUSTOM PROCEDURE TRAY) ×2 IMPLANT
PACK BASIN DAY SURGERY FS (CUSTOM PROCEDURE TRAY) ×2 IMPLANT
PAD ALCOHOL SWAB (MISCELLANEOUS) ×2 IMPLANT
PENCIL BUTTON HOLSTER BLD 10FT (ELECTRODE) IMPLANT
SET ARTHROSCOPY TUBING (MISCELLANEOUS) ×1
SET ARTHROSCOPY TUBING LN (MISCELLANEOUS) ×1 IMPLANT
SLEEVE SCD COMPRESS KNEE MED (MISCELLANEOUS) ×2 IMPLANT
SYR 3ML 18GX1 1/2 (SYRINGE) IMPLANT
SYR 5ML LL (SYRINGE) ×2 IMPLANT
TOWEL OR 17X24 6PK STRL BLUE (TOWEL DISPOSABLE) ×2 IMPLANT
WAND STAR VAC 90 (SURGICAL WAND) IMPLANT
WATER STERILE IRR 1000ML POUR (IV SOLUTION) ×2 IMPLANT

## 2015-10-16 NOTE — Discharge Instructions (Addendum)
Knee Arthroscopy, Care After °Refer to this sheet in the next few weeks. These instructions provide you with information about caring for yourself after your procedure. Your health care provider may also give you more specific instructions. Your treatment has been planned according to current medical practices, but problems sometimes occur. Call your health care provider if you have any problems or questions after your procedure. °WHAT TO EXPECT AFTER THE PROCEDURE °After your procedure, it is common to have: °· Soreness. °· Pain. °HOME CARE INSTRUCTIONS °Bathing °· Do not take baths, swim, or use a hot tub until your health care provider approves. °Incision Care °· There are many different ways to close and cover an incision, including stitches, skin glue, and adhesive strips. Follow your health care provider's instructions about: °¨ Incision care. °¨ Bandage (dressing) changes and removal. °¨ Incision closure removal. °· Check your incision area every day for signs of infection. Watch for: °¨ Redness, swelling, or pain. °¨ Fluid, blood, or pus. °Activity °· Avoid strenuous activities for as long as directed by your health care provider. °· Return to your normal activities as directed by your health care provider. Ask your health care provider what activities are safe for you. °· Perform range-of-motion exercises only as directed by your health care provider. °· Do not lift anything that is heavier than 10 lb (4.5 kg). °· Do not drive or operate heavy machinery while taking pain medicine. °· If you were given crutches, use them as directed by your health care provider. °Managing Pain, Stiffness, and Swelling °· If directed, apply ice to the injured area: °¨ Put ice in a plastic bag. °¨ Place a towel between your skin and the bag. °¨ Leave the ice on for 20 minutes, 2-3 times per day. °· Raise the injured area above the level of your heart while you are sitting or lying down as directed by your health care  provider. °General Instructions °· Keep all follow-up visits as directed by your health care provider. This is important. °· Take medicines only as directed by your health care provider. °· Do not use any tobacco products, including cigarettes, chewing tobacco, or electronic cigarettes. If you need help quitting, ask your health care provider. °· If you were given compression stockings, wear them as directed by your health care provider. These stockings help prevent blood clots and reduce swelling in your legs. °SEEK MEDICAL CARE IF: °· You have severe pain with any movement of your knee. °· You notice a bad smell coming from the incision or dressing. °· You have redness, swelling, or pain at the site of your incision. °· You have fluid, blood, or pus coming from your incision. °SEEK IMMEDIATE MEDICAL CARE IF: °· You develop a rash. °· You have a fever. °· You have difficulty breathing or have shortness of breath. °· You develop pain in your calves or in the back of your knee. °· You develop chest pain. °· You develop numbness or tingling in your leg or foot. °  °This information is not intended to replace advice given to you by your health care provider. Make sure you discuss any questions you have with your health care provider. °  °Document Released: 04/17/2005 Document Revised: 02/12/2015 Document Reviewed: 09/24/2014 °Elsevier Interactive Patient Education ©2016 [employer] ° ° °Post Anesthesia Home Care Instructions ° °Activity: °Get plenty of rest for the remainder of the day. A responsible adult should stay with you for 24 hours following the procedure.  °For the next   24 hours, DO NOT: °-Drive a car °-Operate machinery °-Drink alcoholic beverages °-Take any medication unless instructed by your physician °-Make any legal decisions or sign important papers. ° °Meals: °Start with liquid foods such as gelatin or soup. Progress to regular foods as tolerated. Avoid greasy, spicy, heavy foods. If nausea and/or  vomiting occur, drink only clear liquids until the nausea and/or vomiting subsides. Call your physician if vomiting continues. ° °Special Instructions/Symptoms: °Your throat may feel dry or sore from the anesthesia or the breathing tube placed in your throat during surgery. If this causes discomfort, gargle with warm salt water. The discomfort should disappear within 24 hours. ° °If you had a scopolamine patch placed behind your ear for the management of post- operative nausea and/or vomiting: ° °1. The medication in the patch is effective for 72 hours, after which it should be removed.  Wrap patch in a tissue and discard in the trash. Wash hands thoroughly with soap and water. °2. You may remove the patch earlier than 72 hours if you experience unpleasant side effects which may include dry mouth, dizziness or visual disturbances. °3. Avoid touching the patch. Wash your hands with soap and water after contact with the patch. °  ° °

## 2015-10-16 NOTE — Transfer of Care (Signed)
Immediate Anesthesia Transfer of Care Note  Patient: Alicia Coffey  Procedure(s) Performed: Procedure(s): ARTHROSCOPY KNEE with debridment (Left)  Patient Location: PACU  Anesthesia Type:General  Level of Consciousness: awake, alert  and patient cooperative  Airway & Oxygen Therapy: Patient Spontanous Breathing and Patient connected to face mask oxygen  Post-op Assessment: Report given to RN, Post -op Vital signs reviewed and stable and Patient moving all extremities  Post vital signs: Reviewed and stable  Last Vitals:  Filed Vitals:   10/16/15 1017  BP: 108/68  Pulse: 72  Temp: 36.9 C  Resp: 20    Complications: No apparent anesthesia complications

## 2015-10-16 NOTE — Anesthesia Preprocedure Evaluation (Signed)
Anesthesia Evaluation  Patient identified by MRN, date of birth, ID band Patient awake    Reviewed: Allergy & Precautions, NPO status , Patient's Chart, lab work & pertinent test results  Airway Mallampati: II   Neck ROM: full    Dental   Pulmonary shortness of breath,    breath sounds clear to auscultation       Cardiovascular hypertension,  Rhythm:regular Rate:Normal     Neuro/Psych Anxiety Depression  Neuromuscular disease    GI/Hepatic   Endo/Other  diabetes, Type 2Morbid obesity  Renal/GU      Musculoskeletal  (+) Arthritis , Fibromyalgia -  Abdominal   Peds  Hematology  (+) anemia ,   Anesthesia Other Findings   Reproductive/Obstetrics                             Anesthesia Physical Anesthesia Plan  ASA: II  Anesthesia Plan: General   Post-op Pain Management:    Induction: Intravenous  Airway Management Planned: LMA  Additional Equipment:   Intra-op Plan:   Post-operative Plan:   Informed Consent: I have reviewed the patients History and Physical, chart, labs and discussed the procedure including the risks, benefits and alternatives for the proposed anesthesia with the patient or authorized representative who has indicated his/her understanding and acceptance.     Plan Discussed with: CRNA, Anesthesiologist and Surgeon  Anesthesia Plan Comments:         Anesthesia Quick Evaluation

## 2015-10-16 NOTE — Anesthesia Procedure Notes (Signed)
Procedure Name: LMA Insertion Date/Time: 10/16/2015 11:38 AM Performed by: Baxter Flattery Pre-anesthesia Checklist: Patient identified, Emergency Drugs available, Suction available and Patient being monitored Patient Re-evaluated:Patient Re-evaluated prior to inductionOxygen Delivery Method: Circle System Utilized Preoxygenation: Pre-oxygenation with 100% oxygen Intubation Type: IV induction Ventilation: Mask ventilation without difficulty LMA: LMA inserted LMA Size: 4.0 Number of attempts: 1 Airway Equipment and Method: Bite block Placement Confirmation: positive ETCO2 and breath sounds checked- equal and bilateral Tube secured with: Tape Dental Injury: Teeth and Oropharynx as per pre-operative assessment

## 2015-10-16 NOTE — Op Note (Signed)
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 Mayer Camel M.D.  Assist: Kerry Hough. Barton Dubois  (present throughout entire procedure and necessary for timely completion of the procedure) Anes: General LMA  EBL: Minimal  Fluids: 800 cc   Indications: Catching popping and pain in the left knee similar to the pain she had in the right knee prior to arthroscopy in the right knee months ago.. Pt has failed conservative treatment with anti-inflammatory medicines, physical therapy, and modified activites but did get good temporarily from an intra-articular cortisone injection. Pain has recurred and patient desires elective arthroscopic evaluation and treatment of knee. Risks and benefits of surgery have been discussed and questions answered.  Procedure: Patient identified by arm band and taken to the operating room at the day surgery [facility]. The appropriate anesthetic monitors were attached, and General LMA anesthesia was induced without difficulty. Lateral post was applied to the table and the lower extremity was prepped and draped in usual sterile fashion from the ankle to the midthigh. Time out procedure was performed. We began the operation by making standard inferior lateral and inferior medial peripatellar portals with a #11 blade allowing introduction of the arthroscope through the inferior lateral portal and the out flow to the inferior medial portal. Pump pressure was set at 100 mmHg and diagnostic arthroscopy  revealed grade 3 chondromalacia apex of the patella debrided back to a stable margin with a 3.5 mm Gator sucker shaver. Moving into the medial compartment we identified posterior horn medial meniscal tear complex the transverse and radial components abraded back to a stable margin using a  straight biter and a 35 Gator sucker shaver. Anterior cruciate ligament and PCL are intact on the lateral side there were some incidental degenerative tearing lateral meniscus it was lightly debrided the gutters were cleared medially and laterally. The knee was irrigated out normal saline solution. A dressing of xerofoam 4 x 4 dressing sponges, web roll and an Ace wrap was applied. The patient was awakened extubated and taken to the recovery without difficulty.    Signed: Kerin Salen, MD

## 2015-10-16 NOTE — Interval H&P Note (Signed)
History and Physical Interval Note:  10/16/2015 11:14 AM  Alicia Coffey  has presented today for surgery, with the diagnosis of LEFT KNEE MEDIAL MENISCUS TEAR AND CHONDROMALACIA  The various methods of treatment have been discussed with the patient and family. After consideration of risks, benefits and other options for treatment, the patient has consented to  Procedure(s): ARTHROSCOPY KNEE (Left) as a surgical intervention .  The patient's history has been reviewed, patient examined, no change in status, stable for surgery.  I have reviewed the patient's chart and labs.  Questions were answered to the patient's satisfaction.     Kerin Salen

## 2015-10-16 NOTE — Anesthesia Postprocedure Evaluation (Signed)
Anesthesia Post Note  Patient: Alicia Coffey  Procedure(s) Performed: Procedure(s) (LRB): ARTHROSCOPY KNEE with debridment (Left)  Patient location during evaluation: PACU Anesthesia Type: General Level of consciousness: awake and alert and patient cooperative Pain management: pain level controlled Vital Signs Assessment: post-procedure vital signs reviewed and stable Respiratory status: spontaneous breathing and respiratory function stable Cardiovascular status: stable Anesthetic complications: no    Last Vitals:  Filed Vitals:   10/16/15 1230 10/16/15 1245  BP: 117/79 135/94  Pulse: 81 79  Temp:    Resp: 18 18    Last Pain:  Filed Vitals:   10/16/15 1252  PainSc: 0-No pain                 [name]

## 2015-10-17 ENCOUNTER — Encounter (HOSPITAL_BASED_OUTPATIENT_CLINIC_OR_DEPARTMENT_OTHER): Payer: Self-pay | Admitting: Orthopedic Surgery

## 2015-11-04 MED FILL — TAMOXIFEN 20 MG TABLET: 20 | 30 days supply | Qty: 30 | Fill #10

## 2015-12-04 ENCOUNTER — Other Ambulatory Visit: Payer: Self-pay | Admitting: Hematology and Oncology

## 2015-12-04 MED FILL — TAMOXIFEN 20 MG TABLET: 20 | 90 days supply | Qty: 90 | Fill #0

## 2015-12-04 NOTE — Telephone Encounter (Signed)
Chart reviewed.

## 2015-12-24 ENCOUNTER — Ambulatory Visit (INDEPENDENT_AMBULATORY_CARE_PROVIDER_SITE_OTHER): Payer: Medicaid Other | Admitting: Podiatry

## 2015-12-24 DIAGNOSIS — M79676 Pain in unspecified toe(s): Secondary | ICD-10-CM

## 2015-12-24 DIAGNOSIS — B351 Tinea unguium: Secondary | ICD-10-CM

## 2015-12-24 DIAGNOSIS — E1140 Type 2 diabetes mellitus with diabetic neuropathy, unspecified: Secondary | ICD-10-CM

## 2015-12-24 DIAGNOSIS — M79609 Pain in unspecified limb: Principal | ICD-10-CM

## 2015-12-24 DIAGNOSIS — Z794 Long term (current) use of insulin: Secondary | ICD-10-CM

## 2015-12-24 NOTE — Progress Notes (Signed)
Patient ID: Alicia Coffey, female   DOB: [birth year], 45 y.o.   MRN: [id number] Complaint:  Visit Type: Patient returns to my office for continued preventative foot care services. Complaint: Patient states" my nails have grown long and thick and become painful to walk and wear shoes" Patient has been diagnosed with DM with neuropathy. The patient presents for preventative foot care services. No changes to ROS  Podiatric Exam: Vascular: dorsalis pedis and posterior tibial pulses are palpable bilateral. Capillary return is immediate. Temperature gradient is WNL. Skin turgor WNL  Sensorium: Diminished  Semmes Weinstein monofilament test. Normal tactile sensation bilaterally. Nail Exam: Pt has thick disfigured discolored nails with subungual debris noted bilateral entire nail hallux through fifth toenails Ulcer Exam: There is no evidence of ulcer or pre-ulcerative changes or infection. Orthopedic Exam: Muscle tone and strength are WNL. No limitations in general ROM. No crepitus or effusions noted. Foot type and digits show no abnormalities. Bony prominences are unremarkable. Rigid hammer toes  3,4 B/L Skin: No Porokeratosis. No infection or ulcers  Diagnosis:  Onychomycosis, , Pain in right toe, pain in left toes  Treatment & Plan Procedures and Treatment: Consent by patient was obtained for treatment procedures. The patient understood the discussion of treatment and procedures well. All questions were answered thoroughly reviewed. Debridement of mycotic and hypertrophic toenails, 1 through 5 bilateral and clearing of subungual debris. No ulceration, no infection noted.  Return Visit-Office Procedure: Patient instructed to return to the office for a follow up visit 3 months for continued evaluation and treatment.  Gardiner Barefoot DPM

## 2016-01-08 ENCOUNTER — Ambulatory Visit
Admission: RE | Admit: 2016-01-08 | Discharge: 2016-01-08 | Disposition: A | Discharge status: Home / Self Care | Payer: Medicaid Other | Source: Ambulatory Visit

## 2016-01-08 DIAGNOSIS — Z1231 Encounter for screening mammogram for malignant neoplasm of breast: Secondary | ICD-10-CM

## 2016-01-15 ENCOUNTER — Emergency Department (HOSPITAL_COMMUNITY)
Admission: EM | Admit: 2016-01-15 | Discharge: 2016-01-15 | Disposition: A | Discharge status: Home / Self Care | Payer: Medicaid Other | Attending: Emergency Medicine | Admitting: Emergency Medicine

## 2016-01-15 ENCOUNTER — Emergency Department (HOSPITAL_COMMUNITY): Payer: Medicaid Other

## 2016-01-15 ENCOUNTER — Encounter (HOSPITAL_COMMUNITY): Payer: Self-pay

## 2016-01-15 DIAGNOSIS — R0602 Shortness of breath: Secondary | ICD-10-CM | POA: Insufficient documentation

## 2016-01-15 DIAGNOSIS — M797 Fibromyalgia: Secondary | ICD-10-CM | POA: Insufficient documentation

## 2016-01-15 DIAGNOSIS — R600 Localized edema: Secondary | ICD-10-CM | POA: Diagnosis not present

## 2016-01-15 DIAGNOSIS — E785 Hyperlipidemia, unspecified: Secondary | ICD-10-CM | POA: Insufficient documentation

## 2016-01-15 DIAGNOSIS — I10 Essential (primary) hypertension: Secondary | ICD-10-CM | POA: Insufficient documentation

## 2016-01-15 DIAGNOSIS — F329 Major depressive disorder, single episode, unspecified: Secondary | ICD-10-CM | POA: Insufficient documentation

## 2016-01-15 DIAGNOSIS — Z7984 Long term (current) use of oral hypoglycemic drugs: Secondary | ICD-10-CM | POA: Insufficient documentation

## 2016-01-15 DIAGNOSIS — R635 Abnormal weight gain: Secondary | ICD-10-CM | POA: Diagnosis not present

## 2016-01-15 DIAGNOSIS — F419 Anxiety disorder, unspecified: Secondary | ICD-10-CM | POA: Insufficient documentation

## 2016-01-15 DIAGNOSIS — Z853 Personal history of malignant neoplasm of breast: Secondary | ICD-10-CM | POA: Diagnosis not present

## 2016-01-15 DIAGNOSIS — Z7952 Long term (current) use of systemic steroids: Secondary | ICD-10-CM | POA: Insufficient documentation

## 2016-01-15 DIAGNOSIS — Z862 Personal history of diseases of the blood and blood-forming organs and certain disorders involving the immune mechanism: Secondary | ICD-10-CM | POA: Diagnosis not present

## 2016-01-15 DIAGNOSIS — Z972 Presence of dental prosthetic device (complete) (partial): Secondary | ICD-10-CM | POA: Insufficient documentation

## 2016-01-15 DIAGNOSIS — Z79899 Other long term (current) drug therapy: Secondary | ICD-10-CM | POA: Insufficient documentation

## 2016-01-15 DIAGNOSIS — Z794 Long term (current) use of insulin: Secondary | ICD-10-CM | POA: Insufficient documentation

## 2016-01-15 DIAGNOSIS — M1990 Unspecified osteoarthritis, unspecified site: Secondary | ICD-10-CM | POA: Diagnosis not present

## 2016-01-15 DIAGNOSIS — Z880 Allergy status to penicillin: Secondary | ICD-10-CM | POA: Diagnosis not present

## 2016-01-15 DIAGNOSIS — E119 Type 2 diabetes mellitus without complications: Secondary | ICD-10-CM | POA: Diagnosis not present

## 2016-01-15 LAB — BASIC METABOLIC PANEL
Anion gap: 10 (ref 5–15)
BUN: 21 mg/dL — AB (ref 6–20)
CHLORIDE: 101 mmol/L (ref 101–111)
CO2: 26 mmol/L (ref 22–32)
CREATININE: 1.24 mg/dL — AB (ref 0.44–1.00)
Calcium: 9 mg/dL (ref 8.9–10.3)
GFR calc Af Amer: 59 mL/min — ABNORMAL LOW (ref >60)
GFR calc non Af Amer: 51 mL/min — ABNORMAL LOW (ref >60)
Glucose, Bld: 286 mg/dL — ABNORMAL HIGH (ref 65–99)
POTASSIUM: 4 mmol/L (ref 3.5–5.1)
SODIUM: 137 mmol/L (ref 135–145)

## 2016-01-15 LAB — CBC WITH DIFFERENTIAL/PLATELET
Basophils Absolute: 0 10*3/uL (ref 0.0–0.1)
Basophils Relative: 0 %
EOS ABS: 0.5 10*3/uL (ref 0.0–0.7)
Eosinophils Relative: 5 %
HEMATOCRIT: 34.6 % — AB (ref 36.0–46.0)
HEMOGLOBIN: 11.5 g/dL — AB (ref 12.0–15.0)
LYMPHS ABS: 3.3 10*3/uL (ref 0.7–4.0)
LYMPHS PCT: 33 %
MCH: 28.5 pg (ref 26.0–34.0)
MCHC: 33.2 g/dL (ref 30.0–36.0)
MCV: 85.9 fL (ref 78.0–100.0)
MONOS PCT: 7 %
Monocytes Absolute: 0.7 10*3/uL (ref 0.1–1.0)
NEUTROS ABS: 5.3 10*3/uL (ref 1.7–7.7)
NEUTROS PCT: 55 %
Platelets: 174 10*3/uL (ref 150–400)
RBC: 4.03 MIL/uL (ref 3.87–5.11)
RDW: 13 % (ref 11.5–15.5)
WBC: 9.8 10*3/uL (ref 4.0–10.5)

## 2016-01-15 LAB — BRAIN NATRIURETIC PEPTIDE: B NATRIURETIC PEPTIDE 5: 8.5 pg/mL (ref 0.0–100.0)

## 2016-01-15 NOTE — ED Notes (Signed)
Patient up to restroom.

## 2016-01-15 NOTE — ED Notes (Signed)
Dr. wickline at the bedside.  

## 2016-01-15 NOTE — ED Notes (Signed)
Ice chips provided as requested.

## 2016-01-15 NOTE — ED Notes (Signed)
Provider at the bedside.  

## 2016-01-15 NOTE — ED Notes (Signed)
Patient here with increasing shortness of breath and 20lb weight gain since December, patient denies CP

## 2016-01-15 NOTE — Discharge Instructions (Signed)
Please increase your dose of Bumetadine to 2 mg by mouth twice daily. Please weigh yourself daily and keep track of any weight gain. Follow-up with your primary care doctor in 3 days for further evaluation, and return to the emergency department for worsening shortness of breath or chest pain.

## 2016-01-15 NOTE — ED Provider Notes (Signed)
CSN: [id number]     Arrival date & time 01/15/16  1817 History   First MD Initiated Contact with Patient 01/15/16 2216     Chief Complaint  Patient presents with  . Shortness of Breath     HPI  45 y.o. female with history of hypertension, hyperlipidemia, diabetes presenting with increased swelling to the bilateral lower extremities and progressively worsening shortness of breath. Reports a 20 pound weight gain since December. Patient was placed on Lasix by her primary care physician back in January, however this did not seem to be helping so she was switched to oral Bumex. Reports that over the last 3 weeks the swelling in her lower extremities has gradually been increasing, and she noticed that she has been more short of breath both at rest and with exertion. She denies any chest pain. Denies cough or fever. Continues to urinate normally. Denies nausea, vomiting, diarrhea, abdominal pain. Denies cardiac history.     Past Medical History  Diagnosis Date  . Hyperlipidemia   . Hypertension     under control, has been on med. > 1 yr.  . Diabetes mellitus     IDDM  . 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 Arthritis     right knee  . Wears dentures     upper  . Wears partial dentures     lower  . 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 Disc degeneration, lumbar   . Anemia   . Fibromyalgia   . Vertigo 2015  . Anxiety   . Jerky body movements     while awake and asleep  . Shortness of breath dyspnea     with exertion  . Depression    Past Surgical History  Procedure Laterality Date  . Breast lumpectomy with needle localization  08/15/2012    Procedure: BREAST LUMPECTOMY WITH NEEDLE LOCALIZATION;  Surgeon: Haywood Lasso, MD;  Location: [place];  Service: General;  Laterality: Right;  Wire localizations Right breast calcifications  . Breast surgery    . Refractive surgery Right     micro aneuysms  . Dilitation & currettage/hystroscopy  with thermachoice ablation N/A 08/21/2014    Procedure: DILATATION & CURETTAGE/HYSTEROSCOPY WITH THERMACHOICE ABLATION;  Surgeon: Jonnie Kind, MD;  Location: [facility];  Service: Gynecology;  Laterality: N/A;  . Polypectomy N/A 08/21/2014    Procedure: ENDOMETRIAL POLYPECTOMY;  Surgeon: Jonnie Kind, MD;  Location: [facility];  Service: Gynecology;  Laterality: N/A;  . Knee arthroscopy with medial menisectomy Right 07/17/2015    Procedure: KNEE ARTHROSCOPY WITH MEDIAL MENISECTOMY;  Surgeon: Frederik Pear, MD;  Location: [place];  Service: Orthopedics;  Laterality: Right;  . Knee arthroscopy Left 10/16/2015    Procedure: ARTHROSCOPY KNEE with debridment;  Surgeon: Frederik Pear, MD;  Location: [place];  Service: Orthopedics;  Laterality: Left;   Family History  Problem Relation Age of Onset  . Breast cancer Paternal Grandmother 46    breast; dbl mastectomy  . Diabetes Father   . Heart disease Father   . Hypertension Father   . Heart attack Father   . Diabetes Mother   . Heart disease Mother   . Hypertension Mother   . Diabetes Sister   . Fibromyalgia Sister   . Diabetes Sister   . Heart attack Maternal Grandmother     <35  . Heart attack Maternal Grandfather   . Heart attack Paternal Grandfather   . Diabetes  Brother    Social History  Substance Use Topics  . Smoking status: Never Smoker   . Smokeless tobacco: Never Used  . Alcohol Use: No   OB History    Gravida Para Term Preterm AB TAB SAB Ectopic Multiple Living   0              Review of Systems  Constitutional: Negative for fever, chills, activity change and appetite change.  HENT: Negative for congestion, rhinorrhea and sore throat.   Eyes: Negative for visual disturbance.  Respiratory: Positive for shortness of breath. Negative for cough and wheezing.   Cardiovascular: Positive for leg swelling. Negative for chest pain and palpitations.  Gastrointestinal: Negative for nausea, vomiting,  abdominal pain, diarrhea, constipation, blood in stool and abdominal distention.  Genitourinary: Negative for dysuria, frequency and flank pain.  Musculoskeletal: Negative for myalgias, back pain, joint swelling, arthralgias, gait problem, neck pain and neck stiffness.  Skin: Negative for rash.  Neurological: Negative for dizziness, tremors, syncope, facial asymmetry, speech difficulty, weakness, numbness and headaches.  Psychiatric/Behavioral: Negative for behavioral problems, confusion and agitation.      Allergies  Other; Penicillins; and Seasonal ic  Home Medications   Prior to Admission medications   Medication Sig Start Date End Date Taking? Authorizing Provider  albuterol (PROVENTIL HFA;VENTOLIN HFA) 108 (90 Base) MCG/ACT inhaler Inhale 2 puffs into the lungs every 6 (six) hours as needed for wheezing or shortness of breath.    Yes Historical Provider, MD  amitriptyline (ELAVIL) 75 MG tablet Take 75 mg by mouth at bedtime.   Yes Historical Provider, MD  benazepril (LOTENSIN) 20 MG tablet Take 20 mg by mouth daily.   Yes Historical Provider, MD  bumetanide (BUMEX) 0.5 MG tablet Take 0.5-1 mg by mouth 2 (two) times daily.   Yes Historical Provider, MD  DULoxetine (CYMBALTA) 60 MG capsule Take 1 capsule (60 mg total) by mouth daily. 12/14/13  Yes Marcial Pacas, MD  fish oil-omega-3 fatty acids 1000 MG capsule Take 1 g by mouth 2 (two) times daily.    Yes Historical Provider, MD  glipiZIDE (GLUCOTROL) 5 MG tablet Take 5 mg by mouth daily before breakfast.    Yes Historical Provider, MD  HYDROcodone-acetaminophen (NORCO) 5-325 MG tablet Take 1 tablet by mouth every 6 (six) hours as needed. 10/16/15  Yes Leighton Parody, PA-C  hydrocortisone cream 1 % Apply to affected area 2 times daily 08/12/15  Yes Kayla Rose, PA-C  hydrOXYzine (ATARAX/VISTARIL) 25 MG tablet Take 25 mg by mouth at bedtime.   Yes Historical Provider, MD  insulin glargine (LANTUS) 100 UNIT/ML injection Inject 32 Units into the  skin at bedtime.    Yes Historical Provider, MD  Insulin Lispro Prot & Lispro (HUMALOG MIX 50/50 KWIKPEN) (50-50) 100 UNIT/ML Kwikpen Inject 3-10 Units into the skin 3 (three) times daily with meals. Sliding scale   Yes Historical Provider, MD  Iron-FA-B Cmp-C-Biot-Probiotic (FUSION PLUS) CAPS Take 1 capsule by mouth daily.    Yes Historical Provider, MD  Melatonin 10 MG TABS Take 10 mg by mouth at bedtime.   Yes Historical Provider, MD  methocarbamol (ROBAXIN) 500 MG tablet Take 500 mg by mouth 2 (two) times daily.    Yes Historical Provider, MD  metoprolol (LOPRESSOR) 50 MG tablet Take 25 mg by mouth 2 (two) times daily.    Yes Historical Provider, MD  Probiotic Product (TRUBIOTICS) CAPS Take 1 capsule by mouth at bedtime.    Yes Historical Provider, MD  rosuvastatin (CRESTOR)  40 MG tablet Take 40 mg by mouth daily.   Yes Historical Provider, MD  sitaGLIPtin-metformin (JANUMET) 50-1000 MG tablet Take 1 tablet by mouth 2 (two) times daily with a meal.   Yes Historical Provider, MD  tamoxifen (NOLVADEX) 20 MG tablet TAKE 1 TABLET BY MOUTH ONCE DAILY 12/04/15  Yes Nicholas Lose, MD  traMADol (ULTRAM) 50 MG tablet Take 50 mg by mouth every 6 (six) hours as needed for moderate pain.    Yes Historical Provider, MD   BP 131/85 mmHg  Pulse 79  Temp(Src) 98.2 F (36.8 C) (Oral)  Resp 16  Ht 5\' 3"  (1.6 m)  Wt 117.935 kg  BMI 46.07 kg/m2  SpO2 100% Physical Exam  Constitutional: She is oriented to person, place, and time. She appears well-developed and well-nourished. No distress.  HENT:  Head: Normocephalic and atraumatic.  Right Ear: External ear normal.  Left Ear: External ear normal.  Nose: Nose normal.  Mouth/Throat: Oropharynx is clear and moist. No oropharyngeal exudate.  Eyes: Conjunctivae and EOM are normal. Pupils are equal, round, and reactive to light. Right eye exhibits no discharge. Left eye exhibits no discharge.  Neck: Normal range of motion. Neck supple.  Cardiovascular: Normal  rate, regular rhythm and normal heart sounds.  Exam reveals no gallop and no friction rub.   No murmur heard. Pulmonary/Chest: Breath sounds normal. No respiratory distress. She has no wheezes. She has no rales.  Abdominal: Soft. Bowel sounds are normal. She exhibits no distension and no mass. There is no tenderness. There is no rebound and no guarding.  Musculoskeletal: Normal range of motion. She exhibits edema (2+ pitting edema to the bilateral lower extremities.). She exhibits no tenderness.  Neurological: She is alert and oriented to person, place, and time. She exhibits normal muscle tone.  Skin: Skin is warm and dry. No rash noted. She is not diaphoretic.  Psychiatric: She has a normal mood and affect. Her behavior is normal. Judgment and thought content normal.    ED Course  Procedures (including critical care time) Labs Review Labs Reviewed  CBC WITH DIFFERENTIAL/PLATELET - Abnormal; Notable for the following:    Hemoglobin 11.5 (*)    HCT 34.6 (*)    All other components within normal limits  BASIC METABOLIC PANEL - Abnormal; Notable for the following:    Glucose, Bld 286 (*)    BUN 21 (*)    Creatinine, Ser 1.24 (*)    GFR calc non Af Amer 51 (*)    GFR calc Af Amer 59 (*)    All other components within normal limits  BRAIN NATRIURETIC PEPTIDE    Imaging Review Dg Chest 2 View  01/15/2016  CLINICAL DATA:  Increasing dyspnea. 20 pound weight gain since December. EXAM: CHEST  2 VIEW COMPARISON:  None. FINDINGS: There is elevation of the right hemidiaphragm. The lungs are clear. There is no pleural effusion. The pulmonary vasculature is normal. Heart size is normal. Hilar and mediastinal contours are unremarkable. IMPRESSION: No active cardiopulmonary disease. Electronically Signed   By: Andreas Newport M.D.   On: 01/15/2016 19:46   I have personally reviewed and evaluated these images and lab results as part of my medical decision-making.   EKG  Interpretation   Date/Time:  Wednesday January 15 2016 18:27:39 EDT Ventricular Rate:  81 PR Interval:  156 QRS Duration: 90 QT Interval:  384 QTC Calculation: 446 R Axis:   -19 Text Interpretation:  Normal sinus rhythm Possible Anterolateral infarct ,  age undetermined  Abnormal ECG Confirmed by Christy Gentles  MD, Elenore Rota ([id number]) on  01/15/2016 10:31:02 PM      MDM   Final diagnoses:  Shortness of breath   Patient is well-appearing. EKG with no ischemic changes and patient denies chest pain. Lungs clear to auscultation bilaterally. Chest x-ray without evidence of pulmonary edema and BNP is within normal limits. Creatinine is elevated to 1.24 but at the patient's baseline. CBC unremarkable. Patient reports shortness of breath but appears comfortable, is breathing normally, and is not dyspneic both at rest and with ambulation. She is PERC negative. Doubt PE, decompensated CHF, coronary artery disease, or pneumonia as the cause of her symptoms. No indication for hospital admission at this time. Recommend increasing dose of home Bumex from 1 mg twice daily to 2 mg twice daily until follow-up with her primary care physician in 3 days. Also recommend that the patient weigh herself daily and record any weight gain. She should return to the emergency department immediately for any chest pain or worsening symptoms. She expressed agreement and understanding with this plan and will follow up as appropriate.    Mariadelaluz [facility] Algernon Huxley, MD 01/16/16 Laureen Abrahams  Ripley Fraise, MD 01/16/16 [phone]

## 2016-03-10 MED FILL — TAMOXIFEN 20 MG TABLET: 20 | 90 days supply | Qty: 90 | Fill #1

## 2016-03-17 ENCOUNTER — Encounter: Payer: Medicaid Other | Attending: Cardiology | Admitting: Dietician

## 2016-03-17 ENCOUNTER — Encounter: Payer: Self-pay | Admitting: Dietician

## 2016-03-17 VITALS — Ht 63.0 in | Wt 244.0 lb

## 2016-03-17 DIAGNOSIS — Z794 Long term (current) use of insulin: Secondary | ICD-10-CM

## 2016-03-17 DIAGNOSIS — I10 Essential (primary) hypertension: Secondary | ICD-10-CM | POA: Insufficient documentation

## 2016-03-17 DIAGNOSIS — D649 Anemia, unspecified: Secondary | ICD-10-CM | POA: Diagnosis not present

## 2016-03-17 DIAGNOSIS — M797 Fibromyalgia: Secondary | ICD-10-CM | POA: Diagnosis not present

## 2016-03-17 DIAGNOSIS — E7800 Pure hypercholesterolemia, unspecified: Secondary | ICD-10-CM | POA: Insufficient documentation

## 2016-03-17 DIAGNOSIS — E118 Type 2 diabetes mellitus with unspecified complications: Secondary | ICD-10-CM

## 2016-03-17 DIAGNOSIS — E119 Type 2 diabetes mellitus without complications: Secondary | ICD-10-CM | POA: Diagnosis present

## 2016-03-17 DIAGNOSIS — IMO0002 Reserved for concepts with insufficient information to code with codable children: Secondary | ICD-10-CM

## 2016-03-17 DIAGNOSIS — E1165 Type 2 diabetes mellitus with hyperglycemia: Secondary | ICD-10-CM

## 2016-03-17 NOTE — Progress Notes (Signed)
Medical Nutrition Therapy:  Appt start time: 1400 end time:  [id number].   Assessment:  Primary concerns today: Patient is here alone.  She would like to learn a better meal plan with more variety and healthier as well as how to incorporate fruit in the diet and still control her blood sugar.   She would also like to learn more about beverage and snack options.  She does not use salt and uses Mrs. Dash, garlic and onion powder.  She usually bakes, uses brown rice, increased vegetables.  The referral is for type 2 diabetes, HTN, hypercholesterolemia, extreme obesity.  Hx also includes anemia and fibromyalgia.  She states that her blood pressure is low at times.  Her vitamin D was 32 02/29/16.  HgbA1C 7.8% February 2017 decreased from 10.6%.    Weight hx: Gained increased amounts of weight at age 45 yo with depression.  Increased sleep and eating. Lowest adult weight:  >200 lbs Highest adult weight 267 lbs but increased fluid retention at that time. Today's weight 244 lbs.  Patient lives with her mother.  Both shop and mom cooks more.  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   She states that mother, God, and church are great support.  Preferred Learning Style:   No preference indicated   Learning Readiness:   Ready  Change in progress   MEDICATIONS: see list to include Janumet bid, glipizide, Lantus 20 units q HS and Humalog 50/50 3-5 units with meals.  She reports taking her medications as prescribed but sleep schedule is very disordered so medication is not taken at the same time each day.  (Some days she may sleep until 3 pm.)   DIETARY INTAKE:  Usual eating pattern includes 3 meals and 1-2 snacks per day.  Everyday foods include chicken.  Avoided foods include salt, sweet tea..    24-hr recall:  B ( AM): cereal and milk or grits, eggs or sandwich ([place] bologna or salami on [employer] bread with mustard with or without cheese)  Snk ( AM): none  L (  PM): chicken wings, lite ranch dressing and [employer] bread Snk ( PM): none or applesauce or SF jello OR rare sandwich D ( PM): spaghetti, with sauce, vegetables, ground [place] Snk ( PM): none OR applesauce OR SF jello, rare ice cream Beverages: water, lavored water, diet soda (usually avoids dark soda), 1 cup coffee per week sweetened creamer, 2 T honey, lemon juice, 2 T vinegar and 8 ounce vinegar nightly before bed.  Usual physical activity: armchair exercises  Estimated energy needs: 1500 calories 170 g carbohydrates 112 g protein 42 g fat  Progress Towards Goal(s):  In progress.   Nutritional Diagnosis:  NB-1.1 Food and nutrition-related knowledge deficit As related to balance of carbohydrates, protein, and fat as well as sodium.  As evidenced by patient report and diet hx.    Intervention:  Nutrition counseling and diabetes education initiated. Discussed Carb Counting by food group as method of portion control, reading food labels, and benefits of increased activity. Also discussed basic physiology of Diabetes, target BG ranges pre and post meals, and A1c.  Discussed these things are related to hypercholesterolemia, HTN, and weight loss as well.  Discussed label reading as related to sodium and carbohydrates and discussed foods preferable to use that are lower in sodium.  Discussed basic meal planning and sources for healthy recipes.  Taught the "rule of 15" for hypoglycemia.  She states  that she checks her blood sugar bid.  It is 120-150 prior to breakfast most days recently.  Slow, gentle exercise (such as armchair exercises or walking) several days of the week.  (See about a scholarship at the [facility] and consider Fibromyalgia water class.) Aim for a regular sleep schedule.  Turn off electronic devises 2 hours prior to bed.  This may help with a routine to help take medication more on a schedule. Do a trial of 2 weeks without diet soda.  How do you feel?  Aim for 2-3 Carb Choices per meal  (30-45 grams) +/- 1 either way  Aim for 0-1 Carbs per snack if hungry  Include protein in moderation with your meals and snacks Consider reading food labels for Total Carbohydrate and Fat Grams of foods Continue checking BG at alternate times per day as directed by MD  Continue taking medication as directed by MD  Teaching Method Utilized:  Visual Auditory Hands on  Handouts given during visit include: Living Well with Diabetes Carb Counting and Food Label handouts Meal Plan Card Label reading My plate  [id number] sheet  Hypoglycemia- rule of 15  Barriers to learning/adherence to lifestyle change: increased pain, depression  Demonstrated degree of understanding via:  Teach Back   Monitoring/Evaluation:  Dietary intake, exercise, label reading, and body weight prn.

## 2016-03-17 NOTE — Patient Instructions (Addendum)
Slow, gentle exercise (such as armchair exercises or walking) several days of the week.  (See about a scholarship at the [facility] and consider Fibromyalgia water class.) Aim for a regular sleep schedule.  Turn off electronic devises 2 hours prior to bed.  This may help with a routine to help take medication more on a schedule. Do a trial of 2 weeks without diet soda.  How do you feel?  Aim for 2-3 Carb Choices per meal (30-45 grams) +/- 1 either way  Aim for 0-1 Carbs per snack if hungry  Include protein in moderation with your meals and snacks Consider reading food labels for Total Carbohydrate and Fat Grams of foods Continue checking BG at alternate times per day as directed by MD  Continue taking medication as directed by MD

## 2016-03-25 ENCOUNTER — Encounter (HOSPITAL_COMMUNITY): Payer: Self-pay | Admitting: Nurse Practitioner

## 2016-03-25 ENCOUNTER — Emergency Department (HOSPITAL_COMMUNITY)
Admission: EM | Admit: 2016-03-25 | Discharge: 2016-03-25 | Disposition: A | Discharge status: Home / Self Care | Payer: Medicaid Other | Attending: Emergency Medicine | Admitting: Emergency Medicine

## 2016-03-25 DIAGNOSIS — Z7901 Long term (current) use of anticoagulants: Secondary | ICD-10-CM | POA: Diagnosis not present

## 2016-03-25 DIAGNOSIS — Z79899 Other long term (current) drug therapy: Secondary | ICD-10-CM | POA: Insufficient documentation

## 2016-03-25 DIAGNOSIS — Z853 Personal history of malignant neoplasm of breast: Secondary | ICD-10-CM | POA: Insufficient documentation

## 2016-03-25 DIAGNOSIS — J029 Acute pharyngitis, unspecified: Secondary | ICD-10-CM | POA: Diagnosis not present

## 2016-03-25 DIAGNOSIS — R42 Dizziness and giddiness: Secondary | ICD-10-CM | POA: Insufficient documentation

## 2016-03-25 DIAGNOSIS — E119 Type 2 diabetes mellitus without complications: Secondary | ICD-10-CM | POA: Insufficient documentation

## 2016-03-25 DIAGNOSIS — E785 Hyperlipidemia, unspecified: Secondary | ICD-10-CM | POA: Insufficient documentation

## 2016-03-25 DIAGNOSIS — I10 Essential (primary) hypertension: Secondary | ICD-10-CM | POA: Insufficient documentation

## 2016-03-25 LAB — I-STAT CHEM 8, ED
BUN: 21 mg/dL — ABNORMAL HIGH (ref 6–20)
CALCIUM ION: 1.17 mmol/L (ref 1.12–1.23)
CHLORIDE: 98 mmol/L — AB (ref 101–111)
CREATININE: 1.2 mg/dL — AB (ref 0.44–1.00)
GLUCOSE: 179 mg/dL — AB (ref 65–99)
HCT: 37 % (ref 36.0–46.0)
Hemoglobin: 12.6 g/dL (ref 12.0–15.0)
POTASSIUM: 3.9 mmol/L (ref 3.5–5.1)
Sodium: 141 mmol/L (ref 135–145)
TCO2: 28 mmol/L (ref 0–100)

## 2016-03-25 LAB — RAPID STREP SCREEN (MED CTR MEBANE ONLY): Streptococcus, Group A Screen (Direct): NEGATIVE

## 2016-03-25 MED ORDER — OXYMETAZOLINE HCL 0.05 % NA SOLN: 1.0000 | Freq: Two times a day (BID) | NASAL | Status: DC

## 2016-03-25 MED ORDER — HYDROCODONE-ACETAMINOPHEN 7.5-325 MG/15ML PO SOLN
10.0000 mL | Freq: Once | ORAL | Status: AC
  Administered 2016-03-25: 10 mL via ORAL
  Filled 2016-03-25: qty 15

## 2016-03-25 MED ORDER — HYDROCODONE-ACETAMINOPHEN 7.5-325 MG/15ML PO SOLN
15.0000 mL | Freq: Three times a day (TID) | ORAL | Status: DC | PRN
Start: 2016-03-25 — End: 2017-06-10

## 2016-03-25 NOTE — ED Provider Notes (Signed)
CSN: [id number]     Arrival date & time 03/25/16  1138 History  By signing my name below, I, Randa Evens, attest that this documentation has been prepared under the direction and in the presence of [employer], [place]. Electronically Signed: Randa Evens, ED Scribe. 03/25/2016. 2:03 PM.     Chief Complaint  Patient presents with  . Sore Throat  . Hypertension   Patient is a 45 y.o. female presenting with pharyngitis and hypertension. The history is provided by the patient. No language interpreter was used.  Sore Throat Associated symptoms include headaches. Pertinent negatives include no chest pain and no shortness of breath.  Hypertension Associated symptoms include headaches. Pertinent negatives include no chest pain and no shortness of breath.   HPI Comments: Alicia Coffey is a 45 y.o. female who presents to the Emergency Department complaining of sore throat, onset this morning. Pt states that her sore throat is worse when swallowing. Pt also reports that she has a mild left sided HA. She also reports having worsening intermittent light headedness that began 1 year prior and has recently worsened over the last 3 days.  Pt reports that her BP has been fluctuating a lot recently which she attributes to her lightheadedness but notes she is followed by her PCP. She states she has been taking her BP meds as prescribed. Pt reports that she is scheduled to follow up with her PCP in 3 weeks. Pt denies any medications PTA. Denies recent sick contacts. Denies fever, visual disturbance, neck stiffness, congestion, ear pain, facial/neck swelling, cough , SOB, wheezing, CP, nausea, vomiting, abdominal pain, trouble swallowing, numbness, tingling, weakness, syncope, seizure.   Past Medical History  Diagnosis Date  . Hyperlipidemia   . Hypertension     under control, has been on med. > 1 yr.  . Diabetes mellitus     IDDM  . 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 Arthritis      right knee  . Wears dentures     upper  . Wears partial dentures     lower  . 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 Disc degeneration, lumbar   . Anemia   . Fibromyalgia   . Vertigo 2015  . Anxiety   . Jerky body movements     while awake and asleep  . Shortness of breath dyspnea     with exertion  . Depression    Past Surgical History  Procedure Laterality Date  . Breast lumpectomy with needle localization  08/15/2012    Procedure: BREAST LUMPECTOMY WITH NEEDLE LOCALIZATION;  Surgeon: Haywood Lasso, MD;  Location: [place];  Service: General;  Laterality: Right;  Wire localizations Right breast calcifications  . Breast surgery    . Refractive surgery Right     micro aneuysms  . Dilitation & currettage/hystroscopy with thermachoice ablation N/A 08/21/2014    Procedure: DILATATION & CURETTAGE/HYSTEROSCOPY WITH THERMACHOICE ABLATION;  Surgeon: Jonnie Kind, MD;  Location: [facility];  Service: Gynecology;  Laterality: N/A;  . Polypectomy N/A 08/21/2014    Procedure: ENDOMETRIAL POLYPECTOMY;  Surgeon: Jonnie Kind, MD;  Location: [facility];  Service: Gynecology;  Laterality: N/A;  . Knee arthroscopy with medial menisectomy Right 07/17/2015    Procedure: KNEE ARTHROSCOPY WITH MEDIAL MENISECTOMY;  Surgeon: Frederik Pear, MD;  Location: [place];  Service: Orthopedics;  Laterality: Right;  . Knee arthroscopy Left 10/16/2015    Procedure: ARTHROSCOPY KNEE with debridment;  Surgeon: Frederik Pear, MD;  Location: [facility];  Service: Orthopedics;  Laterality: Left;   Family History  Problem Relation Age of Onset  . Breast cancer Paternal Grandmother 49    breast; dbl mastectomy  . Diabetes Father   . Heart disease Father   . Hypertension Father   . Heart attack Father   . Diabetes Mother   . Heart disease Mother   . Hypertension Mother   . Diabetes Sister   . Fibromyalgia Sister   . Diabetes Sister   . Heart attack  Maternal Grandmother     <35  . Heart attack Maternal Grandfather   . Heart attack Paternal Grandfather   . Diabetes Brother    Social History  Substance Use Topics  . Smoking status: Never Smoker   . Smokeless tobacco: Never Used  . Alcohol Use: No   OB History    Gravida Para Term Preterm AB TAB SAB Ectopic Multiple Living   0              Review of Systems  Constitutional: Negative for fever.  HENT: Positive for sore throat. Negative for congestion, facial swelling and trouble swallowing.   Eyes: Negative for visual disturbance.  Respiratory: Negative for cough, shortness of breath and wheezing.   Cardiovascular: Negative for chest pain.  Gastrointestinal: Negative for nausea and vomiting.  Neurological: Positive for light-headedness and headaches.      Allergies  Other; Penicillins; and Seasonal ic  Home Medications   Prior to Admission medications   Medication Sig Start Date End Date Taking? Authorizing Provider  albuterol (PROVENTIL HFA;VENTOLIN HFA) 108 (90 Base) MCG/ACT inhaler Inhale 2 puffs into the lungs every 6 (six) hours as needed for wheezing or shortness of breath.     Historical Provider, MD  amitriptyline (ELAVIL) 75 MG tablet Take 75 mg by mouth at bedtime.    Historical Provider, MD  benazepril (LOTENSIN) 20 MG tablet Take 10 mg by mouth daily.     Historical Provider, MD  bumetanide (BUMEX) 0.5 MG tablet Take 0.5-1 mg by mouth 2 (two) times daily.    Historical Provider, MD  docusate sodium (COLACE) 100 MG capsule Take 100 mg by mouth daily.    Historical Provider, MD  DULoxetine (CYMBALTA) 60 MG capsule Take 1 capsule (60 mg total) by mouth daily. 12/14/13   Marcial Pacas, MD  fish oil-omega-3 fatty acids 1000 MG capsule Take 1 g by mouth 2 (two) times daily.     Historical Provider, MD  glipiZIDE (GLUCOTROL) 5 MG tablet Take 5 mg by mouth daily before breakfast.     Historical Provider, MD  HYDROcodone-acetaminophen (HYCET) 7.5-325 mg/15 ml solution Take  15 mLs by mouth every 8 (eight) hours as needed for moderate pain. 03/25/16   Nona Dell, PA-C  hydrocortisone cream 1 % Apply to affected area 2 times daily 08/12/15   Gloriann Loan, PA-C  hydrOXYzine (ATARAX/VISTARIL) 25 MG tablet Take 25 mg by mouth at bedtime.    Historical Provider, MD  insulin glargine (LANTUS) 100 UNIT/ML injection Inject 32 Units into the skin at bedtime.     Historical Provider, MD  Insulin Lispro Prot & Lispro (HUMALOG MIX 50/50 KWIKPEN) (50-50) 100 UNIT/ML Kwikpen Inject 3-10 Units into the skin 3 (three) times daily with meals. Sliding scale    Historical Provider, MD  Iron-FA-B Cmp-C-Biot-Probiotic (FUSION PLUS) CAPS Take 1 capsule by mouth daily.     Historical Provider, MD  magnesium 30 MG tablet Take  250 mg by mouth 2 (two) times daily.    Historical Provider, MD  Melatonin 10 MG TABS Take 10 mg by mouth at bedtime.    Historical Provider, MD  methocarbamol (ROBAXIN) 500 MG tablet Take 500 mg by mouth 2 (two) times daily.     Historical Provider, MD  metoprolol (LOPRESSOR) 50 MG tablet Take 12.5 mg by mouth 2 (two) times daily.     Historical Provider, MD  oxymetazoline (AFRIN NASAL SPRAY) 0.05 % nasal spray Place 1 spray into both nostrils 2 (two) times daily. 03/25/16   Nona Dell, PA-C  Probiotic Product (TRUBIOTICS) CAPS Take 1 capsule by mouth at bedtime.     Historical Provider, MD  rosuvastatin (CRESTOR) 40 MG tablet Take 40 mg by mouth daily.    Historical Provider, MD  sitaGLIPtin-metformin (JANUMET) 50-1000 MG tablet Take 1 tablet by mouth 2 (two) times daily with a meal.    Historical Provider, MD  tamoxifen (NOLVADEX) 20 MG tablet TAKE 1 TABLET BY MOUTH ONCE DAILY 12/04/15   Nicholas Lose, MD  traMADol (ULTRAM) 50 MG tablet Take 50 mg by mouth every 6 (six) hours as needed for moderate pain.     Historical Provider, MD   BP 119/75 mmHg  Pulse 108  Temp(Src) 99.9 F (37.7 C) (Oral)  Resp 16  SpO2 99%    Physical Exam   Constitutional: She is oriented to person, place, and time. She appears well-developed and well-nourished.  HENT:  Head: Normocephalic and atraumatic.  Mouth/Throat: Uvula is midline and mucous membranes are normal. No trismus in the jaw. Posterior oropharyngeal erythema present. No oropharyngeal exudate, posterior oropharyngeal edema or tonsillar abscesses.  Eyes: Conjunctivae and EOM are normal. Pupils are equal, round, and reactive to light. Right eye exhibits no discharge. Left eye exhibits no discharge. No scleral icterus.  No nystagmus  Neck: Normal range of motion. Neck supple.  Cardiovascular: Regular rhythm, normal heart sounds and intact distal pulses.   HR 108.   Pulmonary/Chest: Effort normal and breath sounds normal. No respiratory distress. She has no wheezes. She has no rales. She exhibits no tenderness.  Abdominal: Soft. Bowel sounds are normal. She exhibits no distension and no mass. There is no tenderness. There is no rebound and no guarding.  Musculoskeletal: Normal range of motion. She exhibits no edema.  Lymphadenopathy:    She has no cervical adenopathy.  Neurological: She is alert and oriented to person, place, and time. She has normal strength. No cranial nerve deficit or sensory deficit. Coordination and gait (no ataxia noted) normal.  Skin: Skin is warm and dry.  Nursing note and vitals reviewed.   ED Course  Procedures (including critical care time) DIAGNOSTIC STUDIES: Oxygen Saturation is 99% on RA, normal by my interpretation.    COORDINATION OF CARE: 2:01 PM-Discussed treatment plan with pt at bedside and pt agreed to plan.     Labs Review Labs Reviewed  I-STAT CHEM 8, ED - Abnormal; Notable for the following:    Chloride 98 (*)    BUN 21 (*)    Creatinine, Ser 1.20 (*)    Glucose, Bld 179 (*)    All other components within normal limits  RAPID STREP SCREEN (NOT AT [facility])  CULTURE, GROUP A STREP [facility])    Imaging Review No results found.     EKG Interpretation None      MDM   Final diagnoses:  Pharyngitis  Lightheadedness    Patient presents with sore throat and chronic lightheadedness  which she notes has worsened over the past few days. Denies fever. Temp 99.9, HR 108, remaining vitals stable. Exam revealed mild posterior oropharyngeal erythema, remaining exam unremarkable. No neuro deficits. Patient able to stand and ambulate without assistance, no ataxia noted. Rapid strep negative. Basic labs ordered for further evaluation. Negative orthostatics. Labs consistent with patient's baseline values. I suspect patient's mildly elevated HR is likely due to mild discomfort from sore throat and mild dehydration. Pt able to tolerate PO in the ED. Pt denies CP, SOB. BP remained stable at 119/75 while in the ED. S/p oral fluids, HR decreased to 102. I feel pt is hemodynamically stable and appropriate to be discharged home with close PCP follow up. Discussed results and plan for discharge with patient. Plan to discharge patient home with symptomatic treatment including pain meds and decongestion. Discussed strict return precautions with pt.   I personally performed the services described in this documentation, which was scribed in my presence. The recorded information has been reviewed and is accurate.      Chesley [address]on [address], [place] 03/26/16 1106  Sherwood Gambler, MD 03/31/16 [phone]

## 2016-03-25 NOTE — Discharge Instructions (Signed)
Take your medications as prescribed. You may also take Tylenol and/or ibuprofen as prescribed over-the-counter as and for pain relief. I recommend drinking at least six 8 ounce glasses of water daily to remain hydrated at home. Continue taking your home blood pressure medications as prescribed. Follow-up with your primary care provider in 2 days if your symptoms have not improved, otherwise I recommend following up within the next week for recheck of your blood pressure. Return to the emergency department if symptoms worsen or new onset of fever, headache, visual changes, dizziness, unable to ambulate due to lightheaded/dizziness, chest pain, shortness of breath, vomiting, abdominal pain, unable to keep fluids down.

## 2016-03-25 NOTE — ED Notes (Signed)
She c/o waking this morning with a sore throat. She denies congestion, cough. She reports fevers. She also c/o several week history of fluctuations in BP, she reports she takes her BP pills as prescribed and has been to her PCP for this complaint but she continues to have fluctuations and it makes her dizzy at times. She is alert and breathing easily

## 2016-03-25 NOTE — ED Notes (Signed)
Pt is in stable condition upon d/c and ambulates from ED. 

## 2016-03-27 LAB — CULTURE, GROUP A STREP (THRC)

## 2016-03-31 ENCOUNTER — Encounter: Payer: Self-pay | Admitting: Podiatry

## 2016-03-31 ENCOUNTER — Ambulatory Visit (INDEPENDENT_AMBULATORY_CARE_PROVIDER_SITE_OTHER): Payer: Medicaid Other | Admitting: Podiatry

## 2016-03-31 DIAGNOSIS — M79676 Pain in unspecified toe(s): Secondary | ICD-10-CM | POA: Diagnosis not present

## 2016-03-31 DIAGNOSIS — Z794 Long term (current) use of insulin: Secondary | ICD-10-CM

## 2016-03-31 DIAGNOSIS — B351 Tinea unguium: Secondary | ICD-10-CM | POA: Diagnosis not present

## 2016-03-31 DIAGNOSIS — L84 Corns and callosities: Secondary | ICD-10-CM | POA: Diagnosis not present

## 2016-03-31 DIAGNOSIS — E1140 Type 2 diabetes mellitus with diabetic neuropathy, unspecified: Secondary | ICD-10-CM

## 2016-03-31 DIAGNOSIS — M79609 Pain in unspecified limb: Principal | ICD-10-CM

## 2016-03-31 NOTE — Progress Notes (Signed)
Patient ID: Alicia Coffey, female   DOB: 13-Jan-1971, 45 y.o.   MRN: [id number] Complaint:  Visit Type: Patient returns to my office for continued preventative foot care services. Complaint: Patient states" my nails have grown long and thick and become painful to walk and wear shoes" Patient has been diagnosed with DM with neuropathy. The patient presents for preventative foot care services. No changes to ROS.  Patient has developed painful area on tip of second toe left foot.  Podiatric Exam: Vascular: dorsalis pedis and posterior tibial pulses are palpable bilateral. Capillary return is immediate. Temperature gradient is WNL. Skin turgor WNL  Sensorium: Diminished  Semmes Weinstein monofilament test. Normal tactile sensation bilaterally. Nail Exam: Pt has thick disfigured discolored nails with subungual debris noted bilateral entire nail hallux through fifth toenails Ulcer Exam: There is no evidence of ulcer or pre-ulcerative changes or infection. Orthopedic Exam: Muscle tone and strength are WNL. No limitations in general ROM. No crepitus or effusions noted. Foot type and digits show no abnormalities. Bony prominences are unremarkable. Rigid hammer toes  3,4 B/L Skin: No Porokeratosis. No infection or ulcers.  Clavi second toe left foot.  Diagnosis:  Onychomycosis, , Pain in right toe, pain in left toes  Treatment & Plan Procedures and Treatment: Consent by patient was obtained for treatment procedures. The patient understood the discussion of treatment and procedures well. All questions were answered thoroughly reviewed. Debridement of mycotic and hypertrophic toenails, 1 through 5 bilateral and clearing of subungual debris. No ulceration, no infection noted. Debride clavi Return Visit-Office Procedure: Patient instructed to return to the office for a follow up visit 3 months for continued evaluation and treatment.  Gardiner Barefoot DPM

## 2016-04-02 ENCOUNTER — Encounter: Payer: Self-pay | Admitting: Cardiology

## 2016-05-24 ENCOUNTER — Encounter (HOSPITAL_COMMUNITY): Payer: Self-pay | Admitting: Emergency Medicine

## 2016-05-24 ENCOUNTER — Emergency Department (HOSPITAL_COMMUNITY)
Admission: EM | Admit: 2016-05-24 | Discharge: 2016-05-24 | Disposition: A | Discharge status: Home / Self Care | Payer: Medicaid Other | Attending: Emergency Medicine | Admitting: Emergency Medicine

## 2016-05-24 DIAGNOSIS — I10 Essential (primary) hypertension: Secondary | ICD-10-CM | POA: Insufficient documentation

## 2016-05-24 DIAGNOSIS — N289 Disorder of kidney and ureter, unspecified: Secondary | ICD-10-CM | POA: Diagnosis not present

## 2016-05-24 DIAGNOSIS — Z853 Personal history of malignant neoplasm of breast: Secondary | ICD-10-CM | POA: Insufficient documentation

## 2016-05-24 DIAGNOSIS — Z7984 Long term (current) use of oral hypoglycemic drugs: Secondary | ICD-10-CM | POA: Insufficient documentation

## 2016-05-24 DIAGNOSIS — R42 Dizziness and giddiness: Secondary | ICD-10-CM

## 2016-05-24 DIAGNOSIS — E1142 Type 2 diabetes mellitus with diabetic polyneuropathy: Secondary | ICD-10-CM | POA: Insufficient documentation

## 2016-05-24 DIAGNOSIS — E1165 Type 2 diabetes mellitus with hyperglycemia: Secondary | ICD-10-CM | POA: Insufficient documentation

## 2016-05-24 DIAGNOSIS — R739 Hyperglycemia, unspecified: Secondary | ICD-10-CM

## 2016-05-24 DIAGNOSIS — Z79899 Other long term (current) drug therapy: Secondary | ICD-10-CM | POA: Insufficient documentation

## 2016-05-24 DIAGNOSIS — Z794 Long term (current) use of insulin: Secondary | ICD-10-CM | POA: Insufficient documentation

## 2016-05-24 LAB — CBC
HEMATOCRIT: 36.6 % (ref 36.0–46.0)
Hemoglobin: 11.7 g/dL — ABNORMAL LOW (ref 12.0–15.0)
MCH: 27.9 pg (ref 26.0–34.0)
MCHC: 32 g/dL (ref 30.0–36.0)
MCV: 87.1 fL (ref 78.0–100.0)
Platelets: 189 10*3/uL (ref 150–400)
RBC: 4.2 MIL/uL (ref 3.87–5.11)
RDW: 13.9 % (ref 11.5–15.5)
WBC: 11.2 10*3/uL — ABNORMAL HIGH (ref 4.0–10.5)

## 2016-05-24 LAB — URINALYSIS, ROUTINE W REFLEX MICROSCOPIC
BILIRUBIN URINE: NEGATIVE
Glucose, UA: NEGATIVE mg/dL
HGB URINE DIPSTICK: NEGATIVE
KETONES UR: NEGATIVE mg/dL
NITRITE: NEGATIVE
PH: 5 (ref 5.0–8.0)
Protein, ur: NEGATIVE mg/dL
SPECIFIC GRAVITY, URINE: 1.017 (ref 1.005–1.030)

## 2016-05-24 LAB — BASIC METABOLIC PANEL
Anion gap: 9 (ref 5–15)
BUN: 26 mg/dL — AB (ref 6–20)
CALCIUM: 8.6 mg/dL — AB (ref 8.9–10.3)
CO2: 27 mmol/L (ref 22–32)
Chloride: 101 mmol/L (ref 101–111)
Creatinine, Ser: 1.37 mg/dL — ABNORMAL HIGH (ref 0.44–1.00)
GFR calc Af Amer: 52 mL/min — ABNORMAL LOW (ref >60)
GFR, EST NON AFRICAN AMERICAN: 45 mL/min — AB (ref >60)
GLUCOSE: 220 mg/dL — AB (ref 65–99)
POTASSIUM: 4 mmol/L (ref 3.5–5.1)
Sodium: 137 mmol/L (ref 135–145)

## 2016-05-24 LAB — URINE MICROSCOPIC-ADD ON: RBC / HPF: NONE SEEN RBC/hpf (ref 0–5)

## 2016-05-24 NOTE — ED Triage Notes (Addendum)
Dizziness for last three days. Feels like the room is spinning when she opens her eyes. Reports her body feels heavy and has generalized weakness all over. Face symmetrical, extremity strength equal, speech intact. Denies pain.

## 2016-05-24 NOTE — ED Provider Notes (Signed)
[facility] DEPT Provider Note   CSN: [id number] Arrival date & time: 05/24/16  1427  First Provider Contact:  First MD Initiated Contact with Patient 05/24/16 1602        History   Chief Complaint Chief Complaint  Patient presents with  . Dizziness    HPI Alicia Coffey is a 45 y.o. female.  The patient is a 45 year old female, she has a known history of diabetes as well as a history that she reports of chronic fatigue and fibromyalgia. Over the last several days she has some intermittent vague symptoms including a feeling of intermittent vertigo as well as a feeling of a mild headache across her head. She does not have any tinnitus, she has not lost any hearing. She does report that several years ago when she was blowing her nose she developed acute onset of severe vertigo which has been intermittent since that time. She was at church today when she felt slightly more fatigued than usual, she had some shortness of breath when doing normal activities but that has all resolved at this time the patient has no symptoms whatsoever. She states that she just does not feel like herself. Specifically she denies chest pain, palpitations, nausea, vomiting, diarrhea and has had a normal breakfast this morning eating a biscuit when she got to church.  Because of her history of neuropathy and recent numbness in her bilateral arms she was given a course of prednisone which helped with the numbness. She has now finished that medication.      Past Medical History:  Diagnosis Date  . Anemia   . Anxiety   . Arthritis    right knee  . Depression   . Diabetes mellitus    IDDM  . Disc degeneration, lumbar   . Fibromyalgia   . Hyperlipidemia   . Hypertension    under control, has been on med. > 1 yr.  [place] body movements    while awake and asleep  . 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 Shortness of breath dyspnea    with  exertion  . Vertigo 2015  . Wears dentures    upper  . Wears partial dentures    lower    Patient Active Problem List   Diagnosis Date Noted  . Menorrhagia with irregular cycle 07/25/2014  . Iron deficiency anemia due to chronic blood loss 07/25/2014  . Follow up 05/23/2014  . Fibroid, uterine 04/04/2014  . Abnormal uterine bleeding (AUB) 03/26/2014  . Diabetic peripheral neuropathy ([place]) 12/21/2013  . Low back pain 12/11/2013  . Pain in joint, shoulder region 12/11/2013  . Pain in limb 12/11/2013  . 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 Diabetes ([place]) 07/28/2012  . Hypertension 07/28/2012  . Hypercholesteremia 07/28/2012  . 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 BREAST LUMPECTOMY WITH NEEDLE LOCALIZATION  08/15/2012   Procedure: BREAST LUMPECTOMY WITH NEEDLE LOCALIZATION;  Surgeon: Haywood Lasso, MD;  Location: [facility];  Service: General;  Laterality: Right;  Wire localizations Right breast calcifications  . BREAST SURGERY    . DILITATION & CURRETTAGE/HYSTROSCOPY WITH THERMACHOICE ABLATION N/A 08/21/2014   Procedure: DILATATION & CURETTAGE/HYSTEROSCOPY WITH THERMACHOICE ABLATION;  Surgeon: Jonnie Kind, MD;  Location: [facility];  Service: Gynecology;  Laterality: N/A;  . KNEE ARTHROSCOPY Left 10/16/2015   Procedure: ARTHROSCOPY KNEE with  debridment;  Surgeon: Frederik Pear, MD;  Location: [facility];  Service: Orthopedics;  Laterality: Left;  . KNEE ARTHROSCOPY WITH MEDIAL MENISECTOMY Right 07/17/2015   Procedure: KNEE ARTHROSCOPY WITH MEDIAL MENISECTOMY;  Surgeon: Frederik Pear, MD;  Location: [facility];  Service: Orthopedics;  Laterality: Right;  . POLYPECTOMY N/A 08/21/2014   Procedure: ENDOMETRIAL POLYPECTOMY;  Surgeon: Jonnie Kind, MD;  Location: [facility];  Service: Gynecology;  Laterality: N/A;  . REFRACTIVE SURGERY Right    micro aneuysms     OB History    Gravida Para Term Preterm AB Living   0             SAB TAB Ectopic Multiple Live Births                   Home Medications    Prior to Admission medications   Medication Sig Start Date End Date Taking? Authorizing Provider  albuterol (PROVENTIL HFA;VENTOLIN HFA) 108 (90 Base) MCG/ACT inhaler Inhale 2 puffs into the lungs every 6 (six) hours as needed for wheezing or shortness of breath.     Historical Provider, MD  amitriptyline (ELAVIL) 75 MG tablet Take 75 mg by mouth at bedtime.    Historical Provider, MD  benazepril (LOTENSIN) 20 MG tablet Take 10 mg by mouth daily.     Historical Provider, MD  bumetanide (BUMEX) 0.5 MG tablet Take 0.5-1 mg by mouth 2 (two) times daily.    Historical Provider, MD  docusate sodium (COLACE) 100 MG capsule Take 100 mg by mouth daily.    Historical Provider, MD  DULoxetine (CYMBALTA) 60 MG capsule Take 1 capsule (60 mg total) by mouth daily. 12/14/13   Marcial Pacas, MD  fish oil-omega-3 fatty acids 1000 MG capsule Take 1 g by mouth 2 (two) times daily.     Historical Provider, MD  glipiZIDE (GLUCOTROL) 5 MG tablet Take 5 mg by mouth daily before breakfast.     Historical Provider, MD  HYDROcodone-acetaminophen (HYCET) 7.5-325 mg/15 ml solution Take 15 mLs by mouth every 8 (eight) hours as needed for moderate pain. 03/25/16   Nona Dell, PA-C  hydrocortisone cream 1 % Apply to affected area 2 times daily 08/12/15   Gloriann Loan, PA-C  hydrOXYzine (ATARAX/VISTARIL) 25 MG tablet Take 25 mg by mouth at bedtime.    Historical Provider, MD  insulin glargine (LANTUS) 100 UNIT/ML injection Inject 32 Units into the skin at bedtime.     Historical Provider, MD  Insulin Lispro Prot & Lispro (HUMALOG MIX 50/50 KWIKPEN) (50-50) 100 UNIT/ML Kwikpen Inject 3-10 Units into the skin 3 (three) times daily with meals. Sliding scale    Historical Provider, MD  Iron-FA-B Cmp-C-Biot-Probiotic (FUSION PLUS) CAPS Take 1 capsule by mouth daily.      Historical Provider, MD  magnesium 30 MG tablet Take 250 mg by mouth 2 (two) times daily.    Historical Provider, MD  Melatonin 10 MG TABS Take 10 mg by mouth at bedtime.    Historical Provider, MD  methocarbamol (ROBAXIN) 500 MG tablet Take 500 mg by mouth 2 (two) times daily.     Historical Provider, MD  metoprolol (LOPRESSOR) 50 MG tablet Take 12.5 mg by mouth 2 (two) times daily.     Historical Provider, MD  oxymetazoline (AFRIN NASAL SPRAY) 0.05 % nasal spray Place 1 spray into both nostrils 2 (two) times daily. 03/25/16   Nona Dell, PA-C  Probiotic Product (TRUBIOTICS) CAPS Take 1 capsule by mouth  at bedtime.     Historical Provider, MD  rosuvastatin (CRESTOR) 40 MG tablet Take 40 mg by mouth daily.    Historical Provider, MD  sitaGLIPtin-metformin (JANUMET) 50-1000 MG tablet Take 1 tablet by mouth 2 (two) times daily with a meal.    Historical Provider, MD  tamoxifen (NOLVADEX) 20 MG tablet TAKE 1 TABLET BY MOUTH ONCE DAILY 12/04/15   Nicholas Lose, MD  traMADol (ULTRAM) 50 MG tablet Take 50 mg by mouth every 6 (six) hours as needed for moderate pain.     Historical Provider, MD    Family History Family History  Problem Relation Age of Onset  . Breast cancer Paternal Grandmother 82    breast; dbl mastectomy  . Diabetes Father   . Heart disease Father   . Hypertension Father   . Heart attack Father   . Diabetes Mother   . Heart disease Mother   . Hypertension Mother   . Diabetes Sister   . Fibromyalgia Sister   . Diabetes Sister   . Heart attack Maternal Grandmother     <35  . Heart attack Maternal Grandfather   . Heart attack Paternal Grandfather   . Diabetes Brother     Social History Social History  Substance Use Topics  . Smoking status: Never Smoker  . Smokeless tobacco: Never Used  . Alcohol use No     Allergies   Other; Penicillins; and Seasonal ic [cholestatin]   Review of Systems Review of Systems  All other systems reviewed and are  negative.    Physical Exam Updated Vital Signs BP 108/66 (BP Location: Right Arm)   Pulse 87   Temp 98.1 F (36.7 C) (Oral)   Resp 16   SpO2 100%   Physical Exam  Constitutional: She appears well-developed and well-nourished. No distress.  HENT:  Head: Normocephalic and atraumatic.  Mouth/Throat: Oropharynx is clear and moist. No oropharyngeal exudate.  Eyes: Conjunctivae and EOM are normal. Pupils are equal, round, and reactive to light. Right eye exhibits no discharge. Left eye exhibits no discharge. No scleral icterus.  Neck: Normal range of motion. Neck supple. No JVD present. No thyromegaly present.  Cardiovascular: Normal rate, regular rhythm, normal heart sounds and intact distal pulses.  Exam reveals no gallop and no friction rub.   No murmur heard. Pulmonary/Chest: Effort normal and breath sounds normal. No respiratory distress. She has no wheezes. She has no rales.  Abdominal: Soft. Bowel sounds are normal. She exhibits no distension and no mass. There is no tenderness.  Musculoskeletal: Normal range of motion. She exhibits no edema or tenderness.  Lymphadenopathy:    She has no cervical adenopathy.  Neurological: She is alert. Coordination normal.  Speech is clear, cranial nerves III through XII are intact, memory is intact, strength is normal in all 4 extremities including grips, sensation is intact to light touch and pinprick in all 4 extremities. Coordination as tested by finger-nose-finger is normal, no limb ataxia. Normal gait, normal reflexes at the patellar tendons bilaterally  Skin: Skin is warm and dry. No rash noted. No erythema.  Psychiatric: She has a normal mood and affect. Her behavior is normal.  Nursing note and vitals reviewed.    ED Treatments / Results  Labs (all labs ordered are listed, but only abnormal results are displayed) Labs Reviewed  BASIC METABOLIC PANEL - Abnormal; Notable for the following:       Result Value   Glucose, Bld 220 (*)      BUN 26 (*)  Creatinine, Ser 1.37 (*)    Calcium 8.6 (*)    GFR calc non Af Amer 45 (*)    GFR calc Af Amer 52 (*)    All other components within normal limits  CBC - Abnormal; Notable for the following:    WBC 11.2 (*)    Hemoglobin 11.7 (*)    All other components within normal limits  URINALYSIS, ROUTINE W REFLEX MICROSCOPIC (NOT AT [facility]) - Abnormal; Notable for the following:    Leukocytes, UA MODERATE (*)    All other components within normal limits  URINE MICROSCOPIC-ADD ON - Abnormal; Notable for the following:    Squamous Epithelial / LPF 0-5 (*)    Bacteria, UA RARE (*)    All other components within normal limits  CBG MONITORING, ED    EKG  EKG Interpretation  Date/Time:  Sunday May 24 2016 14:53:58 EDT Ventricular Rate:  87 PR Interval:  142 QRS Duration: 86 QT Interval:  378 QTC Calculation: 454 R Axis:   -26 Text Interpretation:  Normal sinus rhythm Possible Anterolateral infarct , age undetermined Abnormal ECG since last tracing no significant change Confirmed by Andrian Urbach  MD, Melady Chow ([id number]) on 05/24/2016 4:03:59 PM       Radiology No results found.  Procedures Procedures (including critical care time)  Medications Ordered in ED Medications - No data to display   Initial Impression / Assessment and Plan / ED Course  I have reviewed the triage vital signs and the nursing notes.  Pertinent labs & imaging results that were available during my care of the patient were reviewed by me and considered in my medical decision making (see chart for details).  Clinical Course  Comment By Time  The patient appears very well, she has a slight leukocytosis but no significant anemia, no thrombus that opinion, mild hyperglycemia at 220 and a creatinine of 1.3 which sounds like it is better than her baseline. She has a urinalysis which is clean and shows no signs of significant dehydration. She appears well, she has a normal neurologic exam, her EKG is also  unremarkable and unchanged. She will be discharged home in stable condition to follow-up in the outpatient setting. Noemi Chapel, MD 08/13 [phone]   Discussed with the patient and family all the results and the VS, they are in agreement to be d/c home - will f/u for CRI and hyperglycemia.  She is asymptomatic in the ED.  Noemi Chapel, MD 08/13 1707      Final Clinical Impressions(s) / ED Diagnoses   Final diagnoses:  Hyperglycemia  Renal insufficiency  Light headed    New Prescriptions New Prescriptions   No medications on file     Noemi Chapel, MD 05/24/16 1708

## 2016-05-24 NOTE — Discharge Instructions (Signed)
Please take your blood sugar medications tonight and eat a STRICT diabetic diet - this will help reduce the progression of your neuropathy.  Drink lots of fluids  Return to the ER for severe or worsening symptoms.

## 2016-06-11 MED FILL — TAMOXIFEN 20 MG TABLET: 20 | 90 days supply | Qty: 90 | Fill #2

## 2016-06-30 ENCOUNTER — Ambulatory Visit (INDEPENDENT_AMBULATORY_CARE_PROVIDER_SITE_OTHER): Payer: Medicaid Other | Admitting: Podiatry

## 2016-06-30 ENCOUNTER — Encounter: Payer: Self-pay | Admitting: Podiatry

## 2016-06-30 VITALS — BP 106/68 | HR 84 | Resp 16

## 2016-06-30 DIAGNOSIS — M79676 Pain in unspecified toe(s): Secondary | ICD-10-CM | POA: Diagnosis not present

## 2016-06-30 DIAGNOSIS — M79609 Pain in unspecified limb: Principal | ICD-10-CM

## 2016-06-30 DIAGNOSIS — E1140 Type 2 diabetes mellitus with diabetic neuropathy, unspecified: Secondary | ICD-10-CM | POA: Diagnosis not present

## 2016-06-30 DIAGNOSIS — B351 Tinea unguium: Secondary | ICD-10-CM

## 2016-06-30 DIAGNOSIS — Z794 Long term (current) use of insulin: Secondary | ICD-10-CM

## 2016-06-30 NOTE — Progress Notes (Signed)
Patient ID: Alicia Coffey, female   DOB: August 25, 1971, 45 y.o.   MRN: [id number] Complaint:  Visit Type: Patient returns to my office for continued preventative foot care services. Complaint: Patient states" my nails have grown long and thick and become painful to walk and wear shoes" Patient has been diagnosed with DM with neuropathy. The patient presents for preventative foot care services. No changes to ROS.  Patient has developed painful area on tip of second toe left foot.  Podiatric Exam: Vascular: dorsalis pedis and posterior tibial pulses are palpable bilateral. Capillary return is immediate. Temperature gradient is WNL. Skin turgor WNL  Sensorium: Diminished  Semmes Weinstein monofilament test. Normal tactile sensation bilaterally. Nail Exam: Pt has thick disfigured discolored nails with subungual debris noted bilateral entire nail hallux through fifth toenails Ulcer Exam: There is no evidence of ulcer or pre-ulcerative changes or infection. Orthopedic Exam: Muscle tone and strength are WNL. No limitations in general ROM. No crepitus or effusions noted. Foot type and digits show no abnormalities. Bony prominences are unremarkable. Rigid hammer toes  3,4 B/L Skin: No Porokeratosis. No infection or ulcers.  Clavi second toe left foot.  Diagnosis:  Onychomycosis, , Pain in right toe, pain in left toes  Treatment & Plan Procedures and Treatment: Consent by patient was obtained for treatment procedures. The patient understood the discussion of treatment and procedures well. All questions were answered thoroughly reviewed. Debridement of mycotic and hypertrophic toenails, 1 through 5 bilateral and clearing of subungual debris. No ulceration, no infection noted. Debride clavi Return Visit-Office Procedure: Patient instructed to return to the office for a follow up visit 3 months for continued evaluation and treatment.  Gardiner Barefoot DPM

## 2016-08-22 LAB — GLUCOSE, POCT (MANUAL RESULT ENTRY): POC GLUCOSE: 113 mg/dL — AB (ref 70–99)

## 2016-09-16 NOTE — Assessment & Plan Note (Signed)
Right breast LCIS: Status post lumpectomy 08/15/2012 and is currently on tamoxifen 20 mg daily since December 2013  Tamoxifen toxicities 1. Uterine hyperplasia/Polyp status post ablation: No further problems with uterine bleeding 2. Hot flashes  Patient was also diagnosed with fibromyalgia and neuropathy and is on multiple medications for that.  Breast Cancer Surveillance: 1. Breast exam 09/17/2016: lumpy and nodular breasts. 2. Mammogram 01/09/2016 No abnormalities. Postsurgical changes. Breast Density Category D. I recommended that she get 3-D mammograms for surveillance.I Discussed the differences between different breast density categories.   Return to clinic in 1 year for follow-up

## 2016-09-17 ENCOUNTER — Telehealth: Payer: Self-pay | Admitting: Hematology and Oncology

## 2016-09-17 ENCOUNTER — Encounter: Payer: Self-pay | Admitting: Hematology and Oncology

## 2016-09-17 ENCOUNTER — Ambulatory Visit (HOSPITAL_BASED_OUTPATIENT_CLINIC_OR_DEPARTMENT_OTHER): Payer: Medicaid Other | Admitting: Hematology and Oncology

## 2016-09-17 DIAGNOSIS — D0501 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: Secondary | ICD-10-CM

## 2016-09-17 DIAGNOSIS — N951 Menopausal and female climacteric states: Secondary | ICD-10-CM

## 2016-09-17 DIAGNOSIS — Z79810 Long term (current) use of selective estrogen receptor modulators (SERMs): Secondary | ICD-10-CM

## 2016-09-17 MED ORDER — TAMOXIFEN CITRATE 20 MG PO TABS: 20.0000 mg | ORAL_TABLET | Freq: Every day | ORAL | 3 refills | Status: DC

## 2016-09-17 MED ORDER — INSULIN GLARGINE 100 UNIT/ML ~~LOC~~ SOLN: 28.0000 [IU] | Freq: Every day | SUBCUTANEOUS | Status: DC

## 2016-09-17 MED FILL — TAMOXIFEN 20 MG TABLET: 20 | 90 days supply | Qty: 90 | Fill #3

## 2016-09-17 NOTE — Telephone Encounter (Signed)
Gave patient avs report and appointments for December 2018.  °

## 2016-09-17 NOTE — Progress Notes (Signed)
Patient Care Team: Vonna Drafts, FNP as PCP - General (Nurse Practitioner)  DIAGNOSIS:  Encounter Diagnosis  Name Primary?  . 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 She appears to be tolerating tamoxifen extremely well. She has completed 4 years of therapy. She denies any new lumps or nodules in the breasts. She continues to have annual mammograms.She has multiple medical problems including fibromyalgia, dizziness and vertigo. She also having recent problems with trouble swallowing. She is seeing an ENT specialist for this. She is working with her primary care physician for her multiple other problems. She does have profound hot flashes intermittently.  REVIEW OF SYSTEMS:   Constitutional: Denies fevers, chills or abnormal weight loss Eyes: Denies blurriness of vision Ears, nose, mouth, throat, and face: Denies mucositis or sore throat Respiratory: Denies cough, dyspnea or wheezes Cardiovascular: Denies palpitation, chest discomfort Gastrointestinal:  Recent problems with trouble swallowing and regurgitation Skin: Denies abnormal skin rashes Lymphatics: Denies new lymphadenopathy or easy bruising Neurological: Dizziness and vertigo Behavioral/Psych: Mood is stable, no new changes  Extremities: No lower extremity edema Breast:  denies any pain or lumps or nodules in either breasts All other systems were reviewed with the patient and are negative.  I have reviewed the past medical history, past surgical history, social history and family history with the patient and they are unchanged from previous note.  ALLERGIES:  is allergic to other; penicillins; and seasonal ic [cholestatin].  MEDICATIONS:    Current Outpatient Prescriptions  Medication Sig Dispense Refill  . albuterol (PROVENTIL HFA;VENTOLIN HFA) 108 (90 Base) MCG/ACT inhaler Inhale 2 puffs into the lungs every 6 (six) hours as needed for wheezing or shortness of breath.     Marland Kitchen amitriptyline (ELAVIL) 75 MG tablet Take 75 mg by mouth at bedtime.    . benazepril (LOTENSIN) 20 MG tablet Take 10 mg by mouth daily.     . bumetanide (BUMEX) 0.5 MG tablet Take 0.5-1 mg by mouth 2 (two) times daily.    Marland Kitchen docusate sodium (COLACE) 100 MG capsule Take 100 mg by mouth daily.    . DULoxetine (CYMBALTA) 60 MG capsule Take 1 capsule (60 mg total) by mouth daily. 30 capsule 12  . fish oil-omega-3 fatty acids 1000 MG capsule Take 1 g by mouth 2 (two) times daily.     Marland Kitchen glipiZIDE (GLUCOTROL) 5 MG tablet Take 5 mg by mouth daily before breakfast.     . HYDROcodone-acetaminophen (HYCET) 7.5-325 mg/15 ml solution Take 15 mLs by mouth every 8 (eight) hours as needed for moderate pain. 120 mL 0  . hydrocortisone cream 1 % Apply to affected area 2 times daily 15 g 0  . hydrOXYzine (ATARAX/VISTARIL) 25 MG tablet Take 25 mg by mouth at bedtime.    . insulin glargine (LANTUS) 100 UNIT/ML injection Inject 0.28 mLs (28 Units total) into the skin at bedtime. 10 mL   . Insulin Lispro Prot & Lispro (HUMALOG MIX 50/50 KWIKPEN) (50-50) 100 UNIT/ML Kwikpen Inject 3-10 Units into the skin 3 (three) times daily with meals. Sliding scale    . Iron-FA-B Cmp-C-Biot-Probiotic (FUSION PLUS) CAPS Take 1 capsule by mouth daily.     . magnesium 30 MG tablet Take 250 mg by mouth 2 (two) times daily.    Marland Kitchen  Melatonin 10 MG TABS Take 10 mg by mouth at bedtime.    . methocarbamol (ROBAXIN) 500 MG tablet Take 500 mg by mouth 2 (two) times daily.     . metoprolol (LOPRESSOR) 50 MG tablet Take 12.5 mg by mouth 2 (two) times daily.     Marland Kitchen oxymetazoline (AFRIN NASAL SPRAY) 0.05 % nasal spray Place 1 spray into both nostrils 2 (two) times daily. 30 mL 0  . rosuvastatin (CRESTOR) 40 MG  tablet Take 40 mg by mouth daily.    . sitaGLIPtin-metformin (JANUMET) 50-1000 MG tablet Take 1 tablet by mouth 2 (two) times daily with a meal.    . tamoxifen (NOLVADEX) 20 MG tablet Take 1 tablet (20 mg total) by mouth daily. 90 tablet 3  . traMADol (ULTRAM) 50 MG tablet Take 50 mg by mouth every 6 (six) hours as needed for moderate pain.      No current facility-administered medications for this visit.     PHYSICAL EXAMINATION: ECOG PERFORMANCE STATUS: 1 - Symptomatic but completely ambulatory  Vitals:   09/17/16 1425  BP: 123/78  Pulse: 97  Resp: 18  Temp: 98.5 F (36.9 C)   Filed Weights   09/17/16 1425  Weight: 239 lb 12.8 oz (108.8 kg)    GENERAL:alert, no distress and comfortable SKIN: skin color, texture, turgor are normal, no rashes or significant lesions EYES: normal, Conjunctiva are pink and non-injected, sclera clear OROPHARYNX:no exudate, no erythema and lips, buccal mucosa, and tongue normal  NECK: supple, thyroid normal size, non-tender, without nodularity LYMPH:  no palpable lymphadenopathy in the cervical, axillary or inguinal LUNGS: clear to auscultation and percussion with normal breathing effort HEART: regular rate & rhythm and no murmurs and no lower extremity edema ABDOMEN:abdomen soft, non-tender and normal bowel sounds MUSCULOSKELETAL:no cyanosis of digits and no clubbing  NEURO: alert & oriented x 3 with fluent speech, no focal motor/sensory deficits EXTREMITIES: No lower extremity edema BREAST: No palpable masses or nodules in either right or left breasts. No palpable axillary supraclavicular or infraclavicular adenopathy no breast tenderness or nipple discharge. (exam performed in the presence of a chaperone)  LABORATORY DATA:  I have reviewed the data as listed   Chemistry      Component Value Date/Time   NA 137 05/24/2016 1503   NA 139 09/17/2015 1019   K 4.0 05/24/2016 1503   K 4.4 09/17/2015 1019   CL 101 05/24/2016 1503   CL 106  01/09/2013 0855   CO2 27 05/24/2016 1503   CO2 25 09/17/2015 1019   BUN 26 (H) 05/24/2016 1503   BUN 23.6 09/17/2015 1019   CREATININE 1.37 (H) 05/24/2016 1503   CREATININE 1.2 (H) 09/17/2015 1019      Component Value Date/Time   CALCIUM 8.6 (L) 05/24/2016 1503   CALCIUM 9.3 09/17/2015 1019   ALKPHOS 60 09/17/2015 1019   AST 22 09/17/2015 1019   ALT 15 09/17/2015 1019   BILITOT 0.33 09/17/2015 1019       Lab Results  Component Value Date   WBC 11.2 (H) 05/24/2016   HGB 11.7 (L) 05/24/2016   HCT 36.6 05/24/2016   MCV 87.1 05/24/2016   PLT 189 05/24/2016   NEUTROABS 5.3 01/15/2016    ASSESSMENT & PLAN:  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 Uterine hyperplasia/Polyp status post ablation: No further problems with uterine bleeding 2. Hot  flashes I discussed with the patient that she has taken tamoxifen for the past 4 yearsand if she would like to stop it, it would be completely reasonable given the multiple other medications that she currently takes. She tells me that she will stop it for 2 weeks and she feels much better then she will stop tamoxifen.  Patient was also diagnosed with fibromyalgia and neuropathy and is on multiple medications for that.  Breast Cancer Surveillance: 1. Breast exam 09/17/2016: No palpable lumps or nodules of concern 2. Mammogram 01/09/2016 No abnormalities. Postsurgical changes. Breast Density Category D. I recommended that she get 3-D mammograms for surveillance.I Discussed the differences between different breast density categories.   Return to clinic in 1 year for follow-up And after that she could be followed by her primary care   No orders of the defined types were placed in this encounter.  The patient has a good understanding of the overall plan. she agrees with it. she will call with any  problems that may develop before the next visit here.   Rulon Eisenmenger, MD 09/17/16

## 2016-09-21 DIAGNOSIS — E669 Obesity, unspecified: Secondary | ICD-10-CM | POA: Insufficient documentation

## 2016-09-21 DIAGNOSIS — G609 Hereditary and idiopathic neuropathy, unspecified: Secondary | ICD-10-CM | POA: Insufficient documentation

## 2016-09-21 DIAGNOSIS — R1312 Dysphagia, oropharyngeal phase: Secondary | ICD-10-CM | POA: Insufficient documentation

## 2016-09-21 DIAGNOSIS — E139 Other specified diabetes mellitus without complications: Secondary | ICD-10-CM | POA: Insufficient documentation

## 2016-09-21 DIAGNOSIS — G4733 Obstructive sleep apnea (adult) (pediatric): Secondary | ICD-10-CM | POA: Insufficient documentation

## 2016-09-21 DIAGNOSIS — E66813 Obesity, class 3: Secondary | ICD-10-CM | POA: Insufficient documentation

## 2016-09-21 DIAGNOSIS — R42 Dizziness and giddiness: Secondary | ICD-10-CM | POA: Insufficient documentation

## 2016-09-21 HISTORY — DX: Obstructive sleep apnea (adult) (pediatric): G47.33

## 2016-09-29 ENCOUNTER — Encounter: Payer: Self-pay | Admitting: Podiatry

## 2016-09-29 ENCOUNTER — Ambulatory Visit (INDEPENDENT_AMBULATORY_CARE_PROVIDER_SITE_OTHER): Payer: Medicaid Other | Admitting: Podiatry

## 2016-09-29 VITALS — BP 157/112 | HR 83 | Resp 16

## 2016-09-29 DIAGNOSIS — M79671 Pain in right foot: Secondary | ICD-10-CM

## 2016-09-29 DIAGNOSIS — M79609 Pain in unspecified limb: Principal | ICD-10-CM

## 2016-09-29 DIAGNOSIS — B351 Tinea unguium: Secondary | ICD-10-CM | POA: Diagnosis not present

## 2016-09-29 DIAGNOSIS — E1142 Type 2 diabetes mellitus with diabetic polyneuropathy: Secondary | ICD-10-CM

## 2016-09-29 DIAGNOSIS — E1140 Type 2 diabetes mellitus with diabetic neuropathy, unspecified: Secondary | ICD-10-CM

## 2016-09-29 DIAGNOSIS — Z794 Long term (current) use of insulin: Secondary | ICD-10-CM

## 2016-09-29 NOTE — Progress Notes (Signed)
Patient ID: Alicia Coffey, female   DOB: Sep 12, 1971, 45 y.o.   MRN: [id number]   Subjective: This patient presents for scheduled visit complaining of uncomfortable toenails walking wearing shoes and request toenail debridement. Patient is a diabetic denies history of skin ulceration claudication or amputation. Patient says she has a history of neuropathy  Objective: Orientated 3 DP pulses 2/4 bilaterally PT pulses 1/4 bilaterally Capillary reflex immediate bilaterally Sensation to 10 g monofilament wire intact 2/5 bilaterally Vibratory sensation reactive right nonreactive left Ankle reflexes reactive bilaterally Toenails elongated, hypertrophic, discolored 6-10 with tenderness to direct palpation Hammertoe second left Manual motor testing dorsi flexion, plantar flexion, inversion, eversion 5/5 bilaterally Patient walks slowly with cane  Assessment: Diabetic peripheral neuropathy  Symptomatic onychomycoses 6-10  Plan: Debridement toenails 6-10 mechanically and electrical without any bleeding.  Reappoint 3 months

## 2016-09-29 NOTE — Patient Instructions (Signed)

## 2016-10-16 ENCOUNTER — Encounter (HOSPITAL_BASED_OUTPATIENT_CLINIC_OR_DEPARTMENT_OTHER): Payer: Self-pay

## 2016-10-16 DIAGNOSIS — R0683 Snoring: Secondary | ICD-10-CM

## 2016-10-18 ENCOUNTER — Emergency Department (HOSPITAL_COMMUNITY)
Admission: EM | Admit: 2016-10-18 | Discharge: 2016-10-18 | Disposition: A | Discharge status: Home / Self Care | Payer: Medicaid Other | Attending: Emergency Medicine | Admitting: Emergency Medicine

## 2016-10-18 ENCOUNTER — Emergency Department (HOSPITAL_COMMUNITY): Payer: Medicaid Other

## 2016-10-18 ENCOUNTER — Encounter (HOSPITAL_COMMUNITY): Payer: Self-pay | Admitting: Emergency Medicine

## 2016-10-18 DIAGNOSIS — I10 Essential (primary) hypertension: Secondary | ICD-10-CM | POA: Diagnosis not present

## 2016-10-18 DIAGNOSIS — G43809 Other migraine, not intractable, without status migrainosus: Secondary | ICD-10-CM | POA: Diagnosis not present

## 2016-10-18 DIAGNOSIS — E119 Type 2 diabetes mellitus without complications: Secondary | ICD-10-CM | POA: Diagnosis not present

## 2016-10-18 DIAGNOSIS — Z794 Long term (current) use of insulin: Secondary | ICD-10-CM | POA: Diagnosis not present

## 2016-10-18 DIAGNOSIS — G43009 Migraine without aura, not intractable, without status migrainosus: Secondary | ICD-10-CM

## 2016-10-18 DIAGNOSIS — R51 Headache: Secondary | ICD-10-CM | POA: Diagnosis present

## 2016-10-18 DIAGNOSIS — Z853 Personal history of malignant neoplasm of breast: Secondary | ICD-10-CM | POA: Diagnosis not present

## 2016-10-18 HISTORY — DX: Malignant (primary) neoplasm, unspecified: C80.1

## 2016-10-18 LAB — CBG MONITORING, ED: GLUCOSE-CAPILLARY: 127 mg/dL — AB (ref 65–99)

## 2016-10-18 MED ORDER — DIPHENHYDRAMINE HCL 50 MG/ML IJ SOLN
25.0000 mg | Freq: Once | INTRAMUSCULAR | Status: AC
  Administered 2016-10-18: 25 mg via INTRAVENOUS
  Filled 2016-10-18: qty 1

## 2016-10-18 MED ORDER — PROCHLORPERAZINE EDISYLATE 5 MG/ML IJ SOLN
10.0000 mg | Freq: Once | INTRAMUSCULAR | Status: AC
  Administered 2016-10-18: 10 mg via INTRAVENOUS
  Filled 2016-10-18: qty 2

## 2016-10-18 MED ORDER — SODIUM CHLORIDE 0.9 % IV BOLUS (SEPSIS)
500.0000 mL | Freq: Once | INTRAVENOUS | Status: AC
  Administered 2016-10-18: 500 mL via INTRAVENOUS

## 2016-10-18 MED ORDER — MECLIZINE HCL 25 MG PO TABS
25.0000 mg | ORAL_TABLET | Freq: Once | ORAL | Status: AC
  Administered 2016-10-18: 25 mg via ORAL
  Filled 2016-10-18: qty 1

## 2016-10-18 NOTE — ED Triage Notes (Signed)
[place] year  Increasing for the last few months , today was worse than before ,dx with vertigo since 57 , took a meclazine last night ,  Blurred vision cames and goes also, had some today she states,  Rt leg was trembling , has appoinment with neuro next week saw primary care dr on friday

## 2016-10-18 NOTE — Discharge Instructions (Signed)
Please get a full nights rest tonight and stay well-hydrated. He may take Tylenol for any further headaches. Also work on avoiding stressors in your life to help prevent recurrence of symptoms.

## 2016-10-18 NOTE — ED Provider Notes (Signed)
[facility] DEPT Provider Note   CSN: [id number] Arrival date & time: 10/18/16  1339     History   Chief Complaint Chief Complaint  Patient presents with  . Headache    HPI Alicia Coffey is a 46 y.o. female.  The history is provided by the patient and a relative.  Dizziness  Quality:  Imbalance and vertigo Severity:  Moderate Onset quality:  Gradual Duration:  4 hours Timing:  Constant Progression:  Waxing and waning Chronicity:  Recurrent Context: eye movement and head movement   Context: not with loss of consciousness   Relieved by:  Closing eyes and being still Worsened by:  Eye movement, movement and turning head Associated symptoms: no chest pain, no headaches, no nausea, no palpitations, no shortness of breath, no syncope, no tinnitus, no vision changes and no weakness   Patient states she has a abnormal head sensation that feels "off and "weird. Patient denies any pain or headaches. Patient states she also felt like she was leaning to the left while she was walking today. Patient states this has happened before however today it seemed to be worse. Patient has history of vertigo, fibromyalgia, hypertension, hyperlipidemia, peripheral neuropathy.   Past Medical History:  Diagnosis Date  . Anemia   . Anxiety   . Arthritis    right knee  . Cancer ([place])   . Depression   . Diabetes mellitus    IDDM  . Disc degeneration, lumbar   . Fibromyalgia   . Hyperlipidemia   . Hypertension    under control, has been on med. > 1 yr.  [place] body movements    while awake and asleep  . 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 Shortness of breath dyspnea    with exertion  . Vertigo 2015  . Wears dentures    upper  . Wears partial dentures    lower    Patient Active Problem List   Diagnosis Date Noted  . Menorrhagia with irregular cycle 07/25/2014  . Iron deficiency anemia due to chronic blood loss  07/25/2014  . Follow up 05/23/2014  . Fibroid, uterine 04/04/2014  . Abnormal uterine bleeding (AUB) 03/26/2014  . Diabetic peripheral neuropathy ([place]) 12/21/2013  . Low back pain 12/11/2013  . Pain in joint, shoulder region 12/11/2013  . Pain in limb 12/11/2013  . 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 Diabetes ([place]) 07/28/2012  . Hypertension 07/28/2012  . Hypercholesteremia 07/28/2012  . 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 BREAST LUMPECTOMY WITH NEEDLE LOCALIZATION  08/15/2012   Procedure: BREAST LUMPECTOMY WITH NEEDLE LOCALIZATION;  Surgeon: Haywood Lasso, MD;  Location: [facility];  Service: General;  Laterality: Right;  Wire localizations Right breast calcifications  . BREAST SURGERY    . DILITATION & CURRETTAGE/HYSTROSCOPY WITH THERMACHOICE ABLATION N/A 08/21/2014   Procedure: DILATATION & CURETTAGE/HYSTEROSCOPY WITH THERMACHOICE ABLATION;  Surgeon: Jonnie Kind, MD;  Location: [facility];  Service: Gynecology;  Laterality: N/A;  . KNEE ARTHROSCOPY Left 10/16/2015   Procedure: ARTHROSCOPY KNEE with debridment;  Surgeon: Frederik Pear, MD;  Location: [facility];  Service: Orthopedics;  Laterality: Left;  . KNEE ARTHROSCOPY WITH MEDIAL MENISECTOMY Right 07/17/2015   Procedure: KNEE ARTHROSCOPY WITH MEDIAL MENISECTOMY;  Surgeon: Frederik Pear, MD;  Location: [facility];  Service: Orthopedics;  Laterality: Right;  .  POLYPECTOMY N/A 08/21/2014   Procedure: ENDOMETRIAL POLYPECTOMY;  Surgeon: Jonnie Kind, MD;  Location: [facility];  Service: Gynecology;  Laterality: N/A;  . REFRACTIVE SURGERY Right    micro aneuysms    OB History    Gravida Para Term Preterm AB Living   0             SAB TAB Ectopic Multiple Live Births                   Home Medications    Prior to Admission medications   Medication Sig Start Date End Date Taking? Authorizing  Provider  albuterol (PROVENTIL HFA;VENTOLIN HFA) 108 (90 Base) MCG/ACT inhaler Inhale 2 puffs into the lungs every 6 (six) hours as needed for wheezing or shortness of breath.    Yes Historical Provider, MD  amitriptyline (ELAVIL) 75 MG tablet Take 75 mg by mouth at bedtime.   Yes Historical Provider, MD  benazepril (LOTENSIN) 20 MG tablet Take 20 mg by mouth daily.    Yes Historical Provider, MD  bumetanide (BUMEX) 0.5 MG tablet Take 0.5-1 mg by mouth 2 (two) times daily.   Yes Historical Provider, MD  docusate sodium (COLACE) 100 MG capsule Take 100 mg by mouth daily.   Yes Historical Provider, MD  DULoxetine (CYMBALTA) 60 MG capsule Take 1 capsule (60 mg total) by mouth daily. 12/14/13  Yes Marcial Pacas, MD  fish oil-omega-3 fatty acids 1000 MG capsule Take 1 g by mouth at bedtime.    Yes Historical Provider, MD  glipiZIDE (GLUCOTROL) 5 MG tablet Take 5 mg by mouth daily before breakfast.    Yes Historical Provider, MD  HYDROcodone-acetaminophen (HYCET) 7.5-325 mg/15 ml solution Take 15 mLs by mouth every 8 (eight) hours as needed for moderate pain. 03/25/16  Yes Nona Dell, PA-C  hydrocortisone cream 1 % Apply to affected area 2 times daily 08/12/15  Yes Kayla Rose, PA-C  hydrOXYzine (ATARAX/VISTARIL) 25 MG tablet Take 25 mg by mouth at bedtime.   Yes Historical Provider, MD  insulin glargine (LANTUS) 100 UNIT/ML injection Inject 0.28 mLs (28 Units total) into the skin at bedtime. 09/17/16  Yes Nicholas Lose, MD  Insulin Lispro Prot & Lispro (HUMALOG MIX 50/50 KWIKPEN) (50-50) 100 UNIT/ML Kwikpen Inject 3-10 Units into the skin 3 (three) times daily with meals. Sliding scale   Yes Historical Provider, MD  Iron-FA-B Cmp-C-Biot-Probiotic (FUSION PLUS) CAPS Take 1 capsule by mouth daily.    Yes Historical Provider, MD  Magnesium 250 MG TABS Take 250 mg by mouth 2 (two) times daily.   Yes Historical Provider, MD  Melatonin 10 MG TABS Take 10 mg by mouth at bedtime.   Yes Historical Provider, MD    methocarbamol (ROBAXIN) 500 MG tablet Take 500 mg by mouth 2 (two) times daily.    Yes Historical Provider, MD  metoprolol succinate (TOPROL-XL) 25 MG 24 hr tablet Take 25 mg by mouth every morning.   Yes Historical Provider, MD  rosuvastatin (CRESTOR) 40 MG tablet Take 40 mg by mouth daily.   Yes Historical Provider, MD  sitaGLIPtin-metformin (JANUMET) 50-1000 MG tablet Take 1 tablet by mouth 2 (two) times daily with a meal.   Yes Historical Provider, MD  tamoxifen (NOLVADEX) 20 MG tablet Take 1 tablet (20 mg total) by mouth daily. 09/17/16  Yes Nicholas Lose, MD  traMADol (ULTRAM) 50 MG tablet Take 50 mg by mouth every 6 (six) hours as needed for moderate pain.    Yes Historical Provider,  MD  oxymetazoline (AFRIN NASAL SPRAY) 0.05 % nasal spray Place 1 spray into both nostrils 2 (two) times daily. Patient not taking: Reported on 10/18/2016 03/25/16   Nona Dell, PA-C    Family History Family History  Problem Relation Age of Onset  . Breast cancer Paternal Grandmother 5    breast; dbl mastectomy  . Diabetes Father   . Heart disease Father   . Hypertension Father   . Heart attack Father   . Diabetes Mother   . Heart disease Mother   . Hypertension Mother   . Diabetes Sister   . Fibromyalgia Sister   . Diabetes Sister   . Heart attack Maternal Grandmother     <35  . Heart attack Maternal Grandfather   . Heart attack Paternal Grandfather   . Diabetes Brother     Social History Social History  Substance Use Topics  . Smoking status: Never Smoker  . Smokeless tobacco: Never Used  . Alcohol use No     Allergies   Other; Penicillins; and Seasonal ic [cholestatin]   Review of Systems Review of Systems  Constitutional: Negative for chills and fever.  HENT: Negative for ear pain and tinnitus.   Respiratory: Negative for shortness of breath.   Cardiovascular: Negative for chest pain, palpitations and syncope.  Gastrointestinal: Negative for abdominal pain and  nausea.  Musculoskeletal: Negative for back pain, neck pain and neck stiffness.  Neurological: Positive for dizziness. Negative for syncope, speech difficulty, weakness, light-headedness, numbness (has baseline peripheral neuropathy) and headaches.  Psychiatric/Behavioral: Negative for confusion.  All other systems reviewed and are negative.    Physical Exam Updated Vital Signs BP 141/91   Pulse 86   Temp 99.1 F (37.3 C) (Oral)   Resp 20   SpO2 99%   Physical Exam  Constitutional: She is oriented to person, place, and time. She appears well-developed and well-nourished. No distress.  obese  HENT:  Head: Normocephalic and atraumatic.  Mouth/Throat: Oropharynx is clear and moist.  Eyes: Conjunctivae and EOM are normal. Pupils are equal, round, and reactive to light.  Neck: Normal range of motion. Neck supple.  Cardiovascular: Normal rate and regular rhythm.   No murmur heard. Pulmonary/Chest: Effort normal and breath sounds normal. No respiratory distress. She has no wheezes. She has no rales.  Abdominal: Soft. She exhibits no distension. There is no tenderness.  Musculoskeletal: She exhibits no edema.  Neurological: She is alert and oriented to person, place, and time. She has normal strength. She displays no tremor. No cranial nerve deficit or sensory deficit. She displays a negative Romberg sign. Coordination and gait (slow gait but no ataxia. Uses cane for ambulation) normal. GCS eye subscore is 4. GCS verbal subscore is 5. GCS motor subscore is 6.  5/5 strength all 4 ext.  Skin: Skin is warm and dry.  Psychiatric: She has a normal mood and affect.  Nursing note and vitals reviewed.    ED Treatments / Results  Labs (all labs ordered are listed, but only abnormal results are displayed) Labs Reviewed  CBG MONITORING, ED - Abnormal; Notable for the following:       Result Value   Glucose-Capillary 127 (*)    All other components within normal limits    EKG  EKG  Interpretation None       Radiology Ct Head Wo Contrast  Result Date: 10/18/2016 CLINICAL DATA:  Headache and tremors EXAM: CT HEAD WITHOUT CONTRAST TECHNIQUE: Contiguous axial images were obtained from the base  of the skull through the vertex without intravenous contrast. COMPARISON:  None. FINDINGS: Brain: The ventricles are normal in size and configuration. There is no intracranial mass, hemorrhage, extra-axial fluid collection, or midline shift. Gray-white compartments are normal. No acute infarct evident. Vascular: No hyperdense vessel. There is no appreciable vascular calcification. Skull: Bony calvarium appears intact. Sinuses/Orbits: There is mild mucosal thickening in several ethmoid air cells on the left. Other paranasal sinuses which are visualized are clear. Orbits appear symmetric bilaterally. Other: There is hazy opacity in several mastoid air cells bilaterally. IMPRESSION: No intracranial mass, hemorrhage, or extra-axial fluid collection. Gray-white compartments appear normal. Mild left ethmoid sinus disease. Patchy hazy opacity in several mastoid air cells on each side. Electronically Signed   By: Lowella Grip III M.D.   On: 10/18/2016 17:57    Procedures Procedures (including critical care time)  Medications Ordered in ED Medications  sodium chloride 0.9 % bolus 500 mL (500 mLs Intravenous New Bag/Given 10/18/16 1706)  prochlorperazine (COMPAZINE) injection 10 mg (10 mg Intravenous Given 10/18/16 1706)  diphenhydrAMINE (BENADRYL) injection 25 mg (25 mg Intravenous Given 10/18/16 1706)  meclizine (ANTIVERT) tablet 25 mg (25 mg Oral Given 10/18/16 1707)     Initial Impression / Assessment and Plan / ED Course  I have reviewed the triage vital signs and the nursing notes.  Pertinent labs & imaging results that were available during my care of the patient were reviewed by me and considered in my medical decision making (see chart for details).  Clinical Course    Patient is a  46 year old female with history of hypertension, hyperlipidemia, diabetes, jerky body movements, vertigo who presents with 4 hours of abnormal head sensation that the patient describes as "weird. Patient denies any headache or pain. Patient states she felt dizziness with the head sensation and felt like at one point she was leaning to the left.  On exam patient has no focal neurological deficits. Patient has normal coordination, negative Romberg, no ataxia on exam. At this time patient's symptoms are likely attributable to an atypical migraine. Doubt acute CVA, head bleed. Patient continues to express concern about her symptoms so a CT head is obtained to rule out any acute intracranial process.   CT head is negative for any acute findings. Patient given symptomatic treatment of her head symptoms and vertigo area and her vertigo was reproducible with head movement. Medications given as above. On reevaluation patient's symptoms have significantly improved. Patient is encouraged to distress, get a good night sleep, stay hydrated, and take Tylenol for any further head symptoms. Patient may follow up with PCP in 3 days if her symptoms do not resolve. Patient also has a neurology follow-up coming up which the patient is encouraged to go to.  Pt seen with attending Dr. Ashok Cordia.  Final Clinical Impressions(s) / ED Diagnoses   Final diagnoses:  Atypical migraine    New Prescriptions New Prescriptions   No medications on file     Tobie Poet, DO 10/18/16 2002    Lajean Saver, MD 10/23/16 [phone]

## 2016-10-18 NOTE — ED Notes (Signed)
CBG 127 

## 2016-12-01 ENCOUNTER — Ambulatory Visit (HOSPITAL_BASED_OUTPATIENT_CLINIC_OR_DEPARTMENT_OTHER): Payer: Medicaid Other | Attending: Nurse Practitioner | Admitting: Internal Medicine

## 2016-12-01 VITALS — Ht 63.5 in | Wt 243.0 lb

## 2016-12-01 DIAGNOSIS — R0683 Snoring: Secondary | ICD-10-CM | POA: Insufficient documentation

## 2016-12-12 DIAGNOSIS — R0683 Snoring: Secondary | ICD-10-CM

## 2016-12-12 NOTE — Procedures (Signed)
  Patient Name: Alicia Coffey, Alicia Coffey Date: 12/01/2016 Gender: Female D.O.B: [birth year] Age (years): 45 Referring Provider: [name] FNP Height (inches): 64 Interpreting Physician: Baird Lyons MD, ABSM Weight (lbs): 243 RPSGT: Zadie Rhine BMI: 42 MRN: [id number] Neck Size: 16.00 CLINICAL INFORMATION Sleep Study Type: NPSG  Indication for sleep study: Snoring  Epworth Sleepiness Score: 14  SLEEP STUDY TECHNIQUE As per the AASM Manual for the Scoring of Sleep and Associated Events v2.3 (April 2016) with a hypopnea requiring 4% desaturations.  The channels recorded and monitored were frontal, central and occipital EEG, electrooculogram (EOG), submentalis EMG (chin), nasal and oral airflow, thoracic and abdominal wall motion, anterior tibialis EMG, snore microphone, electrocardiogram, and pulse oximetry.  MEDICATIONS Medications self-administered by patient taken the night of the study : ALBUTEROL INHALER, ELAVIL, LOTENSIN, BUMEX, COLACE, CYMBALTA, MEGA OMEGA FISH OIL SOFTGELS, HYDROXYZINE, LANTUS, FUSION PLUS, MAGNESIUM, ROBAXIN, CRESTOR, [place] The study was initiated at 10:57:14 PM and ended at 5:06:29 AM.  Sleep onset time was 8.0 minutes and the sleep efficiency was 97.4%. The total sleep time was 359.5 minutes.  Stage REM latency was 141.0 minutes.  The patient spent 1.11% of the night in stage N1 sleep, 61.06% in stage N2 sleep, 31.85% in stage N3 and 5.98% in REM.  Alpha intrusion was absent.  Supine sleep was 100.00%.  RESPIRATORY PARAMETERS The overall apnea/hypopnea index (AHI) was 1.0 per hour. There were 3 total apneas, including 3 obstructive, 0 central and 0 mixed apneas. There were 3 hypopneas and 1 RERAs.  The AHI during Stage REM sleep was 0.0 per hour.  AHI while supine was 1.0 per hour.  The mean oxygen saturation was 96.26%. The minimum SpO2 during sleep was 91.00%.  Moderate snoring was noted during this  study.  CARDIAC DATA The 2 lead EKG demonstrated sinus rhythm. The mean heart rate was 85.22 beats per minute. Other EKG findings include: None.  LEG MOVEMENT DATA The total PLMS were 0 with a resulting PLMS index of 0.00. Associated arousal with leg movement index was 0.0 .  IMPRESSIONS - No significant obstructive sleep apnea occurred during this study (AHI = 1.0/h). - No significant central sleep apnea occurred during this study (CAI = 0.0/h). - The patient had minimal or no oxygen desaturation during the study (Min O2 = 91.00%) - The patient snored with Moderate snoring volume. - No cardiac abnormalities were noted during this study. - Clinically significant periodic limb movements did not occur during sleep. No significant associated arousals.  DIAGNOSIS - Primary Snoring (786.09 [R06.83 ICD-10])  RECOMMENDATIONS - Avoid alcohol, sedatives and other CNS depressants that may worsen sleep apnea and disrupt normal sleep architecture. - Sleep hygiene should be reviewed to assess factors that may improve sleep quality. - Weight management and regular exercise should be initiated or continued if appropriate.  [Electronically signed] 12/12/2016 11:21 AM  Baird Lyons MD, ABSM Diplomate, American Board of Sleep Medicine   NPI: [id number]  [place], American Board of Sleep Medicine  ELECTRONICALLY SIGNED ON:  12/12/2016, 11:18 AM [place] PH: [phone]   FX: [phone] [place]

## 2016-12-29 ENCOUNTER — Ambulatory Visit: Payer: Medicaid Other | Admitting: Podiatry

## 2017-01-06 ENCOUNTER — Encounter: Payer: Self-pay | Admitting: Hematology and Oncology

## 2017-01-12 ENCOUNTER — Other Ambulatory Visit: Payer: Self-pay | Admitting: Nurse Practitioner

## 2017-01-12 DIAGNOSIS — Z1231 Encounter for screening mammogram for malignant neoplasm of breast: Secondary | ICD-10-CM

## 2017-01-14 ENCOUNTER — Ambulatory Visit
Admission: RE | Admit: 2017-01-14 | Discharge: 2017-01-14 | Disposition: A | Discharge status: Home / Self Care | Payer: Medicaid Other | Source: Ambulatory Visit | Attending: Nurse Practitioner | Admitting: Nurse Practitioner

## 2017-01-14 DIAGNOSIS — Z1231 Encounter for screening mammogram for malignant neoplasm of breast: Secondary | ICD-10-CM

## 2017-02-10 ENCOUNTER — Ambulatory Visit (INDEPENDENT_AMBULATORY_CARE_PROVIDER_SITE_OTHER): Payer: Medicaid Other | Admitting: Podiatry

## 2017-02-10 DIAGNOSIS — B351 Tinea unguium: Secondary | ICD-10-CM

## 2017-02-10 DIAGNOSIS — R269 Unspecified abnormalities of gait and mobility: Secondary | ICD-10-CM

## 2017-02-10 DIAGNOSIS — E1142 Type 2 diabetes mellitus with diabetic polyneuropathy: Secondary | ICD-10-CM | POA: Diagnosis not present

## 2017-02-10 DIAGNOSIS — M79609 Pain in unspecified limb: Secondary | ICD-10-CM | POA: Diagnosis not present

## 2017-02-10 NOTE — Patient Instructions (Signed)

## 2017-02-10 NOTE — Progress Notes (Signed)
Patient ID: Alicia Coffey, female   DOB: [birth year], 46 y.o.   MRN: [id number]    This patient presents for scheduled visit complaining of uncomfortable toenails walking wearing shoes and request toenail debridement. Patient is a diabetic denies history of skin ulceration claudication or amputation. Patient says she has a history of neuropathy Patient is history of falls and very unsteady gait Patient interested in diabetic shoes  Objective: Orientated 3 DP pulses 2/4 bilaterally PT pulses 1/4 bilaterally Capillary reflex immediate bilaterally Sensation to 10 g monofilament wire intact 2/5 bilaterally Vibratory sensation reactive right nonreactive left Ankle reflexes reactive bilaterally Toenails elongated, hypertrophic, discolored 6-10 with tenderness to direct palpation Hammertoes 2-5 bilaterally Manual motor testing dorsi flexion, plantar flexion, inversion, eversion 5/5 bilaterally Patient walks slowly with cane  Assessment: Diabetic peripheral neuropathy  Symptomatic onychomycoses 6-10 Gait disturbance  Plan: Debridement toenails 6-10 mechanically and electrical without any bleeding. Check for benefits for diabetic shoes and notify patient  Reappoint 3 months

## 2017-05-18 ENCOUNTER — Ambulatory Visit (INDEPENDENT_AMBULATORY_CARE_PROVIDER_SITE_OTHER): Payer: Medicaid Other | Admitting: Podiatry

## 2017-05-18 ENCOUNTER — Encounter: Payer: Self-pay | Admitting: Podiatry

## 2017-05-18 DIAGNOSIS — M79676 Pain in unspecified toe(s): Secondary | ICD-10-CM

## 2017-05-18 DIAGNOSIS — E1142 Type 2 diabetes mellitus with diabetic polyneuropathy: Secondary | ICD-10-CM

## 2017-05-18 DIAGNOSIS — B351 Tinea unguium: Secondary | ICD-10-CM | POA: Diagnosis not present

## 2017-05-18 DIAGNOSIS — M79609 Pain in unspecified limb: Principal | ICD-10-CM

## 2017-05-18 NOTE — Progress Notes (Signed)
Patient ID: Alicia Coffey, female   DOB: 06/09/1971, 46 y.o.   MRN: [id number]    This patient presents for scheduled visit complaining of uncomfortable toenails walking wearing shoes and request toenail debridement. Patient is a diabetic denies history of skin ulceration claudication or amputation. Patient says she has a history of neuropathy Patient is history of falls and very unsteady gait Patient interested in diabetic shoes  Objective: Orientated 3 DP pulses 2/4 bilaterally PT pulses 1/4 bilaterally Capillary reflex immediate bilaterally Sensation to 10 g monofilament wire intact 2/5 bilaterally Vibratory sensation reactive right nonreactive left Ankle reflexes reactive bilaterally Toenails elongated, hypertrophic, discolored 6-10 with tenderness to direct palpation Hammertoes 2-5 bilaterally Manual motor testing dorsi flexion, plantar flexion, inversion, eversion 5/5 bilaterally Patient walks slowly with cane  Assessment: Diabetic peripheral neuropathy  Symptomatic onychomycoses 6-10 Gait disturbance  Plan: Debridement toenails 6-10 mechanically and electrical without anybleeding. Check for benefits for diabetic shoes and notify patient pending  Reappoint 3 months   

## 2017-05-18 NOTE — Patient Instructions (Signed)

## 2017-06-10 ENCOUNTER — Ambulatory Visit (HOSPITAL_COMMUNITY)
Admission: EM | Admit: 2017-06-10 | Discharge: 2017-06-10 | Disposition: A | Discharge status: Home / Self Care | Payer: Medicaid Other | Attending: Emergency Medicine | Admitting: Emergency Medicine

## 2017-06-10 ENCOUNTER — Ambulatory Visit (INDEPENDENT_AMBULATORY_CARE_PROVIDER_SITE_OTHER): Payer: Medicaid Other

## 2017-06-10 ENCOUNTER — Encounter (HOSPITAL_COMMUNITY): Payer: Self-pay | Admitting: Emergency Medicine

## 2017-06-10 DIAGNOSIS — S63634A Sprain of interphalangeal joint of right ring finger, initial encounter: Secondary | ICD-10-CM

## 2017-06-10 DIAGNOSIS — S62664A Nondisplaced fracture of distal phalanx of right ring finger, initial encounter for closed fracture: Secondary | ICD-10-CM

## 2017-06-10 NOTE — ED Provider Notes (Signed)
[place] [facility]    CSN: [id number] Arrival date & time: 06/10/17  6606     History   Chief Complaint Chief Complaint  Patient presents with  . Finger Injury    right ring    HPI Alicia Coffey is a 46 y.o. female.   46 year old obese female states that she was attempting to get out of a bus lost her step and she attempted to grab the railing with her right hand and in so doing so injured her right ring finger. She believes she may have hyperextended it but is uncertain. This occurred 4 days ago when she is still having some soreness. Denies other injury.      Past Medical History:  Diagnosis Date  . Anemia   . Anxiety   . Arthritis    right knee  . Cancer ([place])   . Depression   . Diabetes mellitus    IDDM  . Disc degeneration, lumbar   . Fibromyalgia   . Hyperlipidemia   . Hypertension    under control, has been on med. > 1 yr.  [place] body movements    while awake and asleep  . 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 Shortness of breath dyspnea    with exertion  . Vertigo 2015  . Wears dentures    upper  . Wears partial dentures    lower    Patient Active Problem List   Diagnosis Date Noted  . Menorrhagia with irregular cycle 07/25/2014  . Iron deficiency anemia due to chronic blood loss 07/25/2014  . Follow up 05/23/2014  . Fibroid, uterine 04/04/2014  . Abnormal uterine bleeding (AUB) 03/26/2014  . Diabetic peripheral neuropathy ([place]) 12/21/2013  . Low back pain 12/11/2013  . Pain in joint, shoulder region 12/11/2013  . Pain in limb 12/11/2013  . 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 Diabetes ([place]) 07/28/2012  . Hypertension 07/28/2012  . Hypercholesteremia 07/28/2012  . 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 BREAST BIOPSY Left   . BREAST LUMPECTOMY WITH NEEDLE LOCALIZATION  08/15/2012   Procedure: BREAST LUMPECTOMY WITH NEEDLE LOCALIZATION;  Surgeon: Haywood Lasso, MD;  Location: [facility];  Service: General;  Laterality: Right;  Wire localizations Right breast calcifications  . BREAST SURGERY    . DILITATION & CURRETTAGE/HYSTROSCOPY WITH THERMACHOICE ABLATION N/A 08/21/2014   Procedure: DILATATION & CURETTAGE/HYSTEROSCOPY WITH THERMACHOICE ABLATION;  Surgeon: Jonnie Kind, MD;  Location: [facility];  Service: Gynecology;  Laterality: N/A;  . KNEE ARTHROSCOPY Left 10/16/2015   Procedure: ARTHROSCOPY KNEE with debridment;  Surgeon: Frederik Pear, MD;  Location: [facility];  Service: Orthopedics;  Laterality: Left;  . KNEE ARTHROSCOPY WITH MEDIAL MENISECTOMY Right 07/17/2015   Procedure: KNEE ARTHROSCOPY WITH MEDIAL MENISECTOMY;  Surgeon: Frederik Pear, MD;  Location: [facility];  Service: Orthopedics;  Laterality: Right;  . POLYPECTOMY N/A 08/21/2014   Procedure: ENDOMETRIAL POLYPECTOMY;  Surgeon: Jonnie Kind, MD;  Location: [facility];  Service: Gynecology;  Laterality: N/A;  . REFRACTIVE SURGERY Right    micro aneuysms    OB History    Gravida Para Term Preterm AB Living   0             SAB TAB Ectopic Multiple Live Births  Home Medications    Prior to Admission medications   Medication Sig Start Date End Date Taking? Authorizing Provider  albuterol (PROVENTIL HFA;VENTOLIN HFA) 108 (90 Base) MCG/ACT inhaler Inhale 2 puffs into the lungs every 6 (six) hours as needed for wheezing or shortness of breath.    Yes [provider]  amitriptyline (ELAVIL) 75 MG tablet Take 75 mg by mouth at bedtime.   Yes [provider]  benazepril (LOTENSIN) 20 MG tablet Take 20 mg by mouth daily.    Yes [provider]  bumetanide (BUMEX) 0.5 MG tablet Take 0.5-1 mg by mouth 2 (two) times daily.   Yes [provider]  docusate sodium (COLACE) 100 MG capsule Take 100 mg by mouth daily.   Yes  [provider]  DULoxetine (CYMBALTA) 60 MG capsule Take 1 capsule (60 mg total) by mouth daily. 12/14/13  Yes Marcial Pacas, MD  fish oil-omega-3 fatty acids 1000 MG capsule Take 1 g by mouth at bedtime.    Yes [provider]  glipiZIDE (GLUCOTROL) 5 MG tablet Take 5 mg by mouth daily before breakfast.    Yes [provider]  hydrOXYzine (ATARAX/VISTARIL) 25 MG tablet Take 25 mg by mouth at bedtime.   Yes [provider]  insulin glargine (LANTUS) 100 UNIT/ML injection Inject 0.28 mLs (28 Units total) into the skin at bedtime. 09/17/16  Yes Nicholas Lose, MD  Iron-FA-B Cmp-C-Biot-Probiotic (FUSION PLUS) CAPS Take 1 capsule by mouth daily.    Yes [provider]  Magnesium 250 MG TABS Take 250 mg by mouth 2 (two) times daily.   Yes [provider]  methocarbamol (ROBAXIN) 500 MG tablet Take 500 mg by mouth 2 (two) times daily.    Yes [provider]  metoprolol succinate (TOPROL-XL) 25 MG 24 hr tablet Take 25 mg by mouth every morning.   Yes [provider]  rosuvastatin (CRESTOR) 40 MG tablet Take 40 mg by mouth daily.   Yes [provider]  sitaGLIPtin-metformin (JANUMET) 50-1000 MG tablet Take 1 tablet by mouth 2 (two) times daily with a meal.   Yes [provider]  hydrocortisone cream 1 % Apply to affected area 2 times daily 08/12/15   Gloriann Loan, PA-C  Insulin Lispro Prot & Lispro (HUMALOG MIX 50/50 KWIKPEN) (50-50) 100 UNIT/ML Kwikpen Inject 3-10 Units into the skin 3 (three) times daily with meals. Sliding scale    [provider]  Melatonin 10 MG TABS Take 10 mg by mouth at bedtime.    [provider]  oxymetazoline (AFRIN NASAL SPRAY) 0.05 % nasal spray Place 1 spray into both nostrils 2 (two) times daily. 03/25/16   Nona Dell, PA-C  tamoxifen (NOLVADEX) 20 MG tablet Take 1 tablet (20 mg total) by mouth daily. 09/17/16   Nicholas Lose, MD  traMADol (ULTRAM) 50 MG tablet  Take 50 mg by mouth every 6 (six) hours as needed for moderate pain.     [provider]  triamcinolone (KENALOG) 0.025 % cream Apply 1 application topically 2 (two) times daily.    [provider]    Family History Family History  Problem Relation Age of Onset  . Breast cancer Paternal Grandmother 63       breast; dbl mastectomy  . Diabetes Father   . Heart disease Father   . Hypertension Father   . Heart attack Father   . Diabetes Mother   . Heart disease Mother   . Hypertension Mother   .  Diabetes Sister   . Fibromyalgia Sister   . Diabetes Sister   . Heart attack Maternal Grandmother        <35  . Heart attack Maternal Grandfather   . Heart attack Paternal Grandfather   . Diabetes Brother     Social History Social History  Substance Use Topics  . Smoking status: Never Smoker  . Smokeless tobacco: Never Used  . Alcohol use No     Allergies   Other; Penicillins; and Seasonal ic [cholestatin]   Review of Systems Review of Systems  Constitutional: Negative for activity change, chills and fever.  HENT: Negative.   Respiratory: Negative.   Cardiovascular: Negative.   Musculoskeletal:       As per HPI  Skin: Negative for color change, pallor and rash.  Neurological: Negative.   All other systems reviewed and are negative.    Physical Exam Triage Vital Signs ED Triage Vitals  Enc Vitals Group     BP 06/10/17 1015 (!) 140/110     Pulse Rate 06/10/17 1015 (!) 103     Resp 06/10/17 1015 18     Temp 06/10/17 1015 98.5 F (36.9 C)     Temp Source 06/10/17 1015 Oral     SpO2 06/10/17 1015 97 %     Weight --      Height --      Head Circumference --      Peak Flow --      Pain Score 06/10/17 1016 1     Pain Loc --      Pain Edu? --      Excl. in [place]? --    No data found.   Updated Vital Signs BP (!) 140/110 (BP Location: Left Arm) Comment: reported elevated BP to nurse Kalman Shan.   Pulse (!) 103 Comment: reported HR to nurse  [facility]  Temp 98.5 F (36.9 C) (Oral)   Resp 18   LMP 05/17/2017   SpO2 97%   Visual Acuity Right Eye Distance:   Left Eye Distance:   Bilateral Distance:    Right Eye Near:   Left Eye Near:    Bilateral Near:     Physical Exam  Constitutional: She is oriented to person, place, and time. She appears well-developed and well-nourished. No distress.  HENT:  Head: Normocephalic and atraumatic.  Eyes: EOM are normal.  Neck: Normal range of motion. Neck supple.  Pulmonary/Chest: Effort normal.  Musculoskeletal: Normal range of motion. She exhibits tenderness. She exhibits no edema or deformity.  Right ring finger with no deformity. No appreciable swelling. Positive for tenderness along the middle and distal phalanx as well as the DIP. Again no swelling. She is able to flex and extend normally. Able to make a fist. Distal neurovascular motor Sentry is grossly intact.  Neurological: She is alert and oriented to person, place, and time. No cranial nerve deficit.  Skin: Skin is warm and dry.  Psychiatric: She has a normal mood and affect.  Nursing note and vitals reviewed.    UC Treatments / Results  Labs (all labs ordered are listed, but only abnormal results are displayed) Labs Reviewed - No data to display  EKG  EKG Interpretation None       Radiology Dg Hand Complete Right  Result Date: 06/10/2017 CLINICAL DATA:  Jamming injury of the right fourth finger 4 days ago during a fall. History of diabetes. EXAM: RIGHT HAND - COMPLETE 3+ VIEW COMPARISON:  Right hand series of July 25, 2008 FINDINGS: The bones of the right hand are subjectively adequately mineralized. There is subtle lucency noted through the ventral aspect of the base of the distal phalanx of the fourth finger. This could reflect a minimally distracted fracture at the insertion of the flexor tendon. However, there is no significant overlying soft tissue swelling. Elsewhere no bony abnormalities are  observed. IMPRESSION: Possible minimally distracted avulsion fracture from the ventral aspect of the base of the distal phalanx of the right fourth finger. Correlation with the site of the patient's symptoms is needed. Electronically Signed   By: Athens Lebeau  [place] M.D.   On: 06/10/2017 10:45    Procedures Procedures (including critical care time)  Medications Ordered in UC Medications - No data to display   Initial Impression / Assessment and Plan / UC Course  I have reviewed the triage vital signs and the nursing notes.  Pertinent labs & imaging results that were available during my care of the patient were reviewed by me and considered in my medical decision making (see chart for details).     Wear splint at most times. Follow-up with the hand surgeon next week. Keep it elevated to limit swelling.   Final Clinical Impressions(s) / UC Diagnoses   Final diagnoses:  Sprain of interphalangeal joint of right ring finger, initial encounter  Closed nondisplaced fracture of distal phalanx of right ring finger, initial encounter    New Prescriptions Current Discharge Medication List       Controlled Substance Prescriptions [place] Controlled Substance Registry consulted? Not Applicable   Janne Napoleon, NP 06/10/17 1101

## 2017-06-10 NOTE — ED Triage Notes (Signed)
Pt reports falling out of a van on Sunday.  She went to reach up to catch herself and injured her right ring finger.  Pt has a burning pain and a bump on the finger.

## 2017-06-10 NOTE — Discharge Instructions (Signed)
Wear splint at most times. Follow-up with the hand surgeon next week. Keep it elevated to limit swelling.

## 2017-06-15 DIAGNOSIS — M79644 Pain in right finger(s): Secondary | ICD-10-CM | POA: Insufficient documentation

## 2017-06-15 DIAGNOSIS — S63634A Sprain of interphalangeal joint of right ring finger, initial encounter: Secondary | ICD-10-CM | POA: Insufficient documentation

## 2017-08-23 ENCOUNTER — Ambulatory Visit (INDEPENDENT_AMBULATORY_CARE_PROVIDER_SITE_OTHER): Payer: Medicaid Other | Admitting: Podiatry

## 2017-08-23 ENCOUNTER — Encounter: Payer: Self-pay | Admitting: Podiatry

## 2017-08-23 DIAGNOSIS — B351 Tinea unguium: Secondary | ICD-10-CM

## 2017-08-23 DIAGNOSIS — M79609 Pain in unspecified limb: Secondary | ICD-10-CM

## 2017-08-23 DIAGNOSIS — E1142 Type 2 diabetes mellitus with diabetic polyneuropathy: Secondary | ICD-10-CM | POA: Diagnosis not present

## 2017-08-23 NOTE — Patient Instructions (Signed)

## 2017-08-23 NOTE — Progress Notes (Signed)
Patient ID: Alicia Coffey, female   DOB: 1971-02-08, 46 y.o.   MRN: [id number]    This patient presents for scheduled visit complaining of uncomfortable toenails walking wearing shoes and request toenail debridement. Patient is a diabetic denies history of skin ulceration claudication or amputation. Patient says she has a history of neuropathy Patient is history of falls and very unsteady gait Patient interested in diabetic shoes  Objective: Orientated 3 DP pulses 2/4 bilaterally PT pulses 1/4 bilaterally Capillary reflex immediate bilaterally Sensation to 10 g monofilament wire intact 2/5 bilaterally Vibratory sensation reactive right nonreactive left Ankle reflexes reactive bilaterally Toenails elongated, hypertrophic, discolored 6-10 with tenderness to direct palpation Hammertoes 2-5 bilaterally Manual motor testing dorsi flexion, plantar flexion, inversion, eversion 5/5 bilaterally Patient walks slowly with cane  Assessment: Diabetic peripheral neuropathy  Symptomatic onychomycoses 6-10 Gait disturbance  Plan: Debridement toenails 6-10 mechanically and electrical without anybleeding. Check for benefits for diabetic shoes and notify patient pending  Reappoint 3 months

## 2017-09-16 ENCOUNTER — Telehealth: Payer: Self-pay | Admitting: Hematology and Oncology

## 2017-09-16 ENCOUNTER — Ambulatory Visit (HOSPITAL_BASED_OUTPATIENT_CLINIC_OR_DEPARTMENT_OTHER): Payer: Medicaid Other | Admitting: Hematology and Oncology

## 2017-09-16 DIAGNOSIS — D0501 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: Secondary | ICD-10-CM

## 2017-09-16 DIAGNOSIS — Z86000 Personal history of in-situ neoplasm of breast: Secondary | ICD-10-CM

## 2017-09-16 MED ORDER — EXENATIDE ER 2 MG ~~LOC~~ PEN: 1.0000 | PEN_INJECTOR | SUBCUTANEOUS | Status: DC

## 2017-09-16 NOTE — Telephone Encounter (Signed)
Gave patient AVS and calendar of upcoming December 2019 appointments.  °

## 2017-09-16 NOTE — Assessment & Plan Note (Signed)
Right breast LCIS: Status post lumpectomy 08/15/2012 and took tamoxifen 20 mg daily since December 2013-December 2017  Surveillance: 1. Breast exam 09/16/2017: No palpable lumps or nodules of concern 2. Mammogram 01/14/2017 No abnormalities. Postsurgical changes. Breast Density Category C  Return to clinic in 1 year for follow-up with survivorship clinic

## 2017-09-16 NOTE — Progress Notes (Signed)
Patient Care Team: Vonna Drafts, FNP as PCP - General (Nurse Practitioner)  DIAGNOSIS:  Encounter Diagnosis  Name Primary?  . 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  If she has had multiple health issues including worsening arthritis, worsening neuropathy from diabetes as well as recent upper arm numbness as well.  She denies any lumps or nodules in the breast.  REVIEW OF SYSTEMS:   Constitutional: Denies fevers, chills or abnormal weight loss Eyes: Denies blurriness of vision Ears, nose, mouth, throat, and face: Denies mucositis or sore throat Respiratory: Denies cough, dyspnea or wheezes Cardiovascular: Denies palpitation, chest discomfort Gastrointestinal:  Denies nausea, heartburn or change in bowel habits Skin: Denies abnormal skin rashes Lymphatics: Denies new lymphadenopathy or easy bruising Neurological:Denies numbness, tingling or new weaknesses Behavioral/Psych: Mood is stable, no new changes  Extremities: No lower extremity edema Breast:  denies any pain or lumps or nodules in either breasts All other systems were reviewed with the patient and are negative.  I have reviewed the past medical history, past surgical history, social history and family history with the patient and they are unchanged from previous note.  ALLERGIES:  is allergic to other; penicillins; and seasonal ic [cholestatin].  MEDICATIONS:  Current Outpatient Medications  Medication Sig Dispense Refill  . albuterol (PROVENTIL HFA;VENTOLIN HFA) 108 (90 Base) MCG/ACT inhaler Inhale 2 puffs into the lungs every 6 (six) hours as needed for wheezing or shortness of breath.     Marland Kitchen amitriptyline (ELAVIL) 75 MG tablet Take 75 mg by mouth at bedtime.    . benazepril (LOTENSIN) 20 MG  tablet Take 20 mg by mouth daily.     . bumetanide (BUMEX) 0.5 MG tablet Take 0.5-1 mg by mouth 2 (two) times daily.    Marland Kitchen docusate sodium (COLACE) 100 MG capsule Take 100 mg by mouth daily.    . DULoxetine (CYMBALTA) 60 MG capsule Take 1 capsule (60 mg total) by mouth daily. 30 capsule 12  . fish oil-omega-3 fatty acids 1000 MG capsule Take 1 g by mouth at bedtime.     Marland Kitchen glipiZIDE (GLUCOTROL) 5 MG tablet Take 5 mg by mouth daily before breakfast.     . hydrocortisone cream 1 % Apply to affected area 2 times daily 15 g 0  . hydrOXYzine (ATARAX/VISTARIL) 25 MG tablet Take 25 mg by mouth at bedtime.    . insulin glargine (LANTUS) 100 UNIT/ML injection Inject 0.28 mLs (28 Units total) into the skin at bedtime. 10 mL   . Insulin Lispro Prot & Lispro (HUMALOG MIX 50/50 KWIKPEN) (50-50) 100 UNIT/ML Kwikpen Inject 3-10 Units into the skin 3 (three) times daily with meals. Sliding scale    . Iron-FA-B Cmp-C-Biot-Probiotic (FUSION PLUS) CAPS Take 1 capsule by mouth daily.     . Magnesium 250 MG TABS Take 250 mg by mouth 2 (two) times daily.    . Melatonin 10 MG TABS Take 10 mg by mouth at bedtime.    . methocarbamol (ROBAXIN) 500 MG tablet Take 500 mg by mouth 2 (two) times daily.     . metoprolol succinate (TOPROL-XL) 25 MG 24 hr tablet Take 25 mg by mouth every morning.    Marland Kitchen oxymetazoline (AFRIN NASAL SPRAY) 0.05 % nasal spray Place 1 spray into both nostrils 2 (two) times  daily. 30 mL 0  . rosuvastatin (CRESTOR) 40 MG tablet Take 40 mg by mouth daily.    . sitaGLIPtin-metformin (JANUMET) 50-1000 MG tablet Take 1 tablet by mouth 2 (two) times daily with a meal.    . tamoxifen (NOLVADEX) 20 MG tablet Take 1 tablet (20 mg total) by mouth daily. 90 tablet 3  . traMADol (ULTRAM) 50 MG tablet Take 50 mg by mouth every 6 (six) hours as needed for moderate pain.     Marland Kitchen triamcinolone (KENALOG) 0.025 % cream Apply 1 application topically 2 (two) times daily.     No current facility-administered medications for  this visit.     PHYSICAL EXAMINATION: ECOG PERFORMANCE STATUS: 1 - Symptomatic but completely ambulatory  Vitals:   09/16/17 1422  BP: 133/82  Pulse: (!) 103  Resp: (!) 22  Temp: 98.6 F (37 C)  SpO2: 99%   Filed Weights   09/16/17 1422  Weight: 256 lb 8 oz (116.3 kg)    GENERAL:alert, no distress and comfortable SKIN: skin color, texture, turgor are normal, no rashes or significant lesions EYES: normal, Conjunctiva are pink and non-injected, sclera clear OROPHARYNX:no exudate, no erythema and lips, buccal mucosa, and tongue normal  NECK: supple, thyroid normal size, non-tender, without nodularity LYMPH:  no palpable lymphadenopathy in the cervical, axillary or inguinal LUNGS: clear to auscultation and percussion with normal breathing effort HEART: regular rate & rhythm and no murmurs and no lower extremity edema ABDOMEN:abdomen soft, non-tender and normal bowel sounds MUSCULOSKELETAL:no cyanosis of digits and no clubbing  NEURO: alert & oriented x 3 with fluent speech, no focal motor/sensory deficits EXTREMITIES: No lower extremity edema BREAST: No palpable masses or nodules in either right or left breasts. No palpable axillary supraclavicular or infraclavicular adenopathy no breast tenderness or nipple discharge. (exam performed in the presence of a chaperone)  LABORATORY DATA:  I have reviewed the data as listed   Chemistry      Component Value Date/Time   NA 137 05/24/2016 1503   NA 139 09/17/2015 1019   K 4.0 05/24/2016 1503   K 4.4 09/17/2015 1019   CL 101 05/24/2016 1503   CL 106 01/09/2013 0855   CO2 27 05/24/2016 1503   CO2 25 09/17/2015 1019   BUN 26 (H) 05/24/2016 1503   BUN 23.6 09/17/2015 1019   CREATININE 1.37 (H) 05/24/2016 1503   CREATININE 1.2 (H) 09/17/2015 1019      Component Value Date/Time   CALCIUM 8.6 (L) 05/24/2016 1503   CALCIUM 9.3 09/17/2015 1019   ALKPHOS 60 09/17/2015 1019   AST 22 09/17/2015 1019   ALT 15 09/17/2015 1019    BILITOT 0.33 09/17/2015 1019       Lab Results  Component Value Date   WBC 11.2 (H) 05/24/2016   HGB 11.7 (L) 05/24/2016   HCT 36.6 05/24/2016   MCV 87.1 05/24/2016   PLT 189 05/24/2016   NEUTROABS 5.3 01/15/2016    ASSESSMENT & PLAN:  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 Breast exam 09/16/2017: No palpable lumps or nodules of concern 2. Mammogram 01/14/2017 No abnormalities. Postsurgical changes. Breast Density Category C  Return to clinic in 1 year for follow-up with survivorship clinic  I spent 25 minutes talking to the patient of which more than half was spent in counseling and coordination of care.  No orders of the defined types were  placed in this encounter.  The patient has a good understanding of the overall plan. she agrees with it. she will call with any problems that may develop before the next visit here.   Rulon Eisenmenger, MD 09/16/17

## 2017-10-01 ENCOUNTER — Encounter (HOSPITAL_COMMUNITY): Payer: Self-pay

## 2017-10-04 ENCOUNTER — Other Ambulatory Visit: Payer: Self-pay

## 2017-10-04 ENCOUNTER — Emergency Department (HOSPITAL_COMMUNITY)
Admission: EM | Admit: 2017-10-04 | Discharge: 2017-10-05 | Disposition: A | Discharge status: Home / Self Care | Payer: Medicaid Other | Attending: Emergency Medicine | Admitting: Emergency Medicine

## 2017-10-04 ENCOUNTER — Encounter (HOSPITAL_COMMUNITY): Payer: Self-pay

## 2017-10-04 ENCOUNTER — Emergency Department (HOSPITAL_COMMUNITY): Payer: Medicaid Other

## 2017-10-04 DIAGNOSIS — J029 Acute pharyngitis, unspecified: Secondary | ICD-10-CM | POA: Diagnosis not present

## 2017-10-04 DIAGNOSIS — Z79899 Other long term (current) drug therapy: Secondary | ICD-10-CM | POA: Diagnosis not present

## 2017-10-04 DIAGNOSIS — R509 Fever, unspecified: Secondary | ICD-10-CM | POA: Diagnosis present

## 2017-10-04 DIAGNOSIS — I10 Essential (primary) hypertension: Secondary | ICD-10-CM | POA: Diagnosis not present

## 2017-10-04 DIAGNOSIS — J3489 Other specified disorders of nose and nasal sinuses: Secondary | ICD-10-CM | POA: Insufficient documentation

## 2017-10-04 DIAGNOSIS — R05 Cough: Secondary | ICD-10-CM | POA: Diagnosis not present

## 2017-10-04 DIAGNOSIS — Z853 Personal history of malignant neoplasm of breast: Secondary | ICD-10-CM | POA: Diagnosis not present

## 2017-10-04 DIAGNOSIS — Z794 Long term (current) use of insulin: Secondary | ICD-10-CM | POA: Insufficient documentation

## 2017-10-04 DIAGNOSIS — Z880 Allergy status to penicillin: Secondary | ICD-10-CM | POA: Insufficient documentation

## 2017-10-04 DIAGNOSIS — E1140 Type 2 diabetes mellitus with diabetic neuropathy, unspecified: Secondary | ICD-10-CM | POA: Diagnosis not present

## 2017-10-04 DIAGNOSIS — R000 Tachycardia, unspecified: Secondary | ICD-10-CM | POA: Insufficient documentation

## 2017-10-04 LAB — CBG MONITORING, ED: Glucose-Capillary: 291 mg/dL — ABNORMAL HIGH (ref 65–99)

## 2017-10-04 NOTE — ED Triage Notes (Signed)
Sore throat and fever x 2 days, no fever meds taken.

## 2017-10-05 LAB — RAPID STREP SCREEN (MED CTR MEBANE ONLY): Streptococcus, Group A Screen (Direct): NEGATIVE

## 2017-10-05 MED ORDER — ACETAMINOPHEN 500 MG PO TABS
1000.0000 mg | ORAL_TABLET | Freq: Once | ORAL | Status: AC
  Administered 2017-10-05: 1000 mg via ORAL
  Filled 2017-10-05: qty 2

## 2017-10-05 MED ORDER — CLINDAMYCIN HCL 150 MG PO CAPS
300.0000 mg | ORAL_CAPSULE | Freq: Once | ORAL | Status: AC
  Administered 2017-10-05: 300 mg via ORAL
  Filled 2017-10-05: qty 2

## 2017-10-05 MED ORDER — CLINDAMYCIN HCL 300 MG PO CAPS: 300.0000 mg | ORAL_CAPSULE | Freq: Four times a day (QID) | ORAL | 0 refills | Status: AC

## 2017-10-05 NOTE — Discharge Instructions (Signed)
Drink plenty of fluids. Take the clindamycin 4 times a day for 10 days.  You can take ibuprofen 600 mg plus acetaminophen 1000 mg every 6 hours as needed for pain or fever.  Recheck if you get a high fever, are unable to swallow, have difficulty breathing, feel like you are getting dehydrated or seem worse.

## 2017-10-05 NOTE — ED Provider Notes (Signed)
[facility] EMERGENCY DEPARTMENT Provider Note   CSN: [id number] Arrival date & time: 10/04/17  2314  Time seen 23:38 PM   History   Chief Complaint Chief Complaint  Patient presents with  . Fever  . Sore Throat    HPI Alicia Coffey is a 46 y.o. female.  HPI patient states yesterday she started getting a sore throat and states it hurts to swallow.  She states it hurts more on the left than the right.  Her fever at home is been up to 101.9.  She states she feels cold and is having some mild chills.  She states she has had a mild dry cough for the past 1/2 weeks.  She has sneezing and sometimes has clear rhinorrhea.  She takes an injection every week on December 23 was her last dose of Bydureon and she usually has some nausea and vomiting afterwards which she did.  This is not unusual.  She denies any diarrhea.  She denies being around anybody else who is ill.  She states she had strep once before several years ago and had a reaction to penicillin.  PCP Vonna Drafts, FNP   Past Medical History:  Diagnosis Date  . Anemia   . Anxiety   . Arthritis    right knee  . Cancer ([place])   . Depression   . Diabetes mellitus    IDDM  . Disc degeneration, lumbar   . Fibromyalgia   . Hyperlipidemia   . Hypertension    under control, has been on med. > 1 yr.  [place] body movements    while awake and asleep  . 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 Shortness of breath dyspnea    with exertion  . Vertigo 2015  . Wears dentures    upper  . Wears partial dentures    lower    Patient Active Problem List   Diagnosis Date Noted  . Menorrhagia with irregular cycle 07/25/2014  . Iron deficiency anemia due to chronic blood loss 07/25/2014  . Follow up 05/23/2014  . Fibroid, uterine 04/04/2014  . Abnormal uterine bleeding (AUB) 03/26/2014  . Diabetic peripheral neuropathy ([place]) 12/21/2013  . Low back pain 12/11/2013    . Pain in joint, shoulder region 12/11/2013  . Pain in limb 12/11/2013  . 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 Diabetes ([place]) 07/28/2012  . Hypertension 07/28/2012  . Hypercholesteremia 07/28/2012  . 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 BREAST BIOPSY Left   . BREAST LUMPECTOMY WITH NEEDLE LOCALIZATION  08/15/2012   Procedure: BREAST LUMPECTOMY WITH NEEDLE LOCALIZATION;  Surgeon: Haywood Lasso, MD;  Location: [facility];  Service: General;  Laterality: Right;  Wire localizations Right breast calcifications  . BREAST SURGERY    . DILITATION & CURRETTAGE/HYSTROSCOPY WITH THERMACHOICE ABLATION N/A 08/21/2014   Procedure: DILATATION & CURETTAGE/HYSTEROSCOPY WITH THERMACHOICE ABLATION;  Surgeon: Jonnie Kind, MD;  Location: [facility];  Service: Gynecology;  Laterality: N/A;  . KNEE ARTHROSCOPY Left 10/16/2015   Procedure: ARTHROSCOPY KNEE with debridment;  Surgeon: Frederik Pear, MD;  Location: [place] [facility];  Service: Orthopedics;  Laterality: Left;  . KNEE ARTHROSCOPY WITH MEDIAL MENISECTOMY Right 07/17/2015   Procedure: KNEE ARTHROSCOPY WITH MEDIAL MENISECTOMY;  Surgeon: Frederik Pear, MD;  Location: [facility];  Service: Orthopedics;  Laterality:  Right;  Marland Kitchen POLYPECTOMY N/A 08/21/2014   Procedure: ENDOMETRIAL POLYPECTOMY;  Surgeon: Jonnie Kind, MD;  Location: [facility];  Service: Gynecology;  Laterality: N/A;  . REFRACTIVE SURGERY Right    micro aneuysms    OB History    Gravida Para Term Preterm AB Living   0             SAB TAB Ectopic Multiple Live Births                   Home Medications    Prior to Admission medications   Medication Sig Start Date End Date Taking? Authorizing Provider  albuterol (PROVENTIL HFA;VENTOLIN HFA) 108 (90 Base) MCG/ACT inhaler Inhale 2 puffs into the lungs every 6 (six) hours as needed for wheezing or shortness of  breath.     [provider]  amitriptyline (ELAVIL) 75 MG tablet Take 75 mg by mouth at bedtime.    [provider]  benazepril (LOTENSIN) 20 MG tablet Take 20 mg by mouth daily.     [provider]  bumetanide (BUMEX) 0.5 MG tablet Take 0.5-1 mg by mouth 2 (two) times daily.    [provider]  clindamycin (CLEOCIN) 300 MG capsule Take 1 capsule (300 mg total) by mouth 4 (four) times daily for 10 days. 10/05/17 10/15/17  Rolland Porter, MD  docusate sodium (COLACE) 100 MG capsule Take 100 mg by mouth daily.    [provider]  DULoxetine (CYMBALTA) 60 MG capsule Take 1 capsule (60 mg total) by mouth daily. 12/14/13   Marcial Pacas, MD  Exenatide ER (BYDUREON) 2 MG PEN Inject 1 Dose into the skin once a week. 09/16/17   Nicholas Lose, MD  fish oil-omega-3 fatty acids 1000 MG capsule Take 1 g by mouth at bedtime.     [provider]  hydrocortisone cream 1 % Apply to affected area 2 times daily 08/12/15   Gloriann Loan, PA-C  hydrOXYzine (ATARAX/VISTARIL) 25 MG tablet Take 25 mg by mouth at bedtime.    [provider]  insulin glargine (LANTUS) 100 UNIT/ML injection Inject 0.28 mLs (28 Units total) into the skin at bedtime. 09/17/16   Nicholas Lose, MD  Insulin Lispro Prot & Lispro (HUMALOG MIX 50/50 KWIKPEN) (50-50) 100 UNIT/ML Kwikpen Inject 3-10 Units into the skin 3 (three) times daily with meals. Sliding scale    [provider]  Iron-FA-B Cmp-C-Biot-Probiotic (FUSION PLUS) CAPS Take 1 capsule by mouth daily.     [provider]  Magnesium 250 MG TABS Take 250 mg by mouth 2 (two) times daily.    [provider]  methocarbamol (ROBAXIN) 500 MG tablet Take 500 mg by mouth 2 (two) times daily.     [provider]  metoprolol succinate (TOPROL-XL) 25 MG 24 hr tablet Take 25 mg by mouth every morning.    [provider]  rosuvastatin (CRESTOR) 40 MG tablet Take 40 mg by mouth daily.    [provider]  sitaGLIPtin-metformin (JANUMET) 50-1000 MG tablet Take 1 tablet by mouth 2 (two) times daily with a meal.    [provider]  traMADol (ULTRAM) 50 MG tablet Take 50 mg by mouth every 6 (six) hours as needed for moderate pain.     [provider]  triamcinolone (KENALOG) 0.025 % cream Apply 1 application topically 2 (two) times daily.    [provider]    Family History Family History  Problem Relation Age of Onset  . Breast  cancer Paternal Grandmother 9       breast; dbl mastectomy  . Diabetes Father   . Heart disease Father   . Hypertension Father   . Heart attack Father   . Diabetes Mother   . Heart disease Mother   . Hypertension Mother   . Diabetes Sister   . Fibromyalgia Sister   . Diabetes Sister   . Heart attack Maternal Grandmother        <35  . Heart attack Maternal Grandfather   . Heart attack Paternal Grandfather   . Diabetes Brother     Social History Social History   Tobacco Use  . Smoking status: Never Smoker  . Smokeless tobacco: Never Used  Substance Use Topics  . Alcohol use: No  . Drug use: No  Applying for disability   Allergies   Other; Penicillins; and Seasonal ic [cholestatin]   Review of Systems Review of Systems  All other systems reviewed and are negative.    Physical Exam Updated Vital Signs BP (!) 189/94 (BP Location: Left Arm)   Pulse (!) 117   Temp (!) 100.5 F (38.1 C) (Oral)   Resp 18   SpO2 93%   Vital signs normal except for hypertension and tachycardia with fever   Physical Exam  Constitutional: She is oriented to person, place, and time. She appears well-developed and well-nourished.  Non-toxic appearance. She does not appear ill. No distress.  HENT:  Head: Normocephalic and atraumatic.  Right Ear: External ear normal.  Left Ear: External ear normal.  Nose: Nose normal. No mucosal edema or rhinorrhea.  Mouth/Throat: Uvula is midline and mucous membranes are normal. No dental  abscesses or uvula swelling. Posterior oropharyngeal erythema present.  Patient has scattered petechiae on her posterior soft palate, there is no asymmetry of the soft palate.  Eyes: Conjunctivae and EOM are normal. Pupils are equal, round, and reactive to light.  Neck: Normal range of motion and full passive range of motion without pain. Neck supple.  Cardiovascular: Regular rhythm and normal heart sounds. Tachycardia present. Exam reveals no gallop and no friction rub.  No murmur heard. Pulmonary/Chest: Effort normal and breath sounds normal. No respiratory distress. She has no wheezes. She has no rhonchi. She has no rales. She exhibits no tenderness and no crepitus.  Abdominal: Soft. Normal appearance and bowel sounds are normal. She exhibits no distension. There is no tenderness. There is no rebound and no guarding.  Musculoskeletal: Normal range of motion. She exhibits no edema or tenderness.  Moves all extremities well.   Lymphadenopathy:    She has no cervical adenopathy.  Neurological: She is alert and oriented to person, place, and time. She has normal strength. No cranial nerve deficit.  Skin: Skin is warm, dry and intact. No rash noted. No erythema. No pallor.  Psychiatric: She has a normal mood and affect. Her speech is normal and behavior is normal. Her mood appears not anxious.  Nursing note and vitals reviewed.    ED Treatments / Results  Labs (all labs ordered are listed, but only abnormal results are displayed) Results for orders placed or performed during the hospital encounter of 10/04/17  Rapid strep screen  Result Value Ref Range   Streptococcus, Group A Screen (Direct) NEGATIVE NEGATIVE  CBG monitoring, ED  Result Value Ref Range   Glucose-Capillary 291 (H) 65 - 99 mg/dL   Laboratory interpretation all normal except hyperglycemia    EKG  EKG Interpretation None  Radiology Dg Chest 2 View  Result Date: 10/05/2017 CLINICAL DATA:  46 year old  female with cough. EXAM: CHEST  2 VIEW COMPARISON:  Chest radiograph dated 01/15/2016 FINDINGS: Diffuse peribronchial streaky densities may represent bronchitis or viral pneumonia. Clinical correlation is recommended. There is no focal consolidation, pleural effusion, or pneumothorax. The cardiac silhouette is within normal limits. No acute osseous pathology. IMPRESSION: No focal consolidation. Findings may represent bronchitis or viral infection. Clinical correlation is recommended. Electronically Signed   By: Anner Crete M.D.   On: 10/05/2017 00:25    Procedures Procedures (including critical care time)  Medications Ordered in ED Medications  clindamycin (CLEOCIN) capsule 300 mg (not administered)  acetaminophen (TYLENOL) tablet 1,000 mg (1,000 mg Oral Given 10/05/17 0128)     Initial Impression / Assessment and Plan / ED Course  I have reviewed the triage vital signs and the nursing notes.  Pertinent labs & imaging results that were available during my care of the patient were reviewed by me and considered in my medical decision making (see chart for details).    Chest x-ray and strep screen was done.  Patient CBG was also checked.  Patient was offered IV fluids but she states she is able to swallow.  After reviewing her labs, I felt like her throat was consistent with a possible strep infection with the petechiae on her soft palate.  She has a penicillin allergy that occurred the last time she was treated for strep.  She was started on clindamycin.    Final Clinical Impressions(s) / ED Diagnoses   Final diagnoses:  Sore throat    ED Discharge Orders        Ordered    clindamycin (CLEOCIN) 300 MG capsule  4 times daily     10/05/17 0207    OTC ibuprofen and acetaminophen  Plan discharge  Rolland Porter, MD, Barbette Or, MD 10/05/17 [phone]

## 2017-10-07 LAB — CULTURE, GROUP A STREP (THRC)

## 2017-10-08 ENCOUNTER — Telehealth: Payer: Self-pay | Admitting: Emergency Medicine

## 2017-10-08 NOTE — Telephone Encounter (Signed)
Post ED Visit - Positive Culture Follow-up  Culture report reviewed by antimicrobial stewardship pharmacist:  []  Elenor Quinones, Pharm.D. []  Heide Guile, Pharm.D., BCPS AQ-ID []  Parks Neptune, Pharm.D., BCPS []  Alycia Rossetti, Pharm.D., BCPS []  [place], Pharm.D., BCPS, AAHIVP [x]  Legrand Como, Pharm.D., BCPS, AAHIVP []  Salome Arnt, PharmD, BCPS []  Dimitri Ped, PharmD, BCPS []  Vincenza Hews, PharmD, BCPS  Positive strep culture Treated with clindamycin, organism sensitive to the same and no further patient follow-up is required at this time.  Hazle Nordmann 10/08/2017, 1:03 PM

## 2017-11-01 ENCOUNTER — Other Ambulatory Visit: Payer: Self-pay | Admitting: Neurology

## 2017-11-01 DIAGNOSIS — R569 Unspecified convulsions: Secondary | ICD-10-CM

## 2017-11-04 ENCOUNTER — Ambulatory Visit (HOSPITAL_COMMUNITY)
Admission: RE | Admit: 2017-11-04 | Discharge: 2017-11-04 | Disposition: A | Discharge status: Home / Self Care | Payer: Medicaid Other | Source: Ambulatory Visit | Attending: Neurology | Admitting: Neurology

## 2017-11-04 DIAGNOSIS — R569 Unspecified convulsions: Secondary | ICD-10-CM | POA: Insufficient documentation

## 2017-11-04 NOTE — Procedures (Signed)
EEG Report  Clinical History:  Incident of left sided focal motor activity without control.  Technical Summary:  A 19 channel digital EEG recording was performed using the 10-20 international system of electrode placement.  Bipolar and Referential montages were used.  The total recording time was approx 20 minutes.  Findings:  There is a posterior dominant rhythm of 9 Hz reactive to eye opening and closure.  No focal slowing is present.  Photic stimulation did not produce a driving response and did not elicit any abnormalities.  She gets intermittently drowsy and does enter stage N2 sleep with vertex sharp waves, K complexes, and frontocentral sleep spindles.  No abnormalities during sleep.  There are no epileptiform discharges or electrographic seizures present.  Impression:  This is a normal EEG in the awake and sleep states.  There is no evidence of a seizure tendency, however, this does not rule it out.  If clinical suspicion remains high then a more prolonged EEG may be of additional value.    Rogue Jury, MS, MD

## 2017-11-04 NOTE — Progress Notes (Signed)
EEG completed, results pending. 

## 2017-11-22 ENCOUNTER — Ambulatory Visit: Payer: Medicaid Other | Admitting: Podiatry

## 2017-11-22 ENCOUNTER — Other Ambulatory Visit: Payer: Self-pay | Admitting: Neurosurgery

## 2017-11-22 ENCOUNTER — Encounter (HOSPITAL_COMMUNITY): Payer: Self-pay

## 2017-11-22 NOTE — Progress Notes (Signed)
Pt. Denies chest or flu concerns.  Pt. Reports that she was seen by Dr. Einar Gip " a couple of years ago", related to some sort of chest pain.  She had a stress test & was told that it was wnl, that she should only return for cardiac f/u PRN.  Pt. Given instructions for SDW; emphasizing the diabetes protocol-  Tues.- no changes in Janumet or Novolog but to only use 14 units of Lantus on Tues.  night .  Day of surgery- WED.- no insulin or Oral meds for diabetes. Pt. Is to arrive at 0630- not sure she will check her BS that a.m.;  remarks that sometimes she " just can't do it because she has to get ready for appts". Marland Kitchen

## 2017-11-23 NOTE — Anesthesia Preprocedure Evaluation (Addendum)
Anesthesia Evaluation  Patient identified by MRN, date of birth, ID band Patient awake    Reviewed: Allergy & Precautions, NPO status , Patient's Chart, lab work & pertinent test results, reviewed documented beta blocker date and time   Airway Mallampati: II  TM Distance: >3 FB Neck ROM: Full    Dental  (+) Edentulous Upper, Missing,    Pulmonary  Inhaler for wheezing   Pulmonary exam normal breath sounds clear to auscultation       Cardiovascular hypertension, Pt. on medications and Pt. on home beta blockers Normal cardiovascular exam Rhythm:Regular Rate:Normal  ECG: NSR, rate 80   Neuro/Psych PSYCHIATRIC DISORDERS Anxiety Depression negative neurological ROS     GI/Hepatic negative GI ROS, Neg liver ROS,   Endo/Other  diabetes, Insulin Dependent, Oral Hypoglycemic Agents  Renal/GU negative Renal ROS     Musculoskeletal  (+) Fibromyalgia -  Abdominal   Peds  Hematology  (+) anemia , HLD   Anesthesia Other Findings carpal tunnel syndrome of right wrist  Reproductive/Obstetrics                            Anesthesia Physical  Anesthesia Plan  ASA: III  Anesthesia Plan: General   Post-op Pain Management:    Induction: Intravenous  PONV Risk Score and Plan: 3 and Midazolam, Dexamethasone, Ondansetron and Treatment may vary due to age or medical condition  Airway Management Planned: LMA  Additional Equipment:   Intra-op Plan:   Post-operative Plan: Extubation in OR  Informed Consent: I have reviewed the patients History and Physical, chart, labs and discussed the procedure including the risks, benefits and alternatives for the proposed anesthesia with the patient or authorized representative who has indicated his/her understanding and acceptance.     Plan Discussed with: CRNA  Anesthesia Plan Comments:        Anesthesia Quick Evaluation

## 2017-11-24 ENCOUNTER — Ambulatory Visit (HOSPITAL_COMMUNITY)
Admission: RE | Admit: 2017-11-24 | Discharge: 2017-11-24 | Disposition: A | Discharge status: Home / Self Care | Payer: Medicaid Other | Source: Ambulatory Visit | Attending: Neurosurgery | Admitting: Neurosurgery

## 2017-11-24 ENCOUNTER — Encounter (HOSPITAL_COMMUNITY): Payer: Self-pay | Admitting: *Deleted

## 2017-11-24 ENCOUNTER — Encounter (HOSPITAL_COMMUNITY)
Admission: RE | Disposition: A | Discharge status: Home / Self Care | Payer: Self-pay | Source: Ambulatory Visit | Attending: Neurosurgery

## 2017-11-24 ENCOUNTER — Ambulatory Visit (HOSPITAL_COMMUNITY): Payer: Medicaid Other | Admitting: Anesthesiology

## 2017-11-24 ENCOUNTER — Other Ambulatory Visit: Payer: Self-pay

## 2017-11-24 DIAGNOSIS — M5136 Other intervertebral disc degeneration, lumbar region: Secondary | ICD-10-CM | POA: Diagnosis not present

## 2017-11-24 DIAGNOSIS — Z79899 Other long term (current) drug therapy: Secondary | ICD-10-CM | POA: Insufficient documentation

## 2017-11-24 DIAGNOSIS — E1142 Type 2 diabetes mellitus with diabetic polyneuropathy: Secondary | ICD-10-CM | POA: Diagnosis not present

## 2017-11-24 DIAGNOSIS — Z794 Long term (current) use of insulin: Secondary | ICD-10-CM | POA: Diagnosis not present

## 2017-11-24 DIAGNOSIS — Z833 Family history of diabetes mellitus: Secondary | ICD-10-CM | POA: Diagnosis not present

## 2017-11-24 DIAGNOSIS — M797 Fibromyalgia: Secondary | ICD-10-CM | POA: Diagnosis not present

## 2017-11-24 DIAGNOSIS — G5601 Carpal tunnel syndrome, right upper limb: Secondary | ICD-10-CM | POA: Insufficient documentation

## 2017-11-24 DIAGNOSIS — G5621 Lesion of ulnar nerve, right upper limb: Secondary | ICD-10-CM | POA: Diagnosis not present

## 2017-11-24 DIAGNOSIS — F418 Other specified anxiety disorders: Secondary | ICD-10-CM | POA: Insufficient documentation

## 2017-11-24 DIAGNOSIS — Z880 Allergy status to penicillin: Secondary | ICD-10-CM | POA: Diagnosis not present

## 2017-11-24 DIAGNOSIS — I10 Essential (primary) hypertension: Secondary | ICD-10-CM | POA: Diagnosis not present

## 2017-11-24 DIAGNOSIS — Z8249 Family history of ischemic heart disease and other diseases of the circulatory system: Secondary | ICD-10-CM | POA: Insufficient documentation

## 2017-11-24 DIAGNOSIS — R42 Dizziness and giddiness: Secondary | ICD-10-CM | POA: Insufficient documentation

## 2017-11-24 DIAGNOSIS — Z91048 Other nonmedicinal substance allergy status: Secondary | ICD-10-CM | POA: Diagnosis not present

## 2017-11-24 DIAGNOSIS — D649 Anemia, unspecified: Secondary | ICD-10-CM | POA: Insufficient documentation

## 2017-11-24 DIAGNOSIS — Z820 Family history of epilepsy and other diseases of the nervous system: Secondary | ICD-10-CM | POA: Diagnosis not present

## 2017-11-24 DIAGNOSIS — E785 Hyperlipidemia, unspecified: Secondary | ICD-10-CM | POA: Insufficient documentation

## 2017-11-24 DIAGNOSIS — Z841 Family history of disorders of kidney and ureter: Secondary | ICD-10-CM | POA: Diagnosis not present

## 2017-11-24 HISTORY — PX: CARPAL TUNNEL RELEASE: SHX101

## 2017-11-24 HISTORY — PX: ULNAR NERVE TRANSPOSITION: SHX2595

## 2017-11-24 HISTORY — DX: Proteinuria, unspecified: R80.9

## 2017-11-24 LAB — GLUCOSE, CAPILLARY: Glucose-Capillary: 107 mg/dL — ABNORMAL HIGH (ref 65–99)

## 2017-11-24 LAB — CBC
HEMATOCRIT: 34.6 % — AB (ref 36.0–46.0)
Hemoglobin: 11.2 g/dL — ABNORMAL LOW (ref 12.0–15.0)
MCH: 28.2 pg (ref 26.0–34.0)
MCHC: 32.4 g/dL (ref 30.0–36.0)
MCV: 87.2 fL (ref 78.0–100.0)
PLATELETS: 181 10*3/uL (ref 150–400)
RBC: 3.97 MIL/uL (ref 3.87–5.11)
RDW: 14 % (ref 11.5–15.5)
WBC: 9.5 10*3/uL (ref 4.0–10.5)

## 2017-11-24 LAB — BASIC METABOLIC PANEL
ANION GAP: 14 (ref 5–15)
BUN: 12 mg/dL (ref 6–20)
CALCIUM: 8.8 mg/dL — AB (ref 8.9–10.3)
CO2: 24 mmol/L (ref 22–32)
CREATININE: 1.01 mg/dL — AB (ref 0.44–1.00)
Chloride: 100 mmol/L — ABNORMAL LOW (ref 101–111)
GFR calc Af Amer: >60 mL/min (ref >60)
GLUCOSE: 173 mg/dL — AB (ref 65–99)
Potassium: 3.2 mmol/L — ABNORMAL LOW (ref 3.5–5.1)
Sodium: 138 mmol/L (ref 135–145)

## 2017-11-24 LAB — HEMOGLOBIN A1C
Hgb A1c MFr Bld: 8.8 % — ABNORMAL HIGH (ref 4.8–5.6)
Mean Plasma Glucose: 205.86 mg/dL

## 2017-11-24 LAB — POCT PREGNANCY, URINE: Preg Test, Ur: NEGATIVE

## 2017-11-24 SURGERY — CARPAL TUNNEL RELEASE
Anesthesia: General | Site: Wrist | Laterality: Right

## 2017-11-24 MED ORDER — LIDOCAINE HCL (CARDIAC) 20 MG/ML IV SOLN
INTRAVENOUS | Status: DC | PRN
  Administered 2017-11-24: 60 mg via INTRAVENOUS

## 2017-11-24 MED ORDER — 0.9 % SODIUM CHLORIDE (POUR BTL) OPTIME
TOPICAL | Status: DC | PRN
  Administered 2017-11-24: 1000 mL

## 2017-11-24 MED ORDER — MIDAZOLAM HCL 2 MG/2ML IJ SOLN
INTRAMUSCULAR | Status: AC
  Filled 2017-11-24: qty 2

## 2017-11-24 MED ORDER — EPHEDRINE 5 MG/ML INJ
INTRAVENOUS | Status: AC
  Filled 2017-11-24: qty 10

## 2017-11-24 MED ORDER — LIDOCAINE-EPINEPHRINE 0.5 %-1:200000 IJ SOLN
INTRAMUSCULAR | Status: AC
  Filled 2017-11-24: qty 2

## 2017-11-24 MED ORDER — SUGAMMADEX SODIUM 200 MG/2ML IV SOLN
INTRAVENOUS | Status: AC
  Filled 2017-11-24: qty 2

## 2017-11-24 MED ORDER — MIDAZOLAM HCL 5 MG/5ML IJ SOLN
INTRAMUSCULAR | Status: DC | PRN
  Administered 2017-11-24: 2 mg via INTRAVENOUS

## 2017-11-24 MED ORDER — PROPOFOL 10 MG/ML IV BOLUS
INTRAVENOUS | Status: AC
  Filled 2017-11-24: qty 20

## 2017-11-24 MED ORDER — LACTATED RINGERS IV SOLN
INTRAVENOUS | Status: DC | PRN
  Administered 2017-11-24: 08:00:00 via INTRAVENOUS

## 2017-11-24 MED ORDER — PROPOFOL 10 MG/ML IV BOLUS
INTRAVENOUS | Status: DC | PRN
  Administered 2017-11-24: 140 mg via INTRAVENOUS

## 2017-11-24 MED ORDER — DEXAMETHASONE SODIUM PHOSPHATE 10 MG/ML IJ SOLN
INTRAMUSCULAR | Status: DC | PRN
  Administered 2017-11-24: 10 mg via INTRAVENOUS

## 2017-11-24 MED ORDER — PHENYLEPHRINE HCL 10 MG/ML IJ SOLN
INTRAMUSCULAR | Status: DC | PRN
  Administered 2017-11-24 (×3): 80 ug via INTRAVENOUS

## 2017-11-24 MED ORDER — ACETAMINOPHEN-CODEINE #3 300-30 MG PO TABS: 1.0000 | ORAL_TABLET | Freq: Four times a day (QID) | ORAL | 0 refills | Status: DC | PRN

## 2017-11-24 MED ORDER — FENTANYL CITRATE (PF) 100 MCG/2ML IJ SOLN
INTRAMUSCULAR | Status: DC | PRN
  Administered 2017-11-24 (×3): 50 ug via INTRAVENOUS

## 2017-11-24 MED ORDER — CHLORHEXIDINE GLUCONATE CLOTH 2 % EX PADS: 6.0000 | MEDICATED_PAD | Freq: Once | CUTANEOUS | Status: DC

## 2017-11-24 MED ORDER — BACITRACIN ZINC 500 UNIT/GM EX OINT
TOPICAL_OINTMENT | CUTANEOUS | Status: DC | PRN
  Administered 2017-11-24: 1 via TOPICAL

## 2017-11-24 MED ORDER — VANCOMYCIN HCL 10 G IV SOLR
1500.0000 mg | INTRAVENOUS | Status: AC
  Administered 2017-11-24: 1500 mg via INTRAVENOUS
  Filled 2017-11-24: qty 1500

## 2017-11-24 MED ORDER — DEXAMETHASONE SODIUM PHOSPHATE 10 MG/ML IJ SOLN
INTRAMUSCULAR | Status: AC
  Filled 2017-11-24: qty 1

## 2017-11-24 MED ORDER — SUCCINYLCHOLINE CHLORIDE 200 MG/10ML IV SOSY
PREFILLED_SYRINGE | INTRAVENOUS | Status: AC
  Filled 2017-11-24: qty 10

## 2017-11-24 MED ORDER — ONDANSETRON HCL 4 MG/2ML IJ SOLN
INTRAMUSCULAR | Status: AC
  Filled 2017-11-24: qty 2

## 2017-11-24 MED ORDER — ROCURONIUM BROMIDE 10 MG/ML (PF) SYRINGE
PREFILLED_SYRINGE | INTRAVENOUS | Status: AC
  Filled 2017-11-24: qty 5

## 2017-11-24 MED ORDER — ONDANSETRON HCL 4 MG/2ML IJ SOLN
INTRAMUSCULAR | Status: DC | PRN
  Administered 2017-11-24: 4 mg via INTRAVENOUS

## 2017-11-24 MED ORDER — FENTANYL CITRATE (PF) 250 MCG/5ML IJ SOLN
INTRAMUSCULAR | Status: AC
  Filled 2017-11-24: qty 5

## 2017-11-24 MED ORDER — VANCOMYCIN HCL IN DEXTROSE 1-5 GM/200ML-% IV SOLN
INTRAVENOUS | Status: AC
  Filled 2017-11-24: qty 200

## 2017-11-24 MED ORDER — LIDOCAINE-EPINEPHRINE 0.5 %-1:200000 IJ SOLN
INTRAMUSCULAR | Status: DC | PRN
  Administered 2017-11-24: 10 mL

## 2017-11-24 MED ORDER — BACITRACIN ZINC 500 UNIT/GM EX OINT
TOPICAL_OINTMENT | CUTANEOUS | Status: AC
  Filled 2017-11-24: qty 28.35

## 2017-11-24 MED ORDER — LIDOCAINE 2% (20 MG/ML) 5 ML SYRINGE
INTRAMUSCULAR | Status: AC
  Filled 2017-11-24: qty 5

## 2017-11-24 MED ORDER — PHENYLEPHRINE 40 MCG/ML (10ML) SYRINGE FOR IV PUSH (FOR BLOOD PRESSURE SUPPORT)
PREFILLED_SYRINGE | INTRAVENOUS | Status: AC
  Filled 2017-11-24: qty 10

## 2017-11-24 SURGICAL SUPPLY — 65 items
BANDAGE ACE 3X5.8 VEL STRL LF (GAUZE/BANDAGES/DRESSINGS) ×3 IMPLANT
BLADE SURG 10 STRL SS (BLADE) ×3 IMPLANT
BLADE SURG 15 STRL LF DISP TIS (BLADE) ×2 IMPLANT
BLADE SURG 15 STRL SS (BLADE) ×1
BNDG GAUZE ELAST 4 BULKY (GAUZE/BANDAGES/DRESSINGS) ×3 IMPLANT
CABLE BIPOLOR RESECTION CORD (MISCELLANEOUS) ×3 IMPLANT
CARTRIDGE OIL MAESTRO DRILL (MISCELLANEOUS) ×2 IMPLANT
DECANTER SPIKE VIAL GLASS SM (MISCELLANEOUS) ×3 IMPLANT
DERMABOND ADVANCED (GAUZE/BANDAGES/DRESSINGS) ×1
DERMABOND ADVANCED .7 DNX12 (GAUZE/BANDAGES/DRESSINGS) ×2 IMPLANT
DIFFUSER DRILL AIR PNEUMATIC (MISCELLANEOUS) ×3 IMPLANT
DRAPE EXTREMITY T 121X128X90 (DRAPE) ×3 IMPLANT
DRAPE HALF SHEET 40X57 (DRAPES) ×3 IMPLANT
DURAPREP 26ML APPLICATOR (WOUND CARE) ×3 IMPLANT
ELECT CAUTERY BLADE 6.4 (BLADE) ×3 IMPLANT
GAUZE SPONGE 4X4 12PLY STRL (GAUZE/BANDAGES/DRESSINGS) ×3 IMPLANT
GAUZE SPONGE 4X4 16PLY XRAY LF (GAUZE/BANDAGES/DRESSINGS) ×3 IMPLANT
GLOVE BIO SURGEON STRL SZ 6.5 (GLOVE) IMPLANT
GLOVE BIO SURGEON STRL SZ7 (GLOVE) IMPLANT
GLOVE BIO SURGEON STRL SZ7.5 (GLOVE) IMPLANT
GLOVE BIO SURGEON STRL SZ8 (GLOVE) IMPLANT
GLOVE BIO SURGEON STRL SZ8.5 (GLOVE) IMPLANT
GLOVE BIOGEL M 8.0 STRL (GLOVE) IMPLANT
GLOVE ECLIPSE 6.5 STRL STRAW (GLOVE) ×3 IMPLANT
GLOVE ECLIPSE 7.0 STRL STRAW (GLOVE) IMPLANT
GLOVE ECLIPSE 7.5 STRL STRAW (GLOVE) IMPLANT
GLOVE ECLIPSE 8.0 STRL XLNG CF (GLOVE) IMPLANT
GLOVE ECLIPSE 8.5 STRL (GLOVE) IMPLANT
GLOVE EXAM NITRILE LRG STRL (GLOVE) IMPLANT
GLOVE EXAM NITRILE XL STR (GLOVE) IMPLANT
GLOVE EXAM NITRILE XS STR PU (GLOVE) IMPLANT
GLOVE INDICATOR 6.5 STRL GRN (GLOVE) IMPLANT
GLOVE INDICATOR 7.0 STRL GRN (GLOVE) IMPLANT
GLOVE INDICATOR 7.5 STRL GRN (GLOVE) IMPLANT
GLOVE INDICATOR 8.0 STRL GRN (GLOVE) IMPLANT
GLOVE INDICATOR 8.5 STRL (GLOVE) IMPLANT
GLOVE OPTIFIT SS 8.0 STRL (GLOVE) IMPLANT
GLOVE SURG SS PI 6.5 STRL IVOR (GLOVE) IMPLANT
GOWN STRL REUS W/ TWL LRG LVL3 (GOWN DISPOSABLE) ×4 IMPLANT
GOWN STRL REUS W/ TWL XL LVL3 (GOWN DISPOSABLE) ×2 IMPLANT
GOWN STRL REUS W/TWL 2XL LVL3 (GOWN DISPOSABLE) IMPLANT
GOWN STRL REUS W/TWL LRG LVL3 (GOWN DISPOSABLE) ×2
GOWN STRL REUS W/TWL XL LVL3 (GOWN DISPOSABLE) ×1
KIT BASIN OR (CUSTOM PROCEDURE TRAY) ×3 IMPLANT
KIT ROOM TURNOVER OR (KITS) ×3 IMPLANT
LOCATOR NERVE 3 VOLT (DISPOSABLE) ×3 IMPLANT
LOOP VESSEL MAXI BLUE (MISCELLANEOUS) IMPLANT
NEEDLE HYPO 25X1 1.5 SAFETY (NEEDLE) ×3 IMPLANT
NS IRRIG 1000ML POUR BTL (IV SOLUTION) ×3 IMPLANT
OIL CARTRIDGE MAESTRO DRILL (MISCELLANEOUS) ×3
PACK SURGICAL SETUP 50X90 (CUSTOM PROCEDURE TRAY) ×3 IMPLANT
PAD ARMBOARD 7.5X6 YLW CONV (MISCELLANEOUS) ×9 IMPLANT
PENCIL BUTTON HOLSTER BLD 10FT (ELECTRODE) ×3 IMPLANT
STOCKINETTE 4X48 STRL (DRAPES) ×3 IMPLANT
SUT ETHILON 3 0 PS 1 (SUTURE) ×3 IMPLANT
SUT SILK 3 0 SH 30 (SUTURE) IMPLANT
SUT VIC AB 2-0 CT2 18 VCP726D (SUTURE) ×3 IMPLANT
SUT VIC AB 3-0 SH 8-18 (SUTURE) ×3 IMPLANT
SYR BULB 3OZ (MISCELLANEOUS) ×3 IMPLANT
SYR CONTROL 10ML LL (SYRINGE) ×3 IMPLANT
TOWEL GREEN STERILE (TOWEL DISPOSABLE) ×3 IMPLANT
TOWEL GREEN STERILE FF (TOWEL DISPOSABLE) ×3 IMPLANT
TUBE CONNECTING 12X1/4 (SUCTIONS) ×3 IMPLANT
UNDERPAD 30X30 (UNDERPADS AND DIAPERS) ×3 IMPLANT
WATER STERILE IRR 1000ML POUR (IV SOLUTION) ×3 IMPLANT

## 2017-11-24 NOTE — Op Note (Signed)
   10:36 AM  PATIENT:  Alicia Coffey  47 y.o. 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 She has opted for operative decompression of the median and ulnar nerves  PRE-OPERATIVE DIAGNOSIS:  right Carpal Tunnel Syndrome, right ulnar neuropathy  POST-OPERATIVE DIAGNOSIS:  right Carpal Tunnel Syndrome, right ulnar neuropathy  PROCEDURE:  Procedure(s): RIGHT CARPAL TUNNEL RELEASE RIGHT ULNAR NERVE DECOMPRESSION/TRANSPOSITION  SURGEON: Surgeon(s): Ashok Pall, MD  ANESTHESIA:   general  EBL:  Total I/O In: 600 [I.V.:600] Out: 50 [Blood:50]  COUNT:per nursing  DICTATION: [name] was taken to the operating room, given IV sedation, and positioned on the operating room table. She had her  right upper extremity prepped and draped in a sterile manner. I infiltrated Licodcaine  2/9% 4/765,465 strength epinephrine into the planned incision starting at the proximal palmar crease extending into the hand ~ 1.5cm, and a semicircular incision at the medial elbow.I opened the incision around the medial and lateral epicondyles to expose the cubital tunnel. I dissected sharply with Metzenbaum scissors to the cubital tunnel. I exposed the ulnar nerve, and confirmed with a nerve stimulator. I decompressed the nerve proximally and distally with the metzenbaum scissors dividing some of the flexor carpi ulnaris. Once satisfied with the decompression I irrigated then closed the incision with vicryl sutures in the subcutaneous, and subcuticular planes. I used dermabond for a sterile dressing.  I opened the skin in the palm with a 15 blade and extended the incision through the skin into the subcutaneous tissue. I used the bipolar cautery to control the subcutaneous bleeding. I dissected sharply through the tissue using forceps also to expose the transverse carpal ligament. I dived the transverse carpal ligament sharply with the 15 blade using the forceps to protect the contents  of the carpal tunnel. With the scissors I divided the ligament both proximally and distally to decompress the entire carpal tunnel. I used the scissors to dissect into the forearm to create space to divide the ligament to the proximal palmar crease, and distally into the palm.  I irrigated the wound then closed the incision with vertical interrupted vertical mattress sutures. I placed a sterile dressing, then wrapped the proximal hand and distal forearm with an ace wrap.  PLAN OF CARE: Discharge to home after PACU  PATIENT DISPOSITION:  PACU - hemodynamically stable.   Delay start of Pharmacological VTE agent (>24hrs) due to surgical blood loss or risk of bleeding:  yes

## 2017-11-24 NOTE — Anesthesia Procedure Notes (Signed)
Procedure Name: LMA Insertion Date/Time: 11/24/2017 10:27 AM Performed by: Shirlyn Goltz, CRNA Pre-anesthesia Checklist: Patient identified, Emergency Drugs available, Suction available and Patient being monitored Patient Re-evaluated:Patient Re-evaluated prior to induction Oxygen Delivery Method: Circle system utilized Preoxygenation: Pre-oxygenation with 100% oxygen Induction Type: IV induction Ventilation: Mask ventilation without difficulty LMA: LMA inserted LMA Size: 4.0 Number of attempts: 1 Placement Confirmation: positive ETCO2 and breath sounds checked- equal and bilateral Tube secured with: Tape Dental Injury: Teeth and Oropharynx as per pre-operative assessment

## 2017-11-24 NOTE — H&P (Signed)
BP (!) 145/84   Pulse 80   Temp 97.8 F (36.6 C) (Oral)   Resp 18   Ht 5' 3.5" (1.613 m)   Wt 114.3 kg (252 lb)   LMP 10/21/2017   SpO2 100%   BMI 43.94 kg/m  Alicia Coffey is a 47 year old woman who presents today for evaluation of pain that she has in the right carpal tunnel and right cubital tunnel. She is diabetic and does have peripheral neuropathy, but her hands have been getting worse. She also has carpal tunnel documented by EMG on the left side too. This has been an ongoing problem for some time. She does awake at night with shaking of her hands. The brace is just a little bit too uncomfortable. She had improved since being diagnosed in December, but she says it is getting worse again. She is 5 feet 3-1/2 inches in height, weighs 253.2 pounds, temperature is 97.5 Fahrenheit, blood pressure is 128/86, pulse is 103. PAST MEDICAL HISTORY: Significant for diabetes, hypertension, anemia, fibromyalgia, lumbar disc degeneration, vertigo, and peripheral neuropathy. She has undergone a lumpectomy on the right, a uterine ablation, and a left and right knee arthroscopy. ALLERGIES: PENICILLIN and POWDERED GLOVES. Penicillin causes hives. Powdered gloves cause itching. MEDICATIONS: 1. Duloxetine. 2. Methocarbamol. 3. Janumet. 4. Tramadol. 5. Hydroxyzine. 6. Rosuvastatin. 7. Metoprolol. 8. Benazepril. 9. Elavil. 10. Bumetanide. FAMILY HISTORY: Mother is 65 and in okay health by her words. She has diabetes. Father is deceased. Had diabetes, detached retina, renal failure, and cerebrovascular disease. SOCIAL HISTORY: In her words, she has carpal tunnel in both hands, right hand a lot of pain, left not much. Started with a lot of writing and was using it and that is probably what started the right hand pain. It has been going on 6-12 months. Says the pain is simply progressively gotten worse. Weakness in the hands, leg, numbness and tingling in the hands and legs. REVIEW OF SYSTEMS: Positive  for fever, hearing loss, balance problems, sore throat, shortness of breath, hypercholesterolemia, swelling in the feet, leg pain, nausea, vomiting, arthritis, arm weakness, leg weakness, back pain, arm pain, leg pain, anxiety, problems with coordination, arms or legs, and anemia. She has lost 6 pounds recently. PHYSICAL EXAMINATION: Positive Tinel sign over the cubital tunnel and transverse carpal ligament on the right, present but not as pronounced at the left transverse carpal ligament, not elucidated at the cubital tunnel on the left. Strength is full 5 of 5 in the biceps, triceps, brachioradialis, deltoids, grip, intrinsics of each hand. She has normal muscle tone, bulk, and coordination. Speech is clear, fluent. Pupils equal, round, and reactive to light. Full extraocular movements. Full visual fields. Hearing intact to voice. Uvula elevates midline. Shoulder shrug is normal. Tongue protrudes in midline. DIAGNOSIS: Bilateral carpal tunnel with right cubital tunnel compression.  I will go to the operating room for decompression of the transverse carpal ligament contents and the cubital tunnel contents this coming Wednesday. I expect her to go home the same day. Risks and benefits of bleeding, infection, damage to the hands, inability to use the hands. She understands and wishes to proceed.

## 2017-11-24 NOTE — Transfer of Care (Signed)
Immediate Anesthesia Transfer of Care Note  Patient: Alicia Coffey  Procedure(s) Performed: RIGHT CARPAL TUNNEL RELEASE (Right Wrist) RIGHT ULNAR NERVE DECOMPRESSION/TRANSPOSITION (Right Elbow)  Patient Location: PACU  Anesthesia Type:General  Level of Consciousness: drowsy  Airway & Oxygen Therapy: Patient Spontanous Breathing and Patient connected to face mask oxygen  Post-op Assessment: Report given to RN and Post -op Vital signs reviewed and stable  Post vital signs: Reviewed and stable  Last Vitals:  Vitals:   11/24/17 0720 11/24/17 1032  BP: (!) 145/84   Pulse: 80   Resp: 18   Temp: 36.6 C (!) 36.1 C  SpO2: 100%     Last Pain:  Vitals:   11/24/17 0726  TempSrc:   PainSc: 2       Patients Stated Pain Goal: 3 (37/79/39 [id number])  Complications: No apparent anesthesia complications

## 2017-11-24 NOTE — Anesthesia Postprocedure Evaluation (Signed)
Anesthesia Post Note  Patient: Alicia Coffey  Procedure(s) Performed: RIGHT CARPAL TUNNEL RELEASE (Right Wrist) RIGHT ULNAR NERVE DECOMPRESSION/TRANSPOSITION (Right Elbow)     Patient location during evaluation: PACU Anesthesia Type: General Level of consciousness: awake and alert Pain management: pain level controlled Vital Signs Assessment: post-procedure vital signs reviewed and stable Respiratory status: spontaneous breathing, nonlabored ventilation, respiratory function stable and patient connected to nasal cannula oxygen Cardiovascular status: blood pressure returned to baseline and stable Postop Assessment: no apparent nausea or vomiting Anesthetic complications: no    Last Vitals:  Vitals:   11/24/17 1202 11/24/17 1215  BP: 116/76 (!) 145/89  Pulse: 79 85  Resp: 18 16  Temp:    SpO2: 95% 95%    Last Pain:  Vitals:   11/24/17 1100  TempSrc:   PainSc: Asleep                 [name] P Sherese Heyward

## 2017-11-24 NOTE — Progress Notes (Addendum)
Patient has two whelps on right shoulder.  Will make dr Christella Noa aware  7:42 AM Dr. Christella Noa Paged  7:54 AM  Spoke with Dr. Christella Noa about whelps

## 2017-11-25 ENCOUNTER — Encounter (HOSPITAL_COMMUNITY): Payer: Self-pay | Admitting: Neurosurgery

## 2017-12-02 ENCOUNTER — Encounter (HOSPITAL_COMMUNITY): Payer: Self-pay | Admitting: Emergency Medicine

## 2017-12-02 ENCOUNTER — Other Ambulatory Visit: Payer: Self-pay

## 2017-12-02 ENCOUNTER — Ambulatory Visit (HOSPITAL_COMMUNITY)
Admission: EM | Admit: 2017-12-02 | Discharge: 2017-12-02 | Disposition: A | Discharge status: Home / Self Care | Payer: Medicaid Other | Attending: Family Medicine | Admitting: Family Medicine

## 2017-12-02 DIAGNOSIS — R69 Illness, unspecified: Secondary | ICD-10-CM

## 2017-12-02 DIAGNOSIS — J111 Influenza due to unidentified influenza virus with other respiratory manifestations: Secondary | ICD-10-CM

## 2017-12-02 NOTE — ED Triage Notes (Signed)
Onset 3 days ago of symptoms, felt like she had issues clearing throat.  Reports fever at its highest point was 102.4.

## 2017-12-02 NOTE — Discharge Instructions (Signed)

## 2017-12-06 ENCOUNTER — Ambulatory Visit: Payer: Medicaid Other | Admitting: Podiatry

## 2017-12-06 DIAGNOSIS — M79676 Pain in unspecified toe(s): Secondary | ICD-10-CM

## 2017-12-06 DIAGNOSIS — B351 Tinea unguium: Secondary | ICD-10-CM

## 2017-12-06 DIAGNOSIS — M79609 Pain in unspecified limb: Principal | ICD-10-CM

## 2017-12-06 DIAGNOSIS — E0843 Diabetes mellitus due to underlying condition with diabetic autonomic (poly)neuropathy: Secondary | ICD-10-CM

## 2017-12-06 DIAGNOSIS — L84 Corns and callosities: Secondary | ICD-10-CM

## 2017-12-06 NOTE — ED Provider Notes (Signed)
[place]   [id number] 12/02/17 Arrival Time: 9379  ASSESSMENT & PLAN:  1. Influenza-like illness    Discussed typical duration of symptoms. OTC symptom care as needed. Ensure adequate fluid intake and rest. May f/u with PCP or here as needed.  Reviewed expectations re: course of current medical issues. Questions answered. Outlined signs and symptoms indicating need for more acute intervention. Patient verbalized understanding. After Visit Summary given.   SUBJECTIVE: History from: patient.  Candid Kemler is a 47 y.o. female who presents with complaint of nasal congestion, post-nasal drainage, and a persistent dry cough. Onset abrupt, approximately 3 days ago. Overall fatigued with body aches. SOB: none. Wheezing: none. Fever: yes. Overall normal PO intake without n/v. Sick contacts: no. OTC treatment: Tylenol with help. Received flu shot this year: no.  Social History   Tobacco Use  Smoking Status Never Smoker  Smokeless Tobacco Never Used    ROS: As per HPI.   OBJECTIVE:  Vitals:   12/02/17 1740 12/02/17 1741  BP: 128/84   Pulse: (!) 101   Resp: 16   Temp: 99.5 F (37.5 C)   TempSrc: Oral   SpO2: 93%   Weight:  257 lb (116.6 kg)  Height:  5' 3.5" (1.613 m)     General appearance: alert; appears fatigued; febrile HEENT: nasal congestion; clear runny nose; throat irritation secondary to post-nasal drainage Neck: supple without LAD Lungs: unlabored respirations, symmetrical air entry; cough: moderate; no respiratory distress Skin: warm and dry Psychological: alert and cooperative; normal mood and affect  Imaging: No results found.  Allergies  Allergen Reactions  . Other Itching    Powered gloves  . Penicillins     Has patient had a PCN reaction causing immediate rash, facial/tongue/throat swelling, SOB or lightheadedness with hypotension: Yes Has patient had a PCN reaction causing severe rash involving mucus membranes or skin necrosis:  No Has patient had a PCN reaction that required hospitalization No Has patient had a PCN reaction occurring within the last 10 years: No If all of the above answers are "NO", then may proceed with Cephalosporin use.   . Seasonal Ic [Cholestatin]     Sneezing, runny nose    Past Medical History:  Diagnosis Date  . Anemia   . Anxiety   . Arthritis    right knee  . Cancer ([place])    calcifications in R breast - biopsy, took tamoxifen - completed 2018  . Depression   . Diabetes mellitus    IDDM  Type 2  . Disc degeneration, lumbar   . Fibromyalgia   . Hyperlipidemia   . Hypertension    under control, has been on med. > 1 yr.  H[place] Dyer body movements    while awake and asleep  . 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 Proteinuria    [facility] [place] following pt.   . Shortness of breath dyspnea    with exertion  . Vertigo 2015  . Wears dentures    upper  . Wears partial dentures    lower   Family History  Problem Relation Age of Onset  . Breast cancer Paternal Grandmother 109       breast; dbl mastectomy  . Diabetes Father   . Heart disease Father   . Hypertension Father   . Heart attack Father   . Diabetes Mother   . Heart disease Mother   . Hypertension Mother   .  Diabetes Sister   . Fibromyalgia Sister   . Diabetes Sister   . Heart attack Maternal Grandmother        <35  . Heart attack Maternal Grandfather   . Heart attack Paternal Grandfather   . Diabetes Brother    Social History   Socioeconomic History  . Marital status: Single    Spouse name: Not on file  . Number of children: Not on file  . Years of education: college  . Highest education level: Not on file  Social Needs  . Financial resource strain: Not on file  . Food insecurity - worry: Not on file  . Food insecurity - inability: Not on file  . Transportation needs - medical: Not on file  . Transportation needs -  non-medical: Not on file  Occupational History  . Occupation: care giver    Employer: [employer]  Tobacco Use  . Smoking status: Never Smoker  . Smokeless tobacco: Never Used  Substance and Sexual Activity  . Alcohol use: No  . Drug use: No  . Sexual activity: Not Currently    Birth control/protection: None, Abstinence  Other Topics Concern  . Not on file  Social History Narrative   Patient lives at home with her mother and she is single.   Patient works for [employer] .   Education college    Right handed   Caffeine none            Vanessa Kick, MD 12/06/17 1053

## 2017-12-07 NOTE — Progress Notes (Signed)
    Subjective: Patient is a 47 y.o. female presenting to the office today with a chief complaint of painful callus lesions to the bilateral feet that have been present for several weeks. She has not done anything to treat the symptoms. Walking and bearing weight increase the pain.   Patient also complains of elongated, thickened nails that cause pain while ambulating in shoes. She is unable to trim her own nails. Patient presents today for further treatment and evaluation.  Past Medical History:  Diagnosis Date  . Anemia   . Anxiety   . Arthritis    right knee  . Cancer ([place])    calcifications in R breast - biopsy, took tamoxifen - completed 2018  . Depression   . Diabetes mellitus    IDDM  Type 2  . Disc degeneration, lumbar   . Fibromyalgia   . Hyperlipidemia   . Hypertension    under control, has been on med. > 1 yr.  Hinton Dyer body movements    while awake and asleep  . 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 Proteinuria    [facility] in [place] following pt.   . Shortness of breath dyspnea    with exertion  . Vertigo 2015  . Wears dentures    upper  . Wears partial dentures    lower    Objective:  Physical Exam General: Alert and oriented x3 in no acute distress  Dermatology: Hyperkeratotic lesions present on the bilateral feet x 3. Pain on palpation with a central nucleated core noted. Skin is warm, dry and supple bilateral lower extremities. Negative for open lesions or macerations. Nails are tender, long, thickened and dystrophic with subungual debris, consistent with onychomycosis, 1-5 bilateral. No signs of infection noted.  Vascular: Palpable pedal pulses bilaterally. No edema or erythema noted. Capillary refill within normal limits.  Neurological: Epicritic and protective threshold grossly intact bilaterally.   Musculoskeletal Exam: Pain on palpation at the keratotic lesion noted. Range of  motion within normal limits bilateral. Muscle strength 5/5 in all groups bilateral.  Assessment: 1. Onychodystrophic nails 1-5 bilateral with hyperkeratosis of nails.  2. Onychomycosis of nail due to dermatophyte bilateral 3. Pre-ulcerative callus lesions to the bilateral feet x 3   Plan of Care:  #1 Patient evaluated. #2 Excisional debridement of keratoic lesion using a chisel blade was performed without incident.  #3 Dressed with light dressing. #4 Mechanical debridement of nails 1-5 bilaterally performed using a nail nipper. Filed with dremel without incident.  #5 Patient is to return to the clinic in 3 months.   Edrick Kins, DPM [facility]  Dr. Edrick Kins, [address]                                        [place], [place] [address]                Office [phone]  Fax [phone]

## 2017-12-16 ENCOUNTER — Other Ambulatory Visit: Payer: Self-pay | Admitting: Nurse Practitioner

## 2017-12-16 DIAGNOSIS — Z1231 Encounter for screening mammogram for malignant neoplasm of breast: Secondary | ICD-10-CM

## 2018-01-18 ENCOUNTER — Ambulatory Visit
Admission: RE | Admit: 2018-01-18 | Discharge: 2018-01-18 | Disposition: A | Discharge status: Home / Self Care | Payer: Medicaid Other | Source: Ambulatory Visit | Attending: Nurse Practitioner | Admitting: Nurse Practitioner

## 2018-01-18 DIAGNOSIS — Z1231 Encounter for screening mammogram for malignant neoplasm of breast: Secondary | ICD-10-CM

## 2018-02-28 ENCOUNTER — Encounter: Payer: Self-pay | Admitting: Podiatry

## 2018-02-28 ENCOUNTER — Ambulatory Visit: Payer: Medicaid Other | Admitting: Podiatry

## 2018-02-28 DIAGNOSIS — B351 Tinea unguium: Secondary | ICD-10-CM | POA: Diagnosis not present

## 2018-02-28 DIAGNOSIS — M79676 Pain in unspecified toe(s): Secondary | ICD-10-CM

## 2018-02-28 DIAGNOSIS — M79609 Pain in unspecified limb: Principal | ICD-10-CM

## 2018-02-28 DIAGNOSIS — E0843 Diabetes mellitus due to underlying condition with diabetic autonomic (poly)neuropathy: Secondary | ICD-10-CM

## 2018-02-28 DIAGNOSIS — L84 Corns and callosities: Secondary | ICD-10-CM

## 2018-03-02 NOTE — Progress Notes (Signed)
    Subjective: Patient is a 47 y.o. female presenting to the office today with a chief complaint of painful callus lesions to the bilateral feet that have been present for several months. She has not done anything to treat the symptoms. Walking and bearing weight increase the pain.   Patient also complains of elongated, thickened nails that cause pain while ambulating in shoes. She is unable to trim her own nails. Patient presents today for further treatment and evaluation.  Past Medical History:  Diagnosis Date  . Anemia   . Anxiety   . Arthritis    right knee  . Cancer ([place])    calcifications in R breast - biopsy, took tamoxifen - completed 2018  . Depression   . Diabetes mellitus    IDDM  Type 2  . Disc degeneration, lumbar   . Fibromyalgia   . Hyperlipidemia   . Hypertension    under control, has been on med. > 1 yr.  Hinton Dyer body movements    while awake and asleep  . 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 Proteinuria    [facility] in [place] following pt.   . Shortness of breath dyspnea    with exertion  . Vertigo 2015  . Wears dentures    upper  . Wears partial dentures    lower    Objective:  Physical Exam General: Alert and oriented x3 in no acute distress  Dermatology: Hyperkeratotic lesions present on the bilateral feet x 3. Pain on palpation with a central nucleated core noted. Skin is warm, dry and supple bilateral lower extremities. Negative for open lesions or macerations. Nails are tender, long, thickened and dystrophic with subungual debris, consistent with onychomycosis, 1-5 bilateral. No signs of infection noted.  Vascular: Palpable pedal pulses bilaterally. No edema or erythema noted. Capillary refill within normal limits.  Neurological: Epicritic and protective threshold grossly intact bilaterally.   Musculoskeletal Exam: Pain on palpation at the keratotic lesion noted. Range of  motion within normal limits bilateral. Muscle strength 5/5 in all groups bilateral.  Assessment: 1. Onychodystrophic nails 1-5 bilateral with hyperkeratosis of nails.  2. Onychomycosis of nail due to dermatophyte bilateral 3. Pre-ulcerative callus lesions to the bilateral feet x 3   Plan of Care:  #1 Patient evaluated. #2 Excisional debridement of keratoic lesion using a chisel blade was performed without incident.  #3 Dressed with light dressing. #4 Mechanical debridement of nails 1-5 bilaterally performed using a nail nipper. Filed with dremel without incident.  #5 Patient is to return to the clinic in 3 months.   Edrick Kins, DPM [facility]  Dr. Edrick Kins, [address]                                        [place], [place] [address]                Office [phone]  Fax [phone]

## 2018-03-12 ENCOUNTER — Emergency Department (HOSPITAL_COMMUNITY)
Admission: EM | Admit: 2018-03-12 | Discharge: 2018-03-13 | Disposition: A | Discharge status: Home / Self Care | Payer: Medicaid Other | Attending: Emergency Medicine | Admitting: Emergency Medicine

## 2018-03-12 ENCOUNTER — Encounter (HOSPITAL_COMMUNITY): Payer: Self-pay | Admitting: Emergency Medicine

## 2018-03-12 DIAGNOSIS — E1140 Type 2 diabetes mellitus with diabetic neuropathy, unspecified: Secondary | ICD-10-CM | POA: Insufficient documentation

## 2018-03-12 DIAGNOSIS — Z79899 Other long term (current) drug therapy: Secondary | ICD-10-CM | POA: Insufficient documentation

## 2018-03-12 DIAGNOSIS — Z794 Long term (current) use of insulin: Secondary | ICD-10-CM | POA: Diagnosis not present

## 2018-03-12 DIAGNOSIS — I10 Essential (primary) hypertension: Secondary | ICD-10-CM | POA: Insufficient documentation

## 2018-03-12 DIAGNOSIS — R251 Tremor, unspecified: Secondary | ICD-10-CM | POA: Diagnosis not present

## 2018-03-12 LAB — CBC
HEMATOCRIT: 35.1 % — AB (ref 36.0–46.0)
HEMOGLOBIN: 11.2 g/dL — AB (ref 12.0–15.0)
MCH: 28.3 pg (ref 26.0–34.0)
MCHC: 31.9 g/dL (ref 30.0–36.0)
MCV: 88.6 fL (ref 78.0–100.0)
PLATELETS: 210 10*3/uL (ref 150–400)
RBC: 3.96 MIL/uL (ref 3.87–5.11)
RDW: 13.7 % (ref 11.5–15.5)
WBC: 11.4 10*3/uL — AB (ref 4.0–10.5)

## 2018-03-12 LAB — BASIC METABOLIC PANEL
ANION GAP: 10 (ref 5–15)
BUN: 16 mg/dL (ref 6–20)
CO2: 25 mmol/L (ref 22–32)
Calcium: 9.2 mg/dL (ref 8.9–10.3)
Chloride: 103 mmol/L (ref 101–111)
Creatinine, Ser: 1.22 mg/dL — ABNORMAL HIGH (ref 0.44–1.00)
GFR calc Af Amer: 60 mL/min — ABNORMAL LOW (ref >60)
GFR, EST NON AFRICAN AMERICAN: 52 mL/min — AB (ref >60)
GLUCOSE: 245 mg/dL — AB (ref 65–99)
POTASSIUM: 3.8 mmol/L (ref 3.5–5.1)
Sodium: 138 mmol/L (ref 135–145)

## 2018-03-12 NOTE — ED Triage Notes (Signed)
Reports having episode of tremors for the last few months.  Seen by neurology and they are unsure.  Told her to come here if it happened again.  Reports the tremors are sometimes just one arm and sometimes the whole body.  Had a MRI of the brain.  Reports it started with just her hand shaking.  Had another episode tonight around pta.  Reports feeling weak with it and feels exhausted afterwards.

## 2018-03-13 LAB — VITAMIN B12: Vitamin B-12: 467 pg/mL (ref 180–914)

## 2018-03-13 LAB — FOLATE: FOLATE: 19.8 ng/mL (ref >5.9)

## 2018-03-13 LAB — TSH: TSH: 1.852 u[IU]/mL (ref 0.350–4.500)

## 2018-03-13 NOTE — ED Provider Notes (Signed)
[facility] EMERGENCY DEPARTMENT Provider Note   CSN: [id number] Arrival date & time: 03/12/18  2235     History   Chief Complaint Chief Complaint  Patient presents with  . Tremors    HPI Alicia Coffey is a 47 y.o. female.  The history is provided by the patient and medical records.     47 y.o. F with hx of anemia, anxiety, arthritis, DM, depression, fibromyalgia, HLP, HTN, peripheral neuropathy, presenting to the ED for tremors.  Patient states this has been ongoing for several months but starting to occur more frequently.  States she gets episodes of tremors-- predominantly on right side but does occur on left at times, mostly involving hands but sometimes legs too.  She has noticed this seems to occur more if she ha been up and moving around or awake too long.  States today she had a horrible episode after being at a ball game for several hours in the heat.  She denies any focal numbness/weakness, dizziness, blurred vision, confusion, etc.  Has chronic paresthesias in her feet due to neuropathy which is unchanged.  Patient reports being awake during all her episodes of tremors, remembers all event.  Mother denies seeing her "blank out" or "stare off into space" with them.  States she has nearly fallen a few times because of the tremors.  Patient does follow with neurology-- [facility] neurology.  She has had MRI in Jan 2019 with a few T2 hyperdensities in the white matter that were not felt to be clinically significant, no other acute findings.  Also had EEG in January which was normal.  States her doctor was not sure what was causing her symptoms, told her to come to the ED next time it happened to "check her levels".  Patient is right hand dominant.  Has had carpal tunnel release as well as ulnar nerve release on right arm, however tremors present prior to this.  Past Medical History:  Diagnosis Date  . Anemia   . Anxiety   . Arthritis    right knee  . Cancer  ([place])    calcifications in R breast - biopsy, took tamoxifen - completed 2018  . Depression   . Diabetes mellitus    IDDM  Type 2  . Disc degeneration, lumbar   . Fibromyalgia   . Hyperlipidemia   . Hypertension    under control, has been on med. > 1 yr.  [place] body movements    while awake and asleep  . 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 Proteinuria    [facility] in [place] following pt.   . Shortness of breath dyspnea    with exertion  . Vertigo 2015  . Wears dentures    upper  . Wears partial dentures    lower    Patient Active Problem List   Diagnosis Date Noted  . Menorrhagia with irregular cycle 07/25/2014  . Iron deficiency anemia due to chronic blood loss 07/25/2014  . Follow up 05/23/2014  . Fibroid, uterine 04/04/2014  . Abnormal uterine bleeding (AUB) 03/26/2014  . Diabetic peripheral neuropathy ([place]) 12/21/2013  . Low back pain 12/11/2013  . Pain in joint, shoulder region 12/11/2013  . Pain in limb 12/11/2013  . 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 Diabetes ([place]) 07/28/2012  . Hypertension 07/28/2012  . Hypercholesteremia 07/28/2012  . Atypical lobular hyperplasia of breast 07/13/2012  Past Surgical History:  Procedure Laterality Date  . ABLATION     uterine   . BREAST BIOPSY Left 2014   benign  . BREAST BIOPSY Right 2013   high risk  . BREAST LUMPECTOMY WITH NEEDLE LOCALIZATION  08/15/2012   Procedure: BREAST LUMPECTOMY WITH NEEDLE LOCALIZATION;  Surgeon: Haywood Lasso, MD;  Location: [facility];  Service: General;  Laterality: Right;  Wire localizations Right breast calcifications  . BREAST SURGERY    . CARPAL TUNNEL RELEASE Right 11/24/2017   Procedure: RIGHT CARPAL TUNNEL RELEASE;  Surgeon: Ashok Pall, MD;  Location: [facility];  Service: Neurosurgery;  Laterality: Right;  Right CARPAL TUNNEL RELEASE  . DILITATION &  CURRETTAGE/HYSTROSCOPY WITH THERMACHOICE ABLATION N/A 08/21/2014   Procedure: DILATATION & CURETTAGE/HYSTEROSCOPY WITH THERMACHOICE ABLATION;  Surgeon: Jonnie Kind, MD;  Location: [facility];  Service: Gynecology;  Laterality: N/A;  . KNEE ARTHROSCOPY Left 10/16/2015   Procedure: ARTHROSCOPY KNEE with debridment;  Surgeon: Frederik Pear, MD;  Location: [facility];  Service: Orthopedics;  Laterality: Left;  . KNEE ARTHROSCOPY WITH MEDIAL MENISECTOMY Right 07/17/2015   Procedure: KNEE ARTHROSCOPY WITH MEDIAL MENISECTOMY;  Surgeon: Frederik Pear, MD;  Location: [facility];  Service: Orthopedics;  Laterality: Right;  . POLYPECTOMY N/A 08/21/2014   Procedure: ENDOMETRIAL POLYPECTOMY;  Surgeon: Jonnie Kind, MD;  Location: [facility];  Service: Gynecology;  Laterality: N/A;  . REFRACTIVE SURGERY Right    micro aneuysms  . ULNAR NERVE TRANSPOSITION Right 11/24/2017   Procedure: RIGHT ULNAR NERVE DECOMPRESSION/TRANSPOSITION;  Surgeon: Ashok Pall, MD;  Location: [facility];  Service: Neurosurgery;  Laterality: Right;  Right ULNAR NERVE DECOMPRESSION/TRANSPOSITION     OB History    Gravida  0   Para      Term      Preterm      AB      Living        SAB      TAB      Ectopic      Multiple      Live Births               Home Medications    Prior to Admission medications   Medication Sig Start Date End Date Taking? Authorizing Provider  acetaminophen (TYLENOL) 500 MG tablet Take 1,000 mg by mouth every 6 (six) hours as needed for mild pain or headache.   Yes [provider]  albuterol (PROVENTIL HFA;VENTOLIN HFA) 108 (90 Base) MCG/ACT inhaler Inhale 2 puffs into the lungs every 6 (six) hours as needed for wheezing or shortness of breath.    Yes [provider]  amitriptyline (ELAVIL) 100 MG tablet Take 100 mg by mouth at bedtime.   Yes [provider]  benazepril (LOTENSIN) 20 MG tablet Take 30 mg by mouth every evening.    Yes  [provider]  bisacodyl (DULCOLAX) 5 MG EC tablet Take 5 mg by mouth daily.   Yes [provider]  bumetanide (BUMEX) 0.5 MG tablet Take 1 mg by mouth 2 (two) times daily.    Yes [provider]  cholecalciferol (VITAMIN D) 400 units TABS tablet Take 800 Units by mouth 2 (two) times daily.   Yes [provider]  ciclopirox (LOPROX) 0.77 % cream Apply 1 application topically 2 (two) times daily. For 30 days should be completed 12-13-17   Yes [provider]  CLOBETASOL PROPIONATE E 0.05 % emollient cream Apply 1 application to affected area (  bites) 2 times daily for 2 weeks then stop.Avoid Face and Groin 12/09/17  Yes [provider]  DULoxetine (CYMBALTA) 60 MG capsule Take 1 capsule (60 mg total) by mouth daily. Patient taking differently: Take 60 mg by mouth at bedtime.  12/14/13  Yes Marcial Pacas, MD  Exenatide ER (BYDUREON) 2 MG PEN Inject 1 Dose into the skin once a week. Patient taking differently: Inject 2 mg into the skin once a week. On Sundays 09/16/17  Yes Nicholas Lose, MD  hydrocortisone cream 1 % Apply to affected area 2 times daily Patient taking differently: Apply 1 application topically daily as needed for itching (bites). Apply to affected area 2 times daily 08/12/15  Yes Gloriann Loan, PA-C  hydrOXYzine (ATARAX/VISTARIL) 25 MG tablet Take 25 mg by mouth at bedtime.   Yes [provider]  insulin aspart (NOVOLOG) 100 UNIT/ML injection Inject 2-15 Units into the skin 2 (two) times daily before a meal. Per sliding scale for BS is 121 or greater   Yes [provider]  insulin glargine (LANTUS) 100 UNIT/ML injection Inject 0.28 mLs (28 Units total) into the skin at bedtime. Patient taking differently: Inject 38 Units into the skin at bedtime.  09/17/16  Yes Nicholas Lose, MD  Iron-FA-B Cmp-C-Biot-Probiotic (FUSION PLUS) CAPS Take 1 capsule by mouth daily.    Yes [provider]  lidocaine (LIDODERM) 5 % Place 1  patch onto the skin daily as needed (pain). Remove & Discard patch within 12 hours or as directed by MD   Yes [provider]  Magnesium 250 MG TABS Take 250 mg by mouth 2 (two) times daily.   Yes [provider]  meloxicam (MOBIC) 15 MG tablet Take 15 mg by mouth daily as needed for pain.   Yes [provider]  methocarbamol (ROBAXIN) 500 MG tablet Take 500 mg by mouth 2 (two) times daily.   Yes [provider]  metoprolol tartrate (LOPRESSOR) 25 MG tablet Take 25 mg by mouth daily with breakfast.    Yes [provider]  Omega-3 Fatty Acids (EQL OMEGA 3 FISH OIL) 1400 MG CAPS Take 1,400 mg by mouth every evening.   Yes [provider]  rosuvastatin (CRESTOR) 40 MG tablet Take 40 mg by mouth every evening.    Yes [provider]  sitaGLIPtin-metformin (JANUMET) 50-1000 MG tablet Take 1 tablet by mouth 2 (two) times daily with a meal.   Yes [provider]  traMADol (ULTRAM) 50 MG tablet Take 50 mg by mouth daily.    Yes [provider]  ACCU-CHEK AVIVA PLUS test strip use 1 test strips 2 times daily 01/31/18   [provider]  acetaminophen-codeine (TYLENOL #3) 300-30 MG tablet Take 1 tablet by mouth every 6 (six) hours as needed for moderate pain. Patient not taking: Reported on 03/13/2018 11/24/17   Ashok Pall, MD  Blood Glucose Monitoring Suppl (ACCU-CHEK AVIVA PLUS) w/Device KIT USE TO CHECK BLOOD SUGAR 01/25/18   [provider]  NOVOFINE PLUS 32G X 4 MM MISC USE 1 PEN NEEDLE 3 TIMES DAILY 12/01/17   [provider]    Family History Family History  Problem Relation Age of Onset  . Breast cancer Paternal Grandmother 79       breast; dbl mastectomy  . Diabetes Father   . Heart disease Father   . Hypertension Father   . Heart attack Father   . Diabetes Mother   . Heart disease Mother   . Hypertension Mother   .  Diabetes Sister   . Fibromyalgia Sister   . Diabetes Sister   . Heart  attack Maternal Grandmother        <35  . Heart attack Maternal Grandfather   . Heart attack Paternal Grandfather   . Diabetes Brother     Social History Social History   Tobacco Use  . Smoking status: Never Smoker  . Smokeless tobacco: Never Used  Substance Use Topics  . Alcohol use: No  . Drug use: No     Allergies   Other; Penicillins; and Seasonal ic [cholestatin]   Review of Systems Review of Systems  Neurological: Positive for tremors.  All other systems reviewed and are negative.    Physical Exam Updated Vital Signs BP (!) 162/92 (BP Location: Left Arm)   Pulse 82   Temp 98 F (36.7 C) (Oral)   Resp 16   Ht 5' 3.5" (1.613 m)   Wt 112.5 kg (248 lb)   SpO2 100%   BMI 43.24 kg/m   Physical Exam  Constitutional: She is oriented to person, place, and time. She appears well-developed and well-nourished.  HENT:  Head: Normocephalic and atraumatic.  Mouth/Throat: Oropharynx is clear and moist.  Eyes: Pupils are equal, round, and reactive to light. Conjunctivae and EOM are normal.  Neck: Normal range of motion.  Cardiovascular: Normal rate, regular rhythm and normal heart sounds.  Pulmonary/Chest: Effort normal and breath sounds normal.  Abdominal: Soft. Bowel sounds are normal.  Musculoskeletal: Normal range of motion.  Neurological: She is alert and oriented to person, place, and time.  AAOx3, answering questions and following commands appropriately; equal strength UE and LE bilaterally; intermittent tremors noted of the hands noted during exam, right more prominent than left; CN grossly intact; moves all extremities appropriately without ataxia; no focal neuro deficits or facial asymmetry appreciated; speech is clear, goal oriented Patellar and bicep reflexes are normal bilaterally  Skin: Skin is warm and dry.  Psychiatric: She has a normal mood and affect.  Nursing note and vitals reviewed.    ED Treatments / Results  Labs (all labs ordered are  listed, but only abnormal results are displayed) Labs Reviewed  CBC - Abnormal; Notable for the following components:      Result Value   WBC 11.4 (*)    Hemoglobin 11.2 (*)    HCT 35.1 (*)    All other components within normal limits  BASIC METABOLIC PANEL - Abnormal; Notable for the following components:   Glucose, Bld 245 (*)    Creatinine, Ser 1.22 (*)    GFR calc non Af Amer 52 (*)    GFR calc Af Amer 60 (*)    All other components within normal limits  TSH  VITAMIN B12  FOLATE    EKG None  Radiology No results found.  Procedures Procedures (including critical care time)  Medications Ordered in ED Medications - No data to display   Initial Impression / Assessment and Plan / ED Course  I have reviewed the triage vital signs and the nursing notes.  Pertinent labs & imaging results that were available during my care of the patient were reviewed by me and considered in my medical decision making (see chart for details).  47 year old female here with tremors.  On chart review, it seems she has been having this issue for years.  She does report increased frequency for the past few months.  Has had carpal tunnel and ulnar nerve decompression on right arm.  On exam here  she does have some tremors of the hands, no noticeable in the right but these are intermittent.  She remains awake and alert during these and does not seem consistent with seizure activity.  She has no strength or sensory deficit.  Reflexes are normal.  Screening labs were sent from triage and are overall reassuring.  Patient had MRI and EEG in January 2019 without any acute findings to explain her ongoing symptoms.  Symptoms are somewhat atypical.    I have discussed case with on-call neurology, Dr. Lawerance Bach symptoms are atypical.  Given that she has had recent MRI and EEG that were normal and symptoms are not consistent with TIA, stroke, or seizure, does not feel repeat MRI or EEG needs to be performed at  this time.  Did recommend adding B12, folate, and TSH which have been sent.  Patient was encouraged to follow-up closely with her neurologist.  Discussed plan with patient, he/she acknowledged understanding and agreed with plan of care.  Return precautions given for new or worsening symptoms.  Final Clinical Impressions(s) / ED Diagnoses   Final diagnoses:  Tremor    ED Discharge Orders    None       Larene Pickett, PA-C 18/98/42 1031    Delora Fuel, MD 28/11/88 [phone]

## 2018-03-13 NOTE — ED Notes (Signed)
Nurse currently drawing labs 

## 2018-03-13 NOTE — ED Notes (Signed)
Discharge instructions discussed with Pt. Pt verbalized understanding. Pt stable and leaving via wheelchair.   

## 2018-03-13 NOTE — Discharge Instructions (Signed)
We have sent some additional blood work that will come back later today or early tomorrow morning. Please follow-up with your neurologist and have him check up on this blood work. You may return here for any new/acute changes.

## 2018-03-22 ENCOUNTER — Encounter: Payer: Self-pay | Admitting: Neurology

## 2018-03-25 NOTE — Progress Notes (Signed)
Subjective:   Alicia Coffey was seen in consultation in the movement disorder clinic at the request of Alicia Coffey, *.  The evaluation is for tremor.  However, based on the patient's description and notes, this is not really tremor.  Patient is having discrete episodes of involuntary limb shaking.  Shaking can involve either arm and can involve the entire body.  She was seen by the emergency room on March 13, 2018 after she had 1 of these events at a ball game.  Patient denies any loss or alteration of consciousness.  Patient had an EEG in January that was normal.    Pt states that an "event" comes out of nowhere.  It can happen right after she first gets up or after she walks for a long time.  The longer she sits the more likely it is to happen.  She will then have shaking of the legs or arms or both.  "One time, it was just my right side."  She has had other episodes where it is the entire body.  She is mentally aware during an event.  She is able to speak.  Sitting down will help it go away and she has never fallen because of an episode.  An episode will be short lived if she can sit down for a while.  The longest event will be several minutes.  Events can occur up to 1-2 times per day.  The heat will "amplify" an event.  Unsure if ever had an even when she wasn't hot.  Has normal blood sugar during an event.  Has never taken BP during an event.  Events started in 2015 or 2016 when in [place] but seem to be more frequent now than in the past.  States that a spell starts with feeling of weakness (but is often in the heat) and then will be followed by the shaking.  Once she gets cool, the spell will go away.  She doesn't know if stress affects a spell.  On tramadol - takes 1 time per day.  Started on this in 2015.  She has never had a spell while sitting down.     Outside reports reviewed: historical medical records, office notes and referral letter/letters.  Allergies  Allergen Reactions    . Other Itching    Powered gloves  . Penicillins     Has patient had a PCN reaction causing immediate rash, facial/tongue/throat swelling, SOB or lightheadedness with hypotension: Yes Has patient had a PCN reaction causing severe rash involving mucus membranes or skin necrosis: No Has patient had a PCN reaction that required hospitalization No Has patient had a PCN reaction occurring within the last 10 years: No If all of the above answers are "NO", then may proceed with Cephalosporin use.   . Seasonal Ic [Cholestatin]     Sneezing, runny nose    Outpatient Encounter Medications as of 03/29/2018  Medication Sig  . ACCU-CHEK AVIVA PLUS test strip use 1 test strips 2 times daily  . acetaminophen (TYLENOL) 500 MG tablet Take 1,000 mg by mouth every 6 (six) hours as needed for mild pain or headache.  Marland Kitchen acetaminophen-codeine (TYLENOL #3) 300-30 MG tablet Take 1 tablet by mouth every 6 (six) hours as needed for moderate pain.  Marland Kitchen albuterol (PROVENTIL HFA;VENTOLIN HFA) 108 (90 Base) MCG/ACT inhaler Inhale 2 puffs into the lungs every 6 (six) hours as needed for wheezing or shortness of breath.   Marland Kitchen amitriptyline (ELAVIL) 100 MG tablet  Take 100 mg by mouth at bedtime.  . benazepril (LOTENSIN) 20 MG tablet Take 30 mg by mouth every evening.   . bisacodyl (DULCOLAX) 5 MG EC tablet Take 5 mg by mouth daily.  . Blood Glucose Monitoring Suppl (ACCU-CHEK AVIVA PLUS) w/Device KIT USE TO CHECK BLOOD SUGAR  . bumetanide (BUMEX) 0.5 MG tablet Take 1 mg by mouth 2 (two) times daily.   . cholecalciferol (VITAMIN D) 400 units TABS tablet Take 800 Units by mouth 2 (two) times daily.  . ciclopirox (LOPROX) 0.77 % cream Apply 1 application topically 2 (two) times daily. For 30 days should be completed 12-13-17  . CLOBETASOL PROPIONATE E 0.05 % emollient cream Apply 1 application to affected area (bites) 2 times daily for 2 weeks then stop.Avoid Face and Groin  . DULoxetine (CYMBALTA) 60 MG capsule Take 1 capsule (60  mg total) by mouth daily. (Patient taking differently: Take 60 mg by mouth at bedtime. )  . Exenatide ER (BYDUREON) 2 MG PEN Inject 1 Dose into the skin once a week. (Patient taking differently: Inject 2 mg into the skin once a week. On Sundays)  . hydrocortisone cream 1 % Apply to affected area 2 times daily (Patient taking differently: Apply 1 application topically daily as needed for itching (bites). Apply to affected area 2 times daily)  . hydrOXYzine (ATARAX/VISTARIL) 25 MG tablet Take 25 mg by mouth at bedtime.  . insulin aspart (NOVOLOG) 100 UNIT/ML injection Inject 2-15 Units into the skin 2 (two) times daily before a meal. Per sliding scale for BS is 121 or greater  . insulin glargine (LANTUS) 100 UNIT/ML injection Inject 0.28 mLs (28 Units total) into the skin at bedtime. (Patient taking differently: Inject 38 Units into the skin at bedtime. )  . Iron-FA-B Cmp-C-Biot-Probiotic (FUSION PLUS) CAPS Take 1 capsule by mouth daily.   Marland Kitchen lidocaine (LIDODERM) 5 % Place 1 patch onto the skin daily as needed (pain). Remove & Discard patch within 12 hours or as directed by MD  . Magnesium 250 MG TABS Take 250 mg by mouth 2 (two) times daily.  . meloxicam (MOBIC) 15 MG tablet Take 15 mg by mouth daily as needed for pain.  . methocarbamol (ROBAXIN) 500 MG tablet Take 500 mg by mouth 2 (two) times daily.  . metoprolol tartrate (LOPRESSOR) 25 MG tablet Take 25 mg by mouth daily with breakfast.   . NOVOFINE PLUS 32G X 4 MM MISC USE 1 PEN NEEDLE 3 TIMES DAILY  . Omega-3 Fatty Acids (EQL OMEGA 3 FISH OIL) 1400 MG CAPS Take 1,400 mg by mouth every evening.  . rosuvastatin (CRESTOR) 40 MG tablet Take 40 mg by mouth every evening.   . sitaGLIPtin-metformin (JANUMET) 50-1000 MG tablet Take 1 tablet by mouth 2 (two) times daily with a meal.  . traMADol (ULTRAM) 50 MG tablet Take 50 mg by mouth daily.    No facility-administered encounter medications on file as of 03/29/2018.     Past Medical History:    Diagnosis Date  . Anemia   . Anxiety   . Arthritis    right knee  . Cancer (Alicia Coffey)    calcifications in R breast - biopsy, took tamoxifen - completed 2018  . Depression   . Diabetes mellitus    IDDM  Type 2  . Disc degeneration, lumbar   . Fibromyalgia   . Hyperlipidemia   . Hypertension    under control, has been on med. > 1 yr.  Hinton Dyer body movements  while awake and asleep  . 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 Proteinuria    [facility] in [place] following pt.   . Shortness of breath dyspnea    with exertion  . Vertigo 2015  . Wears dentures    upper  . Wears partial dentures    lower    Past Surgical History:  Procedure Laterality Date  . ABLATION     uterine   . BREAST BIOPSY Left 2014   benign  . BREAST BIOPSY Right 2013   high risk  . BREAST LUMPECTOMY WITH NEEDLE LOCALIZATION  08/15/2012   Procedure: BREAST LUMPECTOMY WITH NEEDLE LOCALIZATION;  Surgeon: [place] Lasso, MD;  Location: [facility];  Service: General;  Laterality: Right;  Wire localizations Right breast calcifications  . BREAST SURGERY    . CARPAL TUNNEL RELEASE Right 11/24/2017   Procedure: RIGHT CARPAL TUNNEL RELEASE;  Surgeon: Ashok Pall, MD;  Location: [facility];  Service: Neurosurgery;  Laterality: Right;  Right CARPAL TUNNEL RELEASE  . DILITATION & CURRETTAGE/HYSTROSCOPY WITH THERMACHOICE ABLATION N/A 08/21/2014   Procedure: DILATATION & CURETTAGE/HYSTEROSCOPY WITH THERMACHOICE ABLATION;  Surgeon: Jonnie Kind, MD;  Location: [facility];  Service: Gynecology;  Laterality: N/A;  . KNEE ARTHROSCOPY Left 10/16/2015   Procedure: ARTHROSCOPY KNEE with debridment;  Surgeon: Frederik Pear, MD;  Location: [facility];  Service: Orthopedics;  Laterality: Left;  . KNEE ARTHROSCOPY WITH MEDIAL MENISECTOMY Right 07/17/2015   Procedure: KNEE ARTHROSCOPY WITH MEDIAL MENISECTOMY;  Surgeon: Frederik Pear, MD;   Location: [facility];  Service: Orthopedics;  Laterality: Right;  . POLYPECTOMY N/A 08/21/2014   Procedure: ENDOMETRIAL POLYPECTOMY;  Surgeon: Jonnie Kind, MD;  Location: [facility];  Service: Gynecology;  Laterality: N/A;  . REFRACTIVE SURGERY Right    micro aneuysms  . ULNAR NERVE TRANSPOSITION Right 11/24/2017   Procedure: RIGHT ULNAR NERVE DECOMPRESSION/TRANSPOSITION;  Surgeon: Ashok Pall, MD;  Location: [facility];  Service: Neurosurgery;  Laterality: Right;  Right ULNAR NERVE DECOMPRESSION/TRANSPOSITION    Social History   Socioeconomic History  . Marital status: Single    Spouse name: Not on file  . Number of children: Not on file  . Years of education: college  . Highest education level: Not on file  Occupational History  . Occupation: care giver    Employer: [employer]  Social Needs  . Financial resource strain: Not on file  . Food insecurity:    Worry: Not on file    Inability: Not on file  . Transportation needs:    Medical: Not on file    Non-medical: Not on file  Tobacco Use  . Smoking status: Never Smoker  . Smokeless tobacco: Never Used  Substance and Sexual Activity  . Alcohol use: No  . Drug use: No  . Sexual activity: Not Currently    Birth control/protection: None, Abstinence  Lifestyle  . Physical activity:    Days per week: Not on file    Minutes per session: Not on file  . Stress: Not on file  Relationships  . Social connections:    Talks on phone: Not on file    Gets together: Not on file    Attends religious service: Not on file    Active member of club or organization: Not on file    Attends meetings of clubs or organizations: Not on file    Relationship status: Not on file  .  Intimate partner violence:    Fear of current or ex partner: Not on file    Emotionally abused: Not on file    Physically abused: Not on file    Forced sexual activity: Not on file  Other Topics Concern  . Not on file  Social History  Narrative   Patient lives at home with her mother and she is single.   Patient works for [employer] .   Education college    Right handed   Caffeine none     Family Status  Relation Name Status  . PGM  Alive  . Father  Deceased at age 25  . Mother  Alive  . Sister Arts development officer  . Sister Darcel Bayley  . MGM  Deceased  . MGF  Deceased  . PGF  Deceased  . Brother [employer]itor  . Brother [employer]  . Brother [employer]  . Brother Motorola    Review of Systems ROS   Objective:   VITALS:   Vitals:   03/29/18 0823  BP: (!) 142/90  Pulse: 92  SpO2: 98%  Weight: 254 lb (115.2 kg)  Height: 5' 3.5" (1.613 m)   Gen:  Appears stated age and in NAD. HEENT:  Normocephalic, atraumatic. The mucous membranes are moist. The superficial temporal arteries are without ropiness or tenderness. Cardiovascular: Regular rate and rhythm. Lungs: Clear to auscultation bilaterally. Neck: There are no carotid bruits noted bilaterally.  NEUROLOGICAL:  Orientation:  The patient is alert and oriented x 3.  Recent and remote memory are intact.  Attention span and concentration are normal.  Able to name objects and repeat without trouble.  Fund of knowledge is appropriate Cranial nerves: There is good facial symmetry. The pupils are equal round and reactive to light bilaterally. Fundoscopic exam reveals clear disc margins bilaterally. Extraocular muscles are intact and visual fields are full to confrontational testing. Speech is fluent and clear. Soft palate rises symmetrically and there is no tongue deviation. Hearing is intact to conversational tone. Tone: Tone is good throughout. Sensation: Sensation is intact to light touch and pinprick throughout (facial, trunk, extremities). Vibration is intact at the bilateral big ankle but overall decreased distally. There is no extinction with double simultaneous stimulation. There is no sensory dermatomal level identified. Coordination:   The patient has no dysdiadichokinesia or dysmetria. Motor: Strength is 5/5 in the bilateral upper and lower extremities.  Shoulder shrug is equal bilaterally.  There is no pronator drift.  There are no fasciculations noted. DTR's: Deep tendon reflexes are 2-/4 at the bilateral biceps, triceps, brachioradialis, patella and absent at the bilateral achilles.  Plantar responses are downgoing bilaterally. Gait and Station: The patient pushes off of the chair to arise.  She has an antalgic gait.  She somewhat drags the left leg with ambulation.  MOVEMENT EXAM: Tremor:  There is no rest tremor.  There is no intention tremor.  There is no leg tremor when walking.     Assessment/Plan:   1.  Episodes of involuntary shaking  -do not sound like a primary movement disorder.  Doesn't describe tremor.  Describes episodes of weakness when ambulating, followed by spells of tremulousness.  This does not sound like orthostatic tremor.  She and I talked about paying attention to see if this only occurs when she is hot.  We talked about taking her blood pressure when it happens.  We talked about trying to capture an event on video.  She did bring  a video with her today and I reviewed it, but I was unable to see any of the event as there were many people around her and it appeared to me that she was just walking normally.  -I did tell the patient that I would recommend cardiac work-up and possible Holter monitor as she does have some palpitations associated with the events and describes near syncope with dizziness.  If that is negative, then ambulatory EEG and/or EMU monitoring could be considered to see if an event could be captured well.  Safety discussed with her/mom.  She has a f/u appt soon with [facility] and will discuss with her physican whether or not to pursue any of these options.    F/u prn.  Much greater than 50% of this visit was spent in counseling and coordinating care.  Total face to face time:  45  min   CC:  Vonna Drafts, FNP

## 2018-03-29 ENCOUNTER — Encounter: Payer: Self-pay | Admitting: Neurology

## 2018-03-29 ENCOUNTER — Ambulatory Visit: Payer: Medicaid Other | Admitting: Neurology

## 2018-03-29 VITALS — BP 142/90 | HR 92 | Ht 63.5 in | Wt 254.0 lb

## 2018-03-29 DIAGNOSIS — R55 Syncope and collapse: Secondary | ICD-10-CM | POA: Diagnosis not present

## 2018-03-29 DIAGNOSIS — R002 Palpitations: Secondary | ICD-10-CM

## 2018-05-30 ENCOUNTER — Ambulatory Visit: Payer: Medicaid Other | Admitting: Podiatry

## 2018-06-01 ENCOUNTER — Ambulatory Visit: Payer: Medicaid Other | Admitting: Podiatry

## 2018-06-20 ENCOUNTER — Encounter: Payer: Self-pay | Admitting: Podiatry

## 2018-06-20 ENCOUNTER — Ambulatory Visit: Payer: Medicaid Other | Admitting: Podiatry

## 2018-06-20 DIAGNOSIS — B351 Tinea unguium: Secondary | ICD-10-CM

## 2018-06-20 DIAGNOSIS — L989 Disorder of the skin and subcutaneous tissue, unspecified: Secondary | ICD-10-CM | POA: Diagnosis not present

## 2018-06-20 DIAGNOSIS — E0843 Diabetes mellitus due to underlying condition with diabetic autonomic (poly)neuropathy: Secondary | ICD-10-CM

## 2018-06-20 DIAGNOSIS — M79676 Pain in unspecified toe(s): Secondary | ICD-10-CM

## 2018-06-20 DIAGNOSIS — M79609 Pain in unspecified limb: Principal | ICD-10-CM

## 2018-06-21 NOTE — Progress Notes (Signed)
    Subjective: Patient is a 47 y.o. female presenting to the office today with a chief complaint of painful callus lesions to the bilateral feet that have been present for several months. She has not done anything to treat the symptoms at home. Walking and bearing weight increase the pain.   Patient also complains of elongated, thickened nails that cause pain while ambulating in shoes. She is unable to trim her own nails. Patient presents today for further treatment and evaluation.  Past Medical History:  Diagnosis Date  . Anemia   . Anxiety   . Arthritis    right knee  . Cancer ([facility])    calcifications in R breast - biopsy, took tamoxifen - completed 2018  . Depression   . Diabetes mellitus    IDDM  Type 2  . Disc degeneration, lumbar   . Fibromyalgia   . Hyperlipidemia   . Hypertension    under control, has been on med. > 1 yr.  Hinton Dyer body movements    while awake and asleep  . 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 Proteinuria    [facility] in [place] following pt.   . Shortness of breath dyspnea    with exertion  . Vertigo 2015  . Wears dentures    upper  . Wears partial dentures    lower    Objective:  Physical Exam General: Alert and oriented x3 in no acute distress  Dermatology: Hyperkeratotic lesions present on the bilateral feet x 3. Pain on palpation with a central nucleated core noted. Skin is warm, dry and supple bilateral lower extremities. Negative for open lesions or macerations. Nails are tender, long, thickened and dystrophic with subungual debris, consistent with onychomycosis, 1-5 bilateral. No signs of infection noted.  Vascular: Palpable pedal pulses bilaterally. No edema or erythema noted. Capillary refill within normal limits.  Neurological: Epicritic and protective threshold grossly intact bilaterally.   Musculoskeletal Exam: Pain on palpation at the keratotic lesion noted.  Range of motion within normal limits bilateral. Muscle strength 5/5 in all groups bilateral.  Assessment: 1. Onychodystrophic nails 1-5 bilateral with hyperkeratosis of nails.  2. Onychomycosis of nail due to dermatophyte bilateral 3. Pre-ulcerative callus lesions to the bilateral feet x 3   Plan of Care:  #1 Patient evaluated. #2 Excisional debridement of keratoic lesion using a chisel blade was performed without incident.  #3 Dressed with light dressing. #4 Mechanical debridement of nails 1-5 bilaterally performed using a nail nipper. Filed with dremel without incident.  #5 Patient is to return to the clinic in 3 months.   Edrick Kins, DPM [facility]  Dr. Edrick Kins, [address]                                        [place], [place] [address]                Office [phone]  Fax [phone]

## 2018-07-29 ENCOUNTER — Other Ambulatory Visit: Payer: Self-pay | Admitting: Neurosurgery

## 2018-08-22 NOTE — Pre-Procedure Instructions (Signed)
Alicia Coffey  08/22/2018      [facility], [place] - [place], [place] - [address] [place] [address] Phone: [phone] Fax: [phone]    Your procedure is scheduled on Wed., Nov. 20, 2019 from 9:00AM-9:47AM  Report to [facility] Admitting Entrance "A" at 7:00AM  Call this number if you have problems the morning of surgery:  [phone]   Remember:  Do not eat or drink after midnight on Nov. 19th    Take these medicines the morning of surgery with A SIP OF WATER: ALLERGY, Methocarbamol (ROBAXIN), Metoprolol tartrate (LOPRESSOR), and TraMADol (ULTRAM) If needed: Acetaminophen (TYLENOL) and Albuterol Inhaler- bring with you the day of surgery  7 days before surgery (08/24/18), stop taking all Other Aspirin Products, Vitamins, Fish oils, and Herbal medications. Also stop all NSAIDS i.e. Advil, Ibuprofen, Motrin, Aleve, Anaprox, Naproxen, BC, Goody Powders, and all Supplements. Including: FERRE and Meloxicam (MOBIC)   How to Manage Your Diabetes Before and After Surgery  Why is it important to control my blood sugar before and after surgery? . Improving blood sugar levels before and after surgery helps healing and can limit problems. . A way of improving blood sugar control is eating a healthy diet by: o  Eating less sugar and carbohydrates o  Increasing activity/exercise o  Talking with your doctor about reaching your blood sugar goals . High blood sugars (greater than 180 mg/dL) can raise your risk of infections and slow your recovery, so you will need to focus on controlling your diabetes during the weeks before surgery. . Make sure that the doctor who takes care of your diabetes knows about your planned surgery including the date and location.  How do I manage my blood sugar before surgery? . Check your blood sugar at least 4 times a day, starting 2 days before surgery, to make sure that the level is not too high or  low. o Check your blood sugar the morning of your surgery when you wake up and every 2 hours until you get to the Short Stay unit. . If your blood sugar is less than 70 mg/dL, you will need to treat for low blood sugar: o Do not take insulin. o Treat a low blood sugar (less than 70 mg/dL) with  cup of clear juice (cranberry or apple), 4 glucose tablets, OR glucose gel. Recheck blood sugar in 15 minutes after treatment (to make sure it is greater than 70 mg/dL). If your blood sugar is not greater than 70 mg/dL on recheck, call [phone] o  for further instructions. .  If your CBG is greater than 220 mg/dL, you may take  of your sliding scale (correction) dose of insulin.  . If you are admitted to the hospital after surgery: o Your blood sugar will be checked by the staff and you will probably be given insulin after surgery (instead of oral diabetes medicines) to make sure you have good blood sugar levels. o The goal for blood sugar control after surgery is 80-180 mg/dL.  WHAT DO I DO ABOUT MY DIABETES MEDICATION?  Marland Kitchen Do not take SitaGLIPtin-metformin (JANUMET) the morning of surgery.  . THE NIGHT BEFORE SURGERY, take ____19_______ units of _____Lantus______insulin.  . The day of surgery, do not take other diabetes injectable Bydureon (exenatide ER)  Reviewed and Endorsed by [facility] Patient Education Committee, August 2015   Do not wear jewelry, make-up or nail polish.  Do not wear lotions, powders,  or perfumes, or deodorant.  Do not shave 48 hours prior to surgery.   Do not bring valuables to the hospital.  [facility] is not responsible for any belongings or valuables.  Contacts, dentures or bridgework may not be worn into surgery.  Leave your suitcase in the car.  After surgery it may be brought to your room.  For patients admitted to the hospital, discharge time will be determined by your treatment team.  Patients discharged the day of surgery will not be allowed to drive  home.   Special instructions:   [facility]- Preparing For Surgery  Before surgery, you can play an important role. Because skin is not sterile, your skin needs to be as free of germs as possible. You can reduce the number of germs on your skin by washing with CHG (chlorahexidine gluconate) Soap before surgery.  CHG is an antiseptic cleaner which kills germs and bonds with the skin to continue killing germs even after washing.    Oral Hygiene is also important to reduce your risk of infection.  Remember - BRUSH YOUR TEETH THE MORNING OF SURGERY WITH YOUR REGULAR TOOTHPASTE  Please do not use if you have an allergy to CHG or antibacterial soaps. If your skin becomes reddened/irritated stop using the CHG.  Do not shave (including legs and underarms) for at least 48 hours prior to first CHG shower. It is OK to shave your face.  Please follow these instructions carefully.   1. Shower the NIGHT BEFORE SURGERY and the MORNING OF SURGERY with CHG.   2. If you chose to wash your hair, wash your hair first as usual with your normal shampoo.  3. After you shampoo, rinse your hair and body thoroughly to remove the shampoo.  4. Use CHG as you would any other liquid soap. You can apply CHG directly to the skin and wash gently with a scrungie or a clean washcloth.   5. Apply the CHG Soap to your body ONLY FROM THE NECK DOWN.  Do not use on open wounds or open sores. Avoid contact with your eyes, ears, mouth and genitals (private parts). Wash Face and genitals (private parts)  with your normal soap.  6. Wash thoroughly, paying special attention to the area where your surgery will be performed.  7. Thoroughly rinse your body with warm water from the neck down.  8. DO NOT shower/wash with your normal soap after using and rinsing off the CHG Soap.  9. Pat yourself dry with a CLEAN TOWEL.  10. Wear CLEAN PAJAMAS to bed the night before surgery, wear comfortable clothes the morning of  surgery  11. Place CLEAN SHEETS on your bed the night of your first shower and DO NOT SLEEP WITH PETS.  Day of Surgery:  Do not apply any deodorants/lotions.  Please wear clean clothes to the hospital/surgery center.   Remember to brush your teeth WITH YOUR REGULAR TOOTHPASTE.  Please read over the following fact sheets that you were given. Pain Booklet, Coughing and Deep Breathing and Surgical Site Infection Prevention

## 2018-08-23 ENCOUNTER — Other Ambulatory Visit: Payer: Self-pay

## 2018-08-23 ENCOUNTER — Encounter (HOSPITAL_COMMUNITY)
Admission: RE | Admit: 2018-08-23 | Discharge: 2018-08-23 | Disposition: A | Discharge status: Home / Self Care | Payer: Medicaid Other | Source: Ambulatory Visit | Attending: Neurosurgery | Admitting: Neurosurgery

## 2018-08-23 ENCOUNTER — Encounter (HOSPITAL_COMMUNITY): Payer: Self-pay

## 2018-08-23 DIAGNOSIS — Z01812 Encounter for preprocedural laboratory examination: Secondary | ICD-10-CM | POA: Diagnosis present

## 2018-08-23 HISTORY — DX: Chronic kidney disease, unspecified: N18.9

## 2018-08-23 LAB — BASIC METABOLIC PANEL
ANION GAP: 9 (ref 5–15)
BUN: 20 mg/dL (ref 6–20)
CHLORIDE: 101 mmol/L (ref 98–111)
CO2: 26 mmol/L (ref 22–32)
Calcium: 9.1 mg/dL (ref 8.9–10.3)
Creatinine, Ser: 1.2 mg/dL — ABNORMAL HIGH (ref 0.44–1.00)
GFR calc Af Amer: >60 mL/min (ref >60)
GFR calc non Af Amer: 52 mL/min — ABNORMAL LOW (ref >60)
Glucose, Bld: 193 mg/dL — ABNORMAL HIGH (ref 70–99)
POTASSIUM: 4 mmol/L (ref 3.5–5.1)
SODIUM: 136 mmol/L (ref 135–145)

## 2018-08-23 LAB — CBC
HEMATOCRIT: 36.9 % (ref 36.0–46.0)
HEMOGLOBIN: 11.8 g/dL — AB (ref 12.0–15.0)
MCH: 28.6 pg (ref 26.0–34.0)
MCHC: 32 g/dL (ref 30.0–36.0)
MCV: 89.3 fL (ref 80.0–100.0)
Platelets: 185 10*3/uL (ref 150–400)
RBC: 4.13 MIL/uL (ref 3.87–5.11)
RDW: 13 % (ref 11.5–15.5)
WBC: 12.1 10*3/uL — AB (ref 4.0–10.5)
nRBC: 0 % (ref 0.0–0.2)

## 2018-08-23 LAB — GLUCOSE, CAPILLARY: Glucose-Capillary: 189 mg/dL — ABNORMAL HIGH (ref 70–99)

## 2018-08-23 LAB — HEMOGLOBIN A1C
Hgb A1c MFr Bld: 7.5 % — ABNORMAL HIGH (ref 4.8–5.6)
Mean Plasma Glucose: 168.55 mg/dL

## 2018-08-23 NOTE — Progress Notes (Signed)
PCP - Dr. [name]  Cardiologist - Denies  Chest x-ray - 10/05/17 (E)  EKG - 11/24/17 (E)  Stress Test - Denies  ECHO - Denies  Cardiac Cath - Denies  AICD- na PM- na LOOP- na  Sleep Study - Yes- Neg CPAP - None  LABS- 08/23/18: CBC, BMP 08/31/18: POC UPreg  ASA- Denies  HA1C- 08/23/18 Fasting Blood Sugar - 88-130, today 189 Checks Blood Sugar __1___ times a day  Anesthesia- No  Pt denies having chest pain, sob, or fever at this time. All instructions explained to the pt, with a verbal understanding of the material. Pt agrees to go over the instructions while at home for a better understanding. The opportunity to ask questions was provided.

## 2018-08-30 MED ORDER — VANCOMYCIN HCL 10 G IV SOLR
1500.0000 mg | INTRAVENOUS | Status: AC
  Administered 2018-08-31: 1500 mg via INTRAVENOUS
  Filled 2018-08-30: qty 1500

## 2018-08-30 NOTE — Anesthesia Preprocedure Evaluation (Addendum)
Anesthesia Evaluation  Patient identified by MRN, date of birth, ID band Patient awake    Reviewed: Allergy & Precautions, NPO status , Patient's Chart, lab work & pertinent test results  Airway Mallampati: III  TM Distance: >3 FB Neck ROM: Full    Dental no notable dental hx. (+) Upper Dentures, Lower Dentures, Dental Advisory Given   Pulmonary neg pulmonary ROS,    Pulmonary exam normal breath sounds clear to auscultation       Cardiovascular hypertension, Pt. on medications and Pt. on home beta blockers Normal cardiovascular exam Rhythm:Regular Rate:Normal  EKG - SR   Neuro/Psych  Neuromuscular disease negative psych ROS   GI/Hepatic   Endo/Other  diabetes, Well Controlled, Type 1Morbid obesity  Renal/GU      Musculoskeletal  (+) Arthritis , Fibromyalgia -  Abdominal (+) + obese,   Peds  Hematology  (+) anemia ,   Anesthesia Other Findings   Reproductive/Obstetrics                            Lab Results  Component Value Date   WBC 12.1 (H) 08/23/2018   HGB 11.8 (L) 08/23/2018   HCT 36.9 08/23/2018   MCV 89.3 08/23/2018   PLT 185 08/23/2018    Anesthesia Physical Anesthesia Plan  ASA: III  Anesthesia Plan: MAC   Post-op Pain Management:    Induction:   PONV Risk Score and Plan: 2 and Treatment may vary due to age or medical condition  Airway Management Planned: Natural Airway and Nasal Cannula  Additional Equipment:   Intra-op Plan:   Post-operative Plan:   Informed Consent: I have reviewed the patients History and Physical, chart, labs and discussed the procedure including the risks, benefits and alternatives for the proposed anesthesia with the patient or authorized representative who has indicated his/her understanding and acceptance.     Plan Discussed with: CRNA  Anesthesia Plan Comments:         Anesthesia Quick Evaluation

## 2018-08-31 ENCOUNTER — Encounter (HOSPITAL_COMMUNITY)
Admission: RE | Disposition: A | Discharge status: Home / Self Care | Payer: Self-pay | Source: Ambulatory Visit | Attending: Neurosurgery

## 2018-08-31 ENCOUNTER — Ambulatory Visit (HOSPITAL_COMMUNITY)
Admission: RE | Admit: 2018-08-31 | Discharge: 2018-08-31 | Disposition: A | Discharge status: Home / Self Care | Payer: Medicaid Other | Source: Ambulatory Visit | Attending: Neurosurgery | Admitting: Neurosurgery

## 2018-08-31 ENCOUNTER — Ambulatory Visit (HOSPITAL_COMMUNITY): Payer: Medicaid Other | Admitting: Anesthesiology

## 2018-08-31 ENCOUNTER — Encounter (HOSPITAL_COMMUNITY): Payer: Self-pay

## 2018-08-31 ENCOUNTER — Other Ambulatory Visit: Payer: Self-pay

## 2018-08-31 DIAGNOSIS — F329 Major depressive disorder, single episode, unspecified: Secondary | ICD-10-CM | POA: Insufficient documentation

## 2018-08-31 DIAGNOSIS — Z791 Long term (current) use of non-steroidal anti-inflammatories (NSAID): Secondary | ICD-10-CM | POA: Insufficient documentation

## 2018-08-31 DIAGNOSIS — N189 Chronic kidney disease, unspecified: Secondary | ICD-10-CM | POA: Insufficient documentation

## 2018-08-31 DIAGNOSIS — I129 Hypertensive chronic kidney disease with stage 1 through stage 4 chronic kidney disease, or unspecified chronic kidney disease: Secondary | ICD-10-CM | POA: Insufficient documentation

## 2018-08-31 DIAGNOSIS — M797 Fibromyalgia: Secondary | ICD-10-CM | POA: Insufficient documentation

## 2018-08-31 DIAGNOSIS — E785 Hyperlipidemia, unspecified: Secondary | ICD-10-CM | POA: Insufficient documentation

## 2018-08-31 DIAGNOSIS — E1122 Type 2 diabetes mellitus with diabetic chronic kidney disease: Secondary | ICD-10-CM | POA: Diagnosis not present

## 2018-08-31 DIAGNOSIS — Z794 Long term (current) use of insulin: Secondary | ICD-10-CM | POA: Diagnosis not present

## 2018-08-31 DIAGNOSIS — Z6841 Body Mass Index (BMI) 40.0 and over, adult: Secondary | ICD-10-CM | POA: Insufficient documentation

## 2018-08-31 DIAGNOSIS — Z853 Personal history of malignant neoplasm of breast: Secondary | ICD-10-CM | POA: Diagnosis not present

## 2018-08-31 DIAGNOSIS — F419 Anxiety disorder, unspecified: Secondary | ICD-10-CM | POA: Insufficient documentation

## 2018-08-31 DIAGNOSIS — Z79899 Other long term (current) drug therapy: Secondary | ICD-10-CM | POA: Diagnosis not present

## 2018-08-31 DIAGNOSIS — G5602 Carpal tunnel syndrome, left upper limb: Secondary | ICD-10-CM | POA: Diagnosis present

## 2018-08-31 DIAGNOSIS — M1990 Unspecified osteoarthritis, unspecified site: Secondary | ICD-10-CM | POA: Diagnosis not present

## 2018-08-31 HISTORY — PX: CARPAL TUNNEL RELEASE: SHX101

## 2018-08-31 LAB — GLUCOSE, CAPILLARY
Glucose-Capillary: 128 mg/dL — ABNORMAL HIGH (ref 70–99)
Glucose-Capillary: 97 mg/dL (ref 70–99)

## 2018-08-31 LAB — POCT PREGNANCY, URINE: Preg Test, Ur: NEGATIVE

## 2018-08-31 SURGERY — CARPAL TUNNEL RELEASE
Anesthesia: Monitor Anesthesia Care | Site: Wrist | Laterality: Left

## 2018-08-31 MED ORDER — FENTANYL CITRATE (PF) 250 MCG/5ML IJ SOLN
INTRAMUSCULAR | Status: AC
  Filled 2018-08-31: qty 5

## 2018-08-31 MED ORDER — FENTANYL CITRATE (PF) 100 MCG/2ML IJ SOLN
INTRAMUSCULAR | Status: DC | PRN
  Administered 2018-08-31: 50 ug via INTRAVENOUS

## 2018-08-31 MED ORDER — MEPERIDINE HCL 50 MG/ML IJ SOLN: 6.2500 mg | INTRAMUSCULAR | Status: DC | PRN

## 2018-08-31 MED ORDER — HYDROMORPHONE HCL 1 MG/ML IJ SOLN: 0.2500 mg | INTRAMUSCULAR | Status: DC | PRN

## 2018-08-31 MED ORDER — PROPOFOL 10 MG/ML IV BOLUS
INTRAVENOUS | Status: AC
  Filled 2018-08-31: qty 20

## 2018-08-31 MED ORDER — LIDOCAINE 2% (20 MG/ML) 5 ML SYRINGE
INTRAMUSCULAR | Status: DC | PRN
  Administered 2018-08-31: 40 mg via INTRAVENOUS

## 2018-08-31 MED ORDER — LIDOCAINE-EPINEPHRINE 0.5 %-1:200000 IJ SOLN
INTRAMUSCULAR | Status: AC
  Filled 2018-08-31: qty 1

## 2018-08-31 MED ORDER — CHLORHEXIDINE GLUCONATE CLOTH 2 % EX PADS: 6.0000 | MEDICATED_PAD | Freq: Once | CUTANEOUS | Status: DC

## 2018-08-31 MED ORDER — MIDAZOLAM HCL 2 MG/2ML IJ SOLN
INTRAMUSCULAR | Status: AC
  Filled 2018-08-31: qty 2

## 2018-08-31 MED ORDER — PROMETHAZINE HCL 25 MG/ML IJ SOLN: 6.2500 mg | INTRAMUSCULAR | Status: DC | PRN

## 2018-08-31 MED ORDER — PHENYLEPHRINE 40 MCG/ML (10ML) SYRINGE FOR IV PUSH (FOR BLOOD PRESSURE SUPPORT)
PREFILLED_SYRINGE | INTRAVENOUS | Status: AC
  Filled 2018-08-31: qty 30

## 2018-08-31 MED ORDER — PROPOFOL 500 MG/50ML IV EMUL
INTRAVENOUS | Status: DC | PRN
  Administered 2018-08-31: 75 ug/kg/min via INTRAVENOUS

## 2018-08-31 MED ORDER — BACITRACIN ZINC 500 UNIT/GM EX OINT
TOPICAL_OINTMENT | CUTANEOUS | Status: DC | PRN
  Administered 2018-08-31: 1 via TOPICAL

## 2018-08-31 MED ORDER — ONDANSETRON HCL 4 MG/2ML IJ SOLN
INTRAMUSCULAR | Status: AC
  Filled 2018-08-31: qty 2

## 2018-08-31 MED ORDER — LIDOCAINE 2% (20 MG/ML) 5 ML SYRINGE
INTRAMUSCULAR | Status: AC
  Filled 2018-08-31: qty 5

## 2018-08-31 MED ORDER — ACETAMINOPHEN 10 MG/ML IV SOLN: 1000.0000 mg | Freq: Once | INTRAVENOUS | Status: DC | PRN

## 2018-08-31 MED ORDER — LIDOCAINE-EPINEPHRINE 0.5 %-1:200000 IJ SOLN
INTRAMUSCULAR | Status: DC | PRN
  Administered 2018-08-31: 5 mL

## 2018-08-31 MED ORDER — HYDROCODONE-ACETAMINOPHEN 7.5-325 MG PO TABS: 1.0000 | ORAL_TABLET | Freq: Once | ORAL | Status: DC | PRN

## 2018-08-31 MED ORDER — ACETAMINOPHEN-CODEINE #3 300-30 MG PO TABS: 1.0000 | ORAL_TABLET | Freq: Four times a day (QID) | ORAL | 0 refills | Status: DC | PRN

## 2018-08-31 MED ORDER — LACTATED RINGERS IV SOLN
INTRAVENOUS | Status: DC
  Administered 2018-08-31: 08:00:00 via INTRAVENOUS

## 2018-08-31 MED ORDER — GABAPENTIN 300 MG PO CAPS
300.0000 mg | ORAL_CAPSULE | Freq: Once | ORAL | Status: AC
  Administered 2018-08-31: 300 mg via ORAL
  Filled 2018-08-31: qty 1

## 2018-08-31 MED ORDER — MIDAZOLAM HCL 5 MG/5ML IJ SOLN
INTRAMUSCULAR | Status: DC | PRN
  Administered 2018-08-31: 2 mg via INTRAVENOUS

## 2018-08-31 MED ORDER — 0.9 % SODIUM CHLORIDE (POUR BTL) OPTIME
TOPICAL | Status: DC | PRN
  Administered 2018-08-31: 1000 mL

## 2018-08-31 MED ORDER — BACITRACIN ZINC 500 UNIT/GM EX OINT
TOPICAL_OINTMENT | CUTANEOUS | Status: AC
  Filled 2018-08-31: qty 28.35

## 2018-08-31 MED ORDER — ROCURONIUM BROMIDE 50 MG/5ML IV SOSY
PREFILLED_SYRINGE | INTRAVENOUS | Status: AC
  Filled 2018-08-31: qty 5

## 2018-08-31 MED ORDER — ACETAMINOPHEN 500 MG PO TABS
1000.0000 mg | ORAL_TABLET | Freq: Once | ORAL | Status: AC
  Administered 2018-08-31: 1000 mg via ORAL
  Filled 2018-08-31: qty 2

## 2018-08-31 SURGICAL SUPPLY — 37 items
BANDAGE ACE 3X5.8 VEL STRL LF (GAUZE/BANDAGES/DRESSINGS) IMPLANT
BANDAGE ELASTIC 3 VELCRO ST LF (GAUZE/BANDAGES/DRESSINGS) ×2 IMPLANT
BLADE SURG 15 STRL LF DISP TIS (BLADE) ×1 IMPLANT
BLADE SURG 15 STRL SS (BLADE) ×1
BNDG GAUZE ELAST 4 BULKY (GAUZE/BANDAGES/DRESSINGS) ×2 IMPLANT
CABLE BIPOLOR RESECTION CORD (MISCELLANEOUS) ×2 IMPLANT
COVER WAND RF STERILE (DRAPES) ×2 IMPLANT
DECANTER SPIKE VIAL GLASS SM (MISCELLANEOUS) ×2 IMPLANT
DRAPE EXTREMITY T 121X128X90 (DRAPE) ×2 IMPLANT
DRAPE HALF SHEET 40X57 (DRAPES) ×2 IMPLANT
DRSG TELFA 3X8 NADH (GAUZE/BANDAGES/DRESSINGS) ×2 IMPLANT
DURAPREP 26ML APPLICATOR (WOUND CARE) ×2 IMPLANT
GAUZE 4X4 16PLY RFD (DISPOSABLE) ×2 IMPLANT
GLOVE BIOGEL PI IND STRL 7.5 (GLOVE) ×1 IMPLANT
GLOVE BIOGEL PI INDICATOR 7.5 (GLOVE) ×1
GLOVE ECLIPSE 6.5 STRL STRAW (GLOVE) ×2 IMPLANT
GLOVE SURG SS PI 7.5 STRL IVOR (GLOVE) ×8 IMPLANT
GOWN STRL REUS W/ TWL LRG LVL3 (GOWN DISPOSABLE) ×2 IMPLANT
GOWN STRL REUS W/ TWL XL LVL3 (GOWN DISPOSABLE) IMPLANT
GOWN STRL REUS W/TWL 2XL LVL3 (GOWN DISPOSABLE) IMPLANT
GOWN STRL REUS W/TWL LRG LVL3 (GOWN DISPOSABLE) ×2
GOWN STRL REUS W/TWL XL LVL3 (GOWN DISPOSABLE)
KIT BASIN OR (CUSTOM PROCEDURE TRAY) ×2 IMPLANT
KIT TURNOVER KIT B (KITS) ×2 IMPLANT
NEEDLE HYPO 25X1 1.5 SAFETY (NEEDLE) ×2 IMPLANT
NS IRRIG 1000ML POUR BTL (IV SOLUTION) ×2 IMPLANT
PACK SURGICAL SETUP 50X90 (CUSTOM PROCEDURE TRAY) ×2 IMPLANT
PAD ARMBOARD 7.5X6 YLW CONV (MISCELLANEOUS) ×6 IMPLANT
STOCKINETTE 4X48 STRL (DRAPES) ×2 IMPLANT
SUT ETHILON 3 0 PS 1 (SUTURE) ×2 IMPLANT
SYR BULB 3OZ (MISCELLANEOUS) ×2 IMPLANT
SYR CONTROL 10ML LL (SYRINGE) ×2 IMPLANT
TOWEL GREEN STERILE (TOWEL DISPOSABLE) ×2 IMPLANT
TOWEL GREEN STERILE FF (TOWEL DISPOSABLE) ×2 IMPLANT
TUBE CONNECTING 12X1/4 (SUCTIONS) ×2 IMPLANT
UNDERPAD 30X30 (UNDERPADS AND DIAPERS) ×2 IMPLANT
WATER STERILE IRR 1000ML POUR (IV SOLUTION) ×2 IMPLANT

## 2018-08-31 NOTE — Transfer of Care (Signed)
Immediate Anesthesia Transfer of Care Note  Patient: Alicia Coffey  Procedure(s) Performed: LEFT CARPAL TUNNEL RELEASE (Left Wrist)  Patient Location: PACU  Anesthesia Type:MAC  Level of Consciousness: drowsy  Airway & Oxygen Therapy: Patient Spontanous Breathing and Patient connected to nasal cannula oxygen  Post-op Assessment: Report given to RN and Post -op Vital signs reviewed and stable  Post vital signs: Reviewed and stable  Last Vitals:  Vitals Value Taken Time  BP 120/57 08/31/2018 10:37 AM  Temp    Pulse 79 08/31/2018 10:39 AM  Resp 17 08/31/2018 10:39 AM  SpO2 100 % 08/31/2018 10:39 AM  Vitals shown include unvalidated device data.  Last Pain:  Vitals:   08/31/18 1035  PainSc: (P) Asleep         Complications: No apparent anesthesia complications

## 2018-08-31 NOTE — Op Note (Signed)
   10:50 AM  PATIENT:  Alicia Coffey  47 y.o. female With left carpal tunnel syndrome PRE-OPERATIVE DIAGNOSIS:  Left Carpal Tunnel Syndrome  POST-OPERATIVE DIAGNOSIS:  Left Carpal Tunnel Syndrome  PROCEDURE:  Procedure(s): LEFT CARPAL TUNNEL RELEASE  SURGEON: Surgeon(s): Ashok Pall, MD  ANESTHESIA:   local and IV sedation  EBL:  Total I/O In: 900 [I.V.:400; IV Piggyback:500] Out: 5 [Blood:5]  COUNT:per nursing  DICTATION: [name] was taken to the operating room, given IV sedation, and positioned on the operating room table. female had their Left upper extremity prepped and draped in a sterile manner. I infiltrated Licodcaine  2/0% 8/022,336 strength epinephrine into the planned incision starting at the proximal palmar crease extending into the hand ~ 1.5cm. I opened the skin with a 15 blade and extended the incision through the skin into the subcutaneous tissue. I used the bipolar cautery to control the subcutaneous bleeding. I dissected sharply through the tissue using forceps also to expose the transverse carpal ligament. I divided the transverse carpal ligament sharply with the 15 blade using the forceps to protect the contents of the carpal tunnel. With the scissors I divided the ligament both proximally and distally to decompress the entire carpal tunnel. I used the scissors to dissect into the forearm to create space to divide the ligament to the proximal palmar crease, and distally into the palm.  I irrigated the wound then closed the incision with vertical interrupted vertical mattress sutures. I placed a sterile dressing, then wrapped the proximal hand and distal forearm with an ace wrap.  PLAN OF CARE: Discharge to home after PACU  PATIENT DISPOSITION:  PACU - hemodynamically stable.   Delay start of Pharmacological VTE agent (>24hrs) due to surgical blood loss or risk of bleeding:  yes

## 2018-08-31 NOTE — Anesthesia Procedure Notes (Signed)
Procedure Name: MAC Date/Time: 08/31/2018 10:01 AM Performed by: Colin Benton, CRNA Pre-anesthesia Checklist: Patient identified, Emergency Drugs available, Suction available and Patient being monitored Patient Re-evaluated:Patient Re-evaluated prior to induction Oxygen Delivery Method: Nasal cannula Induction Type: IV induction Placement Confirmation: positive ETCO2 Dental Injury: Teeth and Oropharynx as per pre-operative assessment

## 2018-08-31 NOTE — Anesthesia Postprocedure Evaluation (Signed)
Anesthesia Post Note  Patient: Alicia Coffey  Procedure(s) Performed: LEFT CARPAL TUNNEL RELEASE (Left Wrist)     Patient location during evaluation: PACU Anesthesia Type: MAC Level of consciousness: awake and alert Pain management: pain level controlled Vital Signs Assessment: post-procedure vital signs reviewed and stable Respiratory status: spontaneous breathing, nonlabored ventilation, respiratory function stable and patient connected to nasal cannula oxygen Cardiovascular status: stable and blood pressure returned to baseline Postop Assessment: no apparent nausea or vomiting Anesthetic complications: no    Last Vitals:  Vitals:   08/31/18 1125 08/31/18 1155  BP: (!) 152/90 124/81  Pulse: 74 76  Resp: 15 18  Temp: 36.5 C   SpO2: 99% 99%    Last Pain:  Vitals:   08/31/18 1155  PainSc: 0-No pain                 Barnet Glasgow

## 2018-08-31 NOTE — Discharge Instructions (Addendum)

## 2018-08-31 NOTE — H&P (Signed)
BP 110/63   Pulse 87   Resp 20   Ht 5' 3.5" (1.613 m)   Wt 116.1 kg   SpO2 100%   BMI 44.64 kg/m  GENIVA LOHNES is a 47 y.o. female Presents with carpal tunnel syndrome in the left hand.  Allergies  Allergen Reactions  . Penicillins Other (See Comments)    PATIENT HAS HAD A PCN REACTION WITH IMMEDIATE RASH, FACIAL/TONGUE/THROAT SWELLING, SOB, OR LIGHTHEADEDNESS WITH HYPOTENSION:  #  #  YES  #  #  Has patient had a PCN reaction causing severe rash involving mucus membranes or skin necrosis: No Has patient had a PCN reaction that required hospitalization No Has patient had a PCN reaction occurring within the last 10 years: No   . Other Hives, Itching and Rash    Powered gloves Arthropod Insect  . Seasonal Ic [Cholestatin] Other (See Comments)    Sneezing, runny nose   Past Medical History:  Diagnosis Date  . Anemia   . Anxiety   . Arthritis    right knee  . Cancer ([place])    calcifications in R breast - biopsy, took tamoxifen - completed 2018  . Chronic kidney disease   . Depression   . Diabetes mellitus     Type 2  . Disc degeneration, lumbar   . Fibromyalgia   . Hyperlipidemia   . Hypertension    under control, has been on med. > 1 yr.  [place] body movements    while awake and asleep  . 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 Proteinuria    [facility] in [place] following pt.   . Shortness of breath dyspnea    with exertion  . Vertigo 2015  . Wears dentures    upper  . Wears partial dentures    lower   Past Surgical History:  Procedure Laterality Date  . ABLATION     uterine   . BREAST BIOPSY Left 2014   benign  . BREAST BIOPSY Right 2013   high risk  . BREAST LUMPECTOMY WITH NEEDLE LOCALIZATION  08/15/2012   Procedure: BREAST LUMPECTOMY WITH NEEDLE LOCALIZATION;  Surgeon: Haywood Lasso, MD;  Location: [facility];  Service: General;  Laterality: Right;  Wire  localizations Right breast calcifications  . BREAST SURGERY    . CARPAL TUNNEL RELEASE Right 11/24/2017   Procedure: RIGHT CARPAL TUNNEL RELEASE;  Surgeon: Ashok Pall, MD;  Location: [place];  Service: Neurosurgery;  Laterality: Right;  Right CARPAL TUNNEL RELEASE  . DILITATION & CURRETTAGE/HYSTROSCOPY WITH THERMACHOICE ABLATION N/A 08/21/2014   Procedure: DILATATION & CURETTAGE/HYSTEROSCOPY WITH THERMACHOICE ABLATION;  Surgeon: Jonnie Kind, MD;  Location: [facility];  Service: Gynecology;  Laterality: N/A;  . KNEE ARTHROSCOPY Left 10/16/2015   Procedure: ARTHROSCOPY KNEE with debridment;  Surgeon: Frederik Pear, MD;  Location: [facility];  Service: Orthopedics;  Laterality: Left;  . KNEE ARTHROSCOPY WITH MEDIAL MENISECTOMY Right 07/17/2015   Procedure: KNEE ARTHROSCOPY WITH MEDIAL MENISECTOMY;  Surgeon: Frederik Pear, MD;  Location: [facility];  Service: Orthopedics;  Laterality: Right;  . POLYPECTOMY N/A 08/21/2014   Procedure: ENDOMETRIAL POLYPECTOMY;  Surgeon: Jonnie Kind, MD;  Location: [facility];  Service: Gynecology;  Laterality: N/A;  . REFRACTIVE SURGERY Right    micro aneuysms  . ULNAR NERVE TRANSPOSITION Right 11/24/2017   Procedure: RIGHT ULNAR NERVE DECOMPRESSION/TRANSPOSITION;  Surgeon: Ashok Pall, MD;  Location:  [facility];  Service: Neurosurgery;  Laterality: Right;  Right ULNAR NERVE DECOMPRESSION/TRANSPOSITION   Family History  Problem Relation Age of Onset  . Breast cancer Paternal Grandmother 23       breast; dbl mastectomy  . Diabetes Father   . Heart disease Father   . Hypertension Father   . Heart attack Father   . Diabetes Mother   . Heart disease Mother   . Hypertension Mother   . Diabetes Sister   . Fibromyalgia Sister   . Diabetes Sister   . Heart attack Maternal Grandmother        <35  . Heart attack Maternal Grandfather   . Heart attack Paternal Grandfather   . Diabetes Brother    Social History   Socioeconomic History  .  Marital status: Single    Spouse name: Not on file  . Number of children: Not on file  . Years of education: college  . Highest education level: Not on file  Occupational History  . Occupation: care giver    Employer: [employer]  Social Needs  . Financial resource strain: Not on file  . Food insecurity:    Worry: Not on file    Inability: Not on file  . Transportation needs:    Medical: Not on file    Non-medical: Not on file  Tobacco Use  . Smoking status: Never Smoker  . Smokeless tobacco: Never Used  Substance and Sexual Activity  . Alcohol use: No  . Drug use: No  . Sexual activity: Not Currently    Birth control/protection: None, Abstinence  Lifestyle  . Physical activity:    Days per week: Not on file    Minutes per session: Not on file  . Stress: Not on file  Relationships  . Social connections:    Talks on phone: Not on file    Gets together: Not on file    Attends religious service: Not on file    Active member of club or organization: Not on file    Attends meetings of clubs or organizations: Not on file    Relationship status: Not on file  . Intimate partner violence:    Fear of current or ex partner: Not on file    Emotionally abused: Not on file    Physically abused: Not on file    Forced sexual activity: Not on file  Other Topics Concern  . Not on file  Social History Narrative   Patient lives at home with her mother and she is single.   Patient works for [employer] .   Education college    Right handed   Caffeine none    Prior to Admission medications   Medication Sig Start Date End Date Taking? Authorizing Provider  acetaminophen (TYLENOL) 500 MG tablet Take 1,000 mg by mouth every 6 (six) hours as needed for mild pain or headache.   Yes [provider]  ALLERGY 10 MG tablet Take 20 mg by mouth daily.  05/26/18  Yes [provider]  amitriptyline (ELAVIL) 100 MG tablet Take 100 mg by mouth at bedtime.    Yes [provider]  benazepril (LOTENSIN) 20 MG tablet Take 30 mg by mouth daily.   Yes [provider]  bisacodyl (DULCOLAX) 5 MG EC tablet Take 5 mg by mouth 2 (two) times daily.    Yes [provider]  bumetanide (BUMEX) 0.5 MG tablet Take 1 mg by mouth 2 (two) times daily.  Yes [provider]  Cholecalciferol 25 MCG (1000 UT) capsule Take 1,000 Units by mouth 2 (two) times daily.   Yes [provider]  ciclopirox (LOPROX) 0.77 % cream Apply 1 application topically 2 (two) times daily as needed (itching).    Yes [provider]  CLOBETASOL PROPIONATE E 0.05 % emollient cream Apply 1 application topically 2 (two) times daily as needed (itching).  12/09/17  Yes [provider]  DULoxetine (CYMBALTA) 60 MG capsule Take 1 capsule (60 mg total) by mouth daily. Patient taking differently: Take 60 mg by mouth at bedtime.  12/14/13  Yes Marcial Pacas, MD  Exenatide ER (BYDUREON) 2 MG PEN Inject 1 Dose into the skin once a week. Patient taking differently: Inject 2 mg into the skin once a week. On Sundays 09/16/17  Yes Nicholas Lose, MD  FERREX 150 150 MG capsule Take 150 mg by mouth 2 (two) times daily.  05/24/18  Yes [provider]  hydrocortisone cream 1 % Apply to affected area 2 times daily Patient taking differently: Apply 1 application topically daily as needed for itching (bites). Apply to affected area 2 times daily 08/12/15  Yes Gloriann Loan, PA-C  hydrOXYzine (ATARAX/VISTARIL) 25 MG tablet Take 25 mg by mouth at bedtime.   Yes [provider]  insulin aspart (NOVOLOG) 100 UNIT/ML injection Inject 2-15 Units into the skin 2 (two) times daily before a meal. Per sliding scale for BS is 121 or greater   Yes [provider]  insulin glargine (LANTUS) 100 UNIT/ML injection Inject 0.28 mLs (28 Units total) into the skin at bedtime. Patient taking differently: Inject 38 Units into the skin at bedtime.  09/17/16  Yes  Nicholas Lose, MD  lidocaine (LIDODERM) 5 % Place 1 patch onto the skin daily as needed (pain). Remove & Discard patch within 12 hours or as directed by MD   Yes [provider]  Magnesium 250 MG TABS Take 250 mg by mouth 2 (two) times daily.   Yes [provider]  meloxicam (MOBIC) 15 MG tablet Take 15 mg by mouth daily as needed for pain.   Yes [provider]  methocarbamol (ROBAXIN) 500 MG tablet Take 500 mg by mouth 2 (two) times daily.   Yes [provider]  metoprolol tartrate (LOPRESSOR) 25 MG tablet Take 25 mg by mouth daily with breakfast.    Yes [provider]  Omega-3 Fatty Acids (EQL OMEGA 3 FISH OIL) 1400 MG CAPS Take 1,400 mg by mouth every evening.   Yes [provider]  OVER THE COUNTER MEDICATION Take 1 capsule by mouth daily. Active Lean Keto Dietary Supplement   Yes [provider]  PREVIDENT 5000 SENSITIVE 1.1-5 % PSTE Take 1 application by mouth See admin instructions. After regular brushing, flossing and rinsing, brush a pea size of this paste for 2 minutes then spit it out thoroughly 05/03/18  Yes [provider]  rosuvastatin (CRESTOR) 40 MG tablet Take 40 mg by mouth at bedtime.    Yes [provider]  sitaGLIPtin-metformin (JANUMET) 50-1000 MG tablet Take 1 tablet by mouth 2 (two) times daily with a meal.   Yes [provider]  traMADol (ULTRAM) 50 MG tablet Take 50 mg by mouth daily.    Yes [provider]  ACCU-CHEK AVIVA PLUS test strip use 1 test strips 2 times daily 01/31/18   [provider]  acetaminophen-codeine (TYLENOL #3) 300-30 MG tablet Take 1 tablet by mouth every 6 (six) hours  as needed for moderate pain. Patient not taking: Reported on 08/17/2018 11/24/17   Ashok Pall, MD  albuterol (PROVENTIL HFA;VENTOLIN HFA) 108 (90 Base) MCG/ACT inhaler Inhale 2 puffs into the lungs every 6 (six) hours as needed for wheezing or shortness of breath.     [provider]  Blood Glucose Monitoring Suppl (ACCU-CHEK AVIVA PLUS) w/Device KIT USE TO CHECK BLOOD SUGAR 01/25/18   [provider]  NOVOFINE PLUS 32G X 4 MM MISC USE 1 PEN NEEDLE 3 TIMES DAILY 12/01/17   [provider]   ROSCHELLE CALANDRA is a 47 y.o. female presents with carpal tunnel syndrome proven by emg. She has elected to undergo operative decompression of the left carpal tunnel. Risks including but not limited to bleeding, nerve damage, no use of the thumb, weakness in the hand, worse pain, no improvement, infection, and other risks were discussed. She understands and wishes to proceed.

## 2018-09-01 ENCOUNTER — Encounter (HOSPITAL_COMMUNITY): Payer: Self-pay | Admitting: Neurosurgery

## 2018-09-15 ENCOUNTER — Telehealth: Payer: Self-pay | Admitting: Adult Health

## 2018-09-15 ENCOUNTER — Inpatient Hospital Stay: Payer: Medicaid Other | Attending: Adult Health | Admitting: Adult Health

## 2018-09-15 ENCOUNTER — Encounter: Payer: Self-pay | Admitting: Adult Health

## 2018-09-15 VITALS — BP 125/77 | HR 90 | Temp 98.6°F | Resp 18 | Ht 63.5 in | Wt 257.9 lb

## 2018-09-15 DIAGNOSIS — Z86000 Personal history of in-situ neoplasm of breast: Secondary | ICD-10-CM | POA: Diagnosis present

## 2018-09-15 DIAGNOSIS — I129 Hypertensive chronic kidney disease with stage 1 through stage 4 chronic kidney disease, or unspecified chronic kidney disease: Secondary | ICD-10-CM | POA: Diagnosis not present

## 2018-09-15 DIAGNOSIS — E1122 Type 2 diabetes mellitus with diabetic chronic kidney disease: Secondary | ICD-10-CM

## 2018-09-15 DIAGNOSIS — Z79899 Other long term (current) drug therapy: Secondary | ICD-10-CM | POA: Diagnosis not present

## 2018-09-15 DIAGNOSIS — N63 Unspecified lump in unspecified breast: Secondary | ICD-10-CM

## 2018-09-15 DIAGNOSIS — R222 Localized swelling, mass and lump, trunk: Secondary | ICD-10-CM | POA: Diagnosis not present

## 2018-09-15 DIAGNOSIS — D0501 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: Secondary | ICD-10-CM

## 2018-09-15 DIAGNOSIS — N189 Chronic kidney disease, unspecified: Secondary | ICD-10-CM | POA: Insufficient documentation

## 2018-09-15 NOTE — Telephone Encounter (Signed)
Gave avs and calendar ° °

## 2018-09-15 NOTE — Progress Notes (Signed)
CLINIC:  Survivorship   REASON FOR VISIT:  Routine follow-up for history of LCIS.   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  Overall, she reports feeling quite well. She does note a new left breast nodule, she doesn't know how long it has been there.  She has several other comorbidities that interfere with her ability to exercise.  She sees her PCP regularly. She works with her PCP frequently about her gyn and colon cancer screenings.       REVIEW OF SYSTEMS:  Review of Systems  Constitutional: Negative for appetite change, chills, fatigue, fever and unexpected weight change.  HENT:   Negative for hearing loss, lump/mass and mouth sores.   Eyes: Negative for eye problems and icterus.  Respiratory: Negative for chest tightness, cough and shortness of breath.   Cardiovascular: Negative for chest pain, leg swelling and palpitations.  Gastrointestinal: Negative for abdominal distention, abdominal pain, constipation, diarrhea, nausea and vomiting.  Endocrine: Negative for hot flashes.  Musculoskeletal: Negative for arthralgias.  Skin: Negative for itching and rash.  Neurological: Negative for dizziness, extremity weakness, headaches and numbness.  Hematological: Negative for adenopathy. Does not bruise/bleed easily.  Psychiatric/Behavioral: Negative for depression. The patient is not nervous/anxious.         PAST MEDICAL/SURGICAL HISTORY:  Past Medical History:  Diagnosis Date  . Anemia   . Anxiety   . Arthritis    right knee  . Cancer ([place])    calcifications in R breast - biopsy, took tamoxifen - completed 2018  . Chronic kidney disease   . Depression   . Diabetes mellitus     Type 2  . Disc degeneration, lumbar   . Fibromyalgia   .  Hyperlipidemia   . Hypertension    under control, has been on med. > 1 yr.  Hinton Dyer body movements    while awake and asleep  . 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 Proteinuria    [facility] in [place] following pt.   . Shortness of breath dyspnea    with exertion  . Vertigo 2015  . Wears dentures    upper  . Wears partial dentures    lower   Past Surgical History:  Procedure Laterality Date  . ABLATION     uterine   . BREAST BIOPSY Left 2014   benign  . BREAST BIOPSY Right 2013   high risk  . BREAST LUMPECTOMY WITH NEEDLE LOCALIZATION  08/15/2012   Procedure: BREAST LUMPECTOMY WITH NEEDLE LOCALIZATION;  Surgeon: Haywood Lasso, MD;  Location: [place];  Service: General;  Laterality: Right;  Wire localizations Right breast calcifications  . BREAST SURGERY    . CARPAL TUNNEL RELEASE Right 11/24/2017   Procedure: RIGHT CARPAL TUNNEL RELEASE;  Surgeon: Ashok Pall, MD;  Location: [place];  Service: Neurosurgery;  Laterality: Right;  Right CARPAL TUNNEL RELEASE  . CARPAL TUNNEL RELEASE Left 08/31/2018   Procedure: LEFT CARPAL TUNNEL RELEASE;  Surgeon: Ashok Pall, MD;  Location: [place];  Service: Neurosurgery;  Laterality: Left;  . DILITATION & CURRETTAGE/HYSTROSCOPY WITH THERMACHOICE ABLATION N/A 08/21/2014   Procedure: DILATATION & CURETTAGE/HYSTEROSCOPY WITH THERMACHOICE ABLATION;  Surgeon: Jonnie Kind, MD;  Location: [facility];  Service: Gynecology;  Laterality: N/A;  . KNEE ARTHROSCOPY Left 10/16/2015   Procedure: ARTHROSCOPY KNEE with debridment;  Surgeon: Frederik Pear, MD;  Location: [place];  Service: Orthopedics;  Laterality: Left;  . KNEE ARTHROSCOPY WITH MEDIAL MENISECTOMY Right 07/17/2015   Procedure: KNEE ARTHROSCOPY WITH MEDIAL MENISECTOMY;  Surgeon: Frederik Pear, MD;  Location: [place];  Service: Orthopedics;  Laterality: Right;  . POLYPECTOMY  N/A 08/21/2014   Procedure: ENDOMETRIAL POLYPECTOMY;  Surgeon: Jonnie Kind, MD;  Location: [facility];  Service: Gynecology;  Laterality: N/A;  . REFRACTIVE SURGERY Right    micro aneuysms  . ULNAR NERVE TRANSPOSITION Right 11/24/2017   Procedure: RIGHT ULNAR NERVE DECOMPRESSION/TRANSPOSITION;  Surgeon: Ashok Pall, MD;  Location: [place];  Service: Neurosurgery;  Laterality: Right;  Right ULNAR NERVE DECOMPRESSION/TRANSPOSITION     ALLERGIES:  Allergies  Allergen Reactions  . Penicillins Other (See Comments)    PATIENT HAS HAD A PCN REACTION WITH IMMEDIATE RASH, FACIAL/TONGUE/THROAT SWELLING, SOB, OR LIGHTHEADEDNESS WITH HYPOTENSION:  #  #  YES  #  #  Has patient had a PCN reaction causing severe rash involving mucus membranes or skin necrosis: No Has patient had a PCN reaction that required hospitalization No Has patient had a PCN reaction occurring within the last 10 years: No   . Other Hives, Itching and Rash    Powered gloves Arthropod Insect  . Seasonal Ic [Cholestatin] Other (See Comments)    Sneezing, runny nose     CURRENT MEDICATIONS:  Outpatient Encounter Medications as of 09/15/2018  Medication Sig  . ACCU-CHEK AVIVA PLUS test strip use 1 test strips 2 times daily  . acetaminophen (TYLENOL) 500 MG tablet Take 1,000 mg by mouth every 6 (six) hours as needed for mild pain or headache.  Marland Kitchen acetaminophen-codeine (TYLENOL #3) 300-30 MG tablet Take 1 tablet by mouth every 6 (six) hours as needed for moderate pain.  Marland Kitchen albuterol (PROVENTIL HFA;VENTOLIN HFA) 108 (90 Base) MCG/ACT inhaler Inhale 2 puffs into the lungs every 6 (six) hours as needed for wheezing or shortness of breath.   . ALLERGY 10 MG tablet Take 20 mg by mouth daily.   Marland Kitchen amitriptyline (ELAVIL) 100 MG tablet Take 100 mg by mouth at bedtime.  . benazepril (LOTENSIN) 20 MG tablet Take 30 mg by mouth daily.  . bisacodyl (DULCOLAX) 5 MG EC tablet Take 5 mg by mouth 2 (two) times daily.   . Blood Glucose Monitoring  Suppl (ACCU-CHEK AVIVA PLUS) w/Device KIT USE TO CHECK BLOOD SUGAR  . bumetanide (BUMEX) 0.5 MG tablet Take 1 mg by mouth 2 (two) times daily.   . Cholecalciferol 25 MCG (1000 UT) capsule Take 1,000 Units by mouth 2 (two) times daily.  . ciclopirox (LOPROX) 0.77 % cream Apply 1 application topically 2 (two) times daily as needed (itching).   . CLOBETASOL PROPIONATE E 0.05 % emollient cream Apply 1 application topically 2 (two) times daily as needed (itching).   . DULoxetine (CYMBALTA) 60 MG capsule Take 1 capsule (60 mg total) by mouth daily. (Patient taking differently: Take 60 mg by mouth at bedtime. )  . Exenatide ER (BYDUREON) 2 MG PEN Inject 1 Dose into the skin once a week. (Patient taking differently: Inject 2 mg into the skin once a week. On Sundays)  . FERREX 150 150 MG capsule Take 150 mg by mouth 2 (two) times daily.   . hydrocortisone cream 1 % Apply to affected area 2 times daily (Patient taking differently: Apply  1 application topically daily as needed for itching (bites). Apply to affected area 2 times daily)  . hydrOXYzine (ATARAX/VISTARIL) 25 MG tablet Take 25 mg by mouth at bedtime.  . insulin aspart (NOVOLOG) 100 UNIT/ML injection Inject 2-15 Units into the skin 2 (two) times daily before a meal. Per sliding scale for BS is 121 or greater  . insulin glargine (LANTUS) 100 UNIT/ML injection Inject 0.28 mLs (28 Units total) into the skin at bedtime. (Patient taking differently: Inject 38 Units into the skin at bedtime. )  . lidocaine (LIDODERM) 5 % Place 1 patch onto the skin daily as needed (pain). Remove & Discard patch within 12 hours or as directed by MD  . Magnesium 250 MG TABS Take 250 mg by mouth 2 (two) times daily.  . meloxicam (MOBIC) 15 MG tablet Take 15 mg by mouth daily as needed for pain.  . methocarbamol (ROBAXIN) 500 MG tablet Take 500 mg by mouth 2 (two) times daily.  . metoprolol tartrate (LOPRESSOR) 25 MG tablet Take 25 mg by mouth daily with breakfast.   .  NOVOFINE PLUS 32G X 4 MM MISC USE 1 PEN NEEDLE 3 TIMES DAILY  . Omega-3 Fatty Acids (EQL OMEGA 3 FISH OIL) 1400 MG CAPS Take 1,400 mg by mouth every evening.  . Ondansetron HCl (ZOFRAN PO) Take by mouth.  Marland Kitchen OVER THE COUNTER MEDICATION Take 1 capsule by mouth daily. Active Lean Keto Dietary Supplement  . PREVIDENT 5000 SENSITIVE 1.1-5 % PSTE Take 1 application by mouth See admin instructions. After regular brushing, flossing and rinsing, brush a pea size of this paste for 2 minutes then spit it out thoroughly  . rosuvastatin (CRESTOR) 40 MG tablet Take 40 mg by mouth at bedtime.   . sitaGLIPtin-metformin (JANUMET) 50-1000 MG tablet Take 1 tablet by mouth 2 (two) times daily with a meal.  . traMADol (ULTRAM) 50 MG tablet Take 50 mg by mouth daily.    No facility-administered encounter medications on file as of 09/15/2018.      ONCOLOGIC FAMILY HISTORY:  Family History  Problem Relation Age of Onset  . Breast cancer Paternal Grandmother 59       breast; dbl mastectomy  . Diabetes Father   . Heart disease Father   . Hypertension Father   . Heart attack Father   . Diabetes Mother   . Heart disease Mother   . Hypertension Mother   . Diabetes Sister   . Fibromyalgia Sister   . Diabetes Sister   . Heart attack Maternal Grandmother        <35  . Heart attack Maternal Grandfather   . Heart attack Paternal Grandfather   . Diabetes Brother      SOCIAL HISTORY:  Social History   Socioeconomic History  . Marital status: Single    Spouse name: Not on file  . Number of children: Not on file  . Years of education: college  . Highest education level: Not on file  Occupational History  . Occupation: care giver    Employer: [employer]  Social Needs  . Financial resource strain: Not on file  . Food insecurity:    Worry: Not on file    Inability: Not on file  . Transportation needs:    Medical: Not on file    Non-medical: Not on file  Tobacco Use  . Smoking status: Never  Smoker  . Smokeless tobacco: Never Used  Substance and Sexual Activity  . Alcohol use: No  .  Drug use: No  . Sexual activity: Not Currently    Birth control/protection: None, Abstinence  Lifestyle  . Physical activity:    Days per week: Not on file    Minutes per session: Not on file  . Stress: Not on file  Relationships  . Social connections:    Talks on phone: Not on file    Gets together: Not on file    Attends religious service: Not on file    Active member of club or organization: Not on file    Attends meetings of clubs or organizations: Not on file    Relationship status: Not on file  . Intimate partner violence:    Fear of current or ex partner: Not on file    Emotionally abused: Not on file    Physically abused: Not on file    Forced sexual activity: Not on file  Other Topics Concern  . Not on file  Social History Narrative   Patient lives at home with her mother and she is single.   Patient works for [employer] .   Education college    Right handed   Caffeine none       PHYSICAL EXAMINATION:  Vital Signs: Vitals:   09/15/18 1532  BP: 125/77  Pulse: 90  Resp: 18  Temp: 98.6 F (37 C)  SpO2: 95%   Filed Weights   09/15/18 1532  Weight: 257 lb 14.4 oz (117 kg)   General: Well-nourished, well-appearing female in no acute distress.  Accompanied by her mother today.   HEENT: Head is normocephalic.  Pupils equal and reactive to light. Conjunctivae clear without exudate.  Sclerae anicteric. Oral mucosa is pink, moist.  Oropharynx is pink without lesions or erythema.  Lymph: No cervical, supraclavicular, or infraclavicular lymphadenopathy noted on palpation.  Cardiovascular: Regular rate and rhythm.Marland Kitchen Respiratory: Clear to auscultation bilaterally. Chest expansion symmetric; breathing non-labored.  Breast Exam:  -Left breast: very faint left breast subcutaneous soft lesion, about 1cm.  Otherwise the left breast is benign -Right breast: No  appreciable masses on palpation. No skin redness, thickening, or peau d'orange appearance; no nipple retraction or nipple discharge; mild distortion in symmetry at previous lumpectomy site well healed scar without erythema or nodularity. -Axilla: No axillary adenopathy bilaterally.  GI: Abdomen soft and round; non-tender, non-distended. Bowel sounds normoactive. No hepatosplenomegaly.   GU: Deferred.  Neuro: No focal deficits. Steady gait.  Psych: Mood and affect normal and appropriate for situation.  MSK: No focal spinal tenderness to palpation, full range of motion in bilateral upper extremities Extremities: No edema. Skin: Warm and dry.  LABORATORY DATA:  None for this visit  DIAGNOSTIC IMAGING:  Most recent mammogram:      ASSESSMENT AND PLAN:  Alicia Coffey is a pleasant 47 y.o. female with history of LCIS s/p lumpectomy and Tamoxifen completed in 2017  She [facility] for surveillance and routine follow-up.   1. History of LCIS:  She is up to date with mammogram. She will return to see me annually.  I encouraged her to call me with any questions or concerns before her next visit at the cancer center, and I would be happy to see her sooner, if needed.    2. Left breast lesion: diagnostic mammogram and ultrasound ordered.  3. Cancer screening:  Due to Alicia Coffey's history and her age, she should receive screening for skin cancers, colon cancer, and gynecologic cancers. She was encouraged to follow-up with her PCP  for appropriate cancer screenings.   4. Health maintenance and wellness promotion: Alicia Coffey was encouraged to consume 5-7 servings of fruits and vegetables per day. She was also encouraged to engage in moderate to vigorous exercise for 30 minutes per day most days of the week. She was instructed to limit her alcohol consumption and continue to abstain from tobacco use.    Dispo:  -Return to cancer center in one year for long term follow up -Mammo  and ultrasound at the breast center   A total of (30) minutes of face-to-face time was spent with this patient with greater than 50% of that time in counseling and care-coordination.   Gardenia Phlegm, NP Survivorship Program [facility] [phone]   Note: PRIMARY CARE PROVIDER Vonna Drafts, [place] [phone]

## 2018-09-19 ENCOUNTER — Ambulatory Visit: Payer: Medicaid Other | Admitting: Podiatry

## 2018-09-19 DIAGNOSIS — M79676 Pain in unspecified toe(s): Secondary | ICD-10-CM | POA: Diagnosis not present

## 2018-09-19 DIAGNOSIS — L989 Disorder of the skin and subcutaneous tissue, unspecified: Secondary | ICD-10-CM | POA: Diagnosis not present

## 2018-09-19 DIAGNOSIS — M79609 Pain in unspecified limb: Principal | ICD-10-CM

## 2018-09-19 DIAGNOSIS — B351 Tinea unguium: Secondary | ICD-10-CM | POA: Diagnosis not present

## 2018-09-19 DIAGNOSIS — E0843 Diabetes mellitus due to underlying condition with diabetic autonomic (poly)neuropathy: Secondary | ICD-10-CM

## 2018-09-20 NOTE — Progress Notes (Signed)
    Subjective: Patient is a 47 y.o. female presenting to the office today with a chief complaint of painful callus lesions to the bilateral feet that have been present for several months. She has not done anything to treat the symptoms at home. Walking and bearing weight increase the pain.   Patient also complains of elongated, thickened nails that cause pain while ambulating in shoes. She is unable to trim her own nails. Patient presents today for further treatment and evaluation.  Past Medical History:  Diagnosis Date  . Anemia   . Anxiety   . Arthritis    right knee  . Cancer ([place])    calcifications in R breast - biopsy, took tamoxifen - completed 2018  . Chronic kidney disease   . Depression   . Diabetes mellitus     Type 2  . Disc degeneration, lumbar   . Fibromyalgia   . Hyperlipidemia   . Hypertension    under control, has been on med. > 1 yr.  Hinton Dyer body movements    while awake and asleep  . 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 Proteinuria    [facility] clinic in [place] following pt.   . Shortness of breath dyspnea    with exertion  . Vertigo 2015  . Wears dentures    upper  . Wears partial dentures    lower    Objective:  Physical Exam General: Alert and oriented x3 in no acute distress  Dermatology: Hyperkeratotic lesions present on the bilateral feet x 4. Pain on palpation with a central nucleated core noted. Skin is warm, dry and supple bilateral lower extremities. Negative for open lesions or macerations. Nails are tender, long, thickened and dystrophic with subungual debris, consistent with onychomycosis, 1-5 bilateral. No signs of infection noted.  Vascular: Palpable pedal pulses bilaterally. No edema or erythema noted. Capillary refill within normal limits.  Neurological: Epicritic and protective threshold grossly intact bilaterally.   Musculoskeletal Exam: Pain on palpation at the  keratotic lesion noted. Range of motion within normal limits bilateral. Muscle strength 5/5 in all groups bilateral.  Assessment: 1. Onychodystrophic nails 1-5 bilateral with hyperkeratosis of nails.  2. Onychomycosis of nail due to dermatophyte bilateral 3. Pre-ulcerative callus lesions to the bilateral feet x 4   Plan of Care:  #1 Patient evaluated. #2 Excisional debridement of keratoic lesion using a chisel blade was performed without incident.  #3 Dressed with light dressing. #4 Mechanical debridement of nails 1-5 bilaterally performed using a nail nipper. Filed with dremel without incident.  #5 Patient is to return to the clinic in 3 months.   Edrick Kins, DPM [facility]  Dr. Edrick Kins, [place]                                        [place], [place] [address]                Office [phone]  Fax [phone]

## 2018-09-22 ENCOUNTER — Ambulatory Visit
Admission: RE | Admit: 2018-09-22 | Discharge: 2018-09-22 | Disposition: A | Discharge status: Home / Self Care | Payer: Medicaid Other | Source: Ambulatory Visit | Attending: Adult Health | Admitting: Adult Health

## 2018-09-22 DIAGNOSIS — N63 Unspecified lump in unspecified breast: Secondary | ICD-10-CM

## 2018-09-22 DIAGNOSIS — D0501 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: Secondary | ICD-10-CM

## 2018-11-04 ENCOUNTER — Ambulatory Visit: Payer: Medicaid Other | Admitting: Physical Therapy

## 2018-11-14 ENCOUNTER — Ambulatory Visit: Payer: Medicaid Other | Attending: Neurosurgery | Admitting: Occupational Therapy

## 2018-11-14 ENCOUNTER — Other Ambulatory Visit: Payer: Self-pay

## 2018-11-14 DIAGNOSIS — M6281 Muscle weakness (generalized): Secondary | ICD-10-CM

## 2018-11-14 DIAGNOSIS — M25532 Pain in left wrist: Secondary | ICD-10-CM | POA: Diagnosis present

## 2018-11-14 DIAGNOSIS — M79642 Pain in left hand: Secondary | ICD-10-CM | POA: Diagnosis present

## 2018-11-14 NOTE — Therapy (Signed)
[facility] [address] [place], [place], [address] Phone: [phone]   Fax:  [phone]  Occupational Therapy Evaluation  Patient Details  Name: Alicia Coffey MRN: [id number] Date of Birth: September 23, 1971 Referring Provider (OT): Dr. Christella Noa   Encounter Date: 11/14/2018  OT End of Session - 11/14/18 1055    Visit Number  1    Number of Visits  12    Date for OT Re-Evaluation  01/10/19    Authorization Type  MCD - awaiting approval    OT Start Time  0800    OT Stop Time  0845    OT Time Calculation (min)  45 min    Activity Tolerance  Patient tolerated treatment well    Behavior During Therapy  [facility] for tasks assessed/performed       Past Medical History:  Diagnosis Date  . Anemia   . Anxiety   . Arthritis    right knee  . Cancer ([place])    calcifications in R breast - biopsy, took tamoxifen - completed 2018  . Chronic kidney disease   . Depression   . Diabetes mellitus     Type 2  . Disc degeneration, lumbar   . Fibromyalgia   . Hyperlipidemia   . Hypertension    under control, has been on med. > 1 yr.  Hinton Dyer body movements    while awake and asleep  . 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 Proteinuria    K[place]ney clinic in [place] following pt.   . Shortness of breath dyspnea    with exertion  . Vertigo 2015  . Wears dentures    upper  . Wears partial dentures    lower    Past Surgical History:  Procedure Laterality Date  . ABLATION     uterine   . BREAST BIOPSY Left 2014   benign  . BREAST BIOPSY Right 2013   high risk  . BREAST LUMPECTOMY WITH NEEDLE LOCALIZATION  08/15/2012   Procedure: BREAST LUMPECTOMY WITH NEEDLE LOCALIZATION;  Surgeon: Haywood Lasso, MD;  Location: [facility];  Service: General;  Laterality: Right;  Wire localizations Right breast calcifications  . BREAST SURGERY    . CARPAL TUNNEL  RELEASE Right 11/24/2017   Procedure: RIGHT CARPAL TUNNEL RELEASE;  Surgeon: Ashok Pall, MD;  Location: [facility];  Service: Neurosurgery;  Laterality: Right;  Right CARPAL TUNNEL RELEASE  . CARPAL TUNNEL RELEASE Left 08/31/2018   Procedure: LEFT CARPAL TUNNEL RELEASE;  Surgeon: Ashok Pall, MD;  Location: [place] [facility];  Service: Neurosurgery;  Laterality: Left;  . DILITATION & CURRETTAGE/HYSTROSCOPY WITH THERMACHOICE ABLATION N/A 08/21/2014   Procedure: DILATATION & CURETTAGE/HYSTEROSCOPY WITH THERMACHOICE ABLATION;  Surgeon: Jonnie Kind, MD;  Location: [facility];  Service: Gynecology;  Laterality: N/A;  . KNEE ARTHROSCOPY Left 10/16/2015   Procedure: ARTHROSCOPY KNEE with debridment;  Surgeon: Frederik Pear, MD;  Location: [facility];  Service: Orthopedics;  Laterality: Left;  . KNEE ARTHROSCOPY WITH MEDIAL MENISECTOMY Right 07/17/2015   Procedure: KNEE ARTHROSCOPY WITH MEDIAL MENISECTOMY;  Surgeon: Frederik Pear, MD;  Location: [place];  Service: Orthopedics;  Laterality: Right;  . POLYPECTOMY N/A 08/21/2014   Procedure: ENDOMETRIAL POLYPECTOMY;  Surgeon: Jonnie Kind, MD;  Location: [facility];  Service: Gynecology;  Laterality: N/A;  . REFRACTIVE SURGERY Right    micro aneuysms  . ULNAR NERVE TRANSPOSITION Right 11/24/2017  Procedure: RIGHT ULNAR NERVE DECOMPRESSION/TRANSPOSITION;  Surgeon: Ashok Pall, MD;  Location: [facility];  Service: Neurosurgery;  Laterality: Right;  Right ULNAR NERVE DECOMPRESSION/TRANSPOSITION    There were no vitals filed for this visit.  Subjective Assessment - 11/14/18 0806    Subjective   I drop things a lot in my Lt hand     Pertinent History  Lt CTR 08/31/18. PMH: RT CTR and Rt ulnar n. decompression 11/24/17, fibromyalgia, bilateral knee arthoscopy    Patient Stated Goals  decrease pain and grip strength to hold things better    Currently in Pain?  Yes    Pain Score  2    up to 10/10 often   Pain Location  Hand    Pain Orientation  Left     Pain Descriptors / Indicators  Throbbing;Burning;Stabbing    Pain Type  Acute pain;Neuropathic pain    Pain Onset  More than a month ago    Pain Frequency  Intermittent    Aggravating Factors   cold weather    Pain Relieving Factors  stretching        [facility] OT Assessment - 11/14/18 0001      Assessment   Medical Diagnosis  LT CTR    Referring Provider (OT)  Dr. Christella Noa    Onset Date/Surgical Date  08/31/18    Hand Dominance  Right    Prior Therapy  none      Precautions   Precaution Comments  no lifting > 10 lbs      Balance Screen   Has the patient fallen in the past 6 months  Yes    How many times?  3-4    Has the patient had a decrease in activity level because of a fear of falling?   No    Is the patient reluctant to leave their home because of a fear of falling?   No      Home  Environment   Lives With  Family   mom and oldest brother     Prior Function   Level of Independence  Independent    Vocation  Unemployed   pending disability   Leisure  reading, singing, writing      ADL   Eating/Feeding  Modified independent    Grooming  Independent   w/ Rt hand   Upper Body Bathing  Independent    Lower Body Bathing  Independent    Upper Body Dressing  Independent    Lower Body Dressing  Independent    Banker -  Hygiene  Independent    Tub/Shower Transfer  Modified independent    Equipment Used  --   shower chair   ADL comments  difficulty gripping, reports dropping items from [place] care of all shopping needs independently    Light Housekeeping  Performs light daily tasks such as dishwashing, bed making;Does personal laundry completely    Meal Prep  Plans, prepares and serves adequate meals independently    Investment banker, corporate own vehicle    Medication Management  Is responsible for taking medication in correct dosages at correct time      Mobility   Mobility Status  History of falls       Written Expression   Dominant Hand  Right    Handwriting  --   reports fatigue and sometimes shaking     Vision -  History   Baseline Vision  Bifocals   take off to read     Observation/Other Assessments   Observations  raised scar over Lt carpal tunnel incision      Sensation   Light Touch  Appears Intact   for light touch/localization   Additional Comments  Pt reports numbness/tingling in small finger and ulnar side of ring finger LUE (consistent w/ ulnar n. distribution      Coordination   9 Hole Peg Test  Right;Left    Right 9 Hole Peg Test  25.59 sec    Left 9 Hole Peg Test  21.75 sec      Edema   Edema  mild at incision only      ROM / Strength   AROM / PROM / Strength  AROM      AROM   Overall AROM Comments  BUE AROM WNL's      Hand Function   Right Hand Grip (lbs)  70 lbs    Right Hand Lateral Pinch  13 lbs    Right Hand 3 Point Pinch  11 lbs    Left Hand Grip (lbs)  55 lbs    Left Hand Lateral Pinch  10 lbs    Left 3 point pinch  8 lbs                      OT Education - 11/14/18 1100    Education Details  Scar massage    Person(s) Educated  Patient    Methods  Explanation;Demonstration    Comprehension  Verbalized understanding       OT Short Term Goals - 11/14/18 1100      OT SHORT TERM GOAL #1   Title  Independent with initial HEP     Baseline  not issued     Time  4    Period  Weeks    Status  New      OT SHORT TERM GOAL #2   Title  Independent with scar massage    Baseline  pt shown how to perform, will need review    Time  4    Period  Weeks    Status  On-going      OT SHORT TERM GOAL #3   Title  Pain consistently less than or equal to 5/10 during functional light activities and BADLS    Baseline  up to 10/10    Time  4    Period  Weeks    Status  New        OT Long Term Goals - 11/14/18 1102      OT LONG TERM GOAL #1   Title  Independent w/ updated HEP    Baseline  Dependent    Time  8    Period   Weeks    Status  New      OT LONG TERM GOAL #2   Title  Grip strength Lt hand to be 62 lbs or greater for gripping, lifting, opening tight jars/containers    Baseline  Lt = 55 lbs    Time  8    Period  Weeks    Status  New      OT LONG TERM GOAL #3   Title  Lateral pinch and 3 tip pinch to increase by 2 lbs each Lt hand    Baseline  Lat = 10, 3 tip = 8     Time  8    Period  Weeks    Status  New      OT LONG TERM GOAL #4   Title  Pt to report less drops Lt hand during functional tasks    Baseline  50% of the time    Time  8    Period  Weeks    Status  New            Plan - 11/14/18 1055    Clinical Impression Statement  Pt is a 48 y.o. female who presents to outpatient O.T. s/p Lt carpal tunnel release (CTR) on 08/31/18 due to CTS. Pt presents today with pain and decreased strength in Lt hand. Pt reports she drops items a lot    Occupational Profile and client history currently impacting functional performance  PMH: Rt CTR 11/24/17, Fibromyalgia    Occupational performance deficits (Please refer to evaluation for details):  IADL's;ADL's;Work    Rehab Potential  Good    OT Frequency  1x / week    OT Duration  --   for 3 weeks, followed by 2x/wk for 4 weeks   OT Treatment/Interventions  Self-care/ADL training;Electrical Stimulation;Therapeutic exercise;Moist Heat;Paraffin;Splinting;Patient/family education;Fluidtherapy;Scar mobilization;Therapeutic activities;Cryotherapy;Ultrasound;DME and/or AE instruction;Manual Therapy;Passive range of motion    Plan  pulsed [place], issue handout on scar massage and tendon gliding ex's, putty HEP    Clinical Decision Making  Limited treatment options, no task modification necessary    Consulted and Agree with Plan of Care  Patient       Patient will benefit from skilled therapeutic intervention in order to improve the following deficits and impairments:  Increased edema, Decreased skin integrity, Pain, Decreased scar mobility, Impaired  sensation, Decreased strength, Impaired UE functional use  Visit Diagnosis: Pain in left hand - Plan: Ot plan of care cert/re-cert  Pain in left wrist - Plan: Ot plan of care cert/re-cert  Muscle weakness (generalized) - Plan: Ot plan of care cert/re-cert    Problem List Patient Active Problem List   Diagnosis Date Noted  . Menorrhagia with irregular cycle 07/25/2014  . Iron deficiency anemia due to chronic blood loss 07/25/2014  . Follow up 05/23/2014  . Fibroid, uterine 04/04/2014  . Abnormal uterine bleeding (AUB) 03/26/2014  . Diabetic peripheral neuropathy ([place]) 12/21/2013  . Low back pain 12/11/2013  . Pain in joint, shoulder region 12/11/2013  . Pain in limb 12/11/2013  . 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 Diabetes ([place]) 07/28/2012  . Hypertension 07/28/2012  . Hypercholesteremia 07/28/2012  . Atypical lobular hyperplasia of breast 07/13/2012    Carey Bullocks, OTR/L 11/14/2018, 11:06 AM  [facility] [address] [place], [place], [address] Phone: [phone]   Fax:  [phone]  Name: Alicia Coffey MRN: [id number] Date of Birth: 25-Jul-1971

## 2018-11-23 ENCOUNTER — Ambulatory Visit: Payer: Medicaid Other | Admitting: Occupational Therapy

## 2018-11-23 DIAGNOSIS — M25532 Pain in left wrist: Secondary | ICD-10-CM

## 2018-11-23 DIAGNOSIS — M79642 Pain in left hand: Secondary | ICD-10-CM

## 2018-11-23 DIAGNOSIS — M6281 Muscle weakness (generalized): Secondary | ICD-10-CM

## 2018-11-23 NOTE — Patient Instructions (Signed)
Massage incision and surrounding area 5 mins 2x day with lotion giving firm pressure Flexor Tendon Gliding (Active Hook Fist)   With fingers and knuckles straight, bend middle and tip joints. Do not bend large knuckles. Repeat _10-15___ times. Do _4-6___ sessions per day.  MP Flexion (Active)   With back of hand on table, bend large knuckles as far as they will go, keeping small joints straight. Repeat _10-15___ times. Do __4-6__ sessions per day. Activity: Reach into a narrow container.*      Finger Flexion / Extension   With palm up, bend fingers of left hand toward palm, making a  fist. Straighten fingers, opening fist. Repeat sequence _10-15___ times per session. Do _4-6__ sessions per day. Hand Variation: Palm down   Copyright  VHI. All rights reserved.   1. Grip Strengthening (Resistive Putty)   Squeeze putty using thumb and all fingers. Repeat _20___ times. Do __2__ sessions per day.   2. Roll putty into tube on table and pinch between each finger and thumb x 10 reps each. (can do ring and small finger together)     Copyright  VHI. All rights reserved.

## 2018-11-23 NOTE — Therapy (Signed)
[facility] [address] [place], [place], [address] Phone: [phone]   Fax:  [phone]  Occupational Therapy Treatment  Patient Details  Name: Alicia Coffey MRN: [id number] Date of Birth: 01-Aug-1971 Referring Provider (OT): Dr. Christella Noa   Encounter Date: 11/23/2018  OT End of Session - 11/23/18 1341    Visit Number  2    Number of Visits  12    Date for OT Re-Evaluation  01/10/19    Authorization Time Period  [employer] 3 visits through 12/13/18    OT Start Time  0805    OT Stop Time  0845    OT Time Calculation (min)  40 min    Activity Tolerance  Patient tolerated treatment well    Behavior During Therapy  [facility] for tasks assessed/performed       Past Medical History:  Diagnosis Date  . Anemia   . Anxiety   . Arthritis    right knee  . Cancer ([place])    calcifications in R breast - biopsy, took tamoxifen - completed 2018  . Chronic kidney disease   . Depression   . Diabetes mellitus     Type 2  . Disc degeneration, lumbar   . Fibromyalgia   . Hyperlipidemia   . Hypertension    under control, has been on med. > 1 yr.  [place] body movements    while awake and asleep  . 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 Proteinuria    KIdney clinic in [place] following pt.   . Shortness of breath dyspnea    with exertion  . Vertigo 2015  . Wears dentures    upper  . Wears partial dentures    lower    Past Surgical History:  Procedure Laterality Date  . ABLATION     uterine   . BREAST BIOPSY Left 2014   benign  . BREAST BIOPSY Right 2013   high risk  . BREAST LUMPECTOMY WITH NEEDLE LOCALIZATION  08/15/2012   Procedure: BREAST LUMPECTOMY WITH NEEDLE LOCALIZATION;  Surgeon: Haywood Lasso, MD;  Location: [facility];  Service: General;  Laterality: Right;  Wire localizations Right breast calcifications  . BREAST SURGERY    .  CARPAL TUNNEL RELEASE Right 11/24/2017   Procedure: RIGHT CARPAL TUNNEL RELEASE;  Surgeon: Ashok Pall, MD;  Location: [facility];  Service: Neurosurgery;  Laterality: Right;  Right CARPAL TUNNEL RELEASE  . CARPAL TUNNEL RELEASE Left 08/31/2018   Procedure: LEFT CARPAL TUNNEL RELEASE;  Surgeon: Ashok Pall, MD;  Location: [place];  Service: Neurosurgery;  Laterality: Left;  . DILITATION & CURRETTAGE/HYSTROSCOPY WITH THERMACHOICE ABLATION N/A 08/21/2014   Procedure: DILATATION & CURETTAGE/HYSTEROSCOPY WITH THERMACHOICE ABLATION;  Surgeon: Jonnie Kind, MD;  Location: [facility];  Service: Gynecology;  Laterality: N/A;  . KNEE ARTHROSCOPY Left 10/16/2015   Procedure: ARTHROSCOPY KNEE with debridment;  Surgeon: Frederik Pear, MD;  Location: [facility];  Service: Orthopedics;  Laterality: Left;  . KNEE ARTHROSCOPY WITH MEDIAL MENISECTOMY Right 07/17/2015   Procedure: KNEE ARTHROSCOPY WITH MEDIAL MENISECTOMY;  Surgeon: Frederik Pear, MD;  Location: [facility];  Service: Orthopedics;  Laterality: Right;  . POLYPECTOMY N/A 08/21/2014   Procedure: ENDOMETRIAL POLYPECTOMY;  Surgeon: Jonnie Kind, MD;  Location[facility];  Service: Gynecology;  Laterality: N/A;  . REFRACTIVE SURGERY Right    micro aneuysms  . ULNAR NERVE TRANSPOSITION  Right 11/24/2017   Procedure: RIGHT ULNAR NERVE DECOMPRESSION/TRANSPOSITION;  Surgeon: Ashok Pall, MD;  Location: [facility];  Service: Neurosurgery;  Laterality: Right;  Right ULNAR NERVE DECOMPRESSION/TRANSPOSITION    There were no vitals filed for this visit.  Subjective Assessment - 11/23/18 1350    Pertinent History  Lt CTR 08/31/18. PMH: RT CTR and Rt ulnar n. decompression 11/24/17, fibromyalgia, bilateral knee arthoscopy    Patient Stated Goals  decrease pain and grip strength to hold things better    Currently in Pain?  Yes    Pain Score  3     Pain Location  Hand    Pain Orientation  Left    Pain Descriptors / Indicators  Aching    Pain Type   Acute pain;Neuropathic pain    Pain Onset  More than a month ago    Pain Frequency  Intermittent    Aggravating Factors   cold weather    Pain Relieving Factors  heat                  Treatment; fluidotherapy x 10 mins to LUE for pain relief, no adverse reactions. [place] 40mhz, 0.8 w/cm 2 x 20 % to palmar inision site for scar mobilization. Scar massage to palm and incision site x 5 mins. Pt was instructed in HEP, see pt instructions.          OT Education - 11/23/18 1348    Education Details  Scar massage, inital HEP for tendon gliding, putty    Person(s) Educated  Patient    Methods  Explanation;Demonstration    Comprehension  Verbalized understanding;Verbal cues required;Returned demonstration       OT Short Term Goals - 11/14/18 1100      OT SHORT TERM GOAL #1   Title  Independent with initial HEP     Baseline  not issued     Time  4    Period  Weeks    Status  New      OT SHORT TERM GOAL #2   Title  Independent with scar massage    Baseline  pt shown how to perform, will need review    Time  4    Period  Weeks    Status  On-going      OT SHORT TERM GOAL #3   Title  Pain consistently less than or equal to 5/10 during functional light activities and BADLS    Baseline  up to 10/10    Time  4    Period  Weeks    Status  New        OT Long Term Goals - 11/14/18 1102      OT LONG TERM GOAL #1   Title  Independent w/ updated HEP    Baseline  Dependent    Time  8    Period  Weeks    Status  New      OT LONG TERM GOAL #2   Title  Grip strength Lt hand to be 62 lbs or greater for gripping, lifting, opening tight jars/containers    Baseline  Lt = 55 lbs    Time  8    Period  Weeks    Status  New      OT LONG TERM GOAL #3   Title  Lateral pinch and 3 tip pinch to increase by 2 lbs each Lt hand    Baseline  Lat = 10, 3 tip = 8  Time  8    Period  Weeks    Status  New      OT LONG TERM GOAL #4   Title  Pt to report less drops Lt hand  during functional tasks    Baseline  50% of the time    Time  8    Period  Weeks    Status  New            Plan - 11/23/18 1347    Clinical Impression Statement  Pt is progressing towards goals, She demonstrates understanding of inital HEP    Occupational Profile and client history currently impacting functional performance  PMH: Rt CTR 11/24/17, Fibromyalgia    Occupational performance deficits (Please refer to evaluation for details):  IADL's;ADL's;Work    Rehab Potential  Good    OT Frequency  1x / week    OT Duration  --   3 weeks followed by 2x week x 4 weeks   OT Treatment/Interventions  Self-care/ADL training;Electrical Stimulation;Therapeutic exercise;Moist Heat;Paraffin;Splinting;Patient/family education;Fluidtherapy;Scar mobilization;Therapeutic activities;Cryotherapy;Ultrasound;DME and/or AE instruction;Manual Therapy;Passive range of motion    Plan  pulsed [place], review tendon gliding ex's, putty HEP    Consulted and Agree with Plan of Care  Patient       Patient will benefit from skilled therapeutic intervention in order to improve the following deficits and impairments:  Increased edema, Decreased skin integrity, Pain, Decreased scar mobility, Impaired sensation, Decreased strength, Impaired UE functional use  Visit Diagnosis: Pain in left hand  Pain in left wrist  Muscle weakness (generalized)    Problem List Patient Active Problem List   Diagnosis Date Noted  . Menorrhagia with irregular cycle 07/25/2014  . Iron deficiency anemia due to chronic blood loss 07/25/2014  . Follow up 05/23/2014  . Fibroid, uterine 04/04/2014  . Abnormal uterine bleeding (AUB) 03/26/2014  . Diabetic peripheral neuropathy ([place]) 12/21/2013  . Low back pain 12/11/2013  . Pain in joint, shoulder region 12/11/2013  . Pain in limb 12/11/2013  . 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 Diabetes ([place]) 07/28/2012  . Hypertension 07/28/2012  . Hypercholesteremia  07/28/2012  . Atypical lobular hyperplasia of breast 07/13/2012    , 11/23/2018, 1:51 PM  [facility] [address] [place], [place], [address] Phone: [phone]   Fax:  [phone]  Name: Alicia Coffey MRN: [id number] Date of Birth: [birth year]

## 2018-11-28 ENCOUNTER — Ambulatory Visit: Payer: Medicaid Other | Admitting: Occupational Therapy

## 2018-11-28 DIAGNOSIS — M25532 Pain in left wrist: Secondary | ICD-10-CM

## 2018-11-28 DIAGNOSIS — M6281 Muscle weakness (generalized): Secondary | ICD-10-CM

## 2018-11-28 DIAGNOSIS — M79642 Pain in left hand: Secondary | ICD-10-CM | POA: Diagnosis not present

## 2018-11-28 NOTE — Therapy (Signed)
[facility] [address] [place], [place], [address] Phone: [phone]   Fax:  [phone]  Occupational Therapy Treatment  Patient Details  Name: Alicia Coffey MRN: [id number] Date of Birth: 27-Jun-1971 Referring Provider (OT): Dr. Christella Noa   Encounter Date: 11/28/2018  OT End of Session - 11/28/18 0923    Visit Number  3    Number of Visits  12    Date for OT Re-Evaluation  01/10/19    Authorization Type  MCD    Authorization Time Period  approved 3 visits, 2/12 - 12/13/18    Authorization - Visit Number  2    Authorization - Number of Visits  3    OT Start Time  0848    OT Stop Time  0930    OT Time Calculation (min)  42 min    Activity Tolerance  Patient tolerated treatment well    Behavior During Therapy  [facility] for tasks assessed/performed       Past Medical History:  Diagnosis Date  . Anemia   . Anxiety   . Arthritis    right knee  . Cancer ([place])    calcifications in R breast - biopsy, took tamoxifen - completed 2018  . Chronic kidney disease   . Depression   . Diabetes mellitus     Type 2  . Disc degeneration, lumbar   . Fibromyalgia   . Hyperlipidemia   . Hypertension    under control, has been on med. > 1 yr.  [place] body movements    while awake and asleep  . 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 Proteinuria    KIdney clinic in [place] following pt.   . Shortness of breath dyspnea    with exertion  . Vertigo 2015  . Wears dentures    upper  . Wears partial dentures    lower    Past Surgical History:  Procedure Laterality Date  . ABLATION     uterine   . BREAST BIOPSY Left 2014   benign  . BREAST BIOPSY Right 2013   high risk  . BREAST LUMPECTOMY WITH NEEDLE LOCALIZATION  08/15/2012   Procedure: BREAST LUMPECTOMY WITH NEEDLE LOCALIZATION;  Surgeon: Haywood Lasso, MD;  Location: [facility];   Service: General;  Laterality: Right;  Wire localizations Right breast calcifications  . BREAST SURGERY    . CARPAL TUNNEL RELEASE Right 11/24/2017   Procedure: RIGHT CARPAL TUNNEL RELEASE;  Surgeon: Ashok Pall, MD;  Location: [facility];  Service: Neurosurgery;  Laterality: Right;  Right CARPAL TUNNEL RELEASE  . CARPAL TUNNEL RELEASE Left 08/31/2018   Procedure: LEFT CARPAL TUNNEL RELEASE;  Surgeon: Ashok Pall, MD;  Location: [facility];  Service: Neurosurgery;  Laterality: Left;  . DILITATION & CURRETTAGE/HYSTROSCOPY WITH THERMACHOICE ABLATION N/A 08/21/2014   Procedure: DILATATION & CURETTAGE/HYSTEROSCOPY WITH THERMACHOICE ABLATION;  Surgeon: Jonnie Kind, MD;  Location: [facility];  Service: Gynecology;  Laterality: N/A;  . KNEE ARTHROSCOPY Left 10/16/2015   Procedure: ARTHROSCOPY KNEE with debridment;  Surgeon: Frederik Pear, MD;  Location: [facility];  Service: Orthopedics;  Laterality: Left;  . KNEE ARTHROSCOPY WITH MEDIAL MENISECTOMY Right 07/17/2015   Procedure: KNEE ARTHROSCOPY WITH MEDIAL MENISECTOMY;  Surgeon: Frederik Pear, MD;  Location: [facility];  Service: Orthopedics;  Laterality: Right;  . POLYPECTOMY N/A 08/21/2014   Procedure: ENDOMETRIAL POLYPECTOMY;  Surgeon: Mallory Shirk  V, MD;  Location: [facility];  Service: Gynecology;  Laterality: N/A;  . REFRACTIVE SURGERY Right    micro aneuysms  . ULNAR NERVE TRANSPOSITION Right 11/24/2017   Procedure: RIGHT ULNAR NERVE DECOMPRESSION/TRANSPOSITION;  Surgeon: Ashok Pall, MD;  Location: [facility];  Service: Neurosurgery;  Laterality: Right;  Right ULNAR NERVE DECOMPRESSION/TRANSPOSITION    There were no vitals filed for this visit.  Subjective Assessment - 11/28/18 0857    Subjective   I don't have pain right now, but it was 9/10 for a short period yesterday    Pertinent History  Lt CTR 08/31/18. PMH: RT CTR and Rt ulnar n. decompression 11/24/17, fibromyalgia, bilateral knee arthoscopy    Limitations  no lifting > 10  lbs    Patient Stated Goals  decrease pain and grip strength to hold things better    Currently in Pain?  No/denies                   OT Treatments/Exercises (OP) - 11/28/18 0001      Exercises   Exercises  Wrist;Hand      Wrist Exercises   Other wrist exercises  wrist flexion, extension, and RD x 10 reps each w/ 1 lb. weight      Hand Exercises   Other Hand Exercises  Reviewed tendon gliding ex's and performed x 10 reps each    Other Hand Exercises  Reviewed putty HEP and performed 10-20 reps (as indicated)       Modalities   Modalities  Fluidotherapy      LUE Fluidotherapy   Number Minutes Fluidotherapy  12 Minutes    LUE Fluidotherapy Location  Hand;Wrist    Comments  at beginning of session to decr. pain      Manual Therapy   Manual therapy comments  scar massage x 5 min. over incision area               OT Short Term Goals - 11/28/18 0931      OT SHORT TERM GOAL #1   Title  Independent with initial HEP     Baseline  not issued     Time  4    Period  Weeks    Status  Achieved      OT SHORT TERM GOAL #2   Title  Independent with scar massage    Baseline  pt shown how to perform, will need review    Time  4    Period  Weeks    Status  Achieved      OT SHORT TERM GOAL #3   Title  Pain consistently less than or equal to 5/10 during functional light activities and BADLS    Baseline  up to 10/10    Time  4    Period  Weeks    Status  On-going        OT Long Term Goals - 11/14/18 1102      OT LONG TERM GOAL #1   Title  Independent w/ updated HEP    Baseline  Dependent    Time  8    Period  Weeks    Status  New      OT LONG TERM GOAL #2   Title  Grip strength Lt hand to be 62 lbs or greater for gripping, lifting, opening tight jars/containers    Baseline  Lt = 55 lbs    Time  8    Period  Weeks    Status  New      OT LONG TERM GOAL #3   Title  Lateral pinch and 3 tip pinch to increase by 2 lbs each Lt hand    Baseline  Lat =  10, 3 tip = 8     Time  8    Period  Weeks    Status  New      OT LONG TERM GOAL #4   Title  Pt to report less drops Lt hand during functional tasks    Baseline  50% of the time    Time  8    Period  Weeks    Status  New            Plan - 11/28/18 0932    Clinical Impression Statement  Pt has met STG's #1 and #2. Pt progressing with strength    Occupational Profile and client history currently impacting functional performance  PMH: Rt CTR 11/24/17, Fibromyalgia    Occupational performance deficits (Please refer to evaluation for details):  IADL's;ADL's;Work    Rehab Potential  Good    OT Frequency  1x / week    OT Duration  --   for 3 weeks, followed by 2x/wk for 4 weeks   OT Treatment/Interventions  Self-care/ADL training;Electrical Stimulation;Therapeutic exercise;Moist Heat;Paraffin;Splinting;Patient/family education;Fluidtherapy;Scar mobilization;Therapeutic activities;Cryotherapy;Ultrasound;DME and/or AE instruction;Manual Therapy;Passive range of motion    Plan  pulsed [place], strengthening, review goals and resubmit to MCD    Consulted and Agree with Plan of Care  Patient       Patient will benefit from skilled therapeutic intervention in order to improve the following deficits and impairments:  Increased edema, Decreased skin integrity, Pain, Decreased scar mobility, Impaired sensation, Decreased strength, Impaired UE functional use  Visit Diagnosis: Pain in left hand  Pain in left wrist  Muscle weakness (generalized)    Problem List Patient Active Problem List   Diagnosis Date Noted  . Menorrhagia with irregular cycle 07/25/2014  . Iron deficiency anemia due to chronic blood loss 07/25/2014  . Follow up 05/23/2014  . Fibroid, uterine 04/04/2014  . Abnormal uterine bleeding (AUB) 03/26/2014  . Diabetic peripheral neuropathy ([place]) 12/21/2013  . Low back pain 12/11/2013  . Pain in joint, shoulder region 12/11/2013  . Pain in limb 12/11/2013  . 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 Diabetes ([place]) 07/28/2012  . Hypertension 07/28/2012  . Hypercholesteremia 07/28/2012  . Atypical lobular hyperplasia of breast 07/13/2012    Carey Bullocks, OTR/L 11/28/2018, 9:43 AM  [facility] [address] [place], [place], [address] Phone: [phone]   Fax:  [phone]  Name: Alicia Coffey MRN: [id number] Date of Birth: 18-Aug-1971

## 2018-12-05 ENCOUNTER — Ambulatory Visit: Payer: Medicaid Other | Admitting: Occupational Therapy

## 2018-12-05 DIAGNOSIS — M79642 Pain in left hand: Secondary | ICD-10-CM | POA: Diagnosis not present

## 2018-12-05 DIAGNOSIS — M25532 Pain in left wrist: Secondary | ICD-10-CM

## 2018-12-05 DIAGNOSIS — M6281 Muscle weakness (generalized): Secondary | ICD-10-CM

## 2018-12-05 NOTE — Therapy (Signed)
[facility] [address] [place], [place], [address] Phone: [phone]   Fax:  [phone]  Occupational Therapy Treatment  Patient Details  Name: Alicia Coffey MRN: [id number] Date of Birth: 03-Jul-1971 Referring Provider (OT): Dr. Christella Noa   Encounter Date: 12/05/2018  OT End of Session - 12/05/18 0937    Visit Number  4    Number of Visits  12    Date for OT Re-Evaluation  01/10/19    Authorization Type  MCD    Authorization Time Period  approved 3 visits, 2/12 - 12/13/18    Authorization - Visit Number  3    Authorization - Number of Visits  3    OT Start Time  3559    OT Stop Time  0930    OT Time Calculation (min)  35 min    Activity Tolerance  Patient tolerated treatment well    Behavior During Therapy  [facility] for tasks assessed/performed       Past Medical History:  Diagnosis Date  . Anemia   . Anxiety   . Arthritis    right knee  . Cancer ([place])    calcifications in R breast - biopsy, took tamoxifen - completed 2018  . Chronic kidney disease   . Depression   . Diabetes mellitus     Type 2  . Disc degeneration, lumbar   . Fibromyalgia   . Hyperlipidemia   . Hypertension    under control, has been on med. > 1 yr.  [place] body movements    while awake and asleep  . 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 Proteinuria    KIdney clinic in [place] following pt.   . Shortness of breath dyspnea    with exertion  . Vertigo 2015  . Wears dentures    upper  . Wears partial dentures    lower    Past Surgical History:  Procedure Laterality Date  . ABLATION     uterine   . BREAST BIOPSY Left 2014   benign  . BREAST BIOPSY Right 2013   high risk  . BREAST LUMPECTOMY WITH NEEDLE LOCALIZATION  08/15/2012   Procedure: BREAST LUMPECTOMY WITH NEEDLE LOCALIZATION;  Surgeon: Haywood Lasso, MD;  Location: [facility];   Service: General;  Laterality: Right;  Wire localizations Right breast calcifications  . BREAST SURGERY    . CARPAL TUNNEL RELEASE Right 11/24/2017   Procedure: RIGHT CARPAL TUNNEL RELEASE;  Surgeon: Ashok Pall, MD;  Location: [facility];  Service: Neurosurgery;  Laterality: Right;  Right CARPAL TUNNEL RELEASE  . CARPAL TUNNEL RELEASE Left 08/31/2018   Procedure: LEFT CARPAL TUNNEL RELEASE;  Surgeon: Ashok Pall, MD;  Location: [facility];  Service: Neurosurgery;  Laterality: Left;  . DILITATION & CURRETTAGE/HYSTROSCOPY WITH THERMACHOICE ABLATION N/A 08/21/2014   Procedure: DILATATION & CURETTAGE/HYSTEROSCOPY WITH THERMACHOICE ABLATION;  Surgeon: Jonnie Kind, MD;  Location: [facility];  Service: Gynecology;  Laterality: N/A;  . KNEE ARTHROSCOPY Left 10/16/2015   Procedure: ARTHROSCOPY KNEE with debridment;  Surgeon: Frederik Pear, MD;  Location: [facility];  Service: Orthopedics;  Laterality: Left;  . KNEE ARTHROSCOPY WITH MEDIAL MENISECTOMY Right 07/17/2015   Procedure: KNEE ARTHROSCOPY WITH MEDIAL MENISECTOMY;  Surgeon: Frederik Pear, MD;  Location: [facility];  Service: Orthopedics;  Laterality: Right;  . POLYPECTOMY N/A 08/21/2014   Procedure: ENDOMETRIAL POLYPECTOMY;  Surgeon: Mallory Shirk  V, MD;  Location: [facility];  Service: Gynecology;  Laterality: N/A;  . REFRACTIVE SURGERY Right    micro aneuysms  . ULNAR NERVE TRANSPOSITION Right 11/24/2017   Procedure: RIGHT ULNAR NERVE DECOMPRESSION/TRANSPOSITION;  Surgeon: Ashok Pall, MD;  Location: [facility];  Service: Neurosurgery;  Laterality: Right;  Right ULNAR NERVE DECOMPRESSION/TRANSPOSITION    There were no vitals filed for this visit.  Subjective Assessment - 12/05/18 0913    Subjective   My pain can still get high but it does recover quicker    Pertinent History  Lt CTR 08/31/18. PMH: RT CTR and Rt ulnar n. decompression 11/24/17, fibromyalgia, bilateral knee arthoscopy    Limitations  no lifting > 10 lbs    Patient  Stated Goals  decrease pain and grip strength to hold things better    Currently in Pain?  Yes    Pain Score  4     Pain Location  Hand    Pain Orientation  Left    Pain Descriptors / Indicators  Aching    Pain Type  Acute pain;Neuropathic pain    Pain Onset  More than a month ago    Pain Frequency  Intermittent    Aggravating Factors   cold weather    Pain Relieving Factors  heat         [facility] OT Assessment - 12/05/18 0001      Hand Function   Left Hand Grip (lbs)  63 lbs               OT Treatments/Exercises (OP) - 12/05/18 0001      Wrist Exercises   Other wrist exercises  wrist flexion, extension, and RD x 10 reps each w/ 1 lb. weight X 10 reps each      Hand Exercises   Other Hand Exercises  Updated putty resistance to red and performed each putty ex x 15 reps      Modalities   Modalities  Ultrasound      Ultrasound   Ultrasound Location  at incision area of carpal tunnel    Ultrasound Parameters  0.8 wts/cm2, 20% pulsed, 3 Mhz, x 8 min    Ultrasound Goals  Pain               OT Short Term Goals - 12/05/18 2725      OT SHORT TERM GOAL #1   Title  Independent with initial HEP     Baseline  not issued     Time  4    Period  Weeks    Status  Achieved      OT SHORT TERM GOAL #2   Title  Independent with scar massage    Baseline  pt shown how to perform, will need review    Time  4    Period  Weeks    Status  Achieved      OT SHORT TERM GOAL #3   Title  Pain consistently less than or equal to 5/10 during functional light activities and BADLS    Baseline  up to 10/10    Time  4    Period  Weeks    Status  On-going   Pt still reports pain occasionally up to 10/10 but recovers quicker from pain       OT Long Term Goals - 12/05/18 3664      OT LONG TERM GOAL #1   Title  Independent w/ updated HEP    Baseline  Dependent  Time  8    Period  Weeks    Status  Achieved      OT LONG TERM GOAL #2   Title  Grip strength Lt hand to be  65 lbs or greater for gripping, lifting, opening tight jars/containers    Baseline  Lt = 55 lbs    Time  8    Period  Weeks    Status  Revised   12/05/18 = 63 lbs     OT LONG TERM GOAL #3   Title  Lateral pinch and 3 tip pinch to increase by 2 lbs each Lt hand    Baseline  Lat = 10, 3 tip = 8     Time  8    Period  Weeks    Status  New      OT LONG TERM GOAL #4   Title  Pt to report less drops Lt hand during functional tasks    Baseline  50% of the time    Time  8    Period  Weeks    Status  New            Plan - 12/05/18 7366    Clinical Impression Statement  Pt has met 2/3 STG's and 1 LTG at this time. Pt has also improved in Lt grip strength. Pt continues to have pain but recovers quicker from pain now.     Occupational Profile and client history currently impacting functional performance  PMH: Rt CTR 11/24/17, Fibromyalgia    Occupational performance deficits (Please refer to evaluation for details):  IADL's;ADL's;Work    Rehab Potential  Good    OT Frequency  2x / week    OT Duration  4 weeks    OT Treatment/Interventions  Self-care/ADL training;Electrical Stimulation;Therapeutic exercise;Moist Heat;Paraffin;Splinting;Patient/family education;Fluidtherapy;Scar mobilization;Therapeutic activities;Cryotherapy;Ultrasound;DME and/or AE instruction;Manual Therapy;Passive range of motion    Plan  requesting more visits from [facility], continue pulsed [place], fluidotherapy, strengthening    Consulted and Agree with Plan of Care  Patient       Patient will benefit from skilled therapeutic intervention in order to improve the following deficits and impairments:  Increased edema, Decreased skin integrity, Pain, Decreased scar mobility, Impaired sensation, Decreased strength, Impaired UE functional use  Visit Diagnosis: Pain in left hand  Pain in left wrist  Muscle weakness (generalized)    Problem List Patient Active Problem List   Diagnosis Date Noted  . Menorrhagia with  irregular cycle 07/25/2014  . Iron deficiency anemia due to chronic blood loss 07/25/2014  . Follow up 05/23/2014  . Fibroid, uterine 04/04/2014  . Abnormal uterine bleeding (AUB) 03/26/2014  . Diabetic peripheral neuropathy ([place]) 12/21/2013  . Low back pain 12/11/2013  . Pain in joint, shoulder region 12/11/2013  . Pain in limb 12/11/2013  . 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 Diabetes ([place]) 07/28/2012  . Hypertension 07/28/2012  . Hypercholesteremia 07/28/2012  . Atypical lobular hyperplasia of breast 07/13/2012    Carey Bullocks, OTR/L 12/05/2018, 9:42 AM  [facility] [address] [place], [place], [address] Phone: [phone]   Fax:  [phone]  Name: Alicia Coffey MRN: [id number] Date of Birth: [birth year]

## 2018-12-14 ENCOUNTER — Ambulatory Visit: Payer: Medicaid Other | Attending: Neurosurgery | Admitting: Occupational Therapy

## 2018-12-14 DIAGNOSIS — M25532 Pain in left wrist: Secondary | ICD-10-CM | POA: Insufficient documentation

## 2018-12-14 DIAGNOSIS — M79642 Pain in left hand: Secondary | ICD-10-CM | POA: Insufficient documentation

## 2018-12-14 DIAGNOSIS — M6281 Muscle weakness (generalized): Secondary | ICD-10-CM | POA: Diagnosis present

## 2018-12-14 NOTE — Therapy (Signed)
[facility] [address] [place], [place], [address] Phone: [phone]   Fax:  [phone]  Occupational Therapy Treatment  Patient Details  Name: Alicia Coffey MRN: [id number] Date of Birth: [birth year] Referring Provider (OT): Dr. Christella Noa   Encounter Date: 12/14/2018  OT End of Session - 12/14/18 0929    Visit Number  5    Number of Visits  12    Date for OT Re-Evaluation  01/10/19    Authorization Type  MCD    Authorization Time Period  approved 3 visits, 2/12 - 12/13/18, approved 8 more 3/4 - 01/10/19    Authorization - Visit Number  1    Authorization - Number of Visits  8    OT Start Time  0845    OT Stop Time  0930    OT Time Calculation (min)  45 min    Activity Tolerance  Patient tolerated treatment well    Behavior During Therapy  [facility] for tasks assessed/performed       Past Medical History:  Diagnosis Date  . Anemia   . Anxiety   . Arthritis    right knee  . Cancer ([place])    calcifications in R breast - biopsy, took tamoxifen - completed 2018  . Chronic kidney disease   . Depression   . Diabetes mellitus     Type 2  . Disc degeneration, lumbar   . Fibromyalgia   . Hyperlipidemia   . Hypertension    under control, has been on med. > 1 yr.  H[place] Dyer body movements    while awake and asleep  . 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 Proteinuria    KIdney clinic in [place] following pt.   . Shortness of breath dyspnea    with exertion  . Vertigo 2015  . Wears dentures    upper  . Wears partial dentures    lower    Past Surgical History:  Procedure Laterality Date  . ABLATION     uterine   . BREAST BIOPSY Left 2014   benign  . BREAST BIOPSY Right 2013   high risk  . BREAST LUMPECTOMY WITH NEEDLE LOCALIZATION  08/15/2012   Procedure: BREAST LUMPECTOMY WITH NEEDLE LOCALIZATION;  Surgeon: Haywood Lasso, MD;  Location:  [facility];  Service: General;  Laterality: Right;  Wire localizations Right breast calcifications  . BREAST SURGERY    . CARPAL TUNNEL RELEASE Right 11/24/2017   Procedure: RIGHT CARPAL TUNNEL RELEASE;  Surgeon: Ashok Pall, MD;  Location: [facility];  Service: Neurosurgery;  Laterality: Right;  Right CARPAL TUNNEL RELEASE  . CARPAL TUNNEL RELEASE Left 08/31/2018   Procedure: LEFT CARPAL TUNNEL RELEASE;  Surgeon: Ashok Pall, MD;  Location: [facility];  Service: Neurosurgery;  Laterality: Left;  . DILITATION & CURRETTAGE/HYSTROSCOPY WITH THERMACHOICE ABLATION N/A 08/21/2014   Procedure: DILATATION & CURETTAGE/HYSTEROSCOPY WITH THERMACHOICE ABLATION;  Surgeon: Jonnie Kind, MD;  Location: [facility];  Service: Gynecology;  Laterality: N/A;  . KNEE ARTHROSCOPY Left 10/16/2015   Procedure: ARTHROSCOPY KNEE with debridment;  Surgeon: Frederik Pear, MD;  Location: [facility];  Service: Orthopedics;  Laterality: Left;  . KNEE ARTHROSCOPY WITH MEDIAL MENISECTOMY Right 07/17/2015   Procedure: KNEE ARTHROSCOPY WITH MEDIAL MENISECTOMY;  Surgeon: Frederik Pear, MD;  Location: [facility];  Service: Orthopedics;  Laterality: Right;  . POLYPECTOMY N/A 08/21/2014   Procedure:  ENDOMETRIAL POLYPECTOMY;  Surgeon: Jonnie Kind, MD;  Location: [facility];  Service: Gynecology;  Laterality: N/A;  . REFRACTIVE SURGERY Right    micro aneuysms  . ULNAR NERVE TRANSPOSITION Right 11/24/2017   Procedure: RIGHT ULNAR NERVE DECOMPRESSION/TRANSPOSITION;  Surgeon: Ashok Pall, MD;  Location: [facility];  Service: Neurosurgery;  Laterality: Right;  Right ULNAR NERVE DECOMPRESSION/TRANSPOSITION    There were no vitals filed for this visit.  Subjective Assessment - 12/14/18 0851    Subjective   My pain has been much better the last several days    Pertinent History  Lt CTR 08/31/18. PMH: RT CTR and Rt ulnar n. decompression 11/24/17, fibromyalgia, bilateral knee arthoscopy    Limitations  no lifting  > 10 lbs    Patient Stated Goals  decrease pain and grip strength to hold things better    Currently in Pain?  Yes    Pain Score  2     Pain Orientation  Left    Pain Descriptors / Indicators  Aching    Pain Type  Acute pain;Neuropathic pain    Pain Onset  More than a month ago    Pain Frequency  Intermittent    Aggravating Factors   cold weather     Pain Relieving Factors  heat                   OT Treatments/Exercises (OP) - 12/14/18 0001      Wrist Exercises   Other wrist exercises  wrist flexion, extension, and RD x 10 reps each w/ 1 lb. weight X 10 reps each, then x 10 reps each w/ 2 lb. weight      Hand Exercises   Other Hand Exercises  Performed putty HEP w/ red putty: grip x 20 reps, pinch x 10 reps      Ultrasound   Ultrasound Location  at incision area of carpal tunnel    Ultrasound Parameters  0.8 wts/cm2, 20% pulsed, 3 Mhz x 8 min    Ultrasound Goals  Pain   scar management     LUE Fluidotherapy   Number Minutes Fluidotherapy  8 Minutes    LUE Fluidotherapy Location  Hand;Wrist    Comments  to decrease stiffness and for desensitization               OT Short Term Goals - 12/05/18 1610      OT SHORT TERM GOAL #1   Title  Independent with initial HEP     Baseline  not issued     Time  4    Period  Weeks    Status  Achieved      OT SHORT TERM GOAL #2   Title  Independent with scar massage    Baseline  pt shown how to perform, will need review    Time  4    Period  Weeks    Status  Achieved      OT SHORT TERM GOAL #3   Title  Pain consistently less than or equal to 5/10 during functional light activities and BADLS    Baseline  up to 10/10    Time  4    Period  Weeks    Status  On-going   Pt still reports pain occasionally up to 10/10 but recovers quicker from pain       OT Long Term Goals - 12/05/18 9604      OT LONG TERM GOAL #1   Title  Independent w/  updated HEP    Baseline  Dependent    Time  8    Period  Weeks     Status  Achieved      OT LONG TERM GOAL #2   Title  Grip strength Lt hand to be 65 lbs or greater for gripping, lifting, opening tight jars/containers    Baseline  Lt = 55 lbs    Time  8    Period  Weeks    Status  Revised   12/05/18 = 63 lbs     OT LONG TERM GOAL #3   Title  Lateral pinch and 3 tip pinch to increase by 2 lbs each Lt hand    Baseline  Lat = 10, 3 tip = 8     Time  8    Period  Weeks    Status  New      OT LONG TERM GOAL #4   Title  Pt to report less drops Lt hand during functional tasks    Baseline  50% of the time    Time  8    Period  Weeks    Status  New            Plan - 12/14/18 0931    Clinical Impression Statement  Pt has improved significantly with pain Lt hand. Pt tolerating increased strengthening well today    Occupational Profile and client history currently impacting functional performance  PMH: Rt CTR 11/24/17, Fibromyalgia    Occupational performance deficits (Please refer to evaluation for details):  IADL's;ADL's;Work    Rehab Potential  Good    OT Frequency  2x / week    OT Duration  4 weeks    OT Treatment/Interventions  Self-care/ADL training;Electrical Stimulation;Therapeutic exercise;Moist Heat;Paraffin;Splinting;Patient/family education;Fluidtherapy;Scar mobilization;Therapeutic activities;Cryotherapy;Ultrasound;DME and/or AE instruction;Manual Therapy;Passive range of motion    Plan  continue [place], fluido, gripper w/ breaks prn    Consulted and Agree with Plan of Care  Patient       Patient will benefit from skilled therapeutic intervention in order to improve the following deficits and impairments:     Visit Diagnosis: Pain in left hand  Pain in left wrist  Muscle weakness (generalized)    Problem List Patient Active Problem List   Diagnosis Date Noted  . Menorrhagia with irregular cycle 07/25/2014  . Iron deficiency anemia due to chronic blood loss 07/25/2014  . Follow up 05/23/2014  . Fibroid, uterine 04/04/2014  .  Abnormal uterine bleeding (AUB) 03/26/2014  . Diabetic peripheral neuropathy ([place]) 12/21/2013  . Low back pain 12/11/2013  . Pain in joint, shoulder region 12/11/2013  . Pain in limb 12/11/2013  . 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 Diabetes ([place]) 07/28/2012  . Hypertension 07/28/2012  . Hypercholesteremia 07/28/2012  . Atypical lobular hyperplasia of breast 07/13/2012    Carey Bullocks, OTR/L 12/14/2018, 9:32 AM  [facility] [address] [place], [place], [address] Phone: [phone]   Fax:  [phone]  Name: Alicia Coffey MRN: [id number] Date of Birth: [birth year]

## 2018-12-15 ENCOUNTER — Ambulatory Visit: Payer: Medicaid Other | Admitting: Occupational Therapy

## 2018-12-15 DIAGNOSIS — M79642 Pain in left hand: Secondary | ICD-10-CM

## 2018-12-15 DIAGNOSIS — M6281 Muscle weakness (generalized): Secondary | ICD-10-CM

## 2018-12-15 NOTE — Therapy (Signed)
[facility] [address] [place], [place], [address] Phone: [phone]   Fax:  [phone]  Occupational Therapy Treatment  Patient Details  Name: Alicia Coffey MRN: [id number] Date of Birth: Oct 25, 1970 Referring Provider (OT): Dr. Christella Noa   Encounter Date: 12/15/2018  OT End of Session - 12/15/18 0936    Visit Number  6    Number of Visits  12    Date for OT Re-Evaluation  01/10/19    Authorization Type  MCD    Authorization Time Period  approved 3 visits, 2/12 - 12/13/18, approved 8 more 3/4 - 01/10/19    Authorization - Visit Number  2    Authorization - Number of Visits  8    OT Start Time  0848    OT Stop Time  0930    OT Time Calculation (min)  42 min    Activity Tolerance  Patient tolerated treatment well    Behavior During Therapy  [facility] for tasks assessed/performed       Past Medical History:  Diagnosis Date  . Anemia   . Anxiety   . Arthritis    right knee  . Cancer ([place])    calcifications in R breast - biopsy, took tamoxifen - completed 2018  . Chronic kidney disease   . Depression   . Diabetes mellitus     Type 2  . Disc degeneration, lumbar   . Fibromyalgia   . Hyperlipidemia   . Hypertension    under control, has been on med. > 1 yr.  [place] Dyer body movements    while awake and asleep  . 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 Proteinuria    KIdney clinic in [place] following pt.   . Shortness of breath dyspnea    with exertion  . Vertigo 2015  . Wears dentures    upper  . Wears partial dentures    lower    Past Surgical History:  Procedure Laterality Date  . ABLATION     uterine   . BREAST BIOPSY Left 2014   benign  . BREAST BIOPSY Right 2013   high risk  . BREAST LUMPECTOMY WITH NEEDLE LOCALIZATION  08/15/2012   Procedure: BREAST LUMPECTOMY WITH NEEDLE LOCALIZATION;  Surgeon: Haywood Lasso, MD;  Location:  [facility];  Service: General;  Laterality: Right;  Wire localizations Right breast calcifications  . BREAST SURGERY    . CARPAL TUNNEL RELEASE Right 11/24/2017   Procedure: RIGHT CARPAL TUNNEL RELEASE;  Surgeon: Ashok Pall, MD;  Location: [facility];  Service: Neurosurgery;  Laterality: Right;  Right CARPAL TUNNEL RELEASE  . CARPAL TUNNEL RELEASE Left 08/31/2018   Procedure: LEFT CARPAL TUNNEL RELEASE;  Surgeon: Ashok Pall, MD;  Location: [facility];  Service: Neurosurgery;  Laterality: Left;  . DILITATION & CURRETTAGE/HYSTROSCOPY WITH THERMACHOICE ABLATION N/A 08/21/2014   Procedure: DILATATION & CURETTAGE/HYSTEROSCOPY WITH THERMACHOICE ABLATION;  Surgeon: Jonnie Kind, MD;  Location: [facility];  Service: Gynecology;  Laterality: N/A;  . KNEE ARTHROSCOPY Left 10/16/2015   Procedure: ARTHROSCOPY KNEE with debridment;  Surgeon: Frederik Pear, MD;  Location: [facility];  Service: Orthopedics;  Laterality: Left;  . KNEE ARTHROSCOPY WITH MEDIAL MENISECTOMY Right 07/17/2015   Procedure: KNEE ARTHROSCOPY WITH MEDIAL MENISECTOMY;  Surgeon: Frederik Pear, MD;  Location: [facility];  Service: Orthopedics;  Laterality: Right;  . POLYPECTOMY N/A 08/21/2014   Procedure:  ENDOMETRIAL POLYPECTOMY;  Surgeon: Jonnie Kind, MD;  Location: [facility];  Service: Gynecology;  Laterality: N/A;  . REFRACTIVE SURGERY Right    micro aneuysms  . ULNAR NERVE TRANSPOSITION Right 11/24/2017   Procedure: RIGHT ULNAR NERVE DECOMPRESSION/TRANSPOSITION;  Surgeon: Ashok Pall, MD;  Location: [facility];  Service: Neurosurgery;  Laterality: Right;  Right ULNAR NERVE DECOMPRESSION/TRANSPOSITION    There were no vitals filed for this visit.  Subjective Assessment - 12/15/18 0851    Pertinent History  Lt CTR 08/31/18. PMH: RT CTR and Rt ulnar n. decompression 11/24/17, fibromyalgia, bilateral knee arthoscopy    Limitations  no lifting > 10 lbs    Patient Stated Goals  decrease pain and grip strength  to hold things better    Currently in Pain?  Yes    Pain Score  2     Pain Location  Hand    Pain Orientation  Left    Pain Descriptors / Indicators  Aching    Pain Type  Acute pain;Neuropathic pain    Pain Onset  More than a month ago    Pain Frequency  Intermittent    Aggravating Factors   cold weather    Pain Relieving Factors  heat                   OT Treatments/Exercises (OP) - 12/15/18 0001      Hand Exercises   Other Hand Exercises  Gripper set at level 2 resistance to pick up blocks Lt hand for sustained grip strength 2 rest breaks needed. Pt shown stretch to wrist/thumb for carpal tunnel      Ultrasound   Ultrasound Location  at incision area of carpal tunnel    Ultrasound Parameters  0.8 wts/cm2, 20% pulsed, 3 Mhz x 8 min    Ultrasound Goals  Pain   and scar management     LUE Fluidotherapy   Number Minutes Fluidotherapy  8 Minutes    LUE Fluidotherapy Location  Hand;Wrist    Comments  to decrease stiffness/pain      Manual Therapy   Manual therapy comments  scar massage x 5 min. over incision area               OT Short Term Goals - 12/05/18 0937      OT SHORT TERM GOAL #1   Title  Independent with initial HEP     Baseline  not issued     Time  4    Period  Weeks    Status  Achieved      OT SHORT TERM GOAL #2   Title  Independent with scar massage    Baseline  pt shown how to perform, will need review    Time  4    Period  Weeks    Status  Achieved      OT SHORT TERM GOAL #3   Title  Pain consistently less than or equal to 5/10 during functional light activities and BADLS    Baseline  up to 10/10    Time  4    Period  Weeks    Status  On-going   Pt still reports pain occasionally up to 10/10 but recovers quicker from pain       OT Long Term Goals - 12/05/18 2248      OT LONG TERM GOAL #1   Title  Independent w/ updated HEP    Baseline  Dependent    Time  8  Period  Weeks    Status  Achieved      OT LONG TERM GOAL  #2   Title  Grip strength Lt hand to be 65 lbs or greater for gripping, lifting, opening tight jars/containers    Baseline  Lt = 55 lbs    Time  8    Period  Weeks    Status  Revised   12/05/18 = 63 lbs     OT LONG TERM GOAL #3   Title  Lateral pinch and 3 tip pinch to increase by 2 lbs each Lt hand    Baseline  Lat = 10, 3 tip = 8     Time  8    Period  Weeks    Status  New      OT LONG TERM GOAL #4   Title  Pt to report less drops Lt hand during functional tasks    Baseline  50% of the time    Time  8    Period  Weeks    Status  New            Plan - 12/15/18 0936    Clinical Impression Statement  Pt improving functionally w/ Lt hand. Pt still has numbness ring and small fingers     Occupational Profile and client history currently impacting functional performance  PMH: Rt CTR 11/24/17, Fibromyalgia    Occupational performance deficits (Please refer to evaluation for details):  IADL's;ADL's;Work    Rehab Potential  Good    OT Frequency  2x / week    OT Duration  4 weeks    OT Treatment/Interventions  Self-care/ADL training;Electrical Stimulation;Therapeutic exercise;Moist Heat;Paraffin;Splinting;Patient/family education;Fluidtherapy;Scar mobilization;Therapeutic activities;Cryotherapy;Ultrasound;DME and/or AE instruction;Manual Therapy;Passive range of motion    Plan  fluido, continue wrist and hand strengthening    Consulted and Agree with Plan of Care  Patient       Patient will benefit from skilled therapeutic intervention in order to improve the following deficits and impairments:     Visit Diagnosis: Pain in left hand  Muscle weakness (generalized)    Problem List Patient Active Problem List   Diagnosis Date Noted  . Menorrhagia with irregular cycle 07/25/2014  . Iron deficiency anemia due to chronic blood loss 07/25/2014  . Follow up 05/23/2014  . Fibroid, uterine 04/04/2014  . Abnormal uterine bleeding (AUB) 03/26/2014  . Diabetic peripheral  neuropathy ([place]) 12/21/2013  . Low back pain 12/11/2013  . Pain in joint, shoulder region 12/11/2013  . Pain in limb 12/11/2013  . 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 Diabetes ([place]) 07/28/2012  . Hypertension 07/28/2012  . Hypercholesteremia 07/28/2012  . Atypical lobular hyperplasia of breast 07/13/2012    Carey Bullocks, OTR/L 12/15/2018, 9:38 AM  [facility] [address] [place], [place], [address] Phone: [phone]   Fax:  [phone]  Name: ALAYNNA KERWOOD MRN: [id number] Date of Birth: [birth year]

## 2018-12-20 ENCOUNTER — Ambulatory Visit (INDEPENDENT_AMBULATORY_CARE_PROVIDER_SITE_OTHER): Payer: Medicaid Other | Admitting: Podiatry

## 2018-12-20 DIAGNOSIS — L84 Corns and callosities: Secondary | ICD-10-CM

## 2018-12-20 DIAGNOSIS — B351 Tinea unguium: Secondary | ICD-10-CM | POA: Diagnosis not present

## 2018-12-20 DIAGNOSIS — E0843 Diabetes mellitus due to underlying condition with diabetic autonomic (poly)neuropathy: Secondary | ICD-10-CM

## 2018-12-20 DIAGNOSIS — M79609 Pain in unspecified limb: Principal | ICD-10-CM

## 2018-12-20 DIAGNOSIS — M79676 Pain in unspecified toe(s): Secondary | ICD-10-CM | POA: Diagnosis not present

## 2018-12-20 NOTE — Patient Instructions (Signed)
Diabetes Mellitus and Foot Care Foot care is an important part of your health, especially when you have diabetes. Diabetes may cause you to have problems because of poor blood flow (circulation) to your feet and legs, which can cause your skin to:  Become thinner and drier.  Break more easily.  Heal more slowly.  Peel and crack. You may also have nerve damage (neuropathy) in your legs and feet, causing decreased feeling in them. This means that you may not notice minor injuries to your feet that could lead to more serious problems. Noticing and addressing any potential problems early is the best way to prevent future foot problems. How to care for your feet Foot hygiene  Wash your feet daily with warm water and mild soap. Do not use hot water. Then, pat your feet and the areas between your toes until they are completely dry. Do not soak your feet as this can dry your skin.  Trim your toenails straight across. Do not dig under them or around the cuticle. File the edges of your nails with an emery board or nail file.  Apply a moisturizing lotion or petroleum jelly to the skin on your feet and to dry, brittle toenails. Use lotion that does not contain alcohol and is unscented. Do not apply lotion between your toes. Shoes and socks  Wear clean socks or stockings every day. Make sure they are not too tight. Do not wear knee-high stockings since they may decrease blood flow to your legs.  Wear shoes that fit properly and have enough cushioning. Always look in your shoes before you put them on to be sure there are no objects inside.  To break in new shoes, wear them for just a few hours a day. This prevents injuries on your feet. Wounds, scrapes, corns, and calluses  Check your feet daily for blisters, cuts, bruises, sores, and redness. If you cannot see the bottom of your feet, use a mirror or ask someone for help.  Do not cut corns or calluses or try to remove them with medicine.  If you  find a minor scrape, cut, or break in the skin on your feet, keep it and the skin around it clean and dry. You may clean these areas with mild soap and water. Do not clean the area with peroxide, alcohol, or iodine.  If you have a wound, scrape, corn, or callus on your foot, look at it several times a day to make sure it is healing and not infected. Check for: ? Redness, swelling, or pain. ? Fluid or blood. ? Warmth. ? Pus or a bad smell. General instructions  Do not cross your legs. This may decrease blood flow to your feet.  Do not use heating pads or hot water bottles on your feet. They may burn your skin. If you have lost feeling in your feet or legs, you may not know this is happening until it is too late.  Protect your feet from hot and cold by wearing shoes, such as at the beach or on hot pavement.  Schedule a complete foot exam at least once a year (annually) or more often if you have foot problems. If you have foot problems, report any cuts, sores, or bruises to your health care provider immediately. Contact a health care provider if:  You have a medical condition that increases your risk of infection and you have any cuts, sores, or bruises on your feet.  You have an injury that is not   healing.  You have redness on your legs or feet.  You feel burning or tingling in your legs or feet.  You have pain or cramps in your legs and feet.  Your legs or feet are numb.  Your feet always feel cold.  You have pain around a toenail. Get help right away if:  You have a wound, scrape, corn, or callus on your foot and: ? You have pain, swelling, or redness that gets worse. ? You have fluid or blood coming from the wound, scrape, corn, or callus. ? Your wound, scrape, corn, or callus feels warm to the touch. ? You have pus or a bad smell coming from the wound, scrape, corn, or callus. ? You have a fever. ? You have a red line going up your leg. Summary  Check your feet every day  for cuts, sores, red spots, swelling, and blisters.  Moisturize feet and legs daily.  Wear shoes that fit properly and have enough cushioning.  If you have foot problems, report any cuts, sores, or bruises to your health care provider immediately.  Schedule a complete foot exam at least once a year (annually) or more often if you have foot problems. This information is not intended to replace advice given to you by your health care provider. Make sure you discuss any questions you have with your health care provider. Document Released: 09/25/2000 Document Revised: 11/10/2017 Document Reviewed: 10/30/2016 Elsevier Interactive Patient Education  2019 [employer]  Onychomycosis/Fungal Toenails  WHAT IS IT? An infection that lies within the keratin of your nail plate that is caused by a fungus.  WHY ME? Fungal infections affect all ages, sexes, races, and creeds.  There may be many factors that predispose you to a fungal infection such as age, coexisting medical conditions such as diabetes, or an autoimmune disease; stress, medications, fatigue, genetics, etc.  Bottom line: fungus thrives in a warm, moist environment and your shoes offer such a location.  IS IT CONTAGIOUS? Theoretically, yes.  You do not want to share shoes, nail clippers or files with someone who has fungal toenails.  Walking around barefoot in the same room or sleeping in the same bed is unlikely to transfer the organism.  It is important to realize, however, that fungus can spread easily from one nail to the next on the same foot.  HOW DO WE TREAT THIS?  There are several ways to treat this condition.  Treatment may depend on many factors such as age, medications, pregnancy, liver and kidney conditions, etc.  It is best to ask your doctor which options are available to you.  1. No treatment.   Unlike many other medical concerns, you can live with this condition.  However for many people this can be a painful condition and  may lead to ingrown toenails or a bacterial infection.  It is recommended that you keep the nails cut short to help reduce the amount of fungal nail. 2. Topical treatment.  These range from herbal remedies to prescription strength nail lacquers.  About 40-50% effective, topicals require twice daily application for approximately 9 to 12 months or until an entirely new nail has grown out.  The most effective topicals are medical grade medications available through physicians offices. 3. Oral antifungal medications.  With an 80-90% cure rate, the most common oral medication requires 3 to 4 months of therapy and stays in your system for a year as the new nail grows out.  Oral antifungal medications do require   blood work to make sure it is a safe drug for you.  A liver function panel will be performed prior to starting the medication and after the first month of treatment.  It is important to have the blood work performed to avoid any harmful side effects.  In general, this medication safe but blood work is required. 4. Laser Therapy.  This treatment is performed by applying a specialized laser to the affected nail plate.  This therapy is noninvasive, fast, and non-painful.  It is not covered by insurance and is therefore, out of pocket.  The results have been very good with a 80-95% cure rate.  [facility] is the only practice in the area to offer this therapy. 5. Permanent Nail Avulsion.  Removing the entire nail so that a new nail will not grow back. 

## 2018-12-21 ENCOUNTER — Ambulatory Visit: Payer: Medicaid Other | Admitting: Occupational Therapy

## 2018-12-21 DIAGNOSIS — M79642 Pain in left hand: Secondary | ICD-10-CM | POA: Diagnosis not present

## 2018-12-21 DIAGNOSIS — M6281 Muscle weakness (generalized): Secondary | ICD-10-CM

## 2018-12-21 DIAGNOSIS — M25532 Pain in left wrist: Secondary | ICD-10-CM

## 2018-12-21 NOTE — Therapy (Signed)
[facility] [address] [place], [place], [address] Phone: [phone]   Fax:  [phone]  Occupational Therapy Treatment  Patient Details  Name: Alicia Coffey MRN: [id number] Date of Birth: 28-May-1971 Referring Provider (OT): Dr. Christella Noa   Encounter Date: 12/21/2018  OT End of Session - 12/21/18 0941    Visit Number  7    Number of Visits  12    Date for OT Re-Evaluation  01/10/19    Authorization Type  MCD    Authorization Time Period  approved 3 visits, 2/12 - 12/13/18, approved 8 more 3/4 - 01/10/19    Authorization - Visit Number  3    Authorization - Number of Visits  8    OT Start Time  9798    OT Stop Time  0937    OT Time Calculation (min)  43 min    Activity Tolerance  Patient tolerated treatment well    Behavior During Therapy  [facility] for tasks assessed/performed       Past Medical History:  Diagnosis Date  . Anemia   . Anxiety   . Arthritis    right knee  . Cancer ([place])    calcifications in R breast - biopsy, took tamoxifen - completed 2018  . Chronic kidney disease   . Depression   . Diabetes mellitus     Type 2  . Disc degeneration, lumbar   . Fibromyalgia   . Hyperlipidemia   . Hypertension    under control, has been on med. > 1 yr.  H[place] Dyer body movements    while awake and asleep  . 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 Obstructive sleep apnea of adult 09/21/2016  . Proteinuria    KIdney clinic in [place] following pt.   . Shortness of breath dyspnea    with exertion  . Vertigo 2015  . Wears dentures    upper  . Wears partial dentures    lower    Past Surgical History:  Procedure Laterality Date  . ABLATION     uterine   . BREAST BIOPSY Left 2014   benign  . BREAST BIOPSY Right 2013   high risk  . BREAST LUMPECTOMY WITH NEEDLE LOCALIZATION  08/15/2012   Procedure: BREAST LUMPECTOMY WITH NEEDLE LOCALIZATION;   Surgeon: Haywood Lasso, MD;  Location: [facility];  Service: General;  Laterality: Right;  Wire localizations Right breast calcifications  . BREAST SURGERY    . CARPAL TUNNEL RELEASE Right 11/24/2017   Procedure: RIGHT CARPAL TUNNEL RELEASE;  Surgeon: Ashok Pall, MD;  Location: [facility];  Service: Neurosurgery;  Laterality: Right;  Right CARPAL TUNNEL RELEASE  . CARPAL TUNNEL RELEASE Left 08/31/2018   Procedure: LEFT CARPAL TUNNEL RELEASE;  Surgeon: Ashok Pall, MD;  Location: [facility];  Service: Neurosurgery;  Laterality: Left;  . DILITATION & CURRETTAGE/HYSTROSCOPY WITH THERMACHOICE ABLATION N/A 08/21/2014   Procedure: DILATATION & CURETTAGE/HYSTEROSCOPY WITH THERMACHOICE ABLATION;  Surgeon: Jonnie Kind, MD;  Location: [facility];  Service: Gynecology;  Laterality: N/A;  . KNEE ARTHROSCOPY Left 10/16/2015   Procedure: ARTHROSCOPY KNEE with debridment;  Surgeon: Frederik Pear, MD;  Location: [facility];  Service: Orthopedics;  Laterality: Left;  . KNEE ARTHROSCOPY WITH MEDIAL MENISECTOMY Right 07/17/2015   Procedure: KNEE ARTHROSCOPY WITH MEDIAL MENISECTOMY;  Surgeon: Frederik Pear, MD;  Location: [facility];  Service: Orthopedics;  Laterality: Right;  .  POLYPECTOMY N/A 08/21/2014   Procedure: ENDOMETRIAL POLYPECTOMY;  Surgeon: Jonnie Kind, MD;  Location: [facility];  Service: Gynecology;  Laterality: N/A;  . REFRACTIVE SURGERY Right    micro aneuysms  . ULNAR NERVE TRANSPOSITION Right 11/24/2017   Procedure: RIGHT ULNAR NERVE DECOMPRESSION/TRANSPOSITION;  Surgeon: Ashok Pall, MD;  Location: [facility];  Service: Neurosurgery;  Laterality: Right;  Right ULNAR NERVE DECOMPRESSION/TRANSPOSITION    There were no vitals filed for this visit.  Subjective Assessment - 12/21/18 0855    Pertinent History  Lt CTR 08/31/18. PMH: RT CTR and Rt ulnar n. decompression 11/24/17, fibromyalgia, bilateral knee arthoscopy    Limitations  no lifting > 10 lbs    Patient  Stated Goals  decrease pain and grip strength to hold things better    Currently in Pain?  Yes    Pain Score  2     Pain Location  Hand    Pain Orientation  Left    Pain Descriptors / Indicators  Aching    Pain Type  Acute pain    Pain Onset  More than a month ago    Pain Frequency  Intermittent    Aggravating Factors   cold weather    Pain Relieving Factors  heat         [facility] OT Assessment - 12/21/18 0001      Hand Function   Right Hand Grip (lbs)  78    Left Hand Grip (lbs)  75               OT Treatments/Exercises (OP) - 12/21/18 0001      ADLs   ADL Comments  Pt reports she saw neurologist 2 days ago and discussed the numbness ulnar side of Lt hand (consistent w/ cubital tunnel syndrome) and he recommended making another appointment w/ Dr. Christella Noa. Reviewed positions to avoid (extreme elbow flexion) during day and while asleep to help alleviate symptoms. Reassessed grip strength w/ further improvements (above average grip strength bilaterally). Also assessed progress towards goals - anticipate d/c next week      Wrist Exercises   Other wrist exercises  wrist flexion, extension, and RD x 10 reps each w/ 2 lb. weight X 10 reps each, 2 sets      Hand Exercises   Other Hand Exercises  Reviewed putty HEP and upgraded to green resistance. Pt demo each. Added lateral pinch strength      LUE Fluidotherapy   Number Minutes Fluidotherapy  10 Minutes    LUE Fluidotherapy Location  Hand;Wrist    Comments  to decr. stiffness and pain             OT Education - 12/21/18 0923    Education Details  wrist strengthening HEP     Person(s) Educated  Patient    Methods  Explanation;Demonstration;Handout    Comprehension  Verbalized understanding;Returned demonstration       OT Short Term Goals - 12/21/18 0942      OT SHORT TERM GOAL #1   Title  Independent with initial HEP     Baseline  not issued     Time  4    Period  Weeks    Status  Achieved      OT SHORT  TERM GOAL #2   Title  Independent with scar massage    Baseline  pt shown how to perform, will need review    Time  4    Period  Weeks    Status  Achieved      OT SHORT TERM GOAL #3   Title  Pain consistently less than or equal to 5/10 during functional light activities and BADLS    Baseline  up to 10/10    Time  4    Period  Weeks    Status  Achieved   Pt still reports pain occasionally up to 10/10 but recovers quicker from pain       OT Long Term Goals - 12/21/18 0942      OT LONG TERM GOAL #1   Title  Independent w/ updated HEP    Baseline  Dependent    Time  8    Period  Weeks    Status  Achieved      OT LONG TERM GOAL #2   Title  Grip strength Lt hand to be 65 lbs or greater for gripping, lifting, opening tight jars/containers    Baseline  Lt = 55 lbs    Time  8    Period  Weeks    Status  Achieved   12/05/18 = 63 lbs, 12/21/18 = 75 lbs     OT LONG TERM GOAL #3   Title  Lateral pinch and 3 tip pinch to increase by 2 lbs each Lt hand    Baseline  Lat = 10, 3 tip = 8     Time  8    Period  Weeks    Status  On-going      OT LONG TERM GOAL #4   Title  Pt to report less drops Lt hand during functional tasks    Baseline  50% of the time    Time  8    Period  Weeks    Status  Achieved            Plan - 12/21/18 0943    Clinical Impression Statement  Pt has met all STG's and 3/4 LTG's at this time. Pt has improved significantly w/ grip strength    Occupational Profile and client history currently impacting functional performance  PMH: Rt CTR 11/24/17, Fibromyalgia    Occupational performance deficits (Please refer to evaluation for details):  IADL's;ADL's;Work    OT Frequency  2x / week    OT Duration  4 weeks    OT Treatment/Interventions  Self-care/ADL training;Electrical Stimulation;Therapeutic exercise;Moist Heat;Paraffin;Splinting;Patient/family education;Fluidtherapy;Scar mobilization;Therapeutic activities;Cryotherapy;Ultrasound;DME and/or AE  instruction;Manual Therapy;Passive range of motion    Plan  fluido, check remaining goal, clothespin activity, possible d/c next session, if not following session    Consulted and Agree with Plan of Care  Patient       Patient will benefit from skilled therapeutic intervention in order to improve the following deficits and impairments:     Visit Diagnosis: Muscle weakness (generalized)  Pain in left wrist  Pain in left hand    Problem List Patient Active Problem List   Diagnosis Date Noted  . Pain in finger of right hand 06/15/2017  . Sprain of interphalangeal joint of right ring finger 06/15/2017  . 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 Idiopathic peripheral neuropathy 09/21/2016  . Obesity (BMI 35.0-39.9 without comorbidity) 09/21/2016  . Obstructive sleep apnea of adult 09/21/2016  . Oropharyngeal dysphagia 09/21/2016  . Vertigo 09/21/2016  . Menorrhagia with irregular cycle 07/25/2014  . Iron deficiency anemia due to chronic blood loss 07/25/2014  . Follow up 05/23/2014  . Fibroid, uterine 04/04/2014  . Abnormal uterine bleeding (AUB) 03/26/2014  . Diabetic peripheral neuropathy ([place])  12/21/2013  . Low back pain 12/11/2013  . Pain in joint, shoulder region 12/11/2013  . Pain in limb 12/11/2013  . 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 Diabetes ([place]) 07/28/2012  . Hypertension 07/28/2012  . Hypercholesteremia 07/28/2012  . Atypical lobular hyperplasia of breast 07/13/2012    Carey Bullocks, OTR/L 12/21/2018, 9:45 AM  [facility] [address] [place], [place], [address] Phone: [phone]   Fax:  [phone]  Name: WARREN KUGELMAN MRN: [id number] Date of Birth: 01-10-1971

## 2018-12-21 NOTE — Patient Instructions (Signed)
Wrist Extension: Resisted    With left palm down, __2__ pound weight in hand, bend wrist up. Return slowly. Repeat __10__ times per set. Do _2___ sets per session. Do __2__ sessions per day.  Wrist Radial Deviation: Resisted    With left thumb up, __2__ pound weight in hand, bend wrist up. Return slowly. Repeat _10___ times per set. Do __2__ sets per session. Do __2__ sessions per day.  Flexion (Resistive)    With hand palm-up and holding __2__ pound, bend hand toward you at wrist. Hold __2__ seconds. Relax slowly. Repeat _10___ times. Do 2 sets per session. Do _2___ sessions per day.

## 2018-12-22 ENCOUNTER — Ambulatory Visit: Payer: Medicaid Other | Admitting: Occupational Therapy

## 2018-12-26 ENCOUNTER — Other Ambulatory Visit: Payer: Self-pay

## 2018-12-26 ENCOUNTER — Ambulatory Visit: Payer: Medicaid Other | Admitting: Occupational Therapy

## 2018-12-26 DIAGNOSIS — M6281 Muscle weakness (generalized): Secondary | ICD-10-CM

## 2018-12-26 DIAGNOSIS — M79642 Pain in left hand: Secondary | ICD-10-CM | POA: Diagnosis not present

## 2018-12-26 NOTE — Therapy (Signed)
[facility] [address] [place], [place], [address] Phone: [phone]   Fax:  [phone]  Occupational Therapy Treatment  Patient Details  Name: Alicia Coffey MRN: [id number] Date of Birth: [birth year] Referring Provider (OT): Dr. Christella Noa   Encounter Date: 12/26/2018  OT End of Session - 12/26/18 0939    Visit Number  8    Number of Visits  12    Date for OT Re-Evaluation  01/10/19    Authorization Type  MCD    Authorization Time Period  approved 3 visits, 2/12 - 12/13/18, approved 8 more 3/4 - 01/10/19    Authorization - Visit Number  4    Authorization - Number of Visits  8    OT Start Time  0900   pt arrived late   OT Stop Time  0930    OT Time Calculation (min)  30 min    Activity Tolerance  Patient tolerated treatment well    Behavior During Therapy  [facility] for tasks assessed/performed       Past Medical History:  Diagnosis Date  . Anemia   . Anxiety   . Arthritis    right knee  . Cancer ([place])    calcifications in R breast - biopsy, took tamoxifen - completed 2018  . Chronic kidney disease   . Depression   . Diabetes mellitus     Type 2  . Disc degeneration, lumbar   . Fibromyalgia   . Hyperlipidemia   . Hypertension    under control, has been on med. > 1 yr.  [place] D[place] body movements    while awake and asleep  . 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 Obstructive sleep apnea of adult 09/21/2016  . Proteinuria    KIdney clinic in [place] following pt.   . Shortness of breath dyspnea    with exertion  . Vertigo 2015  . Wears dentures    upper  . Wears partial dentures    lower    Past Surgical History:  Procedure Laterality Date  . ABLATION     uterine   . BREAST BIOPSY Left 2014   benign  . BREAST BIOPSY Right 2013   high risk  . BREAST LUMPECTOMY WITH NEEDLE LOCALIZATION  08/15/2012   Procedure: BREAST LUMPECTOMY WITH  NEEDLE LOCALIZATION;  Surgeon: Haywood Lasso, MD;  Location: [facility];  Service: General;  Laterality: Right;  Wire localizations Right breast calcifications  . BREAST SURGERY    . CARPAL TUNNEL RELEASE Right 11/24/2017   Procedure: RIGHT CARPAL TUNNEL RELEASE;  Surgeon: Ashok Pall, MD;  Location: [facility];  Service: Neurosurgery;  Laterality: Right;  Right CARPAL TUNNEL RELEASE  . CARPAL TUNNEL RELEASE Left 08/31/2018   Procedure: LEFT CARPAL TUNNEL RELEASE;  Surgeon: Ashok Pall, MD;  Location: [facility];  Service: Neurosurgery;  Laterality: Left;  . DILITATION & CURRETTAGE/HYSTROSCOPY WITH THERMACHOICE ABLATION N/A 08/21/2014   Procedure: DILATATION & CURETTAGE/HYSTEROSCOPY WITH THERMACHOICE ABLATION;  Surgeon: Jonnie Kind, MD;  Location: [facility];  Service: Gynecology;  Laterality: N/A;  . KNEE ARTHROSCOPY Left 10/16/2015   Procedure: ARTHROSCOPY KNEE with debridment;  Surgeon: Frederik Pear, MD;  Location: [facility];  Service: Orthopedics;  Laterality: Left;  . KNEE ARTHROSCOPY WITH MEDIAL MENISECTOMY Right 07/17/2015   Procedure: KNEE ARTHROSCOPY WITH MEDIAL MENISECTOMY;  Surgeon: Frederik Pear, MD;  Location: [facility];  Service:  Orthopedics;  Laterality: Right;  . POLYPECTOMY N/A 08/21/2014   Procedure: ENDOMETRIAL POLYPECTOMY;  Surgeon: Jonnie Kind, MD;  Location: [facility];  Service: Gynecology;  Laterality: N/A;  . REFRACTIVE SURGERY Right    micro aneuysms  . ULNAR NERVE TRANSPOSITION Right 11/24/2017   Procedure: RIGHT ULNAR NERVE DECOMPRESSION/TRANSPOSITION;  Surgeon: Ashok Pall, MD;  Location: [facility];  Service: Neurosurgery;  Laterality: Right;  Right ULNAR NERVE DECOMPRESSION/TRANSPOSITION    There were no vitals filed for this visit.  Subjective Assessment - 12/26/18 0905    Subjective   I'd like to keep Wednesday's appointment    Pertinent History  Lt CTR 08/31/18. PMH: RT CTR and Rt ulnar n. decompression 11/24/17,  fibromyalgia, bilateral knee arthoscopy    Limitations  no lifting > 10 lbs    Patient Stated Goals  decrease pain and grip strength to hold things better    Currently in Pain?  No/denies         [facility] OT Assessment - 12/26/18 0001      Hand Function   Left Hand Lateral Pinch  10 lbs    Left 3 point pinch  8 lbs               OT Treatments/Exercises (OP) - 12/26/18 0001      ADLs   ADL Comments  Assessed remaining goal in prep for d/c - pt has made no changes in pinch strength      Hand Exercises   Other Hand Exercises  Clothespin activity (yellow to blue resistance) for pinch strength to place on antenna and then remove      Ultrasound   Ultrasound Location  at incision area of carpal tunnel    Ultrasound Parameters  0.8 wts/cm2, 20% pulsed, 3 Mhz x 8 min    Ultrasound Goals  --   scar management              OT Short Term Goals - 12/21/18 7322      OT SHORT TERM GOAL #1   Title  Independent with initial HEP     Baseline  not issued     Time  4    Period  Weeks    Status  Achieved      OT SHORT TERM GOAL #2   Title  Independent with scar massage    Baseline  pt shown how to perform, will need review    Time  4    Period  Weeks    Status  Achieved      OT SHORT TERM GOAL #3   Title  Pain consistently less than or equal to 5/10 during functional light activities and BADLS    Baseline  up to 10/10    Time  4    Period  Weeks    Status  Achieved   Pt still reports pain occasionally up to 10/10 but recovers quicker from pain       OT Long Term Goals - 12/21/18 0942      OT LONG TERM GOAL #1   Title  Independent w/ updated HEP    Baseline  Dependent    Time  8    Period  Weeks    Status  Achieved      OT LONG TERM GOAL #2   Title  Grip strength Lt hand to be 65 lbs or greater for gripping, lifting, opening tight jars/containers    Baseline  Lt = 55 lbs    Time  8    Period  Weeks    Status  Achieved   12/05/18 = 63 lbs, 12/21/18 = 75  lbs     OT LONG TERM GOAL #3   Title  Lateral pinch and 3 tip pinch to increase by 2 lbs each Lt hand    Baseline  Lat = 10, 3 tip = 8     Time  8    Period  Weeks    Status  On-going      OT LONG TERM GOAL #4   Title  Pt to report less drops Lt hand during functional tasks    Baseline  50% of the time    Time  8    Period  Weeks    Status  Achieved            Plan - 12/26/18 0939    Clinical Impression Statement  Pt progressing w/ decreased level of pain. No changes in pinch strength    Occupational Profile and client history currently impacting functional performance  PMH: Rt CTR 11/24/17, Fibromyalgia    Occupational performance deficits (Please refer to evaluation for details):  IADL's;ADL's;Work    Rehab Potential  Good    OT Frequency  2x / week    OT Duration  4 weeks    OT Treatment/Interventions  Self-care/ADL training;Electrical Stimulation;Therapeutic exercise;Moist Heat;Paraffin;Splinting;Patient/family education;Fluidtherapy;Scar mobilization;Therapeutic activities;Cryotherapy;Ultrasound;DME and/or AE instruction;Manual Therapy;Passive range of motion    Plan  fluido, pinch and grip strength, d/c next session    Consulted and Agree with Plan of Care  Patient       Patient will benefit from skilled therapeutic intervention in order to improve the following deficits and impairments:     Visit Diagnosis: Muscle weakness (generalized)    Problem List Patient Active Problem List   Diagnosis Date Noted  . Pain in finger of right hand 06/15/2017  . Sprain of interphalangeal joint of right ring finger 06/15/2017  . Diabetes 1.5, managed as type 2 ([place]) 09/21/2016  . Idiopathic peripheral neuropathy 09/21/2016  . Obesity (BMI 35.0-39.9 without comorbidity) 09/21/2016  . Obstructive sleep apnea of adult 09/21/2016  . Oropharyngeal dysphagia 09/21/2016  . Vertigo 09/21/2016  . Menorrhagia with irregular cycle 07/25/2014  . Iron deficiency anemia due to chronic  blood loss 07/25/2014  . Follow up 05/23/2014  . Fibroid, uterine 04/04/2014  . Abnormal uterine bleeding (AUB) 03/26/2014  . Diabetic peripheral neuropathy ([place]) 12/21/2013  . Low back pain 12/11/2013  . Pain in joint, shoulder region 12/11/2013  . Pain in limb 12/11/2013  . 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 Diabetes ([place]) 07/28/2012  . Hypertension 07/28/2012  . Hypercholesteremia 07/28/2012  . Atypical lobular hyperplasia of breast 07/13/2012    Alicia Coffey, Alicia Coffey 12/26/2018, 9:41 AM  [facility] [address] [place], [place], [address] Phone: [phone]   Fax:  [phone]  Name: Alicia Coffey MRN: [id number] Date of Birth: 17-Mar-1971

## 2018-12-28 ENCOUNTER — Ambulatory Visit: Payer: Medicaid Other | Admitting: Occupational Therapy

## 2018-12-28 DIAGNOSIS — M79642 Pain in left hand: Secondary | ICD-10-CM | POA: Diagnosis not present

## 2018-12-28 DIAGNOSIS — M25532 Pain in left wrist: Secondary | ICD-10-CM

## 2018-12-28 DIAGNOSIS — M6281 Muscle weakness (generalized): Secondary | ICD-10-CM

## 2018-12-28 NOTE — Therapy (Signed)
[facility] [address] [place], [place], [address] Phone: [phone]   Fax:  [phone]  Occupational Therapy Treatment  Patient Details  Name: Alicia Coffey MRN: [id number] Date of Birth: [birth year] Referring Provider (OT): Dr. Christella Noa   Encounter Date: 12/28/2018  OT End of Session - 12/28/18 0925    Visit Number  9    Number of Visits  12    Date for OT Re-Evaluation  01/10/19    Authorization Type  MCD    Authorization Time Period  approved 3 visits, 2/12 - 12/13/18, approved 8 more 3/4 - 01/10/19    Authorization - Visit Number  5    Authorization - Number of Visits  8    OT Start Time  0850    OT Stop Time  0930    OT Time Calculation (min)  40 min    Activity Tolerance  Patient tolerated treatment well    Behavior During Therapy  [facility] for tasks assessed/performed       Past Medical History:  Diagnosis Date  . Anemia   . Anxiety   . Arthritis    right knee  . Cancer ([place])    calcifications in R breast - biopsy, took tamoxifen - completed 2018  . Chronic kidney disease   . Depression   . Diabetes mellitus     Type 2  . Disc degeneration, lumbar   . Fibromyalgia   . Hyperlipidemia   . Hypertension    under control, has been on med. > 1 yr.  [place] body movements    while awake and asleep  . 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 Obstructive sleep apnea of adult 09/21/2016  . Proteinuria    [facility] in [place] following pt.   . Shortness of breath dyspnea    with exertion  . Vertigo 2015  . Wears dentures    upper  . Wears partial dentures    lower    Past Surgical History:  Procedure Laterality Date  . ABLATION     uterine   . BREAST BIOPSY Left 2014   benign  . BREAST BIOPSY Right 2013   high risk  . BREAST LUMPECTOMY WITH NEEDLE LOCALIZATION  08/15/2012   Procedure: BREAST LUMPECTOMY WITH NEEDLE LOCALIZATION;   Surgeon: Haywood Lasso, MD;  Location: [place];  Service: General;  Laterality: Right;  Wire localizations Right breast calcifications  . BREAST SURGERY    . CARPAL TUNNEL RELEASE Right 11/24/2017   Procedure: RIGHT CARPAL TUNNEL RELEASE;  Surgeon: Ashok Pall, MD;  Location: [place];  Service: Neurosurgery;  Laterality: Right;  Right CARPAL TUNNEL RELEASE  . CARPAL TUNNEL RELEASE Left 08/31/2018   Procedure: LEFT CARPAL TUNNEL RELEASE;  Surgeon: Ashok Pall, MD;  Location: [place];  Service: Neurosurgery;  Laterality: Left;  . DILITATION & CURRETTAGE/HYSTROSCOPY WITH THERMACHOICE ABLATION N/A 08/21/2014   Procedure: DILATATION & CURETTAGE/HYSTEROSCOPY WITH THERMACHOICE ABLATION;  Surgeon: Jonnie Kind, MD;  Location: [facility];  Service: Gynecology;  Laterality: N/A;  . KNEE ARTHROSCOPY Left 10/16/2015   Procedure: ARTHROSCOPY KNEE with debridment;  Surgeon: Frederik Pear, MD;  Location: [place];  Service: Orthopedics;  Laterality: Left;  . KNEE ARTHROSCOPY WITH MEDIAL MENISECTOMY Right 07/17/2015   Procedure: KNEE ARTHROSCOPY WITH MEDIAL MENISECTOMY;  Surgeon: Frederik Pear, MD;  Location: [place];  Service: Orthopedics;  Laterality: Right;  .  POLYPECTOMY N/A 08/21/2014   Procedure: ENDOMETRIAL POLYPECTOMY;  Surgeon: Jonnie Kind, MD;  Location: [facility];  Service: Gynecology;  Laterality: N/A;  . REFRACTIVE SURGERY Right    micro aneuysms  . ULNAR NERVE TRANSPOSITION Right 11/24/2017   Procedure: RIGHT ULNAR NERVE DECOMPRESSION/TRANSPOSITION;  Surgeon: Ashok Pall, MD;  Location: [place];  Service: Neurosurgery;  Laterality: Right;  Right ULNAR NERVE DECOMPRESSION/TRANSPOSITION    There were no vitals filed for this visit.  Subjective Assessment - 12/28/18 0857    Subjective   I have some joint pain unrelated to the CTS    Pertinent History  Lt CTR 08/31/18. PMH: RT CTR and Rt ulnar n. decompression 11/24/17, fibromyalgia, bilateral knee  arthoscopy    Limitations  no lifting > 10 lbs    Patient Stated Goals  decrease pain and grip strength to hold things better    Currently in Pain?  Yes    Pain Score  2     Pain Location  Hand    Pain Orientation  Left    Pain Descriptors / Indicators  Aching    Pain Type  Acute pain    Pain Onset  More than a month ago    Pain Frequency  Intermittent    Aggravating Factors   cold weather, overuse    Pain Relieving Factors  heat         [facility] OT Assessment - 12/28/18 0001      Hand Function   Left Hand Lateral Pinch  10.5 lbs    Left 3 point pinch  8.5 lbs               OT Treatments/Exercises (OP) - 12/28/18 0001      Wrist Exercises   Other wrist exercises  wrist flexion, extension, and RD x 10 reps each w/ 2 lb. weight X 10 reps each, 2 sets      Hand Exercises   Other Hand Exercises  Gripper set at level 3 resistance to pick up blocks Lt hand for sustained grip strength w/ 3-4 short rest breaks needed    Other Hand Exercises  Clothespin activity (yellow to blue resistance) for pinch strength to place on antenna and then remove      LUE Fluidotherapy   Number Minutes Fluidotherapy  12 Minutes    LUE Fluidotherapy Location  Hand;Wrist    Comments  At beginning of session to decr. pain               OT Short Term Goals - 12/21/18 0942      OT SHORT TERM GOAL #1   Title  Independent with initial HEP     Baseline  not issued     Time  4    Period  Weeks    Status  Achieved      OT SHORT TERM GOAL #2   Title  Independent with scar massage    Baseline  pt shown how to perform, will need review    Time  4    Period  Weeks    Status  Achieved      OT SHORT TERM GOAL #3   Title  Pain consistently less than or equal to 5/10 during functional light activities and BADLS    Baseline  up to 10/10    Time  4    Period  Weeks    Status  Achieved   Pt still reports pain occasionally up to 10/10 but recovers quicker from  pain       OT Long Term  Goals - 12/28/18 0925      OT LONG TERM GOAL #1   Title  Independent w/ updated HEP    Baseline  Dependent    Time  8    Period  Weeks    Status  Achieved      OT LONG TERM GOAL #2   Title  Grip strength Lt hand to be 65 lbs or greater for gripping, lifting, opening tight jars/containers    Baseline  Lt = 55 lbs    Time  8    Period  Weeks    Status  Achieved   12/05/18 = 63 lbs, 12/21/18 = 75 lbs     OT LONG TERM GOAL #3   Title  Lateral pinch and 3 tip pinch to increase by 2 lbs each Lt hand    Baseline  Lat = 10, 3 tip = 8     Time  8    Period  Weeks    Status  Not Met   lat = 10.5, 3 tip = 8.5      OT LONG TERM GOAL #4   Title  Pt to report less drops Lt hand during functional tasks    Baseline  50% of the time    Time  8    Period  Weeks    Status  Achieved            Plan - 12/28/18 0926    Clinical Impression Statement  Pt has met all STG's and 3/4 LTG's at this time.     Occupational Profile and client history currently impacting functional performance  PMH: Rt CTR 11/24/17, Fibromyalgia    Occupational performance deficits (Please refer to evaluation for details):  IADL's;ADL's;Work    Rehab Potential  Good    OT Frequency  2x / week    OT Duration  4 weeks    OT Treatment/Interventions  Self-care/ADL training;Electrical Stimulation;Therapeutic exercise;Moist Heat;Paraffin;Splinting;Patient/family education;Fluidtherapy;Scar mobilization;Therapeutic activities;Cryotherapy;Ultrasound;DME and/or AE instruction;Manual Therapy;Passive range of motion    Plan  D/C O.T.    Consulted and Agree with Plan of Care  Patient       Patient will benefit from skilled therapeutic intervention in order to improve the following deficits and impairments:     Visit Diagnosis: Muscle weakness (generalized)  Pain in left wrist    Problem List Patient Active Problem List   Diagnosis Date Noted  . Pain in finger of right hand 06/15/2017  . Sprain of interphalangeal  joint of right ring finger 06/15/2017  . 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 Idiopathic peripheral neuropathy 09/21/2016  . Obesity (BMI 35.0-39.9 without comorbidity) 09/21/2016  . Obstructive sleep apnea of adult 09/21/2016  . Oropharyngeal dysphagia 09/21/2016  . Vertigo 09/21/2016  . Menorrhagia with irregular cycle 07/25/2014  . Iron deficiency anemia due to chronic blood loss 07/25/2014  . Follow up 05/23/2014  . Fibroid, uterine 04/04/2014  . Abnormal uterine bleeding (AUB) 03/26/2014  . Diabetic peripheral neuropathy ([place]) 12/21/2013  . Low back pain 12/11/2013  . Pain in joint, shoulder region 12/11/2013  . Pain in limb 12/11/2013  . 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 Diabetes ([place]) 07/28/2012  . Hypertension 07/28/2012  . Hypercholesteremia 07/28/2012  . Atypical lobular hyperplasia of breast 07/13/2012   OCCUPATIONAL THERAPY DISCHARGE SUMMARY  Visits from Start of Care: 9  Current functional level related to goals / functional  outcomes: SEE ABOVE   Remaining deficits: Occasional mild pain Pinch strength   Education / Equipment: HEP's, scar massage  Plan: Patient agrees to discharge.  Patient goals were met. Patient is being discharged due to meeting the stated rehab goals.  ?????        Carey Bullocks, OTR/L 12/28/2018, 9:41 AM  [facility] [address] [place], [place], [address] Phone: [phone]   Fax:  [phone]  Name: Alicia Coffey MRN: [id number] Date of Birth: 1971/04/16

## 2018-12-29 ENCOUNTER — Other Ambulatory Visit: Payer: Self-pay | Admitting: Nurse Practitioner

## 2018-12-29 ENCOUNTER — Encounter: Payer: Self-pay | Admitting: Podiatry

## 2018-12-29 DIAGNOSIS — Z1231 Encounter for screening mammogram for malignant neoplasm of breast: Secondary | ICD-10-CM

## 2018-12-29 NOTE — Progress Notes (Signed)
Subjective: Alicia Coffey presents with history of diabetes, diabetic neuropathy for preventative diabetic foot care.  She is seen for chronic painful, discolored, thick toenails and painful corns and calluses which interfere with activities of daily living. Pain is aggravated when wearing enclosed shoe gear. Pain is relieved with periodic professional debridement.  Vonna Drafts, FNP is her primary care physician.   Current Outpatient Medications:  .  ACCU-CHEK AVIVA PLUS test strip, use 1 test strips 2 times daily, Disp: , Rfl: 5 .  acetaminophen (TYLENOL) 500 MG tablet, Take 1,000 mg by mouth every 6 (six) hours as needed for mild pain or headache., Disp: , Rfl:  .  acetaminophen-codeine (TYLENOL #3) 300-30 MG tablet, Take 1 tablet by mouth every 6 (six) hours as needed for moderate pain., Disp: 30 tablet, Rfl: 0 .  albuterol (PROVENTIL HFA;VENTOLIN HFA) 108 (90 Base) MCG/ACT inhaler, Inhale 2 puffs into the lungs every 6 (six) hours as needed for wheezing or shortness of breath. , Disp: , Rfl:  .  ALLERGY 10 MG tablet, Take 20 mg by mouth daily. , Disp: , Rfl: 2 .  amitriptyline (ELAVIL) 100 MG tablet, Take 100 mg by mouth at bedtime., Disp: , Rfl:  .  benazepril (LOTENSIN) 20 MG tablet, Take 30 mg by mouth daily., Disp: , Rfl:  .  bisacodyl (DULCOLAX) 5 MG EC tablet, Take 5 mg by mouth 2 (two) times daily. , Disp: , Rfl:  .  Blood Glucose Monitoring Suppl (ACCU-CHEK AVIVA PLUS) w/Device KIT, USE TO CHECK BLOOD SUGAR, Disp: , Rfl: 0 .  bumetanide (BUMEX) 0.5 MG tablet, Take 1 mg by mouth 2 (two) times daily. , Disp: , Rfl:  .  Cholecalciferol 25 MCG (1000 UT) capsule, Take 1,000 Units by mouth 2 (two) times daily., Disp: , Rfl:  .  ciclopirox (LOPROX) 0.77 % cream, Apply 1 application topically 2 (two) times daily as needed (itching). , Disp: , Rfl:  .  CLOBETASOL PROPIONATE E 0.05 % emollient cream, Apply 1 application topically 2 (two) times daily as needed (itching). , Disp: , Rfl:  0 .  DULoxetine (CYMBALTA) 60 MG capsule, Take 1 capsule (60 mg total) by mouth daily. (Patient taking differently: Take 60 mg by mouth at bedtime. ), Disp: 30 capsule, Rfl: 12 .  Exenatide ER (BYDUREON) 2 MG PEN, Inject 1 Dose into the skin once a week. (Patient taking differently: Inject 2 mg into the skin once a week. On Sundays), Disp: , Rfl:  .  FERREX 150 150 MG capsule, Take 150 mg by mouth 2 (two) times daily. , Disp: , Rfl: 2 .  hydrocortisone cream 1 %, Apply to affected area 2 times daily (Patient taking differently: Apply 1 application topically daily as needed for itching (bites). Apply to affected area 2 times daily), Disp: 15 g, Rfl: 0 .  hydrOXYzine (ATARAX/VISTARIL) 25 MG tablet, Take 25 mg by mouth at bedtime., Disp: , Rfl:  .  insulin aspart (NOVOLOG) 100 UNIT/ML injection, Inject 2-15 Units into the skin 2 (two) times daily before a meal. Per sliding scale for BS is 121 or greater, Disp: , Rfl:  .  insulin glargine (LANTUS) 100 UNIT/ML injection, Inject 0.28 mLs (28 Units total) into the skin at bedtime. (Patient taking differently: Inject 38 Units into the skin at bedtime. ), Disp: 10 mL, Rfl:  .  Iron-FA-B Cmp-C-Biot-Probiotic (FUSION PLUS) CAPS, Take by mouth., Disp: , Rfl:  .  lidocaine (LIDODERM) 5 %, Place 1 patch onto the skin  daily as needed (pain). Remove & Discard patch within 12 hours or as directed by MD, Disp: , Rfl:  .  Magnesium 250 MG TABS, Take 250 mg by mouth 2 (two) times daily., Disp: , Rfl:  .  meloxicam (MOBIC) 15 MG tablet, Take 15 mg by mouth daily as needed for pain., Disp: , Rfl:  .  methocarbamol (ROBAXIN) 500 MG tablet, Take 500 mg by mouth 2 (two) times daily., Disp: , Rfl:  .  metoprolol tartrate (LOPRESSOR) 25 MG tablet, Take 25 mg by mouth daily with breakfast. , Disp: , Rfl:  .  NOVOFINE PLUS 32G X 4 MM MISC, USE 1 PEN NEEDLE 3 TIMES DAILY, Disp: , Rfl: 6 .  Omega-3 Fatty Acids (EQL OMEGA 3 FISH OIL) 1400 MG CAPS, Take 1,400 mg by mouth every  evening., Disp: , Rfl:  .  Ondansetron HCl (ZOFRAN PO), Take by mouth., Disp: , Rfl:  .  OVER THE COUNTER MEDICATION, Take 1 capsule by mouth daily. Active Lean Keto Dietary Supplement, Disp: , Rfl:  .  PREVIDENT 5000 SENSITIVE 1.1-5 % PSTE, Take 1 application by mouth See admin instructions. After regular brushing, flossing and rinsing, brush a pea size of this paste for 2 minutes then spit it out thoroughly, Disp: , Rfl: 4 .  rosuvastatin (CRESTOR) 40 MG tablet, Take 40 mg by mouth at bedtime. , Disp: , Rfl:  .  sitaGLIPtin-metformin (JANUMET) 50-1000 MG tablet, Take 1 tablet by mouth 2 (two) times daily with a meal., Disp: , Rfl:  .  traMADol (ULTRAM) 50 MG tablet, Take 50 mg by mouth daily. , Disp: , Rfl:   Allergies  Allergen Reactions  . Penicillins Other (See Comments)    PATIENT HAS HAD A PCN REACTION WITH IMMEDIATE RASH, FACIAL/TONGUE/THROAT SWELLING, SOB, OR LIGHTHEADEDNESS WITH HYPOTENSION:  #  #  YES  #  #  Has patient had a PCN reaction causing severe rash involving mucus membranes or skin necrosis: No Has patient had a PCN reaction that required hospitalization No Has patient had a PCN reaction occurring within the last 10 years: No   . Other Hives, Itching and Rash    Powered gloves Arthropod Insect  . Seasonal Ic [Cholestatin] Other (See Comments)    Sneezing, runny nose    Vascular Examination: Capillary refill time immediate x 10 digits.  Dorsalis pedis 2/4 bilaterally.  Posterior tibial pulses 1/4 bilaterally.  No digital hair x 10 digits.  Skin temperature within normal limits bilaterally.  Dermatological Examination: Skin with normal turgor, texture and tone b/l.  Toenails 1-5 b/l discolored, thick, dystrophic with subungual debris and pain with palpation to nailbeds due to thickness of nails.  Hyperkeratotic lesion dorsal PIPJ bilateral fourth, bilateral fifth digits, submetatarsal head right, distal tip bilateral second digits and right third digit, and  submetatarsal head 1 left foot.  There is no erythema, no edema, no drainage, no flocculence noted with either of these lesions.  Musculoskeletal: Muscle strength 5/5 to all LE muscle groups  Hammertoe deformities digits 2 through 5 bilaterally.  Neurological: Sensation decreased with 10 gram monofilament.  Vibratory sensation diminished left foot, intact right foot.  Assessment: 1. Painful onychomycosis toenails 1-5 b/l 2. Callus submetatarsal head 2 right foot, submetatarsal head 1 left foot 3. Corns bilateral fourth, fifth, second, and right third digits 4. NIDDM with Diabetic neuropathy  Plan: 1. Continue diabetic foot care principles.  Literature dispensed on today. 2. Toenails 1-5 b/l were debrided in length and girth without iatrogenic  bleeding. 3. Corns and callosities pared dorsal PIPJ bilateral fourth, bilateral fifth digits, submetatarsal head right, distal tip bilateral second digits and right third digit, and submetatarsal head 1 left foot without incident. 4. Patient to continue soft, supportive shoe gear daily. 5. Patient to report any pedal injuries to medical professional immediately. 6. Follow up 3 months.  7. Patient/POA to call should there be a concern in the interim.

## 2019-01-09 ENCOUNTER — Ambulatory Visit: Payer: Medicaid Other | Admitting: Occupational Therapy

## 2019-01-10 ENCOUNTER — Encounter: Payer: Medicaid Other | Admitting: Occupational Therapy

## 2019-02-08 ENCOUNTER — Ambulatory Visit: Payer: Medicaid Other

## 2019-03-21 ENCOUNTER — Ambulatory Visit: Payer: Medicaid Other

## 2019-03-24 ENCOUNTER — Ambulatory Visit: Payer: Medicaid Other | Admitting: Podiatry

## 2019-04-17 ENCOUNTER — Ambulatory Visit: Payer: Medicaid Other | Admitting: Podiatry

## 2019-04-17 ENCOUNTER — Encounter: Payer: Self-pay | Admitting: Podiatry

## 2019-04-17 ENCOUNTER — Other Ambulatory Visit: Payer: Self-pay

## 2019-04-17 VITALS — Temp 97.5°F

## 2019-04-17 DIAGNOSIS — E1142 Type 2 diabetes mellitus with diabetic polyneuropathy: Secondary | ICD-10-CM | POA: Diagnosis not present

## 2019-04-17 DIAGNOSIS — B351 Tinea unguium: Secondary | ICD-10-CM

## 2019-04-17 DIAGNOSIS — L84 Corns and callosities: Secondary | ICD-10-CM

## 2019-04-17 DIAGNOSIS — M79676 Pain in unspecified toe(s): Secondary | ICD-10-CM | POA: Diagnosis not present

## 2019-04-17 DIAGNOSIS — T148XXA Other injury of unspecified body region, initial encounter: Secondary | ICD-10-CM

## 2019-04-17 DIAGNOSIS — M79609 Pain in unspecified limb: Secondary | ICD-10-CM

## 2019-04-17 NOTE — Progress Notes (Addendum)
Subjective: Alicia Coffey presents with h/o diabetic neuropathy for preventative diabetic foot care. She relates elongated, discolored, thick toenails and calluses which interfere with activities of daily living. Pain is aggravated when wearing enclosed shoe gear. Pain is relieved with periodic professional debridement.  She also states she has some discomfort in her right 3rd digit. She feels the nail is raised up and may come off. Secondary concern of blister that developed several weeks ago. She is unsure of mechanism or shoe gear worn when this appeared. Blister later dried up.   Vonna Drafts, FNP is her PCP and last visit was one month ago.   Current Outpatient Medications:  .  ACCU-CHEK AVIVA PLUS test strip, use 1 test strips 2 times daily, Disp: , Rfl: 5 .  acetaminophen (TYLENOL) 500 MG tablet, Take 1,000 mg by mouth every 6 (six) hours as needed for mild pain or headache., Disp: , Rfl:  .  acetaminophen-codeine (TYLENOL #3) 300-30 MG tablet, Take 1 tablet by mouth every 6 (six) hours as needed for moderate pain., Disp: 30 tablet, Rfl: 0 .  albuterol (PROVENTIL HFA;VENTOLIN HFA) 108 (90 Base) MCG/ACT inhaler, Inhale 2 puffs into the lungs every 6 (six) hours as needed for wheezing or shortness of breath. , Disp: , Rfl:  .  ALLERGY 10 MG tablet, Take 20 mg by mouth daily. , Disp: , Rfl: 2 .  amitriptyline (ELAVIL) 100 MG tablet, Take 100 mg by mouth at bedtime., Disp: , Rfl:  .  benazepril (LOTENSIN) 20 MG tablet, Take 30 mg by mouth daily., Disp: , Rfl:  .  bisacodyl (DULCOLAX) 5 MG EC tablet, Take 5 mg by mouth 2 (two) times daily. , Disp: , Rfl:  .  Blood Glucose Monitoring Suppl (ACCU-CHEK AVIVA PLUS) w/Device KIT, USE TO CHECK BLOOD SUGAR, Disp: , Rfl: 0 .  bumetanide (BUMEX) 0.5 MG tablet, Take 1 mg by mouth 2 (two) times daily. , Disp: , Rfl:  .  Cholecalciferol 25 MCG (1000 UT) capsule, Take 1,000 Units by mouth 2 (two) times daily., Disp: , Rfl:  .  ciclopirox (LOPROX)  0.77 % cream, Apply 1 application topically 2 (two) times daily as needed (itching). , Disp: , Rfl:  .  CLOBETASOL PROPIONATE E 0.05 % emollient cream, Apply 1 application topically 2 (two) times daily as needed (itching). , Disp: , Rfl: 0 .  DULoxetine (CYMBALTA) 60 MG capsule, Take 1 capsule (60 mg total) by mouth daily. (Patient taking differently: Take 60 mg by mouth at bedtime. ), Disp: 30 capsule, Rfl: 12 .  Exenatide ER (BYDUREON) 2 MG PEN, Inject 1 Dose into the skin once a week. (Patient taking differently: Inject 2 mg into the skin once a week. On Sundays), Disp: , Rfl:  .  FERREX 150 150 MG capsule, Take 150 mg by mouth 2 (two) times daily. , Disp: , Rfl: 2 .  hydrocortisone cream 1 %, Apply to affected area 2 times daily (Patient taking differently: Apply 1 application topically daily as needed for itching (bites). Apply to affected area 2 times daily), Disp: 15 g, Rfl: 0 .  hydrOXYzine (ATARAX/VISTARIL) 25 MG tablet, Take 25 mg by mouth at bedtime., Disp: , Rfl:  .  insulin aspart (NOVOLOG) 100 UNIT/ML injection, Inject 2-15 Units into the skin 2 (two) times daily before a meal. Per sliding scale for BS is 121 or greater, Disp: , Rfl:  .  insulin glargine (LANTUS) 100 UNIT/ML injection, Inject 0.28 mLs (28 Units total) into the  skin at bedtime. (Patient taking differently: Inject 38 Units into the skin at bedtime. ), Disp: 10 mL, Rfl:  .  Iron-FA-B Cmp-C-Biot-Probiotic (FUSION PLUS) CAPS, Take by mouth., Disp: , Rfl:  .  lidocaine (LIDODERM) 5 %, Place 1 patch onto the skin daily as needed (pain). Remove & Discard patch within 12 hours or as directed by MD, Disp: , Rfl:  .  Magnesium 250 MG TABS, Take 250 mg by mouth 2 (two) times daily., Disp: , Rfl:  .  meloxicam (MOBIC) 15 MG tablet, Take 15 mg by mouth daily as needed for pain., Disp: , Rfl:  .  methocarbamol (ROBAXIN) 500 MG tablet, Take 500 mg by mouth 2 (two) times daily., Disp: , Rfl:  .  metoprolol tartrate (LOPRESSOR) 25 MG  tablet, Take 25 mg by mouth daily with breakfast. , Disp: , Rfl:  .  NOVOFINE PLUS 32G X 4 MM MISC, USE 1 PEN NEEDLE 3 TIMES DAILY, Disp: , Rfl: 6 .  Omega-3 Fatty Acids (EQL OMEGA 3 FISH OIL) 1400 MG CAPS, Take 1,400 mg by mouth every evening., Disp: , Rfl:  .  Ondansetron HCl (ZOFRAN PO), Take by mouth., Disp: , Rfl:  .  OVER THE COUNTER MEDICATION, Take 1 capsule by mouth daily. Active Lean Keto Dietary Supplement, Disp: , Rfl:  .  PREVIDENT 5000 SENSITIVE 1.1-5 % PSTE, Take 1 application by mouth See admin instructions. After regular brushing, flossing and rinsing, brush a pea size of this paste for 2 minutes then spit it out thoroughly, Disp: , Rfl: 4 .  rosuvastatin (CRESTOR) 40 MG tablet, Take 40 mg by mouth at bedtime. , Disp: , Rfl:  .  sitaGLIPtin-metformin (JANUMET) 50-1000 MG tablet, Take 1 tablet by mouth 2 (two) times daily with a meal., Disp: , Rfl:  .  traMADol (ULTRAM) 50 MG tablet, Take 50 mg by mouth daily. , Disp: , Rfl:   Allergies  Allergen Reactions  . Penicillins Other (See Comments)    PATIENT HAS HAD A PCN REACTION WITH IMMEDIATE RASH, FACIAL/TONGUE/THROAT SWELLING, SOB, OR LIGHTHEADEDNESS WITH HYPOTENSION:  #  #  YES  #  #  Has patient had a PCN reaction causing severe rash involving mucus membranes or skin necrosis: No Has patient had a PCN reaction that required hospitalization No Has patient had a PCN reaction occurring within the last 10 years: No   . Other Hives, Itching and Rash    Powered gloves Arthropod Insect  . Seasonal Ic [Cholestatin] Other (See Comments)    Sneezing, runny nose    Objective: Vitals:   04/17/19 0954  Temp: (!) 97.5 F (36.4 C)    Vascular Examination: Capillary refill time immediate x 10 digits.  Dorsalis pedis pulses 2/4 b/l.  Posterior tibial pulses 1/4 b/l.  Digital hair absent x 10 digits.  Skin temperature gradient WNL b/l.  Dermatological Examination: Skin with normal turgor, texture and tone b/l.  Toenails  1-5 b/l discolored, thick, dystrophic with subungual debris and pain with palpation to nailbeds due to thickness of nails.  Hyperkeratotic lesion(s) b/l hallux submet head 1 left foot, submet head 2 right foot.  No erythema, no edema, no drainage, no flocculence noted.   Old blood blister noted medial hallux right foot. No erythema, no edema, no drainage. Resolved.  There is noted onchyolysis of entire nailplate of the right 3rd digit.  The nailbed remains intact. There is no erythema, no edema, no drainage, no underlying flocculence.  Musculoskeletal: Muscle strength 5/5 to all  LE muscle groups.  Hammertoes 2-5 b/l.  No pain, crepitus or joint limitation with passive/active ROM.  Neurological: Sensation decreased b/l with 10 gram monofilament.  Vibratory sensation diminished b/l.  Assessment: 1. Painful onychomycosis toenails 1-5 b/l 2. Calluses b/l hallux submet head 1 left foot, submet head 2 right foot  3. Blood blister right hallux 4. NIDDM with Diabetic neuropathy  Plan: 1. Continue diabetic foot care principles. Literature dispensed on today. 2. Toenails 1-5 b/l were debrided in length and girth without iatrogenic bleeding. 3. Loose nailplate right 3rd digit debrided in toto. Nailbed intact. Patient advised to keep moisturized with vaseline or triple antibiotic ointment daily.  4. Calluses pared b/l hallux submet head 1 left foot, submet head 2 right foot utilizing sterile scalpel blade without incident.  5. Blood blister resolved right hallux 6. Patient to continue soft, supportive shoe gear daily. 7. Patient to report any pedal injuries to medical professional  8. Follow up 3 months.  9. Patient/POA to call should there be a concern in the interim.

## 2019-04-17 NOTE — Patient Instructions (Signed)
Diabetes Mellitus and Foot Care Foot care is an important part of your health, especially when you have diabetes. Diabetes may cause you to have problems because of poor blood flow (circulation) to your feet and legs, which can cause your skin to:  Become thinner and drier.  Break more easily.  Heal more slowly.  Peel and crack. You may also have nerve damage (neuropathy) in your legs and feet, causing decreased feeling in them. This means that you may not notice minor injuries to your feet that could lead to more serious problems. Noticing and addressing any potential problems early is the best way to prevent future foot problems. How to care for your feet Foot hygiene  Wash your feet daily with warm water and mild soap. Do not use hot water. Then, pat your feet and the areas between your toes until they are completely dry. Do not soak your feet as this can dry your skin.  Trim your toenails straight across. Do not dig under them or around the cuticle. File the edges of your nails with an emery board or nail file.  Apply a moisturizing lotion or petroleum jelly to the skin on your feet and to dry, brittle toenails. Use lotion that does not contain alcohol and is unscented. Do not apply lotion between your toes. Shoes and socks  Wear clean socks or stockings every day. Make sure they are not too tight. Do not wear knee-high stockings since they may decrease blood flow to your legs.  Wear shoes that fit properly and have enough cushioning. Always look in your shoes before you put them on to be sure there are no objects inside.  To break in new shoes, wear them for just a few hours a day. This prevents injuries on your feet. Wounds, scrapes, corns, and calluses  Check your feet daily for blisters, cuts, bruises, sores, and redness. If you cannot see the bottom of your feet, use a mirror or ask someone for help.  Do not cut corns or calluses or try to remove them with medicine.  If you  find a minor scrape, cut, or break in the skin on your feet, keep it and the skin around it clean and dry. You may clean these areas with mild soap and water. Do not clean the area with peroxide, alcohol, or iodine.  If you have a wound, scrape, corn, or callus on your foot, look at it several times a day to make sure it is healing and not infected. Check for: ? Redness, swelling, or pain. ? Fluid or blood. ? Warmth. ? Pus or a bad smell. General instructions  Do not cross your legs. This may decrease blood flow to your feet.  Do not use heating pads or hot water bottles on your feet. They may burn your skin. If you have lost feeling in your feet or legs, you may not know this is happening until it is too late.  Protect your feet from hot and cold by wearing shoes, such as at the beach or on hot pavement.  Schedule a complete foot exam at least once a year (annually) or more often if you have foot problems. If you have foot problems, report any cuts, sores, or bruises to your health care provider immediately. Contact a health care provider if:  You have a medical condition that increases your risk of infection and you have any cuts, sores, or bruises on your feet.  You have an injury that is not   healing.  You have redness on your legs or feet.  You feel burning or tingling in your legs or feet.  You have pain or cramps in your legs and feet.  Your legs or feet are numb.  Your feet always feel cold.  You have pain around a toenail. Get help right away if:  You have a wound, scrape, corn, or callus on your foot and: ? You have pain, swelling, or redness that gets worse. ? You have fluid or blood coming from the wound, scrape, corn, or callus. ? Your wound, scrape, corn, or callus feels warm to the touch. ? You have pus or a bad smell coming from the wound, scrape, corn, or callus. ? You have a fever. ? You have a red line going up your leg. Summary  Check your feet every day  for cuts, sores, red spots, swelling, and blisters.  Moisturize feet and legs daily.  Wear shoes that fit properly and have enough cushioning.  If you have foot problems, report any cuts, sores, or bruises to your health care provider immediately.  Schedule a complete foot exam at least once a year (annually) or more often if you have foot problems. This information is not intended to replace advice given to you by your health care provider. Make sure you discuss any questions you have with your health care provider. Document Released: 09/25/2000 Document Revised: 11/10/2017 Document Reviewed: 10/30/2016 Elsevier Patient Education  2020 [employer]  

## 2019-04-28 ENCOUNTER — Ambulatory Visit
Admission: RE | Admit: 2019-04-28 | Discharge: 2019-04-28 | Disposition: A | Discharge status: Home / Self Care | Payer: Medicaid Other | Source: Ambulatory Visit | Attending: Nurse Practitioner | Admitting: Nurse Practitioner

## 2019-04-28 DIAGNOSIS — Z1231 Encounter for screening mammogram for malignant neoplasm of breast: Secondary | ICD-10-CM

## 2019-07-14 IMAGING — MG DIGITAL DIAGNOSTIC UNILATERAL LEFT MAMMOGRAM WITH TOMO AND CAD
6 series · 6 of 18 positions shown · non-contrast
Comparison: Previous exam(s).

CLINICAL DATA: 47-year-old female with history of right breast
excision for ALH and LCIS, presents for evaluation of a palpable
lump in the left breast.

EXAM:
DIGITAL DIAGNOSTIC LEFT MAMMOGRAM WITH CAD AND TOMO
ULTRASOUND LEFT BREAST

[L CC synth-2D]
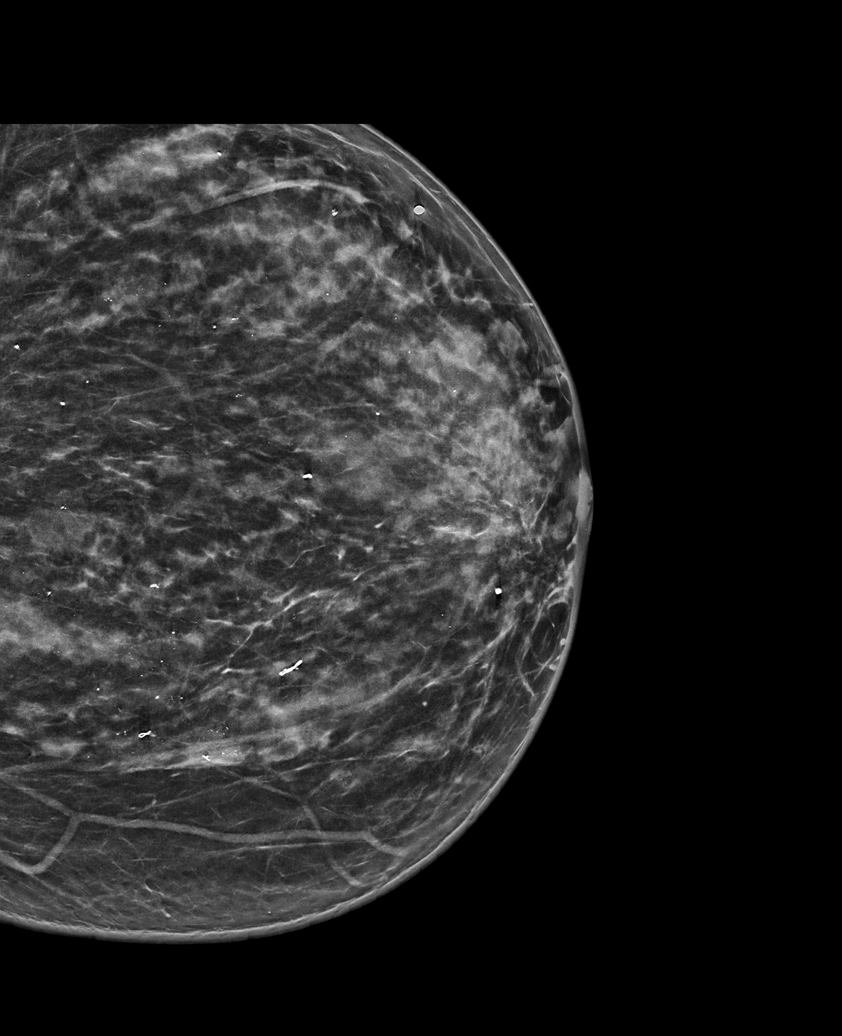

[L MLO synth-2D]
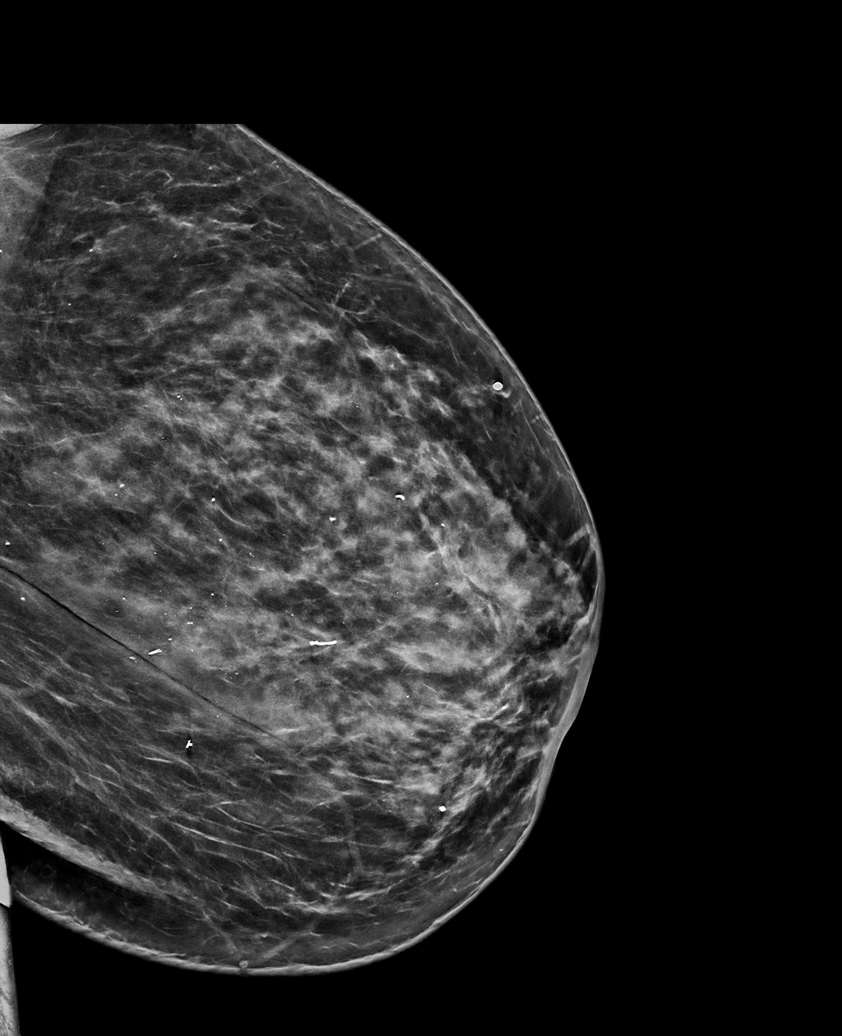

[L TAN synth-2D]
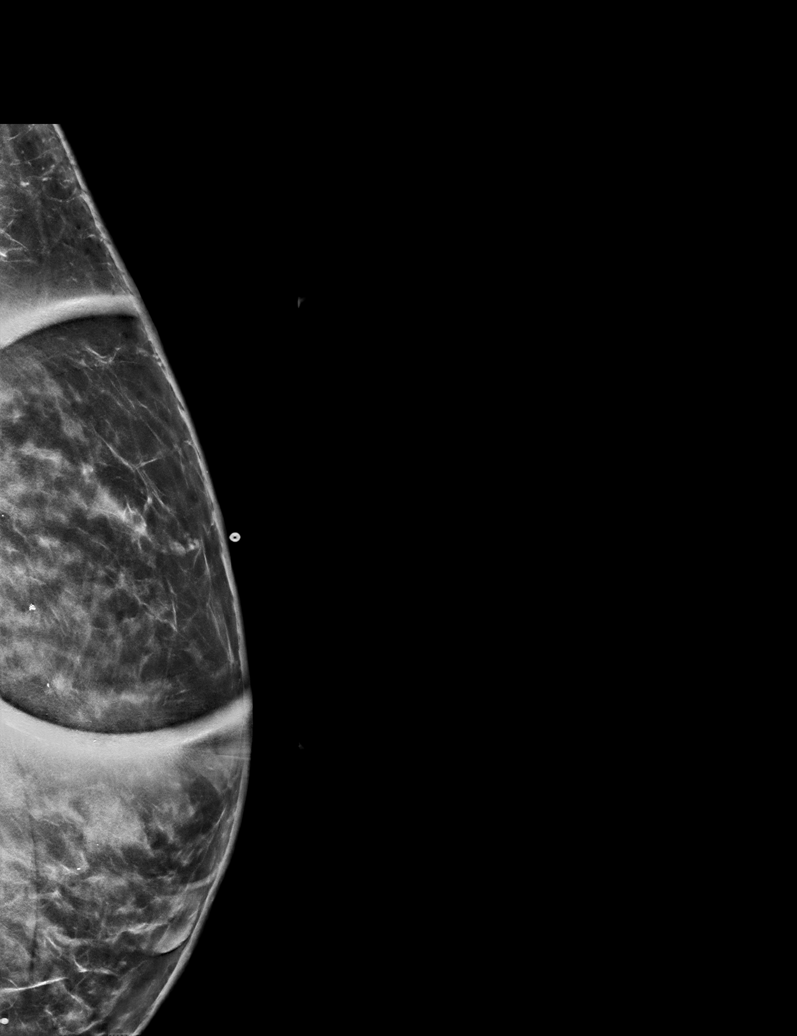

[L TAN tomo · tomo slice 39/77.0]
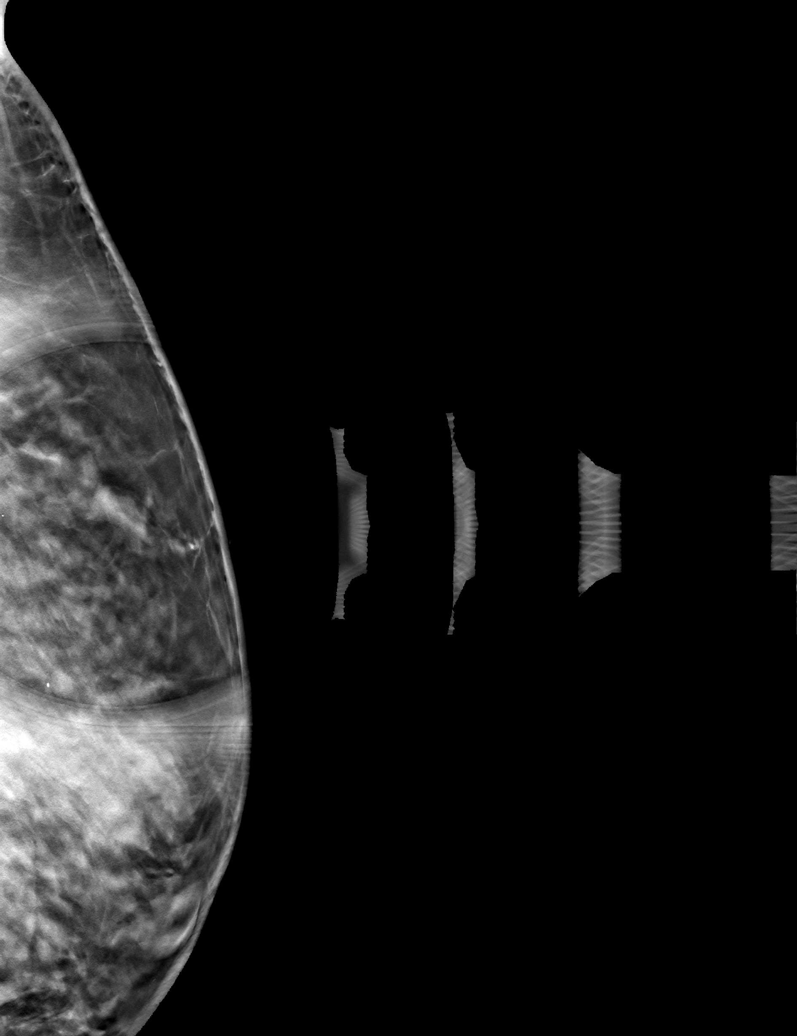

[L CC tomo · tomo slice 35/68.0]
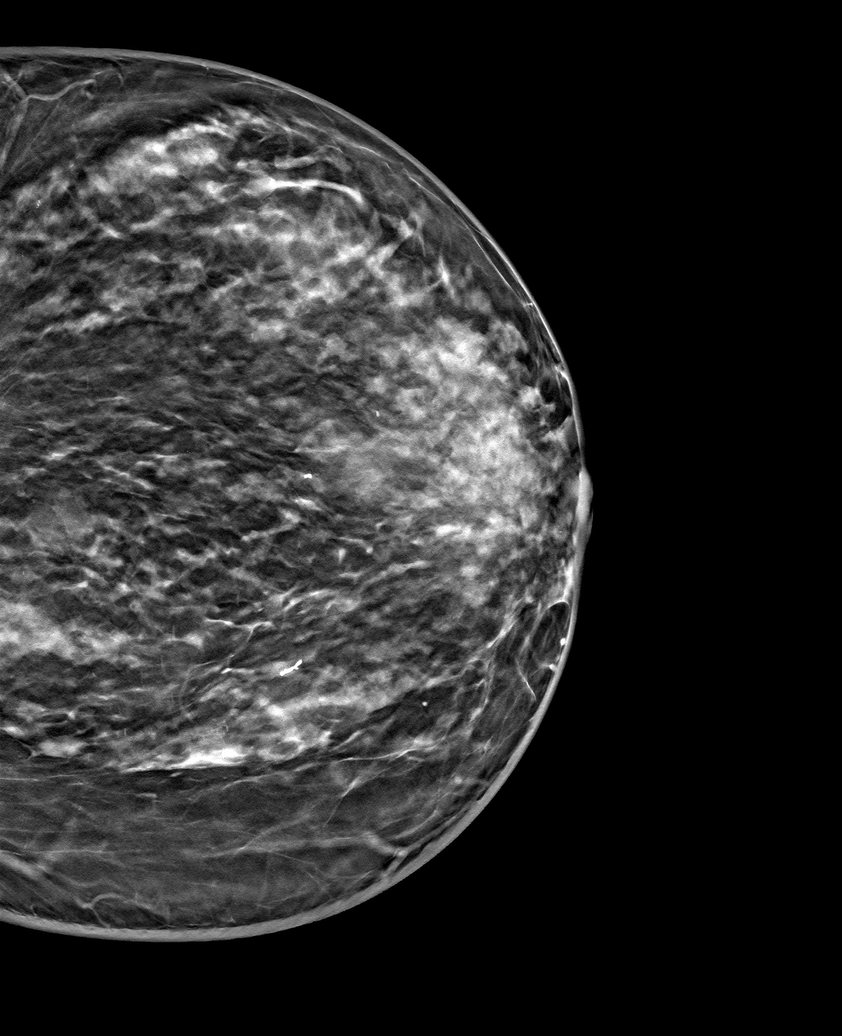

[L MLO tomo · tomo slice 40/79.0]
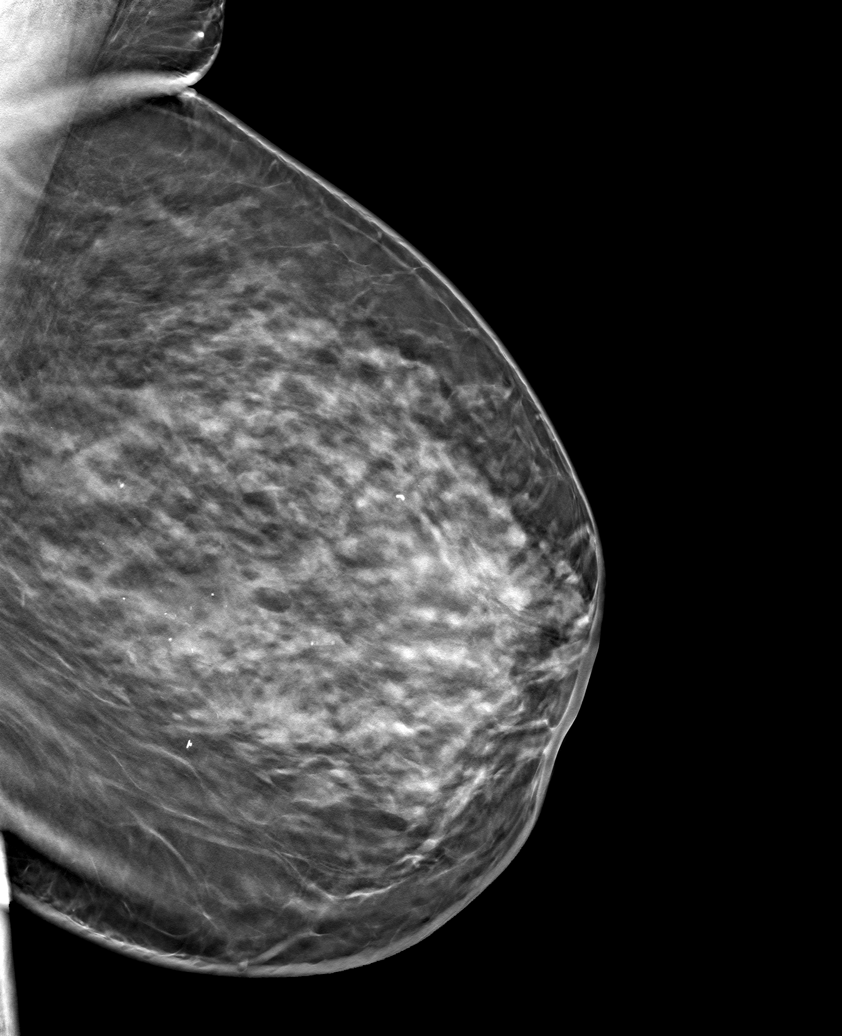

[6 of 18 positions shown; findings below may reference images not displayed]

ACR Breast Density Category c: The breast tissue is heterogeneously
dense, which may obscure small masses.
FINDINGS: A BB has been placed along the upper-outer aspect of the left breast
indicating the palpable site of concern. There are no suspicious
mammographic findings deep to the marker. No suspicious
calcifications, masses or areas of distortion are seen in the
bilateral breasts.

Mammographic images were processed with CAD.

Physical exam of the palpable site in the upper-outer left breast
demonstrates a small superficial, slightly protruding mass.

Ultrasound targeted to the left breast at [DATE], 6 cm from the
nipple, a hypoechoic lesion within the skin. There is some
vascularity within it on color doppler imaging. Otherwise, there is
no suspicious mass within the breast tissue.
IMPRESSION: 1. The palpable lump in the left breast is within the skin, and may
be an early developing sebaceous cyst.

RECOMMENDATION:
1. Clinical follow-up recommended. I advised the patient that if the
mass increases in size, or if there are changed to the skin such as
discoloration or thickening, she should inform her physician.

2. Return to routine screening mammography is recommended. The
patient will be due for screening in Friday January, 2019.

I have discussed the findings and recommendations with the patient.
Results were also provided in writing at the conclusion of the
visit. If applicable, a reminder letter will be sent to the patient
regarding the next appointment.

BI-RADS CATEGORY  2: Benign.

## 2019-07-17 ENCOUNTER — Ambulatory Visit: Payer: Medicaid Other | Admitting: Podiatry

## 2019-07-17 ENCOUNTER — Other Ambulatory Visit: Payer: Self-pay

## 2019-07-17 ENCOUNTER — Encounter: Payer: Self-pay | Admitting: Podiatry

## 2019-07-17 DIAGNOSIS — L97521 Non-pressure chronic ulcer of other part of left foot limited to breakdown of skin: Secondary | ICD-10-CM

## 2019-07-17 DIAGNOSIS — E11621 Type 2 diabetes mellitus with foot ulcer: Secondary | ICD-10-CM

## 2019-07-17 DIAGNOSIS — M79676 Pain in unspecified toe(s): Secondary | ICD-10-CM | POA: Diagnosis not present

## 2019-07-17 DIAGNOSIS — E1142 Type 2 diabetes mellitus with diabetic polyneuropathy: Secondary | ICD-10-CM

## 2019-07-17 DIAGNOSIS — B351 Tinea unguium: Secondary | ICD-10-CM | POA: Diagnosis not present

## 2019-07-17 NOTE — Patient Instructions (Signed)
Diabetic Neuropathy Diabetic neuropathy refers to nerve damage that is caused by diabetes (diabetes mellitus). Over time, people with diabetes can develop nerve damage throughout the body. There are several types of diabetic neuropathy:  Peripheral neuropathy. This is the most common type of diabetic neuropathy. It causes damage to nerves that carry signals between the spinal cord and other parts of the body (peripheral nerves). This usually affects nerves in the feet and legs first, and may eventually affect the hands and arms. The damage affects the ability to sense touch or temperature.  Autonomic neuropathy. This type causes damage to nerves that control involuntary functions (autonomic nerves). These nerves carry signals that control: ? Heartbeat. ? Body temperature. ? Blood pressure. ? Urination. ? Digestion. ? Sweating. ? Sexual function. ? Response to changing blood sugar (glucose) levels.  Focal neuropathy. This type of nerve damage affects one area of the body, such as an arm, a leg, or the face. The injury may involve one nerve or a small group of nerves. Focal neuropathy can be painful and unpredictable, and occurs most often in older adults with diabetes. This often develops suddenly, but usually improves over time and does not cause long-term problems.  Proximal neuropathy. This type of nerve damage affects the nerves of the thighs, hips, buttocks, or legs. It causes severe pain, weakness, and muscle death (atrophy), usually in the thigh muscles. It is more common among older men and people who have type 2 diabetes. The length of recovery time may vary. What are the causes? Peripheral, autonomic, and focal neuropathies are caused by diabetes that is not well controlled with treatment. The cause of proximal neuropathy is not known, but it may be caused by inflammation related to uncontrolled blood glucose levels. What are the signs or symptoms? Peripheral neuropathy Peripheral  neuropathy develops slowly over time. When the nerves of the feet and legs no longer work, you may experience:  Burning, stabbing, or aching pain in the legs or feet.  Pain or cramping in the legs or feet.  Loss of feeling (numbness) and inability to feel pressure or pain in the feet. This can lead to: ? Thick calluses or sores on areas of constant pressure. ? Ulcers. ? Reduced ability to feel temperature changes.  Foot deformities.  Muscle weakness.  Loss of balance or coordination. Autonomic neuropathy The symptoms of autonomic neuropathy vary depending on which nerves are affected. Symptoms may include:  Problems with digestion, such as: ? Nausea or vomiting. ? Poor appetite. ? Bloating. ? Diarrhea or constipation. ? Trouble swallowing. ? Losing weight without trying to.  Problems with the heart, blood and lungs, such as: ? Dizziness, especially when standing up. ? Fainting. ? Shortness of breath. ? Irregular heartbeat.  Bladder problems, such as: ? Trouble starting or stopping urination. ? Leaking urine. ? Trouble emptying the bladder. ? Urinary tract infections (UTIs).  Problems with other body functions, such as: ? Sweat. You may sweat too much or too little. ? Temperature. You might get hot easily. Or, you might feel cold more than usual. ? Sexual function. Men may not be able to get or maintain an erection. Women may have vaginal dryness and difficulty with arousal. Focal neuropathy Symptoms affect only one area of the body. Common symptoms include:  Numbness.  Tingling.  Burning pain.  Prickling feeling.  Very sensitive skin.  Weakness.  Inability to move (paralysis).  Muscle twitching.  Muscles getting smaller (wasting).  Poor coordination.  Double or blurred vision. Proximal   neuropathy  Sudden, severe pain in the hip, thigh, or buttocks. Pain may spread from the back into the legs (sciatica).  Pain and numbness in the arms and legs.   Tingling.  Loss of bladder control or bowel control.  Weakness and wasting of thigh muscles.  Difficulty getting up from a seated position.  Abdominal swelling.  Unexplained weight loss. How is this diagnosed? Diagnosis usually involves reviewing your medical history and any symptoms you have. Diagnosis varies depending on the type of neuropathy your health care provider suspects. Peripheral neuropathy Your health care provider will check areas that are affected by your nervous system (neurologic exam), such as your reflexes, how you move, and what you can feel. You may have other tests, such as:  Blood tests.  Removal and examination of fluid that surrounds the spinal cord (lumbar puncture).  CT scan.  MRI.  A test to check the nerves that control muscles (electromyogram, EMG).  Tests of how quickly messages pass through your nerves (nerve conduction velocity tests).  Removal of a small piece of nerve to be examined under a microscope (biopsy). Autonomic neuropathy You may have tests, such as:  Tests to measure your blood pressure and heart rate. This may include monitoring you while you are safely secured to an exam table that moves you from a lying position to an upright position (table tilt test).  Breathing tests to check your lungs.  Tests to check how food moves through the digestive system (gastric emptying tests).  Blood, sweat, or urine tests.  Ultrasound of your bladder.  Spinal fluid tests. Focal neuropathy This condition may be diagnosed with:  A neurologic exam.  CT scan.  MRI.  EMG.  Nerve conduction velocity tests. Proximal neuropathy There is no test to diagnose this type of neuropathy. You may have tests to rule out other possible causes of this type of neuropathy. Tests may include:  X-rays of your spine and lumbar region.  Lumbar puncture.  MRI. How is this treated? The goal of treatment is to keep nerve damage from getting worse.  The most important part of treatment is keeping your blood glucose level and your A1C level within your target range by following your diabetes management plan. Over time, maintaining lower blood glucose levels helps lessen symptoms. In some cases, you may need prescription pain medicine. Follow these instructions at home:  Lifestyle   Do not use any products that contain nicotine or tobacco, such as cigarettes and e-cigarettes. If you need help quitting, ask your health care provider.  Be physically active every day. Include strength training and balance exercises.  Follow a healthy meal plan.  Work with your health care provider to manage your blood pressure. General instructions  Follow your diabetes management plan as directed. ? Check your blood glucose levels as directed by your health care provider. ? Keep your blood glucose in your target range as directed by your health care provider. ? Have your A1C level checked at least two times a year, or as often as told by your health care provider.  Take over the counter and prescription medicines only as told by your health care provider. This includes insulin and diabetes medicine.  Do not drive or use heavy machinery while taking prescription pain medicines.  Check your skin and feet every day for cuts, bruises, redness, blisters, or sores.  Keep all follow up visits as told by your health care provider. This is important. Contact a health care provider if:    You have burning, stabbing, or aching pain in your legs or feet.  You are unable to feel pressure or pain in your feet.  You develop problems with digestion, such as: ? Nausea. ? Vomiting. ? Bloating. ? Constipation. ? Diarrhea. ? Abdominal pain.  You have difficulty with urination, such as inability: ? To control when you urinate (incontinence). ? To completely empty the bladder (retention).  You have palpitations.  You feel dizzy, weak, or faint when you stand  up. Get help right away if:  You cannot urinate.  You have sudden weakness or loss of coordination.  You have trouble speaking.  You have pain or pressure in your chest.  You have an irregular heart beat.  You have sudden inability to move a part of your body. Summary  Diabetic neuropathy refers to nerve damage that is caused by diabetes. It can affect nerves throughout the entire body, causing numbness and pain in the arms, legs, digestive tract, heart, and other body systems.  Keep your blood glucose level and your blood pressure in your target range, as directed by your health care provider. This can help prevent neuropathy from getting worse.  Check your skin and feet every day for cuts, bruises, redness, blisters, or sores.  Do not use any products that contain nicotine or tobacco, such as cigarettes and e-cigarettes. If you need help quitting, ask your health care provider. This information is not intended to replace advice given to you by your health care provider. Make sure you discuss any questions you have with your health care provider. Document Released: 12/07/2001 Document Revised: 11/10/2017 Document Reviewed: 11/02/2016 Elsevier Patient Education  2020 [employer]  Onychomycosis/Fungal Toenails  WHAT IS IT? An infection that lies within the keratin of your nail plate that is caused by a fungus.  WHY ME? Fungal infections affect all ages, sexes, races, and creeds.  There may be many factors that predispose you to a fungal infection such as age, coexisting medical conditions such as diabetes, or an autoimmune disease; stress, medications, fatigue, genetics, etc.  Bottom line: fungus thrives in a warm, moist environment and your shoes offer such a location.  IS IT CONTAGIOUS? Theoretically, yes.  You do not want to share shoes, nail clippers or files with someone who has fungal toenails.  Walking around barefoot in the same room or sleeping in the same bed is unlikely  to transfer the organism.  It is important to realize, however, that fungus can spread easily from one nail to the next on the same foot.  HOW DO WE TREAT THIS?  There are several ways to treat this condition.  Treatment may depend on many factors such as age, medications, pregnancy, liver and kidney conditions, etc.  It is best to ask your doctor which options are available to you.  1. No treatment.   Unlike many other medical concerns, you can live with this condition.  However for many people this can be a painful condition and may lead to ingrown toenails or a bacterial infection.  It is recommended that you keep the nails cut short to help reduce the amount of fungal nail. 2. Topical treatment.  These range from herbal remedies to prescription strength nail lacquers.  About 40-50% effective, topicals require twice daily application for approximately 9 to 12 months or until an entirely new nail has grown out.  The most effective topicals are medical grade medications available through physicians offices. 3. Oral antifungal medications.  With an 80-90% cure   rate, the most common oral medication requires 3 to 4 months of therapy and stays in your system for a year as the new nail grows out.  Oral antifungal medications do require blood work to make sure it is a safe drug for you.  A liver function panel will be performed prior to starting the medication and after the first month of treatment.  It is important to have the blood work performed to avoid any harmful side effects.  In general, this medication safe but blood work is required. 4. Laser Therapy.  This treatment is performed by applying a specialized laser to the affected nail plate.  This therapy is noninvasive, fast, and non-painful.  It is not covered by insurance and is therefore, out of pocket.  The results have been very good with a 80-95% cure rate.  [facility] is the only practice in the area to offer this therapy. 5. Permanent  Nail Avulsion.  Removing the entire nail so that a new nail will not grow back. 

## 2019-07-19 DIAGNOSIS — E1122 Type 2 diabetes mellitus with diabetic chronic kidney disease: Secondary | ICD-10-CM | POA: Insufficient documentation

## 2019-07-19 DIAGNOSIS — E559 Vitamin D deficiency, unspecified: Secondary | ICD-10-CM | POA: Insufficient documentation

## 2019-07-19 DIAGNOSIS — E6609 Other obesity due to excess calories: Secondary | ICD-10-CM | POA: Insufficient documentation

## 2019-07-19 DIAGNOSIS — N2581 Secondary hyperparathyroidism of renal origin: Secondary | ICD-10-CM | POA: Insufficient documentation

## 2019-07-20 NOTE — Progress Notes (Signed)
Subjective: Alicia Coffey is a 48 y.o. y.o. female who presents today for preventative diabetic foot care for follow up. She presents with chronic calluses and discolored, thick toenails and painful callus/corn which interfere with daily activities. Pain is aggravated when wearing enclosed shoe gear and relieved with periodic professional debridement.  Vonna Drafts, FNP is her PCP.   Current Outpatient Medications on File Prior to Visit  Medication Sig Dispense Refill  . ACCU-CHEK AVIVA PLUS test strip use 1 test strips 2 times daily  5  . acetaminophen (TYLENOL) 500 MG tablet Take 1,000 mg by mouth every 6 (six) hours as needed for mild pain or headache.    Marland Kitchen acetaminophen-codeine (TYLENOL #3) 300-30 MG tablet Take 1 tablet by mouth every 6 (six) hours as needed for moderate pain. 30 tablet 0  . albuterol (PROVENTIL HFA;VENTOLIN HFA) 108 (90 Base) MCG/ACT inhaler Inhale 2 puffs into the lungs every 6 (six) hours as needed for wheezing or shortness of breath.     . ALLERGY 10 MG tablet Take 20 mg by mouth daily.   2  . amitriptyline (ELAVIL) 100 MG tablet Take 100 mg by mouth at bedtime.    . benazepril (LOTENSIN) 20 MG tablet Take 30 mg by mouth daily.    . bisacodyl (DULCOLAX) 5 MG EC tablet Take 5 mg by mouth 2 (two) times daily.     . Blood Glucose Monitoring Suppl (ACCU-CHEK AVIVA PLUS) w/Device KIT USE TO CHECK BLOOD SUGAR  0  . bumetanide (BUMEX) 0.5 MG tablet Take 1 mg by mouth 2 (two) times daily.     . Cholecalciferol 25 MCG (1000 UT) capsule Take 1,000 Units by mouth 2 (two) times daily.    . ciclopirox (LOPROX) 0.77 % cream Apply 1 application topically 2 (two) times daily as needed (itching).     . CLOBETASOL PROPIONATE E 0.05 % emollient cream Apply 1 application topically 2 (two) times daily as needed (itching).   0  . DULoxetine (CYMBALTA) 60 MG capsule Take 1 capsule (60 mg total) by mouth daily. (Patient taking differently: Take 60 mg by mouth at bedtime. ) 30 capsule 12   . Exenatide ER (BYDUREON) 2 MG PEN Inject 1 Dose into the skin once a week. (Patient taking differently: Inject 2 mg into the skin once a week. On Sundays)    . FERREX 150 150 MG capsule Take 150 mg by mouth 2 (two) times daily.   2  . hydrocortisone cream 1 % Apply to affected area 2 times daily (Patient taking differently: Apply 1 application topically daily as needed for itching (bites). Apply to affected area 2 times daily) 15 g 0  . hydrOXYzine (ATARAX/VISTARIL) 25 MG tablet Take 25 mg by mouth at bedtime.    . insulin aspart (NOVOLOG) 100 UNIT/ML injection Inject 2-15 Units into the skin 2 (two) times daily before a meal. Per sliding scale for BS is 121 or greater    . insulin glargine (LANTUS) 100 UNIT/ML injection Inject 0.28 mLs (28 Units total) into the skin at bedtime. (Patient taking differently: Inject 38 Units into the skin at bedtime. ) 10 mL   . Iron-FA-B Cmp-C-Biot-Probiotic (FUSION PLUS) CAPS Take by mouth.    . lidocaine (LIDODERM) 5 % Place 1 patch onto the skin daily as needed (pain). Remove & Discard patch within 12 hours or as directed by MD    . Magnesium 250 MG TABS Take 250 mg by mouth 2 (two) times daily.    Marland Kitchen  meloxicam (MOBIC) 15 MG tablet Take 15 mg by mouth daily as needed for pain.    . methocarbamol (ROBAXIN) 500 MG tablet Take 500 mg by mouth 2 (two) times daily.    . metoprolol tartrate (LOPRESSOR) 25 MG tablet Take 25 mg by mouth daily with breakfast.     . NOVOFINE PLUS 32G X 4 MM MISC USE 1 PEN NEEDLE 3 TIMES DAILY  6  . Omega-3 Fatty Acids (EQL OMEGA 3 FISH OIL) 1400 MG CAPS Take 1,400 mg by mouth every evening.    . Ondansetron HCl (ZOFRAN PO) Take by mouth.    Marland Kitchen OVER THE COUNTER MEDICATION Take 1 capsule by mouth daily. Active Lean Keto Dietary Supplement    . PREVIDENT 5000 SENSITIVE 1.1-5 % PSTE Take 1 application by mouth See admin instructions. After regular brushing, flossing and rinsing, brush a pea size of this paste for 2 minutes then spit it out  thoroughly  4  . rosuvastatin (CRESTOR) 40 MG tablet Take 40 mg by mouth at bedtime.     . sitaGLIPtin-metformin (JANUMET) 50-1000 MG tablet Take 1 tablet by mouth 2 (two) times daily with a meal.    . traMADol (ULTRAM) 50 MG tablet Take 50 mg by mouth daily.      No current facility-administered medications on file prior to visit.     Allergies  Allergen Reactions  . Penicillins Other (See Comments)    PATIENT HAS HAD A PCN REACTION WITH IMMEDIATE RASH, FACIAL/TONGUE/THROAT SWELLING, SOB, OR LIGHTHEADEDNESS WITH HYPOTENSION:  #  #  YES  #  #  Has patient had a PCN reaction causing severe rash involving mucus membranes or skin necrosis: No Has patient had a PCN reaction that required hospitalization No Has patient had a PCN reaction occurring within the last 10 years: No   . Other Hives, Itching and Rash    Powered gloves Arthropod Insect  . Seasonal Ic [Cholestatin] Other (See Comments)    Sneezing, runny nose    Objective: Vascular Examination: Capillary refill time immediate x 10 digits.  Dorsalis pedis pulses 2/4 b/l.  Posterior tibial pulses 1/4 b/l.  Digital hair absent b/l.  Skin temperature gradient WNL b/l.  Dermatological Examination: Skin with normal turgor, texture and tone b/l.  Toenails 1-5 b/l discolored, thick, dystrophic with subungual debris and pain with palpation to nailbeds due to thickness of nails.  Hyperkeratotic lesion b/l hallux, submet head 1 left foot, submet head 2 right foot. No erythema, no edema, no drainage, no flocculence noted.   Preulcerative corn left 2nd digit with hyperkeratotic lesion and visible subdermal hemorrhage. Predebridement measurements 1.0 cm in diameter. Postdebridement, reveals partial thickness ulcer. No erythema, no edema, no purulence, no flocculence, no abnormal warmth or coolness of digit. No ischemia or gangrene. Postdebridement, measures 1.0 cm in diameter with depth of 0.1 cm.   Musculoskeletal: Muscle strength  5/5 to all LE muscle groups.  Hammertoes 2-5 b/l.  Neurological: Sensation decreased b/l with 10 gram monofilament.  Vibratory sensation diminished b/l.  Assessment: 1. Painful onychomycosis toenails 1-5 b/l 2.   Partial thickness ulcer distal tip left 2nd digit 3.   Calluses b/l hallux, submet head 1 left foot, submet head 2 right foot 4.   NIDDM with neuropathy  Plan: 1. Continue diabetic foot care principles. Literature dispensed on today. 2. Toenails 1-5 b/l were debrided in length and girth without iatrogenic bleeding. Hyperkeratotic lesion(s) pared b/l hallux, submet head 1 left foot, submet head 2 right foot with  sterile scalpel blade without incident. Ulcer was debrided to level of viable tissue. Ulcer was cleansed with wound cleanser. Triple antibiotic ointment was applied to base of wound with light dressing. She is to apply antibiotic ointment to digit once daily. Dispensed Silipos toe cap for daily use. Patient was given instructions on offloading and dressing change/aftercare and was instructed to call immediately if any signs or symptoms of infection arise.  Patient is to follow up one month. Patient instructed to report to emergency department with worsening appearance of ulcer/toe/foot, increased pain, foul odor, increased redness, swelling, drainage, fever, chills, nightsweats, nausea, vomiting, increased blood sugar.  Patient/POA related understanding. Patient to continue soft, supportive shoe gear daily. Patient to report any pedal injuries to medical professional immediately. Patient/POA to call should there be a concern in the interim.

## 2019-08-14 ENCOUNTER — Other Ambulatory Visit: Payer: Self-pay

## 2019-08-14 ENCOUNTER — Encounter: Payer: Self-pay | Admitting: Podiatry

## 2019-08-14 ENCOUNTER — Ambulatory Visit (INDEPENDENT_AMBULATORY_CARE_PROVIDER_SITE_OTHER): Payer: Medicaid Other | Admitting: Podiatry

## 2019-08-14 DIAGNOSIS — E11621 Type 2 diabetes mellitus with foot ulcer: Secondary | ICD-10-CM | POA: Diagnosis not present

## 2019-08-14 DIAGNOSIS — E1142 Type 2 diabetes mellitus with diabetic polyneuropathy: Secondary | ICD-10-CM | POA: Diagnosis not present

## 2019-08-14 DIAGNOSIS — L97521 Non-pressure chronic ulcer of other part of left foot limited to breakdown of skin: Secondary | ICD-10-CM

## 2019-08-14 NOTE — Patient Instructions (Signed)
Diabetes Mellitus and Foot Care Foot care is an important part of your health, especially when you have diabetes. Diabetes may cause you to have problems because of poor blood flow (circulation) to your feet and legs, which can cause your skin to:  Become thinner and drier.  Break more easily.  Heal more slowly.  Peel and crack. You may also have nerve damage (neuropathy) in your legs and feet, causing decreased feeling in them. This means that you may not notice minor injuries to your feet that could lead to more serious problems. Noticing and addressing any potential problems early is the best way to prevent future foot problems. How to care for your feet Foot hygiene  Wash your feet daily with warm water and mild soap. Do not use hot water. Then, pat your feet and the areas between your toes until they are completely dry. Do not soak your feet as this can dry your skin.  Trim your toenails straight across. Do not dig under them or around the cuticle. File the edges of your nails with an emery board or nail file.  Apply a moisturizing lotion or petroleum jelly to the skin on your feet and to dry, brittle toenails. Use lotion that does not contain alcohol and is unscented. Do not apply lotion between your toes. Shoes and socks  Wear clean socks or stockings every day. Make sure they are not too tight. Do not wear knee-high stockings since they may decrease blood flow to your legs.  Wear shoes that fit properly and have enough cushioning. Always look in your shoes before you put them on to be sure there are no objects inside.  To break in new shoes, wear them for just a few hours a day. This prevents injuries on your feet. Wounds, scrapes, corns, and calluses  Check your feet daily for blisters, cuts, bruises, sores, and redness. If you cannot see the bottom of your feet, use a mirror or ask someone for help.  Do not cut corns or calluses or try to remove them with medicine.  If you  find a minor scrape, cut, or break in the skin on your feet, keep it and the skin around it clean and dry. You may clean these areas with mild soap and water. Do not clean the area with peroxide, alcohol, or iodine.  If you have a wound, scrape, corn, or callus on your foot, look at it several times a day to make sure it is healing and not infected. Check for: ? Redness, swelling, or pain. ? Fluid or blood. ? Warmth. ? Pus or a bad smell. General instructions  Do not cross your legs. This may decrease blood flow to your feet.  Do not use heating pads or hot water bottles on your feet. They may burn your skin. If you have lost feeling in your feet or legs, you may not know this is happening until it is too late.  Protect your feet from hot and cold by wearing shoes, such as at the beach or on hot pavement.  Schedule a complete foot exam at least once a year (annually) or more often if you have foot problems. If you have foot problems, report any cuts, sores, or bruises to your health care provider immediately. Contact a health care provider if:  You have a medical condition that increases your risk of infection and you have any cuts, sores, or bruises on your feet.  You have an injury that is not   healing.  You have redness on your legs or feet.  You feel burning or tingling in your legs or feet.  You have pain or cramps in your legs and feet.  Your legs or feet are numb.  Your feet always feel cold.  You have pain around a toenail. Get help right away if:  You have a wound, scrape, corn, or callus on your foot and: ? You have pain, swelling, or redness that gets worse. ? You have fluid or blood coming from the wound, scrape, corn, or callus. ? Your wound, scrape, corn, or callus feels warm to the touch. ? You have pus or a bad smell coming from the wound, scrape, corn, or callus. ? You have a fever. ? You have a red line going up your leg. Summary  Check your feet every day  for cuts, sores, red spots, swelling, and blisters.  Moisturize feet and legs daily.  Wear shoes that fit properly and have enough cushioning.  If you have foot problems, report any cuts, sores, or bruises to your health care provider immediately.  Schedule a complete foot exam at least once a year (annually) or more often if you have foot problems. This information is not intended to replace advice given to you by your health care provider. Make sure you discuss any questions you have with your health care provider. Document Released: 09/25/2000 Document Revised: 11/10/2017 Document Reviewed: 10/30/2016 Elsevier Patient Education  2020 [employer]  

## 2019-08-14 NOTE — Progress Notes (Signed)
Subjective:   [name] presents for continued care of ulceration of left 2nd digit.  Patient has been performing daily dressing changes to left foot daily utilizing Neosporin Cream. Pt. denies any new complaints.  Patient denies any fever, chills, nightsweats, nausea or vomiting.  She states she feels the toe tunnel is helping with keeping the digit a little straighter.  Her mother is present during the visit.  Current Outpatient Medications on File Prior to Visit  Medication Sig Dispense Refill  . ACCU-CHEK AVIVA PLUS test strip use 1 test strips 2 times daily  5  . acetaminophen (TYLENOL) 500 MG tablet Take 1,000 mg by mouth every 6 (six) hours as needed for mild pain or headache.    Marland Kitchen acetaminophen-codeine (TYLENOL #3) 300-30 MG tablet Take 1 tablet by mouth every 6 (six) hours as needed for moderate pain. 30 tablet 0  . albuterol (PROVENTIL HFA;VENTOLIN HFA) 108 (90 Base) MCG/ACT inhaler Inhale 2 puffs into the lungs every 6 (six) hours as needed for wheezing or shortness of breath.     . ALLERGY 10 MG tablet Take 20 mg by mouth daily.   2  . amitriptyline (ELAVIL) 100 MG tablet Take 100 mg by mouth at bedtime.    . benazepril (LOTENSIN) 20 MG tablet Take 30 mg by mouth daily.    . bisacodyl (DULCOLAX) 5 MG EC tablet Take 5 mg by mouth 2 (two) times daily.     . Blood Glucose Monitoring Suppl (ACCU-CHEK AVIVA PLUS) w/Device KIT USE TO CHECK BLOOD SUGAR  0  . bumetanide (BUMEX) 0.5 MG tablet Take 1 mg by mouth 2 (two) times daily.     . Cholecalciferol 25 MCG (1000 UT) capsule Take 1,000 Units by mouth 2 (two) times daily.    . ciclopirox (LOPROX) 0.77 % cream Apply 1 application topically 2 (two) times daily as needed (itching).     . clobetasol cream (TEMOVATE) 0.05 % Apply topically.    Marland Kitchen CLOBETASOL PROPIONATE E 0.05 % emollient cream Apply 1 application topically 2 (two) times daily as needed (itching).   0  . DULoxetine (CYMBALTA) 60 MG capsule Take 1 capsule (60 mg total)  by mouth daily. (Patient taking differently: Take 60 mg by mouth at bedtime. ) 30 capsule 12  . Exenatide ER (BYDUREON) 2 MG PEN Inject 1 Dose into the skin once a week. (Patient taking differently: Inject 2 mg into the skin once a week. On Sundays)    . FERREX 150 150 MG capsule Take 150 mg by mouth 2 (two) times daily.   2  . hydrocortisone cream 1 % Apply to affected area 2 times daily (Patient taking differently: Apply 1 application topically daily as needed for itching (bites). Apply to affected area 2 times daily) 15 g 0  . hydrOXYzine (ATARAX/VISTARIL) 25 MG tablet Take 25 mg by mouth at bedtime.    . insulin aspart (NOVOLOG) 100 UNIT/ML injection Inject 2-15 Units into the skin 2 (two) times daily before a meal. Per sliding scale for BS is 121 or greater    . insulin glargine (LANTUS) 100 UNIT/ML injection Inject 0.28 mLs (28 Units total) into the skin at bedtime. (Patient taking differently: Inject 38 Units into the skin at bedtime. ) 10 mL   . Iron-FA-B Cmp-C-Biot-Probiotic (FUSION PLUS) CAPS Take by mouth.    . lidocaine (LIDODERM) 5 % Place 1 patch onto the skin daily as needed (pain). Remove & Discard patch within 12 hours or as  directed by MD    . Magnesium 250 MG TABS Take 250 mg by mouth 2 (two) times daily.    . meloxicam (MOBIC) 15 MG tablet Take 15 mg by mouth daily as needed for pain.    . methocarbamol (ROBAXIN) 500 MG tablet Take 500 mg by mouth 2 (two) times daily.    . metoprolol succinate (TOPROL-XL) 25 MG 24 hr tablet Take by mouth.    . metoprolol tartrate (LOPRESSOR) 25 MG tablet Take 25 mg by mouth daily with breakfast.     . NOVOFINE PLUS 32G X 4 MM MISC USE 1 PEN NEEDLE 3 TIMES DAILY  6  . Omega-3 Fatty Acids (EQL OMEGA 3 FISH OIL) 1400 MG CAPS Take 1,400 mg by mouth every evening.    . Ondansetron HCl (ZOFRAN PO) Take by mouth.    Marland Kitchen OVER THE COUNTER MEDICATION Take 1 capsule by mouth daily. Active Lean Keto Dietary Supplement    . PREVIDENT 5000 SENSITIVE 1.1-5 %  PSTE Take 1 application by mouth See admin instructions. After regular brushing, flossing and rinsing, brush a pea size of this paste for 2 minutes then spit it out thoroughly  4  . rosuvastatin (CRESTOR) 40 MG tablet Take 40 mg by mouth at bedtime.     . sitaGLIPtin-metformin (JANUMET) 50-1000 MG tablet Take 1 tablet by mouth 2 (two) times daily with a meal.    . traMADol (ULTRAM) 50 MG tablet Take 50 mg by mouth daily.      No current facility-administered medications on file prior to visit.      Allergies  Allergen Reactions  . Penicillins Other (See Comments)    PATIENT HAS HAD A PCN REACTION WITH IMMEDIATE RASH, FACIAL/TONGUE/THROAT SWELLING, SOB, OR LIGHTHEADEDNESS WITH HYPOTENSION:  #  #  YES  #  #  Has patient had a PCN reaction causing severe rash involving mucus membranes or skin necrosis: No Has patient had a PCN reaction that required hospitalization No Has patient had a PCN reaction occurring within the last 10 years: No   . Other Hives, Itching and Rash    Powered gloves Arthropod Insect  . Seasonal Ic [Cholestatin] Other (See Comments)    Sneezing, runny nose    Objective:   Vascular Examination:  Capillary refill time immediate b/l.   Dorsalis pedis pulses palpable b/l.  Posterior tibial pulses faintly palpable b/l.  Digital hair absent b/l.  Skin temperature gradient WNL b/l.  Dermatological Examination: Skin with normal turgor, texture and tone b/l.  Toenails 1-5 b/l recently debrided and adequate length on today's visit.  Ulceration located distal tip left 2nd digit: Predebridement measurements carried out today of 1.0 cm in diameter.  No periulcerative erythema, no edema, no drainage.  Flocculence absent.  Malodor absent.  Postdebridement measurements today are: 1.0 cm in diameter. Ulcer is healed.  Musculoskeletal: Muscle strength 5/5 to all LE muscle groups bilaterally.  Hammertoes 2-5 b/l.  Neurological: Sensation decreased bilaterally with 10  gram monofilament.  Vibratory sensation diminished b/l.  Xrays:  Assessment:   1.  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    NIDDM with peripheral neuropathy  Plan: 1. Ulcer was debrided. Ulcer was cleansed with wound cleanser. Antibiotic ointment was applied to base of wound with light dressing. 2. Continue toe tunnel daily left 2nd digit..  3. Patient is to follow up September 19, 2019 for preventative diabetic foot care. 4. Patient instructed to report to emergency department with worsening appearance of ulcer/toe/foot, increased pain,  foul odor, increased redness, swelling, drainage, fever, chills, nightsweats, nausea, vomiting, increased blood sugar.  5. Patient/POA related understanding.

## 2019-08-15 ENCOUNTER — Telehealth: Payer: Self-pay | Admitting: Adult Health

## 2019-08-15 NOTE — Telephone Encounter (Signed)
I talk with patient regarding moved appt from 12/7 to 12/15

## 2019-09-11 ENCOUNTER — Encounter (HOSPITAL_COMMUNITY): Payer: Self-pay | Admitting: Emergency Medicine

## 2019-09-11 ENCOUNTER — Other Ambulatory Visit: Payer: Self-pay

## 2019-09-11 ENCOUNTER — Emergency Department (HOSPITAL_COMMUNITY): Payer: Medicaid Other

## 2019-09-11 ENCOUNTER — Emergency Department (HOSPITAL_COMMUNITY)
Admission: EM | Admit: 2019-09-11 | Discharge: 2019-09-11 | Disposition: A | Discharge status: Home / Self Care | Payer: Medicaid Other | Attending: Emergency Medicine | Admitting: Emergency Medicine

## 2019-09-11 DIAGNOSIS — I129 Hypertensive chronic kidney disease with stage 1 through stage 4 chronic kidney disease, or unspecified chronic kidney disease: Secondary | ICD-10-CM | POA: Insufficient documentation

## 2019-09-11 DIAGNOSIS — E1122 Type 2 diabetes mellitus with diabetic chronic kidney disease: Secondary | ICD-10-CM | POA: Insufficient documentation

## 2019-09-11 DIAGNOSIS — J029 Acute pharyngitis, unspecified: Secondary | ICD-10-CM | POA: Insufficient documentation

## 2019-09-11 DIAGNOSIS — Z794 Long term (current) use of insulin: Secondary | ICD-10-CM | POA: Diagnosis not present

## 2019-09-11 DIAGNOSIS — Z20828 Contact with and (suspected) exposure to other viral communicable diseases: Secondary | ICD-10-CM | POA: Insufficient documentation

## 2019-09-11 DIAGNOSIS — Z79899 Other long term (current) drug therapy: Secondary | ICD-10-CM | POA: Insufficient documentation

## 2019-09-11 DIAGNOSIS — Z20822 Contact with and (suspected) exposure to covid-19: Secondary | ICD-10-CM

## 2019-09-11 DIAGNOSIS — N189 Chronic kidney disease, unspecified: Secondary | ICD-10-CM | POA: Diagnosis not present

## 2019-09-11 LAB — GROUP A STREP BY PCR: Group A Strep by PCR: NOT DETECTED

## 2019-09-11 MED ORDER — BENZONATATE 100 MG PO CAPS: 100.0000 mg | ORAL_CAPSULE | Freq: Three times a day (TID) | ORAL | 0 refills | Status: DC

## 2019-09-11 MED ORDER — ACETAMINOPHEN 500 MG PO TABS
1000.0000 mg | ORAL_TABLET | Freq: Once | ORAL | Status: AC
  Administered 2019-09-11: 1000 mg via ORAL
  Filled 2019-09-11: qty 2

## 2019-09-11 NOTE — Discharge Instructions (Signed)
All of your tests today were normal. Your outpatient COVID test is still pending. Self-quarantine away from others until your results become available. I am sending you home with some cough medication. Take as prescribed. You may take over the counter Tylenol as needed for fever. Your symptoms may last for a few days. Follow-up with your PCP if your symptoms do not improve within the next week. Return to the ER for new or worsening symptoms.

## 2019-09-11 NOTE — ED Provider Notes (Signed)
[facility] EMERGENCY DEPARTMENT Provider Note   CSN: [id number] Arrival date & time: 09/11/19  1833     History   Chief Complaint Chief Complaint  Patient presents with   possible COVID    HPI Alicia Coffey is a 48 y.o. female with a past medical history significant for anemia, anxiety, breast cancer, depression, diabetes, fibromyalgia, hyperlipidemia, and hypertension who presents to the ED for evaluation of sore throat x 1 day. Patient notes she started experiencing a sore throat last night which has progressively gotten worse throughout the day.  Sore throat is associated with chills, fever, and lightheadedness.  Patient denies Covid exposure or sick contacts.  Patient admits to abdominal pain yesterday which has since resolved.  Patient has chronic shortness of breath which has worsened over the past day.  She notes she "feels like she is catching a cold". T.max 101.4 earlier today. Patient has tried benadryl with mild relief. Patient denies chest pain, nausea, vomiting, and lower extremity edema.   Past Medical History:  Diagnosis Date   Anemia    Anxiety    Arthritis    right knee   Cancer (HCC)    calcifications in R breast - biopsy, took tamoxifen - completed 2018   Chronic kidney disease    Depression    Diabetes mellitus     Type 2   Disc degeneration, lumbar    Fibromyalgia    Hyperlipidemia    Hypertension    under control, has been on med. > 1 yr.   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   Idiopathic peripheral neuropathy 09/21/2016   Obesity (BMI 35.0-39.9 without comorbidity) 09/21/2016   Obstructive sleep apnea of adult 09/21/2016   Oropharyngeal dysphagia 09/21/2016   Vertigo 09/21/2016   Menorrhagia with irregular cycle 07/25/2014   Iron deficiency anemia due to chronic blood loss 07/25/2014   Follow up 05/23/2014   Fibroid, uterine 04/04/2014   Abnormal uterine bleeding (AUB) 03/26/2014   Diabetic peripheral neuropathy ([place]) 12/21/2013   Low back pain 12/11/2013   Pain in joint, shoulder region 12/11/2013   Pain in limb 12/11/2013   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   Diabetes ([place]) 07/28/2012   Hypertension 07/28/2012   Hypercholesteremia 07/28/2012   Atypical lobular hyperplasia of breast 07/13/2012    Past Surgical History:  Procedure Laterality Date   ABLATION     uterine    BREAST BIOPSY Left 2014   benign   BREAST BIOPSY Right 2013   high risk   BREAST LUMPECTOMY WITH NEEDLE LOCALIZATION  08/15/2012   Procedure: BREAST LUMPECTOMY WITH NEEDLE LOCALIZATION;  Surgeon: Haywood Lasso, MD;  Location: [place];  Service: General;  Laterality: Right;  Wire localizations Right breast calcifications   BREAST SURGERY  CARPAL TUNNEL RELEASE Right 11/24/2017   Procedure: RIGHT CARPAL TUNNEL RELEASE;  Surgeon: Ashok Pall, MD;  Location: [place];  Service: Neurosurgery;  Laterality: Right;  Right CARPAL TUNNEL RELEASE   CARPAL TUNNEL RELEASE Left 08/31/2018   Procedure: LEFT CARPAL TUNNEL RELEASE;  Surgeon: Ashok Pall, MD;  Location: [place];  Service: Neurosurgery;  Laterality: Left;   DILITATION & CURRETTAGE/HYSTROSCOPY WITH THERMACHOICE ABLATION N/A 08/21/2014   Procedure: DILATATION & CURETTAGE/HYSTEROSCOPY WITH THERMACHOICE ABLATION;  Surgeon: Jonnie Kind, MD;  Location: [facility];   Service: Gynecology;  Laterality: N/A;   KNEE ARTHROSCOPY Left 10/16/2015   Procedure: ARTHROSCOPY KNEE with debridment;  Surgeon: Frederik Pear, MD;  Location: [place];  Service: Orthopedics;  Laterality: Left;   KNEE ARTHROSCOPY WITH MEDIAL MENISECTOMY Right 07/17/2015   Procedure: KNEE ARTHROSCOPY WITH MEDIAL MENISECTOMY;  Surgeon: Frederik Pear, MD;  Location: [place];  Service: Orthopedics;  Laterality: Right;   POLYPECTOMY N/A 08/21/2014   Procedure: ENDOMETRIAL POLYPECTOMY;  Surgeon: Jonnie Kind, MD;  Location: [facility];  Service: Gynecology;  Laterality: N/A;   REFRACTIVE SURGERY Right    micro aneuysms   ULNAR NERVE TRANSPOSITION Right 11/24/2017   Procedure: RIGHT ULNAR NERVE DECOMPRESSION/TRANSPOSITION;  Surgeon: Ashok Pall, MD;  Location: [place];  Service: Neurosurgery;  Laterality: Right;  Right ULNAR NERVE DECOMPRESSION/TRANSPOSITION     OB History    Gravida  0   Para      Term      Preterm      AB      Living        SAB      TAB      Ectopic      Multiple      Live Births               Home Medications    Prior to Admission medications   Medication Sig Start Date End Date Taking? Authorizing Provider  ACCU-CHEK AVIVA PLUS test strip use 1 test strips 2 times daily 01/31/18   [provider]  acetaminophen (TYLENOL) 500 MG tablet Take 1,000 mg by mouth every 6 (six) hours as needed for mild pain or headache.    [provider]  acetaminophen-codeine (TYLENOL #3) 300-30 MG tablet Take 1 tablet by mouth every 6 (six) hours as needed for moderate pain. 08/31/18   Ashok Pall, MD  albuterol (PROVENTIL HFA;VENTOLIN HFA) 108 (90 Base) MCG/ACT inhaler Inhale 2 puffs into the lungs every 6 (six) hours as needed for wheezing or shortness of breath.     [provider]  ALLERGY 10 MG tablet Take 20 mg by mouth daily.  05/26/18   [provider]  amitriptyline (ELAVIL) 100 MG tablet Take  100 mg by mouth at bedtime.    [provider]  benazepril (LOTENSIN) 20 MG tablet Take 30 mg by mouth daily.    [provider]  benzonatate (TESSALON) 100 MG capsule Take 1 capsule (100 mg total) by mouth every 8 (eight) hours. 09/11/19   Cheek, Comer Locket, PA-C  bisacodyl (DULCOLAX) 5 MG EC tablet Take 5 mg by mouth 2 (two) times daily.     [provider]  Blood Glucose Monitoring Suppl (ACCU-CHEK AVIVA PLUS) w/Device KIT USE TO CHECK BLOOD SUGAR 01/25/18   [provider]  bumetanide (BUMEX) 0.5 MG tablet Take 1 mg by mouth 2 (two) times daily.     [provider]  Cholecalciferol 25 MCG (1000 UT) capsule  Take 1,000 Units by mouth 2 (two) times daily.    [provider]  ciclopirox (LOPROX) 0.77 % cream Apply 1 application topically 2 (two) times daily as needed (itching).     [provider]  clobetasol cream (TEMOVATE) 0.05 % Apply topically.    [provider]  CLOBETASOL PROPIONATE E 0.05 % emollient cream Apply 1 application topically 2 (two) times daily as needed (itching).  12/09/17   [provider]  DULoxetine (CYMBALTA) 60 MG capsule Take 1 capsule (60 mg total) by mouth daily. Patient taking differently: Take 60 mg by mouth at bedtime.  12/14/13   Marcial Pacas, MD  Exenatide ER (BYDUREON) 2 MG PEN Inject 1 Dose into the skin once a week. Patient taking differently: Inject 2 mg into the skin once a week. On Sundays 09/16/17   Nicholas Lose, MD  FERREX 150 150 MG capsule Take 150 mg by mouth 2 (two) times daily.  05/24/18   [provider]  hydrocortisone cream 1 % Apply to affected area 2 times daily Patient taking differently: Apply 1 application topically daily as needed for itching (bites). Apply to affected area 2 times daily 08/12/15   Gloriann Loan, PA-C  hydrOXYzine (ATARAX/VISTARIL) 25 MG tablet Take 25 mg by mouth at bedtime.    [provider]  insulin aspart (NOVOLOG) 100 UNIT/ML  injection Inject 2-15 Units into the skin 2 (two) times daily before a meal. Per sliding scale for BS is 121 or greater    [provider]  insulin glargine (LANTUS) 100 UNIT/ML injection Inject 0.28 mLs (28 Units total) into the skin at bedtime. Patient taking differently: Inject 38 Units into the skin at bedtime.  09/17/16   Nicholas Lose, MD  Iron-FA-B Cmp-C-Biot-Probiotic (FUSION PLUS) CAPS Take by mouth.    [provider]  lidocaine (LIDODERM) 5 % Place 1 patch onto the skin daily as needed (pain). Remove & Discard patch within 12 hours or as directed by MD    [provider]  Magnesium 250 MG TABS Take 250 mg by mouth 2 (two) times daily.    [provider]  meloxicam (MOBIC) 15 MG tablet Take 15 mg by mouth daily as needed for pain.    [provider]  methocarbamol (ROBAXIN) 500 MG tablet Take 500 mg by mouth 2 (two) times daily.    [provider]  metoprolol succinate (TOPROL-XL) 25 MG 24 hr tablet Take by mouth.    [provider]  metoprolol tartrate (LOPRESSOR) 25 MG tablet Take 25 mg by mouth daily with breakfast.     [provider]  NOVOFINE PLUS 32G X 4 MM MISC USE 1 PEN NEEDLE 3 TIMES DAILY 12/01/17   [provider]  Omega-3 Fatty Acids (EQL OMEGA 3 FISH OIL) 1400 MG CAPS Take 1,400 mg by mouth every evening.    [provider]  Ondansetron HCl (ZOFRAN PO) Take by mouth.    [provider]  OVER THE COUNTER MEDICATION Take 1 capsule by mouth daily. Active Lean Keto Dietary Supplement    [provider]  PREVIDENT 5000 SENSITIVE 1.1-5 % PSTE Take 1 application by mouth See admin instructions. After regular brushing, flossing and rinsing, brush a pea size of this paste for 2 minutes then spit it out thoroughly 05/03/18   [provider]  rosuvastatin (CRESTOR) 40 MG tablet Take 40 mg by mouth at bedtime.     [provider]  sitaGLIPtin-metformin (JANUMET)  50-1000 MG tablet  Take 1 tablet by mouth 2 (two) times daily with a meal.    [provider]  traMADol (ULTRAM) 50 MG tablet Take 50 mg by mouth daily.     [provider]    Family History Family History  Problem Relation Age of Onset   Breast cancer Paternal Grandmother 41       breast; dbl mastectomy   Diabetes Father    Heart disease Father    Hypertension Father    Heart attack Father    Diabetes Mother    Heart disease Mother    Hypertension Mother    Diabetes Sister    Fibromyalgia Sister    Diabetes Sister    Heart attack Maternal Grandmother        <35   Heart attack Maternal Grandfather    Heart attack Paternal Grandfather    Diabetes Brother     Social History Social History   Tobacco Use   Smoking status: Never Smoker   Smokeless tobacco: Never Used  Substance Use Topics   Alcohol use: No   Drug use: No     Allergies   Penicillins, Other, and Seasonal ic [cholestatin]   Review of Systems Review of Systems  Constitutional: Positive for appetite change, chills and fever.  HENT: Positive for sore throat. Negative for congestion, rhinorrhea, trouble swallowing and voice change.   Respiratory: Positive for cough and shortness of breath (chronic).   Cardiovascular: Negative for chest pain and leg swelling.  Gastrointestinal: Positive for abdominal pain (resolved) and diarrhea (loose stool today). Negative for nausea and vomiting.  Genitourinary: Negative for dysuria.  All other systems reviewed and are negative.    Physical Exam Updated Vital Signs BP (!) 133/92 (BP Location: Left Arm)    Pulse (!) 108    Temp (!) 101.1 F (38.4 C) (Oral)    Resp 18    Ht 5' 3.5" (1.613 m)    Wt 114.8 kg    LMP 09/14/2018    SpO2 97%    BMI 44.11 kg/m   Physical Exam Vitals signs and nursing note reviewed.  Constitutional:      General: She is not in acute distress.    Appearance: She is not toxic-appearing.  HENT:     Head:  Normocephalic.     Mouth/Throat:     Comments: Posterior oropharynx clear and mucous membranes moist, there is mild erythema but no edema or tonsillar exudates, uvula midline, normal phonation, no trismus, tolerating secretions without difficulty.  Eyes:     Pupils: Pupils are equal, round, and reactive to light.  Neck:     Musculoskeletal: Neck supple.  Cardiovascular:     Rate and Rhythm: Regular rhythm. Tachycardia present.     Pulses: Normal pulses.     Heart sounds: Normal heart sounds. No murmur. No friction rub. No gallop.   Pulmonary:     Effort: Pulmonary effort is normal.     Breath sounds: Normal breath sounds.     Comments: Respirations equal and unlabored, patient able to speak in full sentences, lungs clear to auscultation bilaterally Abdominal:     General: Abdomen is flat. Bowel sounds are normal. There is no distension.     Palpations: Abdomen is soft.     Tenderness: There is no abdominal tenderness. There is no right CVA tenderness, left CVA tenderness, guarding or rebound.  Musculoskeletal:     Comments: 1+ pitting edema bilaterally. Negative Homans sign bilaterally. Distal sensation and pulses intact.  Skin:    General: Skin is warm and dry.     Capillary Refill: Capillary refill takes less than 2 seconds.  Neurological:     General: No focal deficit present.     Mental Status: She is alert.      ED Treatments / Results  Labs (all labs ordered are listed, but only abnormal results are displayed) Labs Reviewed  GROUP A STREP BY PCR  SARS CORONAVIRUS 2 (TAT 6-24 HRS)  POC SARS CORONAVIRUS 2 AG -  ED    EKG None  Radiology Dg Chest Portable 1 View  Result Date: 09/11/2019 CLINICAL DATA:  Fever, cough, shortness of breath EXAM: PORTABLE CHEST 1 VIEW COMPARISON:  10/04/2017 FINDINGS: Heart and mediastinal contours are within normal limits. No focal opacities or effusions. No acute bony abnormality. IMPRESSION: No active disease. Electronically Signed    By: Rolm Baptise M.D.   On: 09/11/2019 21:43    Procedures Procedures (including critical care time)  Medications Ordered in ED Medications  acetaminophen (TYLENOL) tablet 1,000 mg (1,000 mg Oral Given 09/11/19 2100)     Initial Impression / Assessment and Plan / ED Course  I have reviewed the triage vital signs and the nursing notes.  Pertinent labs & imaging results that were available during my care of the patient were reviewed by me and considered in my medical decision making (see chart for details).       48 year old female presents to the ED for evaluation of sore throat, fever, headache, and chills.  Patient is afebrile at 101.1, tachycardic at 108, and maintaining O2 saturations at 97% on room air. Patient in no acute distress and non-ill appearing. Lungs clear to auscultation bilaterally. 1+ pitting edema bilaterally. Throat with mild erythema, but no exudates or tonsillar edema. Presentation non concerning for PTA or RPA. No trismus or uvula deviation. Pt does not appear dehydrated, but did discuss importance of water rehydration. Negative strep. CXR personally reviewed without signs of infection. Rapid COVID test negative. Will send for PCR COVID test. Patient ambulated and maintained O2 saturation at 95-96%. Suspect symptoms could be related to COVID infection vs. Another viral infection. No antibiotics indicated at this time. Will treat symptomatically with cough medication. Patient advised to self-quarantine away from others until PCR results become available. Patient instructed to follow-up with PCP if symptoms do not improve within the next week. Strict ED precautions discussed with patient. Patient states understanding and agrees to plan. Patient discharged home in no acute distress and stable vitals    Raelan Burgoon Morss was evaluated in Emergency Department on 09/12/2019 for the symptoms described in the history of present illness. She was evaluated in the context of the global  COVID-19 pandemic, which necessitated consideration that the patient might be at risk for infection with the SARS-CoV-2 virus that causes COVID-19. Institutional protocols and algorithms that pertain to the evaluation of patients at risk for COVID-19 are in a state of rapid change based on information released by regulatory bodies including the CDC and federal and state organizations. These policies and algorithms were followed during the patient's care in the ED.   Final Clinical Impressions(s) / ED Diagnoses   Final diagnoses:  Suspected COVID-19 virus infection    ED Discharge Orders         Ordered    benzonatate (TESSALON) 100 MG capsule  Every 8 hours     09/11/19 2228           Cheek, Comer Locket,  PA-C 09/12/19 0230    Varney Biles, MD 09/14/19 [phone]

## 2019-09-11 NOTE — ED Triage Notes (Signed)
Pt c/o fever, sore throat, headache, chills, fatigue since yesterday; denies any known COVID exposure and denies travel

## 2019-09-11 NOTE — ED Notes (Signed)
Pt ambulated in room   She has been covered with a blanket   Pulse ox 95-96 on RA while ambulating

## 2019-09-12 LAB — SARS CORONAVIRUS 2 (TAT 6-24 HRS): SARS Coronavirus 2: NEGATIVE

## 2019-09-18 ENCOUNTER — Encounter: Payer: Medicaid Other | Admitting: Adult Health

## 2019-09-19 ENCOUNTER — Ambulatory Visit: Payer: Medicaid Other | Admitting: Podiatry

## 2019-09-19 ENCOUNTER — Other Ambulatory Visit: Payer: Self-pay

## 2019-09-19 DIAGNOSIS — B351 Tinea unguium: Secondary | ICD-10-CM

## 2019-09-19 DIAGNOSIS — E1142 Type 2 diabetes mellitus with diabetic polyneuropathy: Secondary | ICD-10-CM

## 2019-09-19 DIAGNOSIS — M79676 Pain in unspecified toe(s): Secondary | ICD-10-CM | POA: Diagnosis not present

## 2019-09-19 DIAGNOSIS — M79609 Pain in unspecified limb: Secondary | ICD-10-CM

## 2019-09-19 DIAGNOSIS — L84 Corns and callosities: Secondary | ICD-10-CM

## 2019-09-19 NOTE — Patient Instructions (Signed)
Diabetes Mellitus and Foot Care Foot care is an important part of your health, especially when you have diabetes. Diabetes may cause you to have problems because of poor blood flow (circulation) to your feet and legs, which can cause your skin to:  Become thinner and drier.  Break more easily.  Heal more slowly.  Peel and crack. You may also have nerve damage (neuropathy) in your legs and feet, causing decreased feeling in them. This means that you may not notice minor injuries to your feet that could lead to more serious problems. Noticing and addressing any potential problems early is the best way to prevent future foot problems. How to care for your feet Foot hygiene  Wash your feet daily with warm water and mild soap. Do not use hot water. Then, pat your feet and the areas between your toes until they are completely dry. Do not soak your feet as this can dry your skin.  Trim your toenails straight across. Do not dig under them or around the cuticle. File the edges of your nails with an emery board or nail file.  Apply a moisturizing lotion or petroleum jelly to the skin on your feet and to dry, brittle toenails. Use lotion that does not contain alcohol and is unscented. Do not apply lotion between your toes. Shoes and socks  Wear clean socks or stockings every day. Make sure they are not too tight. Do not wear knee-high stockings since they may decrease blood flow to your legs.  Wear shoes that fit properly and have enough cushioning. Always look in your shoes before you put them on to be sure there are no objects inside.  To break in new shoes, wear them for just a few hours a day. This prevents injuries on your feet. Wounds, scrapes, corns, and calluses  Check your feet daily for blisters, cuts, bruises, sores, and redness. If you cannot see the bottom of your feet, use a mirror or ask someone for help.  Do not cut corns or calluses or try to remove them with medicine.  If you  find a minor scrape, cut, or break in the skin on your feet, keep it and the skin around it clean and dry. You may clean these areas with mild soap and water. Do not clean the area with peroxide, alcohol, or iodine.  If you have a wound, scrape, corn, or callus on your foot, look at it several times a day to make sure it is healing and not infected. Check for: ? Redness, swelling, or pain. ? Fluid or blood. ? Warmth. ? Pus or a bad smell. General instructions  Do not cross your legs. This may decrease blood flow to your feet.  Do not use heating pads or hot water bottles on your feet. They may burn your skin. If you have lost feeling in your feet or legs, you may not know this is happening until it is too late.  Protect your feet from hot and cold by wearing shoes, such as at the beach or on hot pavement.  Schedule a complete foot exam at least once a year (annually) or more often if you have foot problems. If you have foot problems, report any cuts, sores, or bruises to your health care provider immediately. Contact a health care provider if:  You have a medical condition that increases your risk of infection and you have any cuts, sores, or bruises on your feet.  You have an injury that is not   healing.  You have redness on your legs or feet.  You feel burning or tingling in your legs or feet.  You have pain or cramps in your legs and feet.  Your legs or feet are numb.  Your feet always feel cold.  You have pain around a toenail. Get help right away if:  You have a wound, scrape, corn, or callus on your foot and: ? You have pain, swelling, or redness that gets worse. ? You have fluid or blood coming from the wound, scrape, corn, or callus. ? Your wound, scrape, corn, or callus feels warm to the touch. ? You have pus or a bad smell coming from the wound, scrape, corn, or callus. ? You have a fever. ? You have a red line going up your leg. Summary  Check your feet every day  for cuts, sores, red spots, swelling, and blisters.  Moisturize feet and legs daily.  Wear shoes that fit properly and have enough cushioning.  If you have foot problems, report any cuts, sores, or bruises to your health care provider immediately.  Schedule a complete foot exam at least once a year (annually) or more often if you have foot problems. This information is not intended to replace advice given to you by your health care provider. Make sure you discuss any questions you have with your health care provider. Document Released: 09/25/2000 Document Revised: 11/10/2017 Document Reviewed: 10/30/2016 Elsevier Patient Education  2020 [employer]  

## 2019-09-24 ENCOUNTER — Encounter: Payer: Self-pay | Admitting: Podiatry

## 2019-09-24 NOTE — Progress Notes (Signed)
Subjective: Alicia Coffey is seen today for preventative diabetic foot care follow up. She has h/o ulceration distal tip of left 2nd digit which has healed. She is followed routinely for painful, elongated, thickened toenails bilateral feet that she cannot cut. Pain interferes with daily activities. Aggravating factor includes wearing enclosed shoe gear and relieved with periodic debridement.  Medications reviewed in chart.  She denies any new episodes of trauma since her last visit.   Allergies  Allergen Reactions  . Penicillins Other (See Comments)    PATIENT HAS HAD A PCN REACTION WITH IMMEDIATE RASH, FACIAL/TONGUE/THROAT SWELLING, SOB, OR LIGHTHEADEDNESS WITH HYPOTENSION:  #  #  YES  #  #  Has patient had a PCN reaction causing severe rash involving mucus membranes or skin necrosis: No Has patient had a PCN reaction that required hospitalization No Has patient had a PCN reaction occurring within the last 10 years: No   . Other Hives, Itching and Rash    Powered gloves Arthropod Insect  . Seasonal Ic [Cholestatin] Other (See Comments)    Sneezing, runny nose      Objective:  Vascular Examination: Capillary refill time to digits immediate b/l.  Dorsalis pedis present b/l.  Posterior tibial pulses are faintly palpable b/l.   Digital hair absent b/l.   Skin temperature gradient WNL b/l.   Dermatological Examination: Skin with normal turgor, texture and tone b/l.  Toenails 1-5 b/l discolored, thick, dystrophic with subungual debris and pain with palpation to nailbeds due to thickness of nails.  Incurvated nailplate right great toe medial border with tenderness to palpation and nail border hypertrophy. No erythema, no edema, no drainage noted.  Hyperkeratotic lesion distal tip left 2nd digit. No erythema, no edema, no drainage, no flocculence noted.  Musculoskeletal: Muscle strength 5/5 to all LE muscle groups b/l.  Hammertoes digits 2-5 b/l.  No pain, crepitus or  joint limitation noted with ROM.   Neurological Examination: Protective sensation decreased with 10 gram monofilament bilaterally..  Vibratory sensation diminished bilaterally.   Assessment: Painful onychomycosis toenails 1-5 b/l  Corn left 2nd digit with h/o ulceration Ingrown toenail right hallux, noninfected NIDDM with neuropathy  Plan: 1. Toenails 1-5 b/l were debrided in length and girth without iatrogenic bleeding. Offending nail border debrided and curretaged right hallux. Border cleansed with alcohol and triple antibiotic applied. Alicia Coffey was instructed to apply Neosporin Cream to right great toe once daily for 2 weeks. Call if she has any problems/concerns. 2. As a courtesy, corn left 2nd digit pared utilizing sterile scalpel blade without incident. Continue toe tunnel daily for protection when wearing shoe gear. 3. Patient to continue soft, supportive shoe gear daily. 4. Patient to report any pedal injuries to medical professional immediately. 5. Follow up 10 weeks.  6. Patient/POA to call should there be a concern in the interim.

## 2019-09-25 DIAGNOSIS — M79676 Pain in unspecified toe(s): Secondary | ICD-10-CM

## 2019-09-26 ENCOUNTER — Encounter: Payer: Self-pay | Admitting: Adult Health

## 2019-09-26 ENCOUNTER — Other Ambulatory Visit: Payer: Self-pay

## 2019-09-26 ENCOUNTER — Inpatient Hospital Stay: Payer: Medicaid Other | Attending: Adult Health | Admitting: Adult Health

## 2019-09-26 VITALS — BP 143/64 | HR 88 | Temp 98.3°F | Resp 20 | Ht 63.5 in | Wt 257.2 lb

## 2019-09-26 DIAGNOSIS — E785 Hyperlipidemia, unspecified: Secondary | ICD-10-CM | POA: Insufficient documentation

## 2019-09-26 DIAGNOSIS — Z791 Long term (current) use of non-steroidal anti-inflammatories (NSAID): Secondary | ICD-10-CM | POA: Diagnosis not present

## 2019-09-26 DIAGNOSIS — Z79899 Other long term (current) drug therapy: Secondary | ICD-10-CM | POA: Insufficient documentation

## 2019-09-26 DIAGNOSIS — I129 Hypertensive chronic kidney disease with stage 1 through stage 4 chronic kidney disease, or unspecified chronic kidney disease: Secondary | ICD-10-CM | POA: Diagnosis not present

## 2019-09-26 DIAGNOSIS — E119 Type 2 diabetes mellitus without complications: Secondary | ICD-10-CM | POA: Insufficient documentation

## 2019-09-26 DIAGNOSIS — N189 Chronic kidney disease, unspecified: Secondary | ICD-10-CM | POA: Diagnosis not present

## 2019-09-26 DIAGNOSIS — F329 Major depressive disorder, single episode, unspecified: Secondary | ICD-10-CM | POA: Diagnosis not present

## 2019-09-26 DIAGNOSIS — Z803 Family history of malignant neoplasm of breast: Secondary | ICD-10-CM | POA: Insufficient documentation

## 2019-09-26 DIAGNOSIS — Z86000 Personal history of in-situ neoplasm of breast: Secondary | ICD-10-CM | POA: Insufficient documentation

## 2019-09-26 DIAGNOSIS — F419 Anxiety disorder, unspecified: Secondary | ICD-10-CM | POA: Diagnosis not present

## 2019-09-26 DIAGNOSIS — Z794 Long term (current) use of insulin: Secondary | ICD-10-CM | POA: Insufficient documentation

## 2019-09-26 DIAGNOSIS — D0501 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: Secondary | ICD-10-CM | POA: Diagnosis not present

## 2019-09-26 NOTE — Progress Notes (Signed)
CLINIC:  Survivorship   REASON FOR VISIT:  Routine follow-up for history of LCIS.   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  Overall, she reports feeling quite well.   She has stayed up to date with her PCP.  She has had some difficulty with getting in since her ER visit.  She is up to date with her cancer screenings.    She notes she has been diagnosed with PTSD.  She is says her exercise is limited due to her fibromyalgia, neuropathy, vertigo, and any activity makes her winded.    Since her last visit she underwent digital screening bilateral mammogram with tomo and CAD 04/28/2019 that showed no evidence of malignancy and breast density category C.   Alicia Coffey was recently in the ER on 11/30 for fever and sore throat.  She was covid tested and it was negative.  Strep was negative and chest xray was clear.  She stayed in regardless and is feeling better now.      Her tyrer cuzick score rates her lifetime risk of breast cancer at 60%, and that is decreased by about 1/2 with her having taken Tamoxifen for 5 years.     REVIEW OF SYSTEMS:  Review of Systems  Constitutional: Negative for appetite change, chills, fatigue, fever and unexpected weight change.  HENT:   Negative for hearing loss, lump/mass and mouth sores.   Eyes: Negative for eye problems and icterus.  Respiratory: Negative for chest tightness, cough and shortness of breath.   Cardiovascular: Negative for chest pain, leg swelling and palpitations.  Gastrointestinal: Negative for abdominal distention, abdominal pain, constipation, diarrhea, nausea and vomiting.  Endocrine: Negative for hot flashes.  Musculoskeletal: Negative for arthralgias.  Skin: Negative for itching and rash.  Neurological:  Negative for dizziness, extremity weakness, headaches and numbness.  Hematological: Negative for adenopathy. Does not bruise/bleed easily.  Psychiatric/Behavioral: Negative for depression. The patient is not nervous/anxious.         PAST MEDICAL/SURGICAL HISTORY:  Past Medical History:  Diagnosis Date  . Anemia   . Anxiety   . Arthritis    right knee  . Cancer ([place])    calcifications in R breast - biopsy, took tamoxifen - completed 2018  . Chronic kidney disease   . Depression   . Diabetes mellitus     Type 2  . Disc degeneration, lumbar   . Fibromyalgia   . Hyperlipidemia   . Hypertension    under control, has been on med. > 1 yr.  Alicia Coffey body movements    while awake and asleep  . 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 Obstructive sleep apnea of adult 09/21/2016  . Proteinuria    [facility] in [place] following pt.   . Shortness of breath dyspnea    with exertion  . Vertigo 2015  . Wears dentures    upper  . Wears partial dentures    lower   Past Surgical History:  Procedure Laterality Date  . ABLATION     uterine   . BREAST BIOPSY Left 2014   benign  . BREAST BIOPSY Right 2013   high risk  . BREAST LUMPECTOMY WITH NEEDLE LOCALIZATION  08/15/2012   Procedure:  BREAST LUMPECTOMY WITH NEEDLE LOCALIZATION;  Surgeon: Haywood Lasso, MD;  Location: [facility];  Service: General;  Laterality: Right;  Wire localizations Right breast calcifications  . BREAST SURGERY    . CARPAL TUNNEL RELEASE Right 11/24/2017   Procedure: RIGHT CARPAL TUNNEL RELEASE;  Surgeon: Ashok Pall, MD;  Location: [place];  Service: Neurosurgery;  Laterality: Right;  Right CARPAL TUNNEL RELEASE  . CARPAL TUNNEL RELEASE Left 08/31/2018   Procedure: LEFT CARPAL TUNNEL RELEASE;  Surgeon: Ashok Pall, MD;  [facility];  Service: Neurosurgery;  Laterality: Left;  . DILITATION & CURRETTAGE/HYSTROSCOPY WITH  THERMACHOICE ABLATION N/A 08/21/2014   Procedure: DILATATION & CURETTAGE/HYSTEROSCOPY WITH THERMACHOICE ABLATION;  Surgeon: Jonnie Kind, MD;  Location: [facility];  Service: Gynecology;  Laterality: N/A;  . KNEE ARTHROSCOPY Left 10/16/2015   Procedure: ARTHROSCOPY KNEE with debridment;  Surgeon: Frederik Pear, MD;  Location: [place];  Service: Orthopedics;  Laterality: Left;  . KNEE ARTHROSCOPY WITH MEDIAL MENISECTOMY Right 07/17/2015   Procedure: KNEE ARTHROSCOPY WITH MEDIAL MENISECTOMY;  Surgeon: Frederik Pear, MD;  Location: [facility];  Service: Orthopedics;  Laterality: Right;  . POLYPECTOMY N/A 08/21/2014   Procedure: ENDOMETRIAL POLYPECTOMY;  Surgeon: Jonnie Kind, MD;  Location: [facility];  Service: Gynecology;  Laterality: N/A;  . REFRACTIVE SURGERY Right    micro aneuysms  . ULNAR NERVE TRANSPOSITION Right 11/24/2017   Procedure: RIGHT ULNAR NERVE DECOMPRESSION/TRANSPOSITION;  Surgeon: Ashok Pall, MD;  Location: [place];  Service: Neurosurgery;  Laterality: Right;  Right ULNAR NERVE DECOMPRESSION/TRANSPOSITION     ALLERGIES:  Allergies  Allergen Reactions  . Penicillins Other (See Comments)    PATIENT HAS HAD A PCN REACTION WITH IMMEDIATE RASH, FACIAL/TONGUE/THROAT SWELLING, SOB, OR LIGHTHEADEDNESS WITH HYPOTENSION:  #  #  YES  #  #  Has patient had a PCN reaction causing severe rash involving mucus membranes or skin necrosis: No Has patient had a PCN reaction that required hospitalization No Has patient had a PCN reaction occurring within the last 10 years: No   . Other Hives, Itching and Rash    Powered gloves Arthropod Insect  . Seasonal Ic [Cholestatin] Other (See Comments)    Sneezing, runny nose     CURRENT MEDICATIONS:  Outpatient Encounter Medications as of 09/26/2019  Medication Sig  . ACCU-CHEK AVIVA PLUS test strip use 1 test strips 2 times daily  . acetaminophen (TYLENOL) 500 MG tablet Take 1,000 mg by mouth every 6 (six) hours as  needed for mild pain or headache.  Marland Kitchen acetaminophen-codeine (TYLENOL #3) 300-30 MG tablet Take 1 tablet by mouth every 6 (six) hours as needed for moderate pain.  Marland Kitchen albuterol (PROVENTIL HFA;VENTOLIN HFA) 108 (90 Base) MCG/ACT inhaler Inhale 2 puffs into the lungs every 6 (six) hours as needed for wheezing or shortness of breath.   . ALLERGY 10 MG tablet Take 20 mg by mouth daily.   Marland Kitchen amitriptyline (ELAVIL) 100 MG tablet Take 100 mg by mouth at bedtime.  . benazepril (LOTENSIN) 20 MG tablet Take 30 mg by mouth daily.  . benzonatate (TESSALON) 100 MG capsule Take 1 capsule (100 mg total) by mouth every 8 (eight) hours.  . bisacodyl (DULCOLAX) 5 MG EC tablet Take 5 mg by mouth 2 (two) times daily.   . Blood Glucose Monitoring Suppl (ACCU-CHEK AVIVA PLUS) w/Device KIT USE TO CHECK BLOOD SUGAR  . bumetanide (BUMEX) 0.5 MG tablet Take 1 mg by mouth 2 (two) times daily.   Marland Kitchen  Cholecalciferol 25 MCG (1000 UT) capsule Take 1,000 Units by mouth 2 (two) times daily.  . ciclopirox (LOPROX) 0.77 % cream Apply 1 application topically 2 (two) times daily as needed (itching).   . clobetasol cream (TEMOVATE) 0.05 % Apply topically.  Marland Kitchen CLOBETASOL PROPIONATE E 0.05 % emollient cream Apply 1 application topically 2 (two) times daily as needed (itching).   . DULoxetine (CYMBALTA) 60 MG capsule Take 1 capsule (60 mg total) by mouth daily. (Patient taking differently: Take 60 mg by mouth at bedtime. )  . Exenatide ER (BYDUREON) 2 MG PEN Inject 1 Dose into the skin once a week. (Patient taking differently: Inject 2 mg into the skin once a week. On Sundays)  . FERREX 150 150 MG capsule Take 150 mg by mouth 2 (two) times daily.   . hydrocortisone cream 1 % Apply to affected area 2 times daily (Patient taking differently: Apply 1 application topically daily as needed for itching (bites). Apply to affected area 2 times daily)  . hydrOXYzine (ATARAX/VISTARIL) 25 MG tablet Take 25 mg by mouth at bedtime.  . insulin aspart  (NOVOLOG) 100 UNIT/ML injection Inject 2-15 Units into the skin 2 (two) times daily before a meal. Per sliding scale for BS is 121 or greater  . insulin glargine (LANTUS) 100 UNIT/ML injection Inject 0.28 mLs (28 Units total) into the skin at bedtime. (Patient taking differently: Inject 38 Units into the skin at bedtime. )  . Iron-FA-B Cmp-C-Biot-Probiotic (FUSION PLUS) CAPS Take by mouth.  . lidocaine (LIDODERM) 5 % Place 1 patch onto the skin daily as needed (pain). Remove & Discard patch within 12 hours or as directed by MD  . Magnesium 250 MG TABS Take 250 mg by mouth 2 (two) times daily.  . Magnesium Gluconate 550 MG TABS Take by mouth.  . meloxicam (MOBIC) 15 MG tablet Take 15 mg by mouth daily as needed for pain.  . methocarbamol (ROBAXIN) 500 MG tablet Take 500 mg by mouth 2 (two) times daily.  . metoprolol succinate (TOPROL-XL) 25 MG 24 hr tablet Take by mouth.  . metoprolol tartrate (LOPRESSOR) 25 MG tablet Take 25 mg by mouth daily with breakfast.   . NOVOFINE PLUS 32G X 4 MM MISC USE 1 PEN NEEDLE 3 TIMES DAILY  . Omega-3 Fatty Acids (EQL OMEGA 3 FISH OIL) 1400 MG CAPS Take 1,400 mg by mouth every evening.  . Ondansetron HCl (ZOFRAN PO) Take by mouth.  Marland Kitchen OVER THE COUNTER MEDICATION Take 1 capsule by mouth daily. Active Lean Keto Dietary Supplement  . PREVIDENT 5000 SENSITIVE 1.1-5 % PSTE Take 1 application by mouth See admin instructions. After regular brushing, flossing and rinsing, brush a pea size of this paste for 2 minutes then spit it out thoroughly  . rosuvastatin (CRESTOR) 40 MG tablet Take 40 mg by mouth at bedtime.   . SF 5000 PLUS 1.1 % CREA dental cream AFTER REGULAR BRUSHING FLOSSING AND RINSING BRUSH PEA SIZE OF PASTE FOR 2 MINUES SPIT OUT THOROUGHLY. DO NOT RINSE AND NOTHING BY MOUTH FOR  . sitaGLIPtin-metformin (JANUMET) 50-1000 MG tablet Take 1 tablet by mouth 2 (two) times daily with a meal.  . traMADol (ULTRAM) 50 MG tablet Take 50 mg by mouth daily.    No  facility-administered encounter medications on file as of 09/26/2019.     ONCOLOGIC FAMILY HISTORY:  Family History  Problem Relation Age of Onset  . Breast cancer Paternal Grandmother 51       breast; dbl mastectomy  .  Diabetes Father   . Heart disease Father   . Hypertension Father   . Heart attack Father   . Diabetes Mother   . Heart disease Mother   . Hypertension Mother   . Diabetes Sister   . Fibromyalgia Sister   . Diabetes Sister   . Heart attack Maternal Grandmother        <35  . Heart attack Maternal Grandfather   . Heart attack Paternal Grandfather   . Diabetes Brother      SOCIAL HISTORY:  Social History   Socioeconomic History  . Marital status: Single    Spouse name: Not on file  . Number of children: Not on file  . Years of education: college  . Highest education level: Not on file  Occupational History  . Occupation: care giver    Employer: [employer]  Tobacco Use  . Smoking status: Never Smoker  . Smokeless tobacco: Never Used  Substance and Sexual Activity  . Alcohol use: No  . Drug use: No  . Sexual activity: Not Currently    Birth control/protection: None, Abstinence  Other Topics Concern  . Not on file  Social History Narrative   Patient lives at home with her mother and she is single.   Patient works for [employer] .   Education college    Right handed   Caffeine none    Social Determinants of Health   Financial Resource Strain:   . Difficulty of Paying Living Expenses: Not on file  Food Insecurity:   . Worried About Charity fundraiser in the Last Year: Not on file  . Ran Out of Food in the Last Year: Not on file  Transportation Needs:   . Lack of Transportation (Medical): Not on file  . Lack of Transportation (Non-Medical): Not on file  Physical Activity:   . Days of Exercise per Week: Not on file  . Minutes of Exercise per Session: Not on file  Stress:   . Feeling of Stress : Not on file  Social  Connections:   . Frequency of Communication with Friends and Family: Not on file  . Frequency of Social Gatherings with Friends and Family: Not on file  . Attends Religious Services: Not on file  . Active Member of Clubs or Organizations: Not on file  . Attends Archivist Meetings: Not on file  . Marital Status: Not on file  Intimate Partner Violence:   . Fear of Current or Ex-Partner: Not on file  . Emotionally Abused: Not on file  . Physically Abused: Not on file  . Sexually Abused: Not on file      PHYSICAL EXAMINATION:  Vital Signs: There were no vitals filed for this visit. There were no vitals filed for this visit. General: Well-nourished, well-appearing female in no acute distress.  Accompanied by her mother today.   HEENT: Head is normocephalic.  Pupils equal and reactive to light. Conjunctivae clear without exudate.  Sclerae anicteric. Oral mucosa is pink, moist.  Oropharynx is pink without lesions or erythema.  Lymph: No cervical, supraclavicular, or infraclavicular lymphadenopathy noted on palpation.  Cardiovascular: Regular rate and rhythm.Marland Kitchen Respiratory: Clear to auscultation bilaterally. Chest expansion symmetric; breathing non-labored.  Breast Exam:  -Left breast: very faint left breast subcutaneous soft lesion, about 1cm.  Otherwise the left breast is benign -Right breast: No appreciable masses on palpation. No skin redness, thickening, or peau d'orange appearance; no nipple retraction or nipple discharge; mild distortion in  symmetry at previous lumpectomy site well healed scar without erythema or nodularity. -Axilla: No axillary adenopathy bilaterally.  GI: Abdomen soft and round; non-tender, non-distended. Bowel sounds normoactive. No hepatosplenomegaly.   GU: Deferred.  Neuro: No focal deficits. Steady gait.  Psych: Mood and affect normal and appropriate for situation.  MSK: No focal spinal tenderness to palpation, full range of motion in bilateral upper  extremities Extremities: No edema. Skin: Warm and dry.  LABORATORY DATA:  None for this visit  DIAGNOSTIC IMAGING:  Most recent mammogram:  CLINICAL DATA:  Screening.  EXAM: DIGITAL SCREENING BILATERAL MAMMOGRAM WITH TOMO AND CAD  COMPARISON:  Previous exam(s).  ACR Breast Density Category c: The breast tissue is heterogeneously dense, which may obscure small masses.  FINDINGS: There are no findings suspicious for malignancy. Images were processed with CAD.  IMPRESSION: No mammographic evidence of malignancy. A result letter of this screening mammogram will be mailed directly to the patient.  RECOMMENDATION: Screening mammogram in one year. (Code:SM-B-01Y)  BI-RADS CATEGORY  1: Negative.   Electronically Signed   By: Lillia Mountain M.D.   On: 05/01/2019 12:31    ASSESSMENT AND PLAN:  Alicia Coffey is a pleasant 48 y.o. female with history of LCIS s/p lumpectomy and Tamoxifen completed in 2017.  [facility] for surveillance and routine follow-up.   1. History of LCIS:  She is up to date with mammogram and will continue this annually. I placed an order for breast MRI considering her h/o LCIS, continued left breast abnormality, and increased TC score.  She will do this in 10/2019. She will return to see me annually.  I encouraged her to call me with any questions or concerns before her next visit at the cancer center, and I would be happy to see her sooner, if needed.    2. Cancer screening:  Due to Alicia Coffey's history and her age, she should receive screening for skin cancers, colon cancer, and gynecologic cancers. She was encouraged to follow-up with her PCP for appropriate cancer screenings.   3. Health maintenance and wellness promotion: Alicia Coffey was encouraged to consume 5-7 servings of fruits and vegetables per day. She was also encouraged to engage in moderate to vigorous exercise for 30 minutes per day most days of the week. She was  instructed to limit her alcohol consumption and continue to abstain from tobacco use.    Dispo:  -Return to cancer center in one year for long term follow up -Mammogram in 04/2020 -Breast MRI in 10/2019   A total of (20) minutes of face-to-face time was spent with this patient with greater than 50% of that time in counseling and care-coordination.   Gardenia Phlegm, NP Survivorship Program [facility] [phone]   Note: PRIMARY CARE PROVIDER Vonna Drafts, Register [phone]

## 2019-10-13 HISTORY — PX: BREAST LUMPECTOMY: SHX2

## 2019-10-13 HISTORY — PX: BREAST BIOPSY: SHX20

## 2019-10-17 ENCOUNTER — Other Ambulatory Visit: Payer: Self-pay | Admitting: Adult Health

## 2019-10-17 DIAGNOSIS — M6530 Trigger finger, unspecified finger: Secondary | ICD-10-CM | POA: Insufficient documentation

## 2019-11-02 ENCOUNTER — Ambulatory Visit
Admission: RE | Admit: 2019-11-02 | Discharge: 2019-11-02 | Disposition: A | Discharge status: Home / Self Care | Payer: Medicaid Other | Source: Ambulatory Visit | Attending: Adult Health | Admitting: Adult Health

## 2019-11-02 ENCOUNTER — Other Ambulatory Visit: Payer: Self-pay

## 2019-11-02 DIAGNOSIS — D0501 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: Secondary | ICD-10-CM

## 2019-11-02 MED ORDER — GADOBUTROL 1 MMOL/ML IV SOLN
10.0000 mL | Freq: Once | INTRAVENOUS | Status: AC | PRN
  Administered 2019-11-02: 10 mL via INTRAVENOUS

## 2019-11-07 ENCOUNTER — Other Ambulatory Visit: Payer: Self-pay | Admitting: Adult Health

## 2019-11-07 DIAGNOSIS — N63 Unspecified lump in unspecified breast: Secondary | ICD-10-CM

## 2019-11-07 NOTE — Progress Notes (Signed)
I talked to Alicia Coffey about her results and placed orders for ultrasound, ultrasound biopsy (if possible), and MR biopsy of her left breast.  Patient understands and will await a call for scheduling from the breast center.  Wilber Bihari, NP

## 2019-11-10 ENCOUNTER — Other Ambulatory Visit: Payer: Self-pay | Admitting: Adult Health

## 2019-11-10 DIAGNOSIS — N63 Unspecified lump in unspecified breast: Secondary | ICD-10-CM

## 2019-11-14 ENCOUNTER — Other Ambulatory Visit: Payer: Self-pay

## 2019-11-14 ENCOUNTER — Ambulatory Visit
Admission: RE | Admit: 2019-11-14 | Discharge: 2019-11-14 | Disposition: A | Discharge status: Home / Self Care | Payer: Medicaid Other | Source: Ambulatory Visit | Attending: Adult Health | Admitting: Adult Health

## 2019-11-14 DIAGNOSIS — N63 Unspecified lump in unspecified breast: Secondary | ICD-10-CM

## 2019-11-15 DIAGNOSIS — M67449 Ganglion, unspecified hand: Secondary | ICD-10-CM | POA: Insufficient documentation

## 2019-11-15 DIAGNOSIS — M65342 Trigger finger, left ring finger: Secondary | ICD-10-CM | POA: Insufficient documentation

## 2019-11-23 ENCOUNTER — Ambulatory Visit
Admission: RE | Admit: 2019-11-23 | Discharge: 2019-11-23 | Disposition: A | Discharge status: Home / Self Care | Payer: Medicaid Other | Source: Ambulatory Visit | Attending: Adult Health | Admitting: Adult Health

## 2019-11-23 ENCOUNTER — Other Ambulatory Visit: Payer: Self-pay

## 2019-11-23 ENCOUNTER — Other Ambulatory Visit: Payer: Self-pay | Admitting: Radiology

## 2019-11-23 DIAGNOSIS — N63 Unspecified lump in unspecified breast: Secondary | ICD-10-CM

## 2019-11-23 MED ORDER — GADOBUTROL 1 MMOL/ML IV SOLN
10.0000 mL | Freq: Once | INTRAVENOUS | Status: AC | PRN
  Administered 2019-11-23: 10 mL via INTRAVENOUS

## 2019-11-29 ENCOUNTER — Other Ambulatory Visit: Payer: Self-pay

## 2019-11-29 ENCOUNTER — Encounter: Payer: Self-pay | Admitting: Podiatry

## 2019-11-29 ENCOUNTER — Ambulatory Visit: Payer: Medicaid Other | Admitting: Podiatry

## 2019-11-29 DIAGNOSIS — E1142 Type 2 diabetes mellitus with diabetic polyneuropathy: Secondary | ICD-10-CM

## 2019-11-29 DIAGNOSIS — M79676 Pain in unspecified toe(s): Secondary | ICD-10-CM

## 2019-11-29 DIAGNOSIS — L84 Corns and callosities: Secondary | ICD-10-CM | POA: Diagnosis not present

## 2019-11-29 DIAGNOSIS — B351 Tinea unguium: Secondary | ICD-10-CM

## 2019-11-29 NOTE — Patient Instructions (Signed)
Diabetes Mellitus and Foot Care Foot care is an important part of your health, especially when you have diabetes. Diabetes may cause you to have problems because of poor blood flow (circulation) to your feet and legs, which can cause your skin to:  Become thinner and drier.  Break more easily.  Heal more slowly.  Peel and crack. You may also have nerve damage (neuropathy) in your legs and feet, causing decreased feeling in them. This means that you may not notice minor injuries to your feet that could lead to more serious problems. Noticing and addressing any potential problems early is the best way to prevent future foot problems. How to care for your feet Foot hygiene  Wash your feet daily with warm water and mild soap. Do not use hot water. Then, pat your feet and the areas between your toes until they are completely dry. Do not soak your feet as this can dry your skin.  Trim your toenails straight across. Do not dig under them or around the cuticle. File the edges of your nails with an emery board or nail file.  Apply a moisturizing lotion or petroleum jelly to the skin on your feet and to dry, brittle toenails. Use lotion that does not contain alcohol and is unscented. Do not apply lotion between your toes. Shoes and socks  Wear clean socks or stockings every day. Make sure they are not too tight. Do not wear knee-high stockings since they may decrease blood flow to your legs.  Wear shoes that fit properly and have enough cushioning. Always look in your shoes before you put them on to be sure there are no objects inside.  To break in new shoes, wear them for just a few hours a day. This prevents injuries on your feet. Wounds, scrapes, corns, and calluses  Check your feet daily for blisters, cuts, bruises, sores, and redness. If you cannot see the bottom of your feet, use a mirror or ask someone for help.  Do not cut corns or calluses or try to remove them with medicine.  If you  find a minor scrape, cut, or break in the skin on your feet, keep it and the skin around it clean and dry. You may clean these areas with mild soap and water. Do not clean the area with peroxide, alcohol, or iodine.  If you have a wound, scrape, corn, or callus on your foot, look at it several times a day to make sure it is healing and not infected. Check for: ? Redness, swelling, or pain. ? Fluid or blood. ? Warmth. ? Pus or a bad smell. General instructions  Do not cross your legs. This may decrease blood flow to your feet.  Do not use heating pads or hot water bottles on your feet. They may burn your skin. If you have lost feeling in your feet or legs, you may not know this is happening until it is too late.  Protect your feet from hot and cold by wearing shoes, such as at the beach or on hot pavement.  Schedule a complete foot exam at least once a year (annually) or more often if you have foot problems. If you have foot problems, report any cuts, sores, or bruises to your health care provider immediately. Contact a health care provider if:  You have a medical condition that increases your risk of infection and you have any cuts, sores, or bruises on your feet.  You have an injury that is not   healing.  You have redness on your legs or feet.  You feel burning or tingling in your legs or feet.  You have pain or cramps in your legs and feet.  Your legs or feet are numb.  Your feet always feel cold.  You have pain around a toenail. Get help right away if:  You have a wound, scrape, corn, or callus on your foot and: ? You have pain, swelling, or redness that gets worse. ? You have fluid or blood coming from the wound, scrape, corn, or callus. ? Your wound, scrape, corn, or callus feels warm to the touch. ? You have pus or a bad smell coming from the wound, scrape, corn, or callus. ? You have a fever. ? You have a red line going up your leg. Summary  Check your feet every day  for cuts, sores, red spots, swelling, and blisters.  Moisturize feet and legs daily.  Wear shoes that fit properly and have enough cushioning.  If you have foot problems, report any cuts, sores, or bruises to your health care provider immediately.  Schedule a complete foot exam at least once a year (annually) or more often if you have foot problems. This information is not intended to replace advice given to you by your health care provider. Make sure you discuss any questions you have with your health care provider. Document Revised: 06/21/2019 Document Reviewed: 10/30/2016 Elsevier Patient Education  2020 Elsevier Inc.  

## 2019-11-30 NOTE — Progress Notes (Signed)
Subjective: Alicia Coffey presents today for follow up of preventative diabetic foot care and corn(s) b/l 2nd digits and painful mycotic toenails b/l that are difficult to trim. Pain interferes with ambulation. Aggravating factors include wearing enclosed shoe gear. Pain is relieved with periodic professional debridement..   Allergies  Allergen Reactions  . Penicillins Other (See Comments)    PATIENT HAS HAD A PCN REACTION WITH IMMEDIATE RASH, FACIAL/TONGUE/THROAT SWELLING, SOB, OR LIGHTHEADEDNESS WITH HYPOTENSION:  #  #  YES  #  #  Has patient had a PCN reaction causing severe rash involving mucus membranes or skin necrosis: No Has patient had a PCN reaction that required hospitalization No Has patient had a PCN reaction occurring within the last 10 years: No   . Other Hives, Itching and Rash    Powered gloves Arthropod Insect  . Seasonal Ic [Cholestatin] Other (See Comments)    Sneezing, runny nose     Objective: There were no vitals filed for this visit.  Vascular Examination:  Capillary refill time to digits immediate b/l, palpable DP pulses b/l, faintly palpable PT pulses b/l, pedal hair absent b/l and skin temperature gradient within normal limits b/l  Dermatological Examination: Pedal skin with normal turgor, texture and tone bilaterally, no open wounds bilaterally, no interdigital macerations bilaterally, toenails 1-5 b/l elongated, dystrophic, thickened, crumbly with subungual debris and hyperkeratotic lesion(s) distal tip of b/l 2nd digits. Right 2nd digit with subdermal hemorrhage.  No erythema, no edema, no drainage, no flocculence  Musculoskeletal: Normal muscle strength 5/5 to all lower extremity muscle groups bilaterally, no pain crepitus or joint limitation noted with ROM b/l and hammertoes noted to the  2-5 bilaterally  Neurological: Protective sensation decreased with 10 gram monofilament b/l and vibratory sensation absent b/l  Assessment: 1. Pain due to  onychomycosis of nail   2. Corns   3. Diabetic peripheral neuropathy associated with type 2 diabetes mellitus ([place])    Plan: -Continue diabetic foot care principles. Literature dispensed on today.  -Toenails 1-5 b/l were debrided in length and girth without iatrogenic bleeding. -Corn(s) debrided distal tips b/l 2nd digits without complication or incident. Total number debrided=2 -Patient to continue soft, supportive shoe gear daily. -Patient to report any pedal injuries to medical professional immediately. -Dispensed toe tunnel for daily use for b/l 2nd digits. Apply every morning. Remove every evening. -Patient/POA to call should there be question/concern in the interim.  Return in about 10 weeks (around 02/07/2020) for diabetic nail and callus trim.

## 2019-12-11 ENCOUNTER — Other Ambulatory Visit: Payer: Self-pay | Admitting: Surgery

## 2019-12-11 DIAGNOSIS — N6022 Fibroadenosis of left breast: Secondary | ICD-10-CM

## 2019-12-11 DIAGNOSIS — D242 Benign neoplasm of left breast: Secondary | ICD-10-CM

## 2019-12-14 DIAGNOSIS — Z4789 Encounter for other orthopedic aftercare: Secondary | ICD-10-CM | POA: Insufficient documentation

## 2019-12-21 ENCOUNTER — Other Ambulatory Visit: Payer: Self-pay | Admitting: Surgery

## 2019-12-21 DIAGNOSIS — D242 Benign neoplasm of left breast: Secondary | ICD-10-CM

## 2019-12-21 DIAGNOSIS — N6022 Fibroadenosis of left breast: Secondary | ICD-10-CM

## 2020-01-22 ENCOUNTER — Other Ambulatory Visit: Payer: Self-pay

## 2020-01-22 ENCOUNTER — Encounter (HOSPITAL_BASED_OUTPATIENT_CLINIC_OR_DEPARTMENT_OTHER): Payer: Self-pay | Admitting: Surgery

## 2020-01-26 ENCOUNTER — Other Ambulatory Visit (HOSPITAL_COMMUNITY)
Admission: RE | Admit: 2020-01-26 | Discharge: 2020-01-26 | Disposition: A | Discharge status: Home / Self Care | Payer: Medicaid Other | Source: Ambulatory Visit | Attending: Surgery | Admitting: Surgery

## 2020-01-26 ENCOUNTER — Encounter (HOSPITAL_BASED_OUTPATIENT_CLINIC_OR_DEPARTMENT_OTHER)
Admission: RE | Admit: 2020-01-26 | Discharge: 2020-01-26 | Disposition: A | Discharge status: Home / Self Care | Payer: Medicaid Other | Source: Ambulatory Visit | Attending: Surgery | Admitting: Surgery

## 2020-01-26 DIAGNOSIS — R9431 Abnormal electrocardiogram [ECG] [EKG]: Secondary | ICD-10-CM | POA: Insufficient documentation

## 2020-01-26 DIAGNOSIS — Z20822 Contact with and (suspected) exposure to covid-19: Secondary | ICD-10-CM | POA: Diagnosis not present

## 2020-01-26 DIAGNOSIS — Z01818 Encounter for other preprocedural examination: Secondary | ICD-10-CM | POA: Insufficient documentation

## 2020-01-26 DIAGNOSIS — I10 Essential (primary) hypertension: Secondary | ICD-10-CM | POA: Insufficient documentation

## 2020-01-26 LAB — BASIC METABOLIC PANEL
Anion gap: 11 (ref 5–15)
BUN: 13 mg/dL (ref 6–20)
CO2: 25 mmol/L (ref 22–32)
Calcium: 8.7 mg/dL — ABNORMAL LOW (ref 8.9–10.3)
Chloride: 101 mmol/L (ref 98–111)
Creatinine, Ser: 1.02 mg/dL — ABNORMAL HIGH (ref 0.44–1.00)
GFR calc Af Amer: >60 mL/min (ref >60)
GFR calc non Af Amer: >60 mL/min (ref >60)
Glucose, Bld: 181 mg/dL — ABNORMAL HIGH (ref 70–99)
Potassium: 4.4 mmol/L (ref 3.5–5.1)
Sodium: 137 mmol/L (ref 135–145)

## 2020-01-26 LAB — SARS CORONAVIRUS 2 (TAT 6-24 HRS): SARS Coronavirus 2: NEGATIVE

## 2020-01-26 LAB — POCT PREGNANCY, URINE: Preg Test, Ur: NEGATIVE

## 2020-01-26 MED ORDER — ENSURE PRE-SURGERY PO LIQD: 296.0000 mL | Freq: Once | ORAL | Status: DC

## 2020-01-26 NOTE — Progress Notes (Signed)

## 2020-01-29 ENCOUNTER — Other Ambulatory Visit: Payer: Self-pay

## 2020-01-29 ENCOUNTER — Ambulatory Visit
Admission: RE | Admit: 2020-01-29 | Discharge: 2020-01-29 | Disposition: A | Discharge status: Home / Self Care | Payer: Medicaid Other | Source: Ambulatory Visit | Attending: Surgery | Admitting: Surgery

## 2020-01-29 DIAGNOSIS — N6022 Fibroadenosis of left breast: Secondary | ICD-10-CM

## 2020-01-29 DIAGNOSIS — D242 Benign neoplasm of left breast: Secondary | ICD-10-CM

## 2020-01-29 NOTE — H&P (Signed)
Alicia Coffey  Location: [facility] Patient #: [id number] DOB: 26-Jan-1971 Single / Language: Cleophus Molt / Race: Black or African American Female   History of Present Illness (Monnie Gudgel A. Ninfa Linden MD; The patient is a 49 year old female who presents with a complaint of Breast problems. 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 One area showed a complex sclerosing lesion with atypia. The other biopsies of left breast showed a papillary lesion with atypia. She denies nipple discharge. She is otherwise without complaints. She did report that she developed a hematoma was recent biopsy.   Past Surgical History Andreas Blower, [place];  Breast Biopsy  Left. multiple Knee Surgery  Bilateral.  Diagnostic Studies History Andreas Blower, CMA Colonoscopy  never Mammogram  within last year Pap Smear  1-5 years ago  Allergies Penicillins  Hives.  Medication History Aspirin (81MG  Tablet, Oral) Active. Tylenol (500MG  Capsule, Oral) Active. Elavil (100MG  Tablet, Oral) Active. Allergy (10MG  Tablet, Oral) Active. Benazepril HCl (10MG  Tablet, Oral) Active. Dulcolax (5MG  Tablet DR, Oral) Active. Cymbalta (60MG  Capsule DR Part, Oral) Active. Vibramycin (100MG  Capsule, Oral) Active. Ferrex 150 (150MG  Capsule, Oral) Active. Atarax (25MG  Tablet, Oral) Active. NovoLOG (100UNIT/ML Solution, Subcutaneous) Active. Lantus (100UNIT/ML Solution, Subcutaneous) Active. Meloxicam (15MG  Tablet, Oral) Active. Robaxin (500MG  Tablet, Oral) Active. Metoprolol Succinate ER (25MG  Tablet ER 24HR, Oral) Active. Janumet XR (50-1000MG  Tablet ER 24HR, Oral) Active. Ultram (50MG  Tablet, Oral) Active. Crestor (40MG  Tablet, Oral) Active. Bumex (0.5MG  Tablet,  Oral) Active. Temovate E (0.05% Cream, External) Active. Flexeril (10MG  Tablet, Oral) Active. Albuterol Sulfate HFA (108 (90 Base)MCG/ACT Aerosol Soln, Inhalation) Active. Medications Reconciled  Social History  Caffeine use  Tea. No alcohol use  No drug use  Tobacco use  Never smoker.  Family History (Andreas Blower, [place] Arthritis  Mother, Sister. Bleeding disorder  Mother, Sister. Cerebrovascular Accident  Sister. Diabetes Mellitus  Brother, Father, Mother, Sister. Heart Disease  Father. Heart disease in female family member before age 58  Hypertension  Brother, Father, Mother, Sister. Kidney Disease  Father. Migraine Headache  Brother.  Pregnancy / Birth History Andreas Blower, [place]; Age at menarche  51 years. Gravida  0 Para  0 Regular periods   Other Problems Anxiety Disorder  Arthritis  Back Pain  Diabetes Mellitus  High blood pressure  Hypercholesterolemia  Lump In Breast     Review of Systems Skin Not Present- Change in Wart/Mole, Dryness, Hives, Jaundice, New Lesions, Non-Healing Wounds, Rash and Ulcer. HEENT Present- Hearing Loss, Seasonal Allergies and Wears glasses/contact lenses. Not Present- Earache, Hoarseness, Nose Bleed, Oral Ulcers, Ringing in the Ears, Sinus Pain, Sore Throat, Visual Disturbances and Yellow Eyes. Breast Present- Breast Mass. Not Present- Breast Pain, Nipple Discharge and Skin Changes. Cardiovascular Present- Shortness of Breath and Swelling of Extremities. Not Present- Chest Pain, Difficulty Breathing Lying Down, Leg Cramps, Palpitations and Rapid Heart Rate. Gastrointestinal Not Present- Abdominal Pain, Bloating, Bloody Stool, Change in Bowel Habits, Chronic diarrhea, Constipation, Difficulty Swallowing, Excessive gas, Gets full quickly at meals, Hemorrhoids, Indigestion, Nausea, Rectal Pain and Vomiting. Female Genitourinary Not Present- Frequency, Nocturia, Painful Urination, Pelvic Pain and  Urgency. Musculoskeletal Present- Back Pain, Joint Pain, Joint Stiffness, Muscle Pain, Muscle Weakness and Swelling of Extremities. Neurological Present- Numbness, Tingling, Tremor, Trouble walking and Weakness. Not Present-  Decreased Memory, Fainting, Headaches and Seizures. Psychiatric Present- Anxiety. Not Present- Bipolar, Change in Sleep Pattern, Depression, Fearful and Frequent crying. Endocrine Present- Hot flashes. Not Present- Cold Intolerance, Excessive Hunger, Hair Changes, Heat Intolerance and New Diabetes. Hematology Not Present- Blood Thinners, Easy Bruising, Excessive bleeding, Gland problems, HIV and Persistent Infections.  Vital  Weight: 262.38 lb Height: 63.5in Body Surface Area: 2.18 m Body Mass Index: 45.75 kg/m  Temp.: 97.76F  Pulse: 100 (Regular)  P.OX: 97% (Room air) BP: 138/90 (Sitting, Left Arm, Standard)       Physical Exam  The physical exam findings are as follows: Note:On exam today, she is well appearance.  On exam, she has well-healed incisions on her breast. There is a hematoma at the 12 o'clock position of the left breast which is palpable. There are no other abnormalities. There is no axillary adenopathy.    Assessment & Plan  SCLEROSING ADENOSIS OF BREAST, LEFT (N60.22)  Impression: I have reviewed her mammograms, ultrasounds, and pathology results in detail. I have also reviewed her previous pathology results showing LCIS. Again, given her history of LCIS and family history of breast cancer, she understands she is higher risk of developing breast cancer in her lifetime. I discussed the pathology on both the areas in the left breast and gave her a copy of the results. A radioactive seed guided left breast lumpectomy 2 distally recommended for complete histologic evaluation of both areas to rule out malignancy. I discussed the surgical procedure in detail. I discussed the risk which includes but is not limited to bleeding, infection,  injury to surrounding structures, the need for further surgery if malignancy is found, cardiopulmonary issues, etc. She understands and wishes to proceed with surgery which will be scheduled.  This is a required moderate decision-making. 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

## 2020-01-30 ENCOUNTER — Ambulatory Visit (HOSPITAL_BASED_OUTPATIENT_CLINIC_OR_DEPARTMENT_OTHER): Payer: Medicaid Other | Admitting: Certified Registered Nurse Anesthetist

## 2020-01-30 ENCOUNTER — Ambulatory Visit (HOSPITAL_BASED_OUTPATIENT_CLINIC_OR_DEPARTMENT_OTHER)
Admission: RE | Admit: 2020-01-30 | Discharge: 2020-01-30 | Disposition: A | Discharge status: Home / Self Care | Payer: Medicaid Other | Attending: Surgery | Admitting: Surgery

## 2020-01-30 ENCOUNTER — Encounter (HOSPITAL_BASED_OUTPATIENT_CLINIC_OR_DEPARTMENT_OTHER)
Admission: RE | Disposition: A | Discharge status: Home / Self Care | Payer: Self-pay | Source: Home / Self Care | Attending: Surgery

## 2020-01-30 ENCOUNTER — Ambulatory Visit
Admission: RE | Admit: 2020-01-30 | Discharge: 2020-01-30 | Disposition: A | Discharge status: Home / Self Care | Payer: Medicaid Other | Source: Ambulatory Visit | Attending: Surgery | Admitting: Surgery

## 2020-01-30 ENCOUNTER — Encounter (HOSPITAL_BASED_OUTPATIENT_CLINIC_OR_DEPARTMENT_OTHER): Payer: Self-pay | Admitting: Surgery

## 2020-01-30 DIAGNOSIS — Z8249 Family history of ischemic heart disease and other diseases of the circulatory system: Secondary | ICD-10-CM | POA: Insufficient documentation

## 2020-01-30 DIAGNOSIS — E119 Type 2 diabetes mellitus without complications: Secondary | ICD-10-CM | POA: Diagnosis not present

## 2020-01-30 DIAGNOSIS — I10 Essential (primary) hypertension: Secondary | ICD-10-CM | POA: Diagnosis not present

## 2020-01-30 DIAGNOSIS — Z794 Long term (current) use of insulin: Secondary | ICD-10-CM | POA: Diagnosis not present

## 2020-01-30 DIAGNOSIS — D242 Benign neoplasm of left breast: Secondary | ICD-10-CM

## 2020-01-30 DIAGNOSIS — M797 Fibromyalgia: Secondary | ICD-10-CM | POA: Insufficient documentation

## 2020-01-30 DIAGNOSIS — F329 Major depressive disorder, single episode, unspecified: Secondary | ICD-10-CM | POA: Insufficient documentation

## 2020-01-30 DIAGNOSIS — N6022 Fibroadenosis of left breast: Secondary | ICD-10-CM

## 2020-01-30 DIAGNOSIS — Z791 Long term (current) use of non-steroidal anti-inflammatories (NSAID): Secondary | ICD-10-CM | POA: Diagnosis not present

## 2020-01-30 DIAGNOSIS — Z853 Personal history of malignant neoplasm of breast: Secondary | ICD-10-CM | POA: Diagnosis not present

## 2020-01-30 DIAGNOSIS — Z7982 Long term (current) use of aspirin: Secondary | ICD-10-CM | POA: Diagnosis not present

## 2020-01-30 DIAGNOSIS — F419 Anxiety disorder, unspecified: Secondary | ICD-10-CM | POA: Insufficient documentation

## 2020-01-30 DIAGNOSIS — G4730 Sleep apnea, unspecified: Secondary | ICD-10-CM | POA: Diagnosis not present

## 2020-01-30 DIAGNOSIS — D649 Anemia, unspecified: Secondary | ICD-10-CM | POA: Diagnosis not present

## 2020-01-30 DIAGNOSIS — N6489 Other specified disorders of breast: Secondary | ICD-10-CM | POA: Diagnosis present

## 2020-01-30 DIAGNOSIS — Z6841 Body Mass Index (BMI) 40.0 and over, adult: Secondary | ICD-10-CM | POA: Diagnosis not present

## 2020-01-30 DIAGNOSIS — Z880 Allergy status to penicillin: Secondary | ICD-10-CM | POA: Insufficient documentation

## 2020-01-30 DIAGNOSIS — E7800 Pure hypercholesterolemia, unspecified: Secondary | ICD-10-CM | POA: Insufficient documentation

## 2020-01-30 DIAGNOSIS — Z803 Family history of malignant neoplasm of breast: Secondary | ICD-10-CM | POA: Insufficient documentation

## 2020-01-30 DIAGNOSIS — N6012 Diffuse cystic mastopathy of left breast: Secondary | ICD-10-CM | POA: Insufficient documentation

## 2020-01-30 DIAGNOSIS — Z79899 Other long term (current) drug therapy: Secondary | ICD-10-CM | POA: Insufficient documentation

## 2020-01-30 HISTORY — PX: BREAST LUMPECTOMY WITH RADIOACTIVE SEED LOCALIZATION: SHX6424

## 2020-01-30 LAB — GLUCOSE, CAPILLARY: Glucose-Capillary: 122 mg/dL — ABNORMAL HIGH (ref 70–99)

## 2020-01-30 SURGERY — BREAST LUMPECTOMY WITH RADIOACTIVE SEED LOCALIZATION
Anesthesia: General | Site: Breast | Laterality: Left

## 2020-01-30 MED ORDER — MIDAZOLAM HCL 2 MG/2ML IJ SOLN: 1.0000 mg | INTRAMUSCULAR | Status: DC | PRN

## 2020-01-30 MED ORDER — MIDAZOLAM HCL 5 MG/5ML IJ SOLN
INTRAMUSCULAR | Status: DC | PRN
  Administered 2020-01-30: 2 mg via INTRAVENOUS

## 2020-01-30 MED ORDER — ONDANSETRON HCL 4 MG/2ML IJ SOLN
INTRAMUSCULAR | Status: DC | PRN
  Administered 2020-01-30: 4 mg via INTRAVENOUS

## 2020-01-30 MED ORDER — MIDAZOLAM HCL 2 MG/2ML IJ SOLN
INTRAMUSCULAR | Status: AC
  Filled 2020-01-30: qty 2

## 2020-01-30 MED ORDER — OXYCODONE HCL 5 MG/5ML PO SOLN: 5.0000 mg | Freq: Once | ORAL | Status: DC | PRN

## 2020-01-30 MED ORDER — LIDOCAINE 2% (20 MG/ML) 5 ML SYRINGE
INTRAMUSCULAR | Status: AC
  Filled 2020-01-30: qty 5

## 2020-01-30 MED ORDER — GABAPENTIN 300 MG PO CAPS
300.0000 mg | ORAL_CAPSULE | ORAL | Status: AC
  Administered 2020-01-30: 300 mg via ORAL

## 2020-01-30 MED ORDER — LACTATED RINGERS IV SOLN: INTRAVENOUS | Status: DC

## 2020-01-30 MED ORDER — DEXAMETHASONE SODIUM PHOSPHATE 10 MG/ML IJ SOLN
INTRAMUSCULAR | Status: AC
  Filled 2020-01-30: qty 1

## 2020-01-30 MED ORDER — CHLORHEXIDINE GLUCONATE CLOTH 2 % EX PADS: 6.0000 | MEDICATED_PAD | Freq: Once | CUTANEOUS | Status: DC

## 2020-01-30 MED ORDER — PROPOFOL 10 MG/ML IV BOLUS
INTRAVENOUS | Status: AC
  Filled 2020-01-30: qty 20

## 2020-01-30 MED ORDER — CELECOXIB 400 MG PO CAPS
400.0000 mg | ORAL_CAPSULE | ORAL | Status: AC
  Administered 2020-01-30: 09:00:00 400 mg via ORAL

## 2020-01-30 MED ORDER — EPHEDRINE 5 MG/ML INJ
INTRAVENOUS | Status: AC
  Filled 2020-01-30: qty 10

## 2020-01-30 MED ORDER — CELECOXIB 200 MG PO CAPS
ORAL_CAPSULE | ORAL | Status: AC
  Filled 2020-01-30: qty 2

## 2020-01-30 MED ORDER — CIPROFLOXACIN IN D5W 400 MG/200ML IV SOLN
INTRAVENOUS | Status: AC
  Filled 2020-01-30: qty 200

## 2020-01-30 MED ORDER — PROMETHAZINE HCL 25 MG/ML IJ SOLN: 6.2500 mg | INTRAMUSCULAR | Status: DC | PRN

## 2020-01-30 MED ORDER — PROPOFOL 10 MG/ML IV BOLUS
INTRAVENOUS | Status: DC | PRN
  Administered 2020-01-30: 150 mg via INTRAVENOUS

## 2020-01-30 MED ORDER — CIPROFLOXACIN IN D5W 400 MG/200ML IV SOLN
400.0000 mg | INTRAVENOUS | Status: AC
  Administered 2020-01-30: 400 mg via INTRAVENOUS

## 2020-01-30 MED ORDER — PHENYLEPHRINE 40 MCG/ML (10ML) SYRINGE FOR IV PUSH (FOR BLOOD PRESSURE SUPPORT)
PREFILLED_SYRINGE | INTRAVENOUS | Status: AC
  Filled 2020-01-30: qty 10

## 2020-01-30 MED ORDER — ONDANSETRON HCL 4 MG/2ML IJ SOLN
INTRAMUSCULAR | Status: AC
  Filled 2020-01-30: qty 2

## 2020-01-30 MED ORDER — HYDROMORPHONE HCL 1 MG/ML IJ SOLN: 0.2500 mg | INTRAMUSCULAR | Status: DC | PRN

## 2020-01-30 MED ORDER — ACETAMINOPHEN 500 MG PO TABS
1000.0000 mg | ORAL_TABLET | ORAL | Status: AC
  Administered 2020-01-30: 1000 mg via ORAL

## 2020-01-30 MED ORDER — DEXAMETHASONE SODIUM PHOSPHATE 10 MG/ML IJ SOLN
INTRAMUSCULAR | Status: DC | PRN
  Administered 2020-01-30: 10 mg via INTRAVENOUS

## 2020-01-30 MED ORDER — GABAPENTIN 300 MG PO CAPS
ORAL_CAPSULE | ORAL | Status: AC
  Filled 2020-01-30: qty 1

## 2020-01-30 MED ORDER — FENTANYL CITRATE (PF) 100 MCG/2ML IJ SOLN
INTRAMUSCULAR | Status: AC
  Filled 2020-01-30: qty 2

## 2020-01-30 MED ORDER — LIDOCAINE 2% (20 MG/ML) 5 ML SYRINGE
INTRAMUSCULAR | Status: DC | PRN
  Administered 2020-01-30: 60 mg via INTRAVENOUS

## 2020-01-30 MED ORDER — BUPIVACAINE HCL 0.5 % IJ SOLN
INTRAMUSCULAR | Status: DC | PRN
  Administered 2020-01-30: 20 mL

## 2020-01-30 MED ORDER — FENTANYL CITRATE (PF) 100 MCG/2ML IJ SOLN
INTRAMUSCULAR | Status: DC | PRN
  Administered 2020-01-30 (×2): 25 ug via INTRAVENOUS

## 2020-01-30 MED ORDER — FENTANYL CITRATE (PF) 100 MCG/2ML IJ SOLN: 50.0000 ug | INTRAMUSCULAR | Status: DC | PRN

## 2020-01-30 MED ORDER — ACETAMINOPHEN 500 MG PO TABS
ORAL_TABLET | ORAL | Status: AC
  Filled 2020-01-30: qty 2

## 2020-01-30 MED ORDER — OXYCODONE HCL 5 MG PO TABS: 5.0000 mg | ORAL_TABLET | Freq: Four times a day (QID) | ORAL | 0 refills | Status: DC | PRN

## 2020-01-30 MED ORDER — MEPERIDINE HCL 25 MG/ML IJ SOLN: 6.2500 mg | INTRAMUSCULAR | Status: DC | PRN

## 2020-01-30 MED ORDER — OXYCODONE HCL 5 MG PO TABS: 5.0000 mg | ORAL_TABLET | Freq: Once | ORAL | Status: DC | PRN

## 2020-01-30 SURGICAL SUPPLY — 31 items
BINDER BREAST 3XL (GAUZE/BANDAGES/DRESSINGS) ×2 IMPLANT
BLADE SURG 15 STRL LF DISP TIS (BLADE) ×1 IMPLANT
BLADE SURG 15 STRL SS (BLADE) ×2
CHLORAPREP W/TINT 26 (MISCELLANEOUS) ×2 IMPLANT
COVER BACK TABLE 60X90IN (DRAPES) ×2 IMPLANT
COVER MAYO STAND STRL (DRAPES) ×2 IMPLANT
COVER PROBE W GEL 5X96 (DRAPES) ×2 IMPLANT
DERMABOND ADVANCED (GAUZE/BANDAGES/DRESSINGS) ×1
DERMABOND ADVANCED .7 DNX12 (GAUZE/BANDAGES/DRESSINGS) ×1 IMPLANT
DRAPE LAPAROSCOPIC ABDOMINAL (DRAPES) ×2 IMPLANT
DRAPE UTILITY XL STRL (DRAPES) ×2 IMPLANT
ELECT REM PT RETURN 9FT ADLT (ELECTROSURGICAL) ×2
ELECTRODE REM PT RTRN 9FT ADLT (ELECTROSURGICAL) ×1 IMPLANT
GLOVE SURG SIGNA 7.5 PF LTX (GLOVE) ×2 IMPLANT
GOWN STRL REUS W/ TWL LRG LVL3 (GOWN DISPOSABLE) ×1 IMPLANT
GOWN STRL REUS W/ TWL XL LVL3 (GOWN DISPOSABLE) ×1 IMPLANT
GOWN STRL REUS W/TWL LRG LVL3 (GOWN DISPOSABLE) ×2
GOWN STRL REUS W/TWL XL LVL3 (GOWN DISPOSABLE) ×2
KIT MARKER MARGIN INK (KITS) ×2 IMPLANT
NEEDLE HYPO 25X1 1.5 SAFETY (NEEDLE) ×2 IMPLANT
NS IRRIG 1000ML POUR BTL (IV SOLUTION) ×2 IMPLANT
PACK BASIN DAY SURGERY FS (CUSTOM PROCEDURE TRAY) ×2 IMPLANT
PENCIL SMOKE EVACUATOR (MISCELLANEOUS) ×2 IMPLANT
SLEEVE SCD COMPRESS KNEE MED (MISCELLANEOUS) ×2 IMPLANT
SPONGE LAP 4X18 RFD (DISPOSABLE) ×2 IMPLANT
SUT MNCRL AB 4-0 PS2 18 (SUTURE) ×2 IMPLANT
SUT VIC AB 3-0 SH 27 (SUTURE) ×2
SUT VIC AB 3-0 SH 27X BRD (SUTURE) ×1 IMPLANT
SYR CONTROL 10ML LL (SYRINGE) ×2 IMPLANT
TOWEL GREEN STERILE FF (TOWEL DISPOSABLE) ×2 IMPLANT
TRAY FAXITRON CT DISP (TRAY / TRAY PROCEDURE) ×2 IMPLANT

## 2020-01-30 NOTE — Anesthesia Preprocedure Evaluation (Addendum)
Anesthesia Evaluation  Patient identified by MRN, date of birth, ID band Patient awake    Reviewed: Allergy & Precautions, NPO status , Patient's Chart, lab work & pertinent test results  Airway Mallampati: III  TM Distance: >3 FB Neck ROM: Full    Dental no notable dental hx. (+) Upper Dentures, Lower Dentures, Dental Advisory Given   Pulmonary sleep apnea ,    Pulmonary exam normal breath sounds clear to auscultation       Cardiovascular hypertension, Pt. on medications and Pt. on home beta blockers Normal cardiovascular exam Rhythm:Regular Rate:Normal  EKG - SR   Neuro/Psych Anxiety Depression  Neuromuscular disease negative psych ROS   GI/Hepatic   Endo/Other  diabetes, Well Controlled, Type 2Morbid obesity  Renal/GU      Musculoskeletal  (+) Arthritis , Fibromyalgia -  Abdominal (+) + obese,   Peds  Hematology  (+) anemia ,   Anesthesia Other Findings   Reproductive/Obstetrics                            Lab Results  Component Value Date   WBC 12.1 (H) 08/23/2018   HGB 11.8 (L) 08/23/2018   HCT 36.9 08/23/2018   MCV 89.3 08/23/2018   PLT 185 08/23/2018    Anesthesia Physical  Anesthesia Plan  ASA: III  Anesthesia Plan: General   Post-op Pain Management:    Induction: Intravenous  PONV Risk Score and Plan: 3 and Treatment may vary due to age or medical condition, Ondansetron, Dexamethasone and Midazolam  Airway Management Planned: LMA  Additional Equipment:   Intra-op Plan:   Post-operative Plan: Extubation in OR  Informed Consent: I have reviewed the patients History and Physical, chart, labs and discussed the procedure including the risks, benefits and alternatives for the proposed anesthesia with the patient or authorized representative who has indicated his/her understanding and acceptance.       Plan Discussed with: CRNA  Anesthesia Plan Comments:          Anesthesia Quick Evaluation

## 2020-01-30 NOTE — Op Note (Signed)
LEFT BREAST LUMPECTOMY X 2 WITH RADIOACTIVE SEED LOCALIZATION  Procedure Note  Alicia Coffey 01/30/2020   Pre-op Diagnosis: LEFT BREAST COMPLEX SCLEROSING LESION AND PAPILLARY LESION     Post-op Diagnosis: same  Procedure(s): LEFT BREAST LUMPECTOMY X 2 WITH RADIOACTIVE SEED LOCALIZATION  Surgeon(s): Coralie Keens, MD  Anesthesia: General  Staff:  Circulator: Izora Ribas, RN Scrub Person: Lorenza Burton, CST  Estimated Blood Loss: Minimal               Specimens: sent to path  Indications: This is a 49 year old female with a previous history of lower carcinoma in situ of the left breast.  She has been found to have 2 abnormal areas on recent mammography.  Biopsy showed a complex sclerosing lesion and a abnormal papillary lesion.  The decision was made to proceed with radioactive seed guided lumpectomy x2  Procedure: The patient was brought to the operating room identifies correct patient.  She is placed upon on the operating room table and general anesthesia was induced.  Her left breast was prepped and draped in usual sterile fashion.  Using the neoprobe, I located 1 seed at the 12 o'clock position and the other seed at 3 o'clock position.  I anesthetized the skin around the areola with Marcaine and made incision with a scalpel.  I then dissected down toward the 12:00 seed first with the aid of neoprobe.  This was deep in the breast tissue.  Using the neoprobe I dissected around the breast tissue performed a lumpectomy excising the tissue around the seed.  I then marked the margins with paint.  An x-ray was performed confirming that the radioactive seed and previous biopsy clip were in the specimen.  It was then sent to pathology for evaluation.  I next dissected toward the 3 o'clock position of the breast with the aid of neoprobe located the second seed which is deep in the breast tissue as well.  Using the neoprobe I then stayed around the seed with the cautery and performed the  second lumpectomy.  Once the specimen was removed I again marked it with paint at all margins.  An x-ray was performed confirming that the seed and previous tissue marker were in the specimen.  This was likewise sent to pathology for evaluation.  I achieved hemostasis with the cautery.  I anesthetized the breast tissue further with Marcaine.  I then closed the subcutaneous tissue with interrupted 3-0 Vicryl sutures and closed the skin with a running 4-0 Monocryl.  Dermabond was then applied.  The patient was next laced in a breast binder.  She was then extubated in the operating room and taken in stable condition to the recovery room.  All counts were correct at the end of the procedure.          Coralie Keens   Date: 01/30/2020  Time: 10:54 AM

## 2020-01-30 NOTE — Anesthesia Procedure Notes (Signed)
Procedure Name: LMA Insertion Date/Time: 01/30/2020 10:04 AM Performed by: [place] Atlas Crossland C, CRNA Pre-anesthesia Checklist: Patient identified, Emergency Drugs available, Suction available and Patient being monitored Patient Re-evaluated:Patient Re-evaluated prior to induction Oxygen Delivery Method: Circle system utilized Preoxygenation: Pre-oxygenation with 100% oxygen Induction Type: IV induction Ventilation: Mask ventilation without difficulty LMA: LMA inserted LMA Size: 4.0 Number of attempts: 1 Airway Equipment and Method: Bite block Placement Confirmation: positive ETCO2 Tube secured with: Tape Dental Injury: Teeth and Oropharynx as per pre-operative assessment

## 2020-01-30 NOTE — Transfer of Care (Signed)
Immediate Anesthesia Transfer of Care Note  Patient: Alicia Coffey  Procedure(s) Performed: LEFT BREAST LUMPECTOMY X 2 WITH RADIOACTIVE SEED LOCALIZATION (Left Breast)  Patient Location: PACU  Anesthesia Type:General  Level of Consciousness: awake and drowsy  Airway & Oxygen Therapy: Patient Spontanous Breathing and Patient connected to face mask oxygen  Post-op Assessment: Report given to RN and Post -op Vital signs reviewed and stable  Post vital signs: Reviewed and stable  Last Vitals:  Vitals Value Taken Time  BP 142/100 01/30/20 1102  Temp    Pulse 79 01/30/20 1105  Resp 21 01/30/20 1106  SpO2 100 % 01/30/20 1105  Vitals shown include unvalidated device data.  Last Pain:  Vitals:   01/30/20 0908  TempSrc: Temporal  PainSc: 2       Patients Stated Pain Goal: 2 ([id number])  Complications: No apparent anesthesia complications

## 2020-01-30 NOTE — Discharge Instructions (Signed)
[place] Office Phone Number [phone]  BREAST BIOPSY/ PARTIAL MASTECTOMY: POST OP INSTRUCTIONS  Always review your discharge instruction sheet given to you by the facility where your surgery was performed.  IF YOU HAVE DISABILITY OR FAMILY LEAVE FORMS, YOU MUST BRING THEM TO THE OFFICE FOR PROCESSING.  DO NOT GIVE THEM TO YOUR DOCTOR.  1. A prescription for pain medication may be given to you upon discharge.  Take your pain medication as prescribed, if needed.  If narcotic pain medicine is not needed, then you may take acetaminophen (Tylenol) or ibuprofen (Advil) as needed. 2. Take your usually prescribed medications unless otherwise directed 3. If you need a refill on your pain medication, please contact your pharmacy.  They will contact our office to request authorization.  Prescriptions will not be filled after 5pm or on week-ends. 4. You should eat very light the first 24 hours after surgery, such as soup, crackers, pudding, etc.  Resume your normal diet the day after surgery. 5. Most patients will experience some swelling and bruising in the breast.  Ice packs and a good support bra will help.  Swelling and bruising can take several days to resolve.  6. It is common to experience some constipation if taking pain medication after surgery.  Increasing fluid intake and taking a stool softener will usually help or prevent this problem from occurring.  A mild laxative (Milk of Magnesia or Miralax) should be taken according to package directions if there are no bowel movements after 48 hours. 7. Unless discharge instructions indicate otherwise, you may remove your bandages 24-48 hours after surgery, and you may shower at that time.  You may have steri-strips (small skin tapes) in place directly over the incision.  These strips should be left on the skin for 7-10 days.  If your surgeon used skin glue on the incision, you may shower in 24 hours.  The glue will flake off over the  next 2-3 weeks.  Any sutures or staples will be removed at the office during your follow-up visit. 8. ACTIVITIES:  You may resume regular daily activities (gradually increasing) beginning the next day.  Wearing a good support bra or sports bra minimizes pain and swelling.  You may have sexual intercourse when it is comfortable. a. You may drive when you no longer are taking prescription pain medication, you can comfortably wear a seatbelt, and you can safely maneuver your car and apply brakes. b. RETURN TO WORK:  ______________________________________________________________________________________ 9. You should see your doctor in the office for a follow-up appointment approximately two weeks after your surgery.  Your doctor's nurse will typically make your follow-up appointment when she calls you with your pathology report.  Expect your pathology report 2-3 business days after your surgery.  You may call to check if you do not hear from [place] after three days. 10. OTHER INSTRUCTIONS:OK TO REMOVE BINDER AND SHOWER TOMORROW 11. ICE PACK, TYLENOL, AND ADVIL ALSO FOR PAIN 12. NO VIGOROUS ACTIVITY FOR ONE WEEK _______________________________________________________________________________________________ _____________________________________________________________________________________________________________________________________ _____________________________________________________________________________________________________________________________________ _____________________________________________________________________________________________________________________________________  WHEN TO CALL YOUR DOCTOR: 1. Fever over 101.0 2. Nausea and/or vomiting. 3. Extreme swelling or bruising. 4. Continued bleeding from incision. 5. Increased pain, redness, or drainage from the incision.  The clinic staff is available to answer your questions during regular business hours.  Please don't hesitate  to call and ask to speak to one of the nurses for clinical concerns.  If you have a medical emergency, go to the nearest emergency room or call 911.  A surgeon from [facility] is always on call at the hospital.  For further questions, please visit [email or web address]      No Tylenol before 3:15p No ibuprofen before 5:15p   Post Anesthesia Home Care Instructions  Activity: Get plenty of rest for the remainder of the day. A responsible individual must stay with you for 24 hours following the procedure.  For the next 24 hours, DO NOT: -Drive a car -Paediatric nurse -Drink alcoholic beverages -Take any medication unless instructed by your physician -Make any legal decisions or sign important papers.  Meals: Start with liquid foods such as gelatin or soup. Progress to regular foods as tolerated. Avoid greasy, spicy, heavy foods. If nausea and/or vomiting occur, drink only clear liquids until the nausea and/or vomiting subsides. Call your physician if vomiting continues.  Special Instructions/Symptoms: Your throat may feel dry or sore from the anesthesia or the breathing tube placed in your throat during surgery. If this causes discomfort, gargle with warm salt water. The discomfort should disappear within 24 hours.  If you had a scopolamine patch placed behind your ear for the management of post- operative nausea and/or vomiting:  1. The medication in the patch is effective for 72 hours, after which it should be removed.  Wrap patch in a tissue and discard in the trash. Wash hands thoroughly with soap and water. 2. You may remove the patch earlier than 72 hours if you experience unpleasant side effects which may include dry mouth, dizziness or visual disturbances. 3. Avoid touching the patch. Wash your hands with soap and water after contact with the patch.

## 2020-01-30 NOTE — Interval H&P Note (Signed)
History and Physical Interval Note: no change in H and P  01/30/2020 9:40 AM  Alicia Coffey  has presented today for surgery, with the diagnosis of LEFT BREAST COMPLEX SCLEROSING LESION AND PAPILLARY LESION.  The various methods of treatment have been discussed with the patient and family. After consideration of risks, benefits and other options for treatment, the patient has consented to  Procedure(s) with comments: LEFT BREAST LUMPECTOMY X 2 WITH RADIOACTIVE SEED LOCALIZATION (Left) - LMA as a surgical intervention.  The patient's history has been reviewed, patient examined, no change in status, stable for surgery.  I have reviewed the patient's chart and labs.  Questions were answered to the patient's satisfaction.     Coralie Keens

## 2020-01-30 NOTE — Anesthesia Postprocedure Evaluation (Signed)
Anesthesia Post Note  Patient: Alicia Coffey  Procedure(s) Performed: LEFT BREAST LUMPECTOMY X 2 WITH RADIOACTIVE SEED LOCALIZATION (Left Breast)     Patient location during evaluation: PACU Anesthesia Type: General Level of consciousness: awake and alert Pain management: pain level controlled Vital Signs Assessment: post-procedure vital signs reviewed and stable Respiratory status: spontaneous breathing, nonlabored ventilation and respiratory function stable Cardiovascular status: blood pressure returned to baseline and stable Postop Assessment: no apparent nausea or vomiting Anesthetic complications: no    Last Vitals:  Vitals:   01/30/20 1145 01/30/20 1230  BP: 127/85 129/83  Pulse: 78 77  Resp: 18 16  Temp:  36.5 C  SpO2: 99% 98%    Last Pain:  Vitals:   01/30/20 1145  TempSrc:   PainSc: 0-No pain                 Lynda Rainwater

## 2020-01-31 ENCOUNTER — Encounter: Payer: Self-pay | Admitting: *Deleted

## 2020-01-31 LAB — SURGICAL PATHOLOGY

## 2020-02-09 ENCOUNTER — Ambulatory Visit: Payer: Medicaid Other | Admitting: Podiatry

## 2020-02-09 ENCOUNTER — Encounter: Payer: Self-pay | Admitting: Podiatry

## 2020-02-09 ENCOUNTER — Other Ambulatory Visit: Payer: Self-pay

## 2020-02-09 VITALS — Temp 97.9°F

## 2020-02-09 DIAGNOSIS — B351 Tinea unguium: Secondary | ICD-10-CM

## 2020-02-09 DIAGNOSIS — M79676 Pain in unspecified toe(s): Secondary | ICD-10-CM | POA: Diagnosis not present

## 2020-02-09 DIAGNOSIS — E1142 Type 2 diabetes mellitus with diabetic polyneuropathy: Secondary | ICD-10-CM

## 2020-02-09 NOTE — Patient Instructions (Signed)
Diabetes Mellitus and Foot Care Foot care is an important part of your health, especially when you have diabetes. Diabetes may cause you to have problems because of poor blood flow (circulation) to your feet and legs, which can cause your skin to:  Become thinner and drier.  Break more easily.  Heal more slowly.  Peel and crack. You may also have nerve damage (neuropathy) in your legs and feet, causing decreased feeling in them. This means that you may not notice minor injuries to your feet that could lead to more serious problems. Noticing and addressing any potential problems early is the best way to prevent future foot problems. How to care for your feet Foot hygiene  Wash your feet daily with warm water and mild soap. Do not use hot water. Then, pat your feet and the areas between your toes until they are completely dry. Do not soak your feet as this can dry your skin.  Trim your toenails straight across. Do not dig under them or around the cuticle. File the edges of your nails with an emery board or nail file.  Apply a moisturizing lotion or petroleum jelly to the skin on your feet and to dry, brittle toenails. Use lotion that does not contain alcohol and is unscented. Do not apply lotion between your toes. Shoes and socks  Wear clean socks or stockings every day. Make sure they are not too tight. Do not wear knee-high stockings since they may decrease blood flow to your legs.  Wear shoes that fit properly and have enough cushioning. Always look in your shoes before you put them on to be sure there are no objects inside.  To break in new shoes, wear them for just a few hours a day. This prevents injuries on your feet. Wounds, scrapes, corns, and calluses  Check your feet daily for blisters, cuts, bruises, sores, and redness. If you cannot see the bottom of your feet, use a mirror or ask someone for help.  Do not cut corns or calluses or try to remove them with medicine.  If you  find a minor scrape, cut, or break in the skin on your feet, keep it and the skin around it clean and dry. You may clean these areas with mild soap and water. Do not clean the area with peroxide, alcohol, or iodine.  If you have a wound, scrape, corn, or callus on your foot, look at it several times a day to make sure it is healing and not infected. Check for: ? Redness, swelling, or pain. ? Fluid or blood. ? Warmth. ? Pus or a bad smell. General instructions  Do not cross your legs. This may decrease blood flow to your feet.  Do not use heating pads or hot water bottles on your feet. They may burn your skin. If you have lost feeling in your feet or legs, you may not know this is happening until it is too late.  Protect your feet from hot and cold by wearing shoes, such as at the beach or on hot pavement.  Schedule a complete foot exam at least once a year (annually) or more often if you have foot problems. If you have foot problems, report any cuts, sores, or bruises to your health care provider immediately. Contact a health care provider if:  You have a medical condition that increases your risk of infection and you have any cuts, sores, or bruises on your feet.  You have an injury that is not   healing.  You have redness on your legs or feet.  You feel burning or tingling in your legs or feet.  You have pain or cramps in your legs and feet.  Your legs or feet are numb.  Your feet always feel cold.  You have pain around a toenail. Get help right away if:  You have a wound, scrape, corn, or callus on your foot and: ? You have pain, swelling, or redness that gets worse. ? You have fluid or blood coming from the wound, scrape, corn, or callus. ? Your wound, scrape, corn, or callus feels warm to the touch. ? You have pus or a bad smell coming from the wound, scrape, corn, or callus. ? You have a fever. ? You have a red line going up your leg. Summary  Check your feet every day  for cuts, sores, red spots, swelling, and blisters.  Moisturize feet and legs daily.  Wear shoes that fit properly and have enough cushioning.  If you have foot problems, report any cuts, sores, or bruises to your health care provider immediately.  Schedule a complete foot exam at least once a year (annually) or more often if you have foot problems. This information is not intended to replace advice given to you by your health care provider. Make sure you discuss any questions you have with your health care provider. Document Revised: 06/21/2019 Document Reviewed: 10/30/2016 Elsevier Patient Education  2020 Elsevier Inc.  

## 2020-02-13 NOTE — Progress Notes (Addendum)
Subjective: ALYLAH TRAMONTANA presents today for follow up of preventative diabetic foot care, at risk foot care. Pt has h/o NIDDM with chronic kidney disease and painful mycotic nails b/l that are difficult to trim. Pain interferes with ambulation. Aggravating factors include wearing enclosed shoe gear. Pain is relieved with periodic professional debridement.   Ms. Demeester recently underwent lumpectomy of left breast for breast cancer.  Allergies  Allergen Reactions  . Penicillins Other (See Comments)    PATIENT HAS HAD A PCN REACTION WITH IMMEDIATE RASH, FACIAL/TONGUE/THROAT SWELLING, SOB, OR LIGHTHEADEDNESS WITH HYPOTENSION:  #  #  YES  #  #  Has patient had a PCN reaction causing severe rash involving mucus membranes or skin necrosis: No Has patient had a PCN reaction that required hospitalization No Has patient had a PCN reaction occurring within the last 10 years: No   . Other Hives, Itching and Rash    Powder in gloves Arthropod Insect     Objective: Vitals:   02/09/20 1533  Temp: 97.9 F (36.6 C)    Pt 49 y.o. year old female  in NAD. AAO x 3.   Vascular Examination:  Capillary refill time to digits immediate b/l. Palpable DP pulses b/l. Faintly palpable PT pulses b/l. Pedal hair absent b/l Skin temperature gradient within normal limits b/l. No edema noted b/l.  Dermatological Examination: Pedal skin with normal turgor, texture and tone bilaterally. No open wounds bilaterally. No interdigital macerations bilaterally. Toenails 1-5 b/l elongated, dystrophic, thickened, crumbly with subungual debris and tenderness to dorsal palpation. Hyperkeratotic lesion(s) L 2nd toe and R 2nd toe.  No erythema, no edema, no drainage, no flocculence.  Musculoskeletal: Normal muscle strength 5/5 to all lower extremity muscle groups bilaterally. No pain crepitus or joint limitation noted with ROM b/l. Hammertoes noted to the 2-5 bilaterally.  Neurological: Protective sensation decreased with 10  gram monofilament b/l. Vibratory sensation diminished b/l. Babinski reflex negative b/l. Clonus negative b/l.  Assessment: 1. Pain due to onychomycosis of nail   2. Diabetic peripheral neuropathy associated with type 2 diabetes mellitus ([place])    Plan: -No new findings. No new orders. -Medicaid ABN signed for this year. Copy given to patient on today's visit and copy placed in patient chart. Patient declines trimming of calluses on today. -Dispensed toe tunnels for b/l 2nd digits for daily protection. -Toenails 1-5 b/l were debrided in length and girth with sterile nail nippers and dremel without iatrogenic bleeding.  -Patient to continue soft, supportive shoe gear daily. -Patient to report any pedal injuries to medical professional immediately. -Patient/POA to call should there be question/concern in the interim.  Return in about 10 weeks (around 04/19/2020) for diabetic nail and callus trim.  Marzetta Board, DPM

## 2020-03-21 ENCOUNTER — Other Ambulatory Visit: Payer: Self-pay | Admitting: Nurse Practitioner

## 2020-03-21 DIAGNOSIS — Z1231 Encounter for screening mammogram for malignant neoplasm of breast: Secondary | ICD-10-CM

## 2020-04-23 ENCOUNTER — Other Ambulatory Visit: Payer: Self-pay

## 2020-04-23 ENCOUNTER — Ambulatory Visit: Payer: Medicaid Other | Admitting: Podiatry

## 2020-04-23 ENCOUNTER — Encounter: Payer: Self-pay | Admitting: Podiatry

## 2020-04-23 DIAGNOSIS — M79609 Pain in unspecified limb: Secondary | ICD-10-CM

## 2020-04-23 DIAGNOSIS — M79676 Pain in unspecified toe(s): Secondary | ICD-10-CM | POA: Diagnosis not present

## 2020-04-23 DIAGNOSIS — B351 Tinea unguium: Secondary | ICD-10-CM | POA: Diagnosis not present

## 2020-04-23 DIAGNOSIS — M2042 Other hammer toe(s) (acquired), left foot: Secondary | ICD-10-CM

## 2020-04-23 DIAGNOSIS — E1142 Type 2 diabetes mellitus with diabetic polyneuropathy: Secondary | ICD-10-CM

## 2020-04-23 DIAGNOSIS — M2041 Other hammer toe(s) (acquired), right foot: Secondary | ICD-10-CM

## 2020-04-23 DIAGNOSIS — L84 Corns and callosities: Secondary | ICD-10-CM

## 2020-04-27 NOTE — Progress Notes (Signed)
Subjective: Alicia Coffey presents today for follow up of preventative diabetic foot care, at risk foot care. Pt has h/o NIDDM with chronic kidney disease and painful mycotic nails b/l that are difficult to trim. Pain interferes with ambulation. Aggravating factors include wearing enclosed shoe gear. Pain is relieved with periodic professional debridement.   She relates she has had some bites on her legs and arms. Her doctor called in a cream for her.   She relates no new pedal concerns on today's visit.  Allergies  Allergen Reactions  . Penicillins Other (See Comments)    PATIENT HAS HAD A PCN REACTION WITH IMMEDIATE RASH, FACIAL/TONGUE/THROAT SWELLING, SOB, OR LIGHTHEADEDNESS WITH HYPOTENSION:  #  #  YES  #  #  Has patient had a PCN reaction causing severe rash involving mucus membranes or skin necrosis: No Has patient had a PCN reaction that required hospitalization No Has patient had a PCN reaction occurring within the last 10 years: No   . Other Hives, Itching and Rash    Powder in gloves Arthropod Insect     Objective: There were no vitals filed for this visit.  Pt 49 y.o. year old female  in NAD. AAO x 3.   Vascular Examination:  Capillary refill time to digits immediate b/l. Palpable DP pulses b/l. Faintly palpable PT pulses b/l. Pedal hair absent b/l Skin temperature gradient within normal limits b/l. No edema noted b/l.  Dermatological Examination: Pedal skin with normal turgor, texture and tone bilaterally. No open wounds bilaterally. No interdigital macerations bilaterally. Toenails 1-5 b/l elongated, dystrophic, thickened, crumbly with subungual debris and tenderness to dorsal palpation. Hyperkeratotic lesion(s) distal aspect L 2nd toe and R 2nd toe and dorsal DIPJ 4th digit b/l.  No erythema, no edema, no drainage, no flocculence.  Musculoskeletal: Normal muscle strength 5/5 to all lower extremity muscle groups bilaterally. No pain crepitus or joint limitation noted  with ROM b/l. Hammertoes noted to the 2-5 bilaterally.  Neurological: Protective sensation decreased with 10 gram monofilament b/l. Vibratory sensation diminished b/l. Babinski reflex negative b/l. Clonus negative b/l.  Assessment: 1. Pain due to onychomycosis of nail   2. Corns   3. Acquired hammertoes of both feet   4. Diabetic peripheral neuropathy associated with type 2 diabetes mellitus ([place])    Plan: -Medicaid ABN signed for this year. Copy given to patient on today's visit and copy placed in patient chart. Patient declines trimming of lesions on today's visit. -Continue toe tunnels for b/l 2nd digits for daily protection. -Toenails 1-5 b/l were debrided in length and girth with sterile nail nippers and dremel without iatrogenic bleeding.  -Patient to continue soft, supportive shoe gear daily. -Patient to report any pedal injuries to medical professional immediately. -Patient/POA to call should there be question/concern in the interim.  Return in about 9 weeks (around 06/25/2020) for diabetic nail trim.  Marzetta Board, DPM

## 2020-04-29 ENCOUNTER — Ambulatory Visit
Admission: RE | Admit: 2020-04-29 | Discharge: 2020-04-29 | Disposition: A | Discharge status: Home / Self Care | Payer: Medicaid Other | Source: Ambulatory Visit | Attending: Nurse Practitioner | Admitting: Nurse Practitioner

## 2020-04-29 ENCOUNTER — Other Ambulatory Visit: Payer: Self-pay

## 2020-04-29 DIAGNOSIS — Z1231 Encounter for screening mammogram for malignant neoplasm of breast: Secondary | ICD-10-CM

## 2020-05-27 ENCOUNTER — Other Ambulatory Visit: Payer: Self-pay | Admitting: Nurse Practitioner

## 2020-05-27 DIAGNOSIS — Z1231 Encounter for screening mammogram for malignant neoplasm of breast: Secondary | ICD-10-CM

## 2020-05-29 ENCOUNTER — Other Ambulatory Visit: Payer: Self-pay | Admitting: Nurse Practitioner

## 2020-05-29 DIAGNOSIS — M79605 Pain in left leg: Secondary | ICD-10-CM

## 2020-05-29 DIAGNOSIS — N644 Mastodynia: Secondary | ICD-10-CM

## 2020-06-03 ENCOUNTER — Ambulatory Visit
Admission: RE | Admit: 2020-06-03 | Discharge: 2020-06-03 | Disposition: A | Discharge status: Home / Self Care | Payer: Medicaid Other | Source: Ambulatory Visit | Attending: Nurse Practitioner | Admitting: Nurse Practitioner

## 2020-06-03 DIAGNOSIS — M79605 Pain in left leg: Secondary | ICD-10-CM

## 2020-06-14 ENCOUNTER — Ambulatory Visit
Admission: RE | Admit: 2020-06-14 | Discharge: 2020-06-14 | Disposition: A | Discharge status: Home / Self Care | Payer: Medicaid Other | Source: Ambulatory Visit | Attending: Nurse Practitioner | Admitting: Nurse Practitioner

## 2020-06-14 ENCOUNTER — Other Ambulatory Visit: Payer: Self-pay

## 2020-06-14 DIAGNOSIS — N644 Mastodynia: Secondary | ICD-10-CM

## 2020-07-31 ENCOUNTER — Encounter: Payer: Self-pay | Admitting: Podiatry

## 2020-07-31 ENCOUNTER — Ambulatory Visit (INDEPENDENT_AMBULATORY_CARE_PROVIDER_SITE_OTHER): Payer: Medicaid Other | Admitting: Podiatry

## 2020-07-31 ENCOUNTER — Other Ambulatory Visit: Payer: Self-pay

## 2020-07-31 DIAGNOSIS — M79609 Pain in unspecified limb: Secondary | ICD-10-CM

## 2020-07-31 DIAGNOSIS — E0843 Diabetes mellitus due to underlying condition with diabetic autonomic (poly)neuropathy: Secondary | ICD-10-CM | POA: Diagnosis not present

## 2020-07-31 DIAGNOSIS — B351 Tinea unguium: Secondary | ICD-10-CM

## 2020-07-31 DIAGNOSIS — M2041 Other hammer toe(s) (acquired), right foot: Secondary | ICD-10-CM

## 2020-07-31 DIAGNOSIS — Q6650 Congenital pes planus, unspecified foot: Secondary | ICD-10-CM | POA: Diagnosis not present

## 2020-07-31 DIAGNOSIS — M5137 Other intervertebral disc degeneration, lumbosacral region: Secondary | ICD-10-CM | POA: Insufficient documentation

## 2020-07-31 DIAGNOSIS — M2042 Other hammer toe(s) (acquired), left foot: Secondary | ICD-10-CM

## 2020-07-31 NOTE — Progress Notes (Addendum)
This patient returns to my office for at risk foot care.  This patient requires this care by a professional since this patient will be at risk due to having diabetes and neuropathy.  This patient is unable to cut nails herself since the patient cannot reach her nails.These nails are painful walking and wearing shoes.  This patient presents for at risk foot care today.  General Appearance  Alert, conversant and in no acute stress.  Vascular  Dorsalis pedis   pulses are palpable  Bilaterally.  Posterior tibial pulses are weakly palpable.  Capillary return is within normal limits  bilaterally. Temperature is within normal limits  bilaterally.  Neurologic  Senn-Weinstein monofilament wire test decreased bilaterally. Muscle power within normal limits bilaterally.  Nails Thick disfigured discolored nails with subungual debris  from hallux to fifth toes bilaterally. No evidence of bacterial infection or drainage bilaterally.  Orthopedic  No limitations of motion  feet .  No crepitus or effusions noted.  No bony pathology or digital deformities noted.  Hammer toes  B/L.  Pes planus.  Skin  normotropic skin with no porokeratosis noted bilaterally.  No signs of infections or ulcers noted.     Onychomycosis  Pain in right toes  Pain in left toes  Consent was obtained for treatment procedures.   Mechanical debridement of nails 1-5  bilaterally performed with a nail nipper.  Filed with dremel without incident.    Return office visit   3 months                   Told patient to return for periodic foot care and evaluation due to potential at risk complications.  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   GAM Gardiner Barefoot DPM

## 2020-10-14 ENCOUNTER — Other Ambulatory Visit: Payer: Self-pay

## 2020-10-14 ENCOUNTER — Ambulatory Visit: Payer: Medicaid Other | Admitting: Podiatry

## 2020-10-14 ENCOUNTER — Encounter: Payer: Self-pay | Admitting: Podiatry

## 2020-10-14 DIAGNOSIS — M2041 Other hammer toe(s) (acquired), right foot: Secondary | ICD-10-CM

## 2020-10-14 DIAGNOSIS — B351 Tinea unguium: Secondary | ICD-10-CM

## 2020-10-14 DIAGNOSIS — M2042 Other hammer toe(s) (acquired), left foot: Secondary | ICD-10-CM

## 2020-10-14 DIAGNOSIS — E1142 Type 2 diabetes mellitus with diabetic polyneuropathy: Secondary | ICD-10-CM

## 2020-10-14 DIAGNOSIS — M79609 Pain in unspecified limb: Secondary | ICD-10-CM

## 2020-10-14 NOTE — Progress Notes (Signed)
Subjective: FAVOUR ALESHIRE presents today for follow up of preventative diabetic foot care, at risk foot care. Pt has h/o NIDDM with chronic kidney disease and painful mycotic nails b/l that are difficult to trim. Pain interferes with ambulation. Aggravating factors include wearing enclosed shoe gear. Pain is relieved with periodic professional debridement.   She did not check her blood glucose this moring.  She relates no new pedal concerns on today's visit.  Allergies  Allergen Reactions  . Penicillins Other (See Comments)    PATIENT HAS HAD A PCN REACTION WITH IMMEDIATE RASH, FACIAL/TONGUE/THROAT SWELLING, SOB, OR LIGHTHEADEDNESS WITH HYPOTENSION:  #  #  YES  #  #  Has patient had a PCN reaction causing severe rash involving mucus membranes or skin necrosis: No Has patient had a PCN reaction that required hospitalization No Has patient had a PCN reaction occurring within the last 10 years: No   . Other Hives, Itching and Rash    Powder in gloves Arthropod Insect     Objective: There were no vitals filed for this visit.  Pt 50 y.o. year old female  in NAD. AAO x 3.   Vascular Examination:  Capillary refill time to digits immediate b/l. Palpable DP pulses b/l. Faintly palpable PT pulses b/l. Pedal hair absent b/l Skin temperature gradient within normal limits b/l. No edema noted b/l.  Dermatological Examination: Pedal skin with normal turgor, texture and tone bilaterally. No open wounds bilaterally. No interdigital macerations bilaterally. Toenails 1-5 b/l elongated, dystrophic, thickened, crumbly with subungual debris and tenderness to dorsal palpation. Hyperkeratotic lesion(s) distal aspect L 2nd toe and R 2nd toe and dorsal DIPJ 4th digit b/l.  No erythema, no edema, no drainage, no flocculence.  Musculoskeletal: Normal muscle strength 5/5 to all lower extremity muscle groups bilaterally. No pain crepitus or joint limitation noted with ROM b/l. Hammertoes noted to the 2-5  bilaterally.  Neurological: Protective sensation decreased with 10 gram monofilament b/l. Vibratory sensation diminished b/l. Babinski reflex negative b/l. Clonus negative b/l.  Assessment: 1. Pain due to onychomycosis of nail   2. Acquired hammertoes of both feet   3. Diabetic peripheral neuropathy associated with type 2 diabetes mellitus ([facility])    Plan: -No new orders on today's visit. -Continue diabetic foot care principles daily. -Continue toe tunnels for b/l 2nd digits for daily protection. -Toenails 1-5 b/l were debrided in length and girth with sterile nail nippers and dremel without iatrogenic bleeding.  -Patient to continue soft, supportive shoe gear daily. -Patient to report any pedal injuries to medical professional immediately. -Patient/POA to call should there be question/concern in the interim.  Return in about 3 months (around 01/12/2021) for diabetic foot care.  Freddie Breech, DPM

## 2020-11-01 ENCOUNTER — Ambulatory Visit: Payer: Medicaid Other | Admitting: Podiatry

## 2021-01-13 ENCOUNTER — Encounter: Payer: Self-pay | Admitting: Podiatry

## 2021-01-13 ENCOUNTER — Other Ambulatory Visit: Payer: Self-pay

## 2021-01-13 ENCOUNTER — Ambulatory Visit: Payer: Medicaid Other | Admitting: Podiatry

## 2021-01-13 DIAGNOSIS — S90221A Contusion of right lesser toe(s) with damage to nail, initial encounter: Secondary | ICD-10-CM

## 2021-01-13 DIAGNOSIS — M79609 Pain in unspecified limb: Secondary | ICD-10-CM

## 2021-01-13 DIAGNOSIS — B351 Tinea unguium: Secondary | ICD-10-CM

## 2021-01-13 DIAGNOSIS — E1142 Type 2 diabetes mellitus with diabetic polyneuropathy: Secondary | ICD-10-CM

## 2021-01-13 DIAGNOSIS — M2041 Other hammer toe(s) (acquired), right foot: Secondary | ICD-10-CM

## 2021-01-13 DIAGNOSIS — M2042 Other hammer toe(s) (acquired), left foot: Secondary | ICD-10-CM

## 2021-01-19 NOTE — Progress Notes (Signed)
Subjective: Alicia Coffey presents today for follow up of preventative diabetic foot care, at risk foot care. Pt has h/o NIDDM with chronic kidney disease and painful mycotic nails b/l that are difficult to trim. Pain interferes with ambulation. Aggravating factors include wearing enclosed shoe gear. Pain is relieved with periodic professional debridement.   She is concerned about black spot of the 2nd toe on the right foot. Relates no episodes of trauma. Unsure when it appeared and it is not painful.  Allergies  Allergen Reactions  . Penicillins Other (See Comments)    PATIENT HAS HAD A PCN REACTION WITH IMMEDIATE RASH, FACIAL/TONGUE/THROAT SWELLING, SOB, OR LIGHTHEADEDNESS WITH HYPOTENSION:  #  #  YES  #  #  Has patient had a PCN reaction causing severe rash involving mucus membranes or skin necrosis: No Has patient had a PCN reaction that required hospitalization No Has patient had a PCN reaction occurring within the last 10 years: No   . Other Hives, Itching and Rash    Powder in gloves Arthropod Insect     Objective: There were no vitals filed for this visit.  Pt 50 y.o. year old female  in NAD. AAO x 3.   Vascular Examination:  Capillary refill time to digits immediate b/l. Palpable DP pulses b/l. Faintly palpable PT pulses b/l. Pedal hair absent b/l Skin temperature gradient within normal limits b/l. No edema noted b/l.  Dermatological Examination: Pedal skin with normal turgor, texture and tone bilaterally. No open wounds bilaterally. No interdigital macerations bilaterally. Toenails 1-5 b/l elongated, dystrophic, thickened, crumbly with subungual debris and tenderness to dorsal palpation. No dorsal corns noted on today's visit.  She has dried heme under toenail of right 2nd toe. No onycholysis, no erythema, no edema, no drainage, no fluctuance.  Musculoskeletal: Normal muscle strength 5/5 to all lower extremity muscle groups bilaterally. No pain crepitus or joint limitation  noted with ROM b/l. Hammertoes noted to the 2-5 bilaterally.  Neurological: Protective sensation decreased with 10 gram monofilament b/l. Vibratory sensation diminished b/l. Babinski reflex negative b/l. Clonus negative b/l.  Assessment: 1. Pain due to onychomycosis of nail   2. Acquired hammertoes of both feet   3. Diabetic peripheral neuropathy associated with type 2 diabetes mellitus ([place])     Plan: -No new orders on today's visit. -Continue diabetic foot care principles daily. -Continue toe tunnels for b/l 2nd digits for hammertoes daily protection. -Toenails 1-5 b/l were debrided in length and girth with sterile nail nippers and dremel without iatrogenic bleeding.  -Subungual hematoma right 2nd toe asymptomatic. Monitor. -Patient to continue soft, supportive shoe gear daily. -Patient to report any pedal injuries to medical professional immediately. -Patient/POA to call should there be question/concern in the interim.  Return in about 3 months (around 04/14/2021).  Marzetta Board, DPM

## 2021-03-24 ENCOUNTER — Other Ambulatory Visit: Payer: Self-pay

## 2021-03-24 ENCOUNTER — Ambulatory Visit: Payer: Medicaid Other | Admitting: Adult Health

## 2021-03-24 ENCOUNTER — Encounter: Payer: Self-pay | Admitting: Adult Health

## 2021-03-24 VITALS — BP 144/79 | HR 72 | Ht 63.0 in | Wt 248.5 lb

## 2021-03-24 DIAGNOSIS — R232 Flushing: Secondary | ICD-10-CM

## 2021-03-24 DIAGNOSIS — D0501 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: Secondary | ICD-10-CM

## 2021-03-24 DIAGNOSIS — R0609 Other forms of dyspnea: Secondary | ICD-10-CM

## 2021-03-24 DIAGNOSIS — R531 Weakness: Secondary | ICD-10-CM

## 2021-03-24 DIAGNOSIS — R0600 Dyspnea, unspecified: Secondary | ICD-10-CM

## 2021-03-24 DIAGNOSIS — N951 Menopausal and female climacteric states: Secondary | ICD-10-CM

## 2021-03-24 NOTE — Patient Instructions (Signed)
https://www.womenshealth.gov/menopause/menopause-basics"> https://www.clinicalkey.com">  Menopause Menopause is the normal time of a woman's life when menstrual periods stop completely. It marks the natural end to a woman's ability to become pregnant. It can be defined as the absence of a menstrual period for 12 months without another medical cause. The transition to menopause (perimenopause) most often happens between the ages of 45 and 55, and can last for many years. During perimenopause, hormone levels change in your body, which can cause symptoms and affect your health. Menopause may increase your risk for: Weakened bones (osteoporosis), which causes fractures. Depression. Hardening and narrowing of the arteries (atherosclerosis), which can cause heart attacks and strokes. What are the causes? This condition is usually caused by a natural change in hormone levels that happens as you get older. The condition may also be caused by changes that are not natural, including: Surgery to remove both ovaries (surgical menopause). Side effects from some medicines, such as chemotherapy used to treat cancer (chemical menopause). What increases the risk? This condition is more likely to start at an earlier age if you have certain medical conditions or have undergone treatments, including: A tumor of the pituitary gland in the brain. A disease that affects the ovaries and hormones. Certain cancer treatments, such as chemotherapy or hormone therapy, or radiation therapy on the pelvis. Heavy smoking and excessive alcohol use. Family history of early menopause. This condition is also more likely to develop earlier in women who are verythin. What are the signs or symptoms? Symptoms of this condition include: Hot flashes. Irregular menstrual periods. Night sweats. Changes in feelings about sex. This could be a decrease in sex drive or an increased discomfort around your sexuality. Vaginal dryness and  thinning of the vaginal walls. This may cause painful sex. Dryness of the skin and development of wrinkles. Headaches. Problems sleeping (insomnia). Mood swings or irritability. Memory problems. Weight gain. Hair growth on the face and chest. Bladder infections or problems with urinating. How is this diagnosed? This condition is diagnosed based on your medical history, a physical exam, your age, your menstrual history, and your symptoms. Hormone tests may also bedone. How is this treated? In some cases, no treatment is needed. You and your health care provider should make a decision together about whether treatment is necessary. Treatment will be based on your individual condition and preferences. Treatment for this condition focuses on managing symptoms. Treatment may include: Menopausal hormone therapy (MHT). Medicines to treat specific symptoms or complications. Acupuncture. Vitamin or herbal supplements. Before starting treatment, make sure to let your health care provider know if you have a personal or family history of these conditions: Heart disease. Breast cancer. Blood clots. Diabetes. Osteoporosis. Follow these instructions at home: Lifestyle Do not use any products that contain nicotine or tobacco, such as cigarettes, e-cigarettes, and chewing tobacco. If you need help quitting, ask your health care provider. Get at least 30 minutes of physical activity on 5 or more days each week. Avoid alcoholic and caffeinated beverages, as well as spicy foods. This may help prevent hot flashes. Get 7-8 hours of sleep each night. If you have hot flashes, try: Dressing in layers. Avoiding things that may trigger hot flashes, such as spicy food, warm places, or stress. Taking slow, deep breaths when a hot flash starts. Keeping a fan in your home and office. Find ways to manage stress, such as deep breathing, meditation, or journaling. Consider going to group therapy with other women who  are having menopause symptoms. Ask your   health care provider about recommended group therapy meetings. Eating and drinking  Eat a healthy, balanced diet that contains whole grains, lean protein, low-fat dairy, and plenty of fruits and vegetables. Your health care provider may recommend adding more soy to your diet. Foods that contain soy include tofu, tempeh, and soy milk. Eat plenty of foods that contain calcium and vitamin D for bone health. Items that are rich in calcium include low-fat milk, yogurt, beans, almonds, sardines, broccoli, and kale.  Medicines Take over-the-counter and prescription medicines only as told by your health care provider. Talk with your health care provider before starting any herbal supplements. If prescribed, take vitamins and supplements as told by your health care provider. General instructions  Keep track of your menstrual periods, including: When they occur. How heavy they are and how long they last. How much time passes between periods. Keep track of your symptoms, noting when they start, how often you have them, and how long they last. Use vaginal lubricants or moisturizers to help with vaginal dryness and improve comfort during sex. Keep all follow-up visits. This is important. This includes any group therapy or counseling.  Contact a health care provider if: You are still having menstrual periods after age 55. You have pain during sex. You have not had a period for 12 months and you develop vaginal bleeding. Get help right away if you have: Severe depression. Excessive vaginal bleeding. Pain when you urinate. A fast or irregular heartbeat (palpitations). Severe headaches. Abdominal pain or severe indigestion. Summary Menopause is a normal time of life when menstrual periods stop completely. It is usually defined as the absence of a menstrual period for 12 months without another medical cause. The transition to menopause (perimenopause) most often  happens between the ages of 45 and 55 and can last for several years. Symptoms can be managed through medicines, lifestyle changes, and complementary therapies such as acupuncture. Eat a balanced diet that is rich in nutrients to promote bone health and heart health and to manage symptoms during menopause. This information is not intended to replace advice given to you by your health care provider. Make sure you discuss any questions you have with your healthcare provider. Document Revised: 06/28/2020 Document Reviewed: 03/14/2020 Elsevier Patient Education  2022 Elsevier Inc.  

## 2021-03-24 NOTE — Progress Notes (Signed)
  Subjective:     Patient ID: Alicia Coffey, female   DOB: [birth year], 50 y.o.   MRN: [id number]  HPI Alicia Coffey is a 50 year old black female,single, G0P0, in complaining of sweating, hot flashes and irregular periods,feels weak and short of breath with exertion. She had lobular carcinoma in situ right breast. PCP is [name] NP.  Review of Systems +sweats +hot flashes +irregular periods +weak feeling +shortness of breath with exertion Reviewed past medical,surgical, social and family history. Reviewed medications and allergies.     Objective:   Physical Exam BP (!) 144/79 (BP Location: Right Arm, Patient Position: Sitting, Cuff Size: Normal)   Pulse 72   Ht 5\' 3"  (1.6 m)   Wt 248 lb 8 oz (112.7 kg)   LMP 01/27/2021 (Approximate)   BMI 44.02 kg/m  Skin warm and dry. Neck: mid line trachea, normal thyroid, good ROM, no lymphadenopathy noted. Lungs: clear to ausculation bilaterally. Cardiovascular: regular rate and rhythm.  AA is 0  Fall risk is low, does use cane Depression screen [facility] 2/9 03/24/2021 03/24/2021 03/17/2016  Decreased Interest 1 0 0  Down, Depressed, Hopeless 1 1 0  PHQ - 2 Score 2 1 0  Altered sleeping 1 - -  Tired, decreased energy 3 - -  Feeling bad or failure about yourself  0 - -  Trouble concentrating 1 - -  Moving slowly or fidgety/restless 0 - -  Suicidal thoughts 0 - -  PHQ-9 Score 7 - -  Difficult doing work/chores Not difficult at all - -    GAD 7 : Generalized Anxiety Score 03/24/2021  Nervous, Anxious, on Edge 1  Control/stop worrying 1  Worry too much - different things 1  Trouble relaxing 1  Restless 1  Easily annoyed or irritable 0  Afraid - awful might happen 0  Total GAD 7 Score 5  Anxiety Difficulty Not difficult at all      Upstream - 03/24/21 0900       Pregnancy Intention Screening   Does the patient want to become pregnant in the next year? No    Does the patient's partner want to become pregnant in the next year? No    Would  the patient like to discuss contraceptive options today? N/A      Contraception Wrap Up   Current Method Abstinence    End Method Abstinence    Contraception Counseling Provided No                Assessment:    1. Dyspnea on exertion Will refer to cardiologist  Referral Orders  Ambulatory referral to Cardiology    2. Weakness Refer to cardiology  3. Hot flashes Dress in layers, avoid alcohol and spicy food I would not use estrogen with LCIS,may try SSRI, but want to get cardiology consult first   4. Sweats, menopausal   5. 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 Perimenopause     Plan:    Follow up in 4 weeks for pap and ROS

## 2021-04-08 ENCOUNTER — Encounter (HOSPITAL_COMMUNITY): Payer: Self-pay

## 2021-04-22 ENCOUNTER — Other Ambulatory Visit (HOSPITAL_COMMUNITY)
Admission: RE | Admit: 2021-04-22 | Discharge: 2021-04-22 | Disposition: A | Discharge status: Home / Self Care | Payer: Medicaid Other | Source: Ambulatory Visit | Attending: Adult Health | Admitting: Adult Health

## 2021-04-22 ENCOUNTER — Ambulatory Visit (INDEPENDENT_AMBULATORY_CARE_PROVIDER_SITE_OTHER): Payer: Medicaid Other | Admitting: Adult Health

## 2021-04-22 ENCOUNTER — Encounter: Payer: Self-pay | Admitting: Adult Health

## 2021-04-22 ENCOUNTER — Other Ambulatory Visit: Payer: Self-pay

## 2021-04-22 VITALS — BP 169/95 | HR 76 | Ht 63.2 in | Wt 248.0 lb

## 2021-04-22 DIAGNOSIS — R0600 Dyspnea, unspecified: Secondary | ICD-10-CM

## 2021-04-22 DIAGNOSIS — Z1231 Encounter for screening mammogram for malignant neoplasm of breast: Secondary | ICD-10-CM

## 2021-04-22 DIAGNOSIS — R531 Weakness: Secondary | ICD-10-CM

## 2021-04-22 DIAGNOSIS — D0501 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: Secondary | ICD-10-CM | POA: Diagnosis not present

## 2021-04-22 DIAGNOSIS — N951 Menopausal and female climacteric states: Secondary | ICD-10-CM | POA: Diagnosis not present

## 2021-04-22 DIAGNOSIS — Z1211 Encounter for screening for malignant neoplasm of colon: Secondary | ICD-10-CM

## 2021-04-22 DIAGNOSIS — Z01419 Encounter for gynecological examination (general) (routine) without abnormal findings: Secondary | ICD-10-CM

## 2021-04-22 DIAGNOSIS — R232 Flushing: Secondary | ICD-10-CM

## 2021-04-22 DIAGNOSIS — R0609 Other forms of dyspnea: Secondary | ICD-10-CM

## 2021-04-22 LAB — HEMOCCULT GUIAC POC 1CARD (OFFICE): Fecal Occult Blood, POC: NEGATIVE

## 2021-04-22 NOTE — Progress Notes (Signed)
Patient ID: Alicia Coffey, female   DOB: [birth year], 50 y.o.   MRN: [id number] History of Present Illness: Alicia Coffey is a 50 year old black female,single, G0P0, in for a well woman gyn exam and pap. PCP is [name] NP   Current Medications, Allergies, Past Medical History, Past Surgical History, Family History and Social History were reviewed in [employer] record.     Review of Systems: Patient denies any headaches, hearing loss, fatigue, blurred vision, chest pain, abdominal pain, problems with bowel movements, urination, or intercourse.(Never had sex) No joint pain or mood swings.  +irregular periods +hot flashes and sweats  +weak feeling and shortness of breath with exertion, seeing cardiologist 04/25/21 Sharp left breasts at times.  Physical Exam:BP (!) 169/95 (BP Location: Left Arm, Patient Position: Sitting, Cuff Size: Large)   Pulse 76   Ht 5' 3.2" (1.605 m)   Wt 248 lb (112.5 kg)   BMI 43.65 kg/m   General:  Well developed, well nourished, no acute distress Skin:  Warm and dry Neck:  Midline trachea, normal thyroid, good ROM, no lymphadenopathy Lungs; Clear to auscultation bilaterally Breast:  No dominant palpable mass, retraction, or nipple discharge,tender over scar left breast at areola edge Cardiovascular: Regular rate and rhythm Abdomen:  Soft, non tender, no hepatosplenomegaly Pelvic:  External genitalia is normal in appearance, no lesions.  The vagina is normal in appearance.Small Pederson speculum used. Urethra has no lesions or masses. The cervix is nulliparous and pap with HR HPV genotyping performed.   Uterus is felt to be normal size, shape, and contour.  No adnexal masses or tenderness noted.Bladder is non tender, no masses felt. Rectal: Good sphincter tone, no polyps, or hemorrhoids felt.  Hemoccult negative. Extremities/musculoskeletal:  No swelling or varicosities noted, no clubbing or cyanosis Psych:  No mood changes, alert and  cooperative,seems happy AA is 0  Fall risk is low Depression screen [facility] 2/9 04/22/2021 03/24/2021 03/24/2021  Decreased Interest 1 1 0  Down, Depressed, Hopeless 1 1 1   PHQ - 2 Score 2 2 1   Altered sleeping 2 1 -  Tired, decreased energy 2 3 -  Change in appetite 1 - -  Feeling bad or failure about yourself  1 0 -  Trouble concentrating 1 1 -  Moving slowly or fidgety/restless 0 0 -  Suicidal thoughts 0 0 -  PHQ-9 Score 9 7 -  Difficult doing work/chores - Not difficult at all -    GAD 7 : Generalized Anxiety Score 04/22/2021 03/24/2021  Nervous, Anxious, on Edge 1 1  Control/stop worrying 1 1  Worry too much - different things 1 1  Trouble relaxing 2 1  Restless 1 1  Easily annoyed or irritable 2 0  Afraid - awful might happen 1 0  Total GAD 7 Score 9 5  Anxiety Difficulty - Not difficult at all      Upstream - 04/22/21 1019       Pregnancy Intention Screening   Does the patient want to become pregnant in the next year? No    Does the patient's partner want to become pregnant in the next year? No    Would the patient like to discuss contraceptive options today? No      Contraception Wrap Up   Current Method No Method - Other Reason;Abstinence    End Method No Method - Other Reason;Abstinence    Contraception Counseling Provided No            She  gave verbal consent for exam without chaperone.  Impression and Plan: 1. Encounter for gynecological examination with Papanicolaou smear of cervix Pap sent Physical in 1 year Pap in 3 if normal Labs with PCP Had cologuard that was normal this year she says   2. Encounter for screening fecal occult blood testing  3. Perimenopause   4. 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 Sweats, menopausal   6. Hot flashes May try SSRI but after cardiology consult  Follow up in 4 weeks for ROS   7. Dyspnea on exertion Seeing cardiologist 04/25/21  8. Weakness Seeing  cardiologist    9. Screening mammogram for breast cancer She will call for appt

## 2021-04-23 ENCOUNTER — Ambulatory Visit (INDEPENDENT_AMBULATORY_CARE_PROVIDER_SITE_OTHER): Payer: Medicaid Other | Admitting: Podiatry

## 2021-04-23 ENCOUNTER — Encounter: Payer: Self-pay | Admitting: Podiatry

## 2021-04-23 DIAGNOSIS — M79609 Pain in unspecified limb: Secondary | ICD-10-CM

## 2021-04-23 DIAGNOSIS — B351 Tinea unguium: Secondary | ICD-10-CM | POA: Diagnosis not present

## 2021-04-23 DIAGNOSIS — E1142 Type 2 diabetes mellitus with diabetic polyneuropathy: Secondary | ICD-10-CM | POA: Diagnosis not present

## 2021-04-26 NOTE — Progress Notes (Signed)
Subjective: Alicia Coffey is a pleasant 50 y.o. female patient seen today for preventative diabetic foot care. She has painful thick toenails that are difficult to trim. Pain interferes with ambulation. Aggravating factors include wearing enclosed shoe gear. Pain is relieved with periodic professional debridement.  She states part of her right great toenail came off on June 4th. States it spontaneously became loose.   Her blood glucose was 103 mg/dl today.  PCP is Vonna Drafts, FNP.  Allergies  Allergen Reactions   Penicillins Other (See Comments)    PATIENT HAS HAD A PCN REACTION WITH IMMEDIATE RASH, FACIAL/TONGUE/THROAT SWELLING, SOB, OR LIGHTHEADEDNESS WITH HYPOTENSION:  #  #  YES  #  #  Has patient had a PCN reaction causing severe rash involving mucus membranes or skin necrosis: No Has patient had a PCN reaction that required hospitalization No Has patient had a PCN reaction occurring within the last 10 years: No    Other Hives, Itching and Rash    Powder in gloves Arthropod Insect    Objective: Physical Exam  General: Alicia Coffey is a pleasant 50 y.o. African American female, in NAD. AAO x 3.   Vascular:  Capillary refill time to digits immediate b/l. Palpable DP pulse(s) b/l lower extremities Faintly palpable PT pulse(s) b/l lower extremities. Pedal hair absent. Lower extremity skin temperature gradient within normal limits. No edema noted b/l lower extremities.  Dermatological:  Pedal skin with normal turgor, texture and tone b/l lower extremities. No open wounds b/l lower extremities. No interdigital macerations b/l lower extremities. Toenails L hallux, L 3rd toe, L 4th toe, L 5th toe, R hallux, R 3rd toe, R 4th toe, and R 5th toe elongated, discolored, dystrophic, thickened, and crumbly with subungual debris and tenderness to dorsal palpation. Anonychia noted L 2nd toe and R 2nd toe. Nailbed(s) epithelialized with mild hyperkeatosis.   Musculoskeletal:  Normal  muscle strength 5/5 to all lower extremity muscle groups bilaterally. No pain crepitus or joint limitation noted with ROM b/l. Hammertoe(s) noted to the 2-5 bilaterally.  Neurological:  Protective sensation decreased with 10 gram monofilament b/l. Vibratory sensation diminished b/l.  Assessment and Plan:  1. Pain due to onychomycosis of nail   2. Diabetic peripheral neuropathy associated with type 2 diabetes mellitus ([place])     -Examined patient. -Mild hyperkeratoses pared as a courtesy b/l 2nd toe nailbeds. -Patient to continue soft, supportive shoe gear daily. -Toenails L hallux, L 3rd toe, L 4th toe, L 5th toe, R hallux, R 3rd toe, R 4th toe, and R 5th toe debrided in length and girth without iatrogenic bleeding with sterile nail nipper and dremel.  -Patient to report any pedal injuries to medical professional immediately. -Patient/POA to call should there be question/concern in the interim.  Return in about 3 months (around 07/24/2021).  Marzetta Board, DPM

## 2021-04-28 LAB — CYTOLOGY - PAP
Comment: NEGATIVE
Diagnosis: NEGATIVE
High risk HPV: NEGATIVE

## 2021-05-01 ENCOUNTER — Encounter: Payer: Self-pay | Admitting: Cardiology

## 2021-05-01 NOTE — Progress Notes (Signed)
Cardiology Office Note  Date: 05/02/2021   ID: Alicia Coffey, Alicia Coffey 03/10/1971, MRN [id number]  PCP:  Vonna Drafts, FNP  Cardiologist:  Rozann Lesches, MD Electrophysiologist:  None   Chief Complaint  Patient presents with   Shortness of Breath     History of Present Illness: Alicia Coffey is a 50 y.o. female referred for cardiology consultation by Ms. Griffin NP for evaluation of shortness of breath.  She tells me that over the last few years she has noticed shortness of breath, describes NYHA class II-III symptoms with activity such as walking from the parking lot into a store, or going up steps.  She also reports what sounds like hot flashes and diaphoresis at times, not always associated with activity.  She does not describe chest discomfort, no definite sense of palpitations.  She does have history of breast cancer as noted below.  Did not undergo radiation treatments.  She has been in remission.  She also has a history of type 2 diabetes mellitus, hypertension, hyperlipidemia, and CKD stage II.  I reviewed her present medications.  I personally reviewed her ECG today which shows sinus rhythm with poor R wave progression.  We did have her ambulate in the hall with oxygen saturation not dropping below 95%.  Past Medical History:  Diagnosis Date   Anemia    Anxiety    Arthritis    Right knee   CKD (chronic kidney disease) stage 2, GFR 60-89 ml/min    Dr. 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   Obstructive sleep apnea of adult 2017   Proteinuria    Type 2 diabetes mellitus ([place])    Vertigo 2015   Wears dentures     Past Surgical History:  Procedure Laterality Date   ABLATION     uterine    BREAST BIOPSY Left 2014   benign   BREAST BIOPSY Right 2013   high risk    BREAST BIOPSY Left 2021   benign   BREAST BIOPSY Left 2021   benign   BREAST EXCISIONAL BIOPSY Left    BREAST LUMPECTOMY Left 2021   benign    BREAST LUMPECTOMY WITH NEEDLE LOCALIZATION  08/15/2012   Procedure: BREAST LUMPECTOMY WITH NEEDLE LOCALIZATION;  Surgeon: Haywood Lasso, MD;  Location: [facility];  Service: General;  Laterality: Right;  Wire localizations Right breast calcifications   BREAST LUMPECTOMY WITH RADIOACTIVE SEED LOCALIZATION Left 01/30/2020   Procedure: LEFT BREAST LUMPECTOMY X 2 WITH RADIOACTIVE SEED LOCALIZATION;  Surgeon: Coralie Keens, MD;  Location: [facility];  Service: General;  Laterality: Left;   BREAST SURGERY     CARPAL TUNNEL RELEASE Right 11/24/2017   Procedure: RIGHT CARPAL TUNNEL RELEASE;  Surgeon: Ashok Pall, MD;  Location: [facility];  Service: Neurosurgery;  Laterality: Right;  Right CARPAL TUNNEL RELEASE   CARPAL TUNNEL RELEASE Left 08/31/2018   Procedure: LEFT CARPAL TUNNEL RELEASE;  Surgeon: Ashok Pall, MD;  Location: [facility];  Service: Neurosurgery;  Laterality: Left;   DILITATION & CURRETTAGE/HYSTROSCOPY WITH THERMACHOICE ABLATION N/A 08/21/2014   Procedure: DILATATION & CURETTAGE/HYSTEROSCOPY WITH THERMACHOICE ABLATION;  Surgeon: Jonnie Kind, MD;  Location: [facility];  Service: Gynecology;  Laterality: N/A;   KNEE ARTHROSCOPY Left  10/16/2015   Procedure: ARTHROSCOPY KNEE with debridment;  Surgeon: Frederik Pear, MD;  Location: [place] [facility];  Service: Orthopedics;  Laterality: Left;   KNEE ARTHROSCOPY WITH MEDIAL MENISECTOMY Right 07/17/2015   Procedure: KNEE ARTHROSCOPY WITH MEDIAL MENISECTOMY;  Surgeon: Frederik Pear, MD;  Location: [facility];  Service: Orthopedics;  Laterality: Right;   POLYPECTOMY N/A 08/21/2014   Procedure: ENDOMETRIAL POLYPECTOMY;  Surgeon: Jonnie Kind, MD;  Location: [facility];  Service: Gynecology;  Laterality: N/A;   REFRACTIVE SURGERY Right    micro aneuysms    ULNAR NERVE TRANSPOSITION Right 11/24/2017   Procedure: RIGHT ULNAR NERVE DECOMPRESSION/TRANSPOSITION;  Surgeon: Ashok Pall, MD;  Location: [facility];  Service: Neurosurgery;  Laterality: Right;  Right ULNAR NERVE DECOMPRESSION/TRANSPOSITION    Current Outpatient Medications  Medication Sig Dispense Refill   ACCU-CHEK AVIVA PLUS test strip use 1 test strips 2 times daily  5   albuterol (PROVENTIL HFA;VENTOLIN HFA) 108 (90 Base) MCG/ACT inhaler Inhale 2 puffs into the lungs every 6 (six) hours as needed for wheezing or shortness of breath.     amitriptyline (ELAVIL) 50 MG tablet Take 50 mg by mouth at bedtime.     aspirin 81 MG chewable tablet Chew by mouth daily.     benazepril (LOTENSIN) 10 MG tablet Take 10 mg by mouth daily.     bisacodyl (DULCOLAX) 5 MG EC tablet Take 5 mg by mouth 2 (two) times daily.      Blood Glucose Monitoring Suppl (ACCU-CHEK AVIVA PLUS) w/Device KIT USE TO CHECK BLOOD SUGAR  0   bumetanide (BUMEX) 0.5 MG tablet Take 1 mg by mouth 2 (two) times daily.      Cholecalciferol 25 MCG (1000 UT) capsule Take 1,000 Units by mouth 2 (two) times daily.     CLOBETASOL PROPIONATE E 0.05 % emollient cream Apply 1 application topically 2 (two) times daily as needed (itching).  0   Dulaglutide (TRULICITY) 1.5 RK/2.7CW SOPN Inject into the skin.     DULoxetine (CYMBALTA) 60 MG capsule Take 1 capsule (60 mg total) by mouth daily. (Patient taking differently: Take 60 mg by mouth at bedtime. Patient is taking 1 capsule 2 x daily) 30 capsule 12   EASY COMFORT PEN NEEDLES 31G X 8 MM MISC      hydrALAZINE (APRESOLINE) 100 MG tablet Take 100 mg by mouth 3 (three) times daily.     hydrocortisone cream 1 % Apply to affected area 2 times daily 15 g 0   hydrOXYzine (ATARAX/VISTARIL) 25 MG tablet Take 25 mg by mouth at bedtime.     insulin glargine (LANTUS) 100 UNIT/ML injection Inject into the skin.     meloxicam (MOBIC) 15 MG tablet Take 15 mg by mouth daily as needed for pain.      methocarbamol (ROBAXIN) 500 MG tablet Take 500 mg by mouth 2 (two) times daily.     metoprolol tartrate (LOPRESSOR) 25 MG tablet Take 25 mg by mouth 2 (two) times daily.     NOVOLOG FLEXPEN 100 UNIT/ML FlexPen Inject into the skin.     Ondansetron HCl (ZOFRAN PO) Take by mouth.     POLY-IRON 150 150 MG capsule Take 150 mg by mouth 2 (two) times daily.     rosuvastatin (CRESTOR) 40 MG tablet Take 40 mg by mouth at bedtime.      sitaGLIPtin-metformin (JANUMET) 50-1000 MG tablet Take 1 tablet by mouth 2 (two) times daily with a meal.     traMADol (ULTRAM) 50  MG tablet Take 50 mg by mouth daily.     acetaminophen (TYLENOL) 500 MG tablet Take 1,000 mg by mouth every 6 (six) hours as needed for mild pain or headache. (Patient not taking: No sig reported)     acetaminophen-codeine (TYLENOL #3) 300-30 MG tablet Take 1 tablet by mouth every 6 (six) hours as needed for moderate pain. (Patient not taking: No sig reported) 30 tablet 0   azithromycin (ZITHROMAX) 250 MG tablet azithromycin 250 mg tablet  TAKE 2 TABLETS BY MOUTH ON DAY 1, THEN 1 TABLET ONCE DAILY FOR 4 DAYS. (Patient not taking: No sig reported)     benazepril (LOTENSIN) 40 MG tablet Take 40 mg by mouth daily. (Patient not taking: Reported on 05/02/2021)     ciclopirox (LOPROX) 0.77 % cream Apply 1 application topically 2 (two) times daily as needed (itching).  (Patient not taking: No sig reported)     clobetasol ointment (TEMOVATE) 0.62 % Apply 1 application topically 2 (two) times daily. (Patient not taking: Reported on 05/02/2021)     cyclobenzaprine (FLEXERIL) 10 MG tablet  (Patient not taking: Reported on 05/02/2021)     doxycycline (VIBRAMYCIN) 100 MG capsule doxycycline hyclate 100 mg capsule  TAKE 1 CAPSULE BY MOUTH 2 TIMES DAILY FOR 10 DAYS (Patient not taking: Reported on 04/22/2021)     Exenatide ER (BYDUREON) 2 MG PEN Inject 1 Dose into the skin once a week. (Patient not taking: Reported on 05/02/2021)     EZFE 200 434.8 (200 Fe) MG CAPS  Take 1 capsule by mouth 2 (two) times daily.     hydrALAZINE (APRESOLINE) 50 MG tablet Take 50 mg by mouth 3 (three) times daily. (Patient not taking: Reported on 05/02/2021)     lidocaine (LIDODERM) 5 % Place 1 patch onto the skin daily as needed (pain). Remove & Discard patch within 12 hours or as directed by MD (Patient not taking: No sig reported)     Omega-3 Fatty Acids (FISH OIL) 1000 MG CAPS Take by mouth. (Patient not taking: Reported on 05/02/2021)     OVER THE COUNTER MEDICATION Take 1 capsule by mouth daily. Active Lean Keto Dietary Supplement (Patient not taking: No sig reported)     oxyCODONE (OXY IR/ROXICODONE) 5 MG immediate release tablet Take 1 tablet (5 mg total) by mouth every 6 (six) hours as needed for moderate pain, severe pain or breakthrough pain. (Patient not taking: Reported on 05/02/2021) 25 tablet 0   PREVIDENT 5000 SENSITIVE 1.1-5 % PSTE Take 1 application by mouth See admin instructions. After regular brushing, flossing and rinsing, brush a pea size of this paste for 2 minutes then spit it out thoroughly (Patient not taking: No sig reported)  4   propranolol (INDERAL) 20 MG tablet Take by mouth. (Patient not taking: No sig reported)     SF 5000 PLUS 1.1 % CREA dental cream AFTER REGULAR BRUSHING FLOSSING AND RINSING BRUSH PEA SIZE OF PASTE FOR 2 MINUES SPIT OUT THOROUGHLY. DO NOT RINSE AND NOTHING BY MOUTH FOR (Patient not taking: No sig reported)     No current facility-administered medications for this visit.   Allergies:  Penicillins and Other   Social History: The patient  reports that she has never smoked. She has never used smokeless tobacco. She reports that she does not drink alcohol and does not use drugs.   Family History: The patient's family history includes Breast cancer (age of onset: 62) in her paternal grandmother; Diabetes in her brother, father, mother, sister,  and sister; Fibromyalgia in her sister; Heart attack in her father, maternal grandfather,  maternal grandmother, and paternal grandfather; Heart disease in her father and mother; Hypertension in her father and mother.   ROS: No palpitations or syncope.  Physical Exam: VS:  BP 122/78   Pulse 78   Ht 5' 3.5" (1.613 m)   Wt 247 lb (112 kg)   SpO2 98%   BMI 43.07 kg/m , BMI Body mass index is 43.07 kg/m.  Wt Readings from Last 3 Encounters:  05/02/21 247 lb (112 kg)  04/22/21 248 lb (112.5 kg)  03/24/21 248 lb 8 oz (112.7 kg)    General: Patient appears comfortable at rest.  Using a cane. HEENT: Conjunctiva and lids normal, wearing a mask. Neck: Supple, no elevated JVP or carotid bruits, no thyromegaly. Lungs: Clear to auscultation, nonlabored breathing at rest. Cardiac: Regular rate and rhythm, no S3 or significant systolic murmur, no pericardial rub. Abdomen: Soft, nontender, bowel sounds present. Extremities: Trace ankle edema, distal pulses 2+. Skin: Warm and dry. Musculoskeletal: No kyphosis. Neuropsychiatric: Alert and oriented x3, affect grossly appropriate.  ECG:  An ECG dated 01/26/2020 was personally reviewed today and demonstrated:  Sinus rhythm with leftward axis.  Recent Labwork:  April 2021: Potassium 4.4, BUN 13, creatinine 1.02  Other Studies Reviewed Today:  Chest x-ray 09/11/2019: FINDINGS: Heart and mediastinal contours are within normal limits. No focal opacities or effusions. No acute bony abnormality.   IMPRESSION: No active disease.  Assessment and Plan:  1.  Fairly longstanding dyspnea on exertion in a 50 year old woman with history of type 2 diabetes mellitus, hypertension, hyperlipidemia, and CKD stage II.  She has prior history of right-sided breast cancer although did not require radiation treatments and has done well over time.  ECG reviewed and nonspecific with poor R wave progression.  She has not undergone any recent cardiac structural or ischemic testing.  Plan to proceed with an echocardiogram and a Lexiscan Myoview.  2.   Essential hypertension, blood pressure controlled today, although recently underwent medication adjustments.  She is currently on Lopressor, hydralazine, and Lotensin.  Medication Adjustments/Labs and Tests Ordered: Current medicines are reviewed at length with the patient today.  Concerns regarding medicines are outlined above.   Tests Ordered: Orders Placed This Encounter  Procedures   NM Myocar Multi W/Spect W/Wall Motion / EF   EKG 12-Lead   ECHOCARDIOGRAM COMPLETE     Medication Changes: No orders of the defined types were placed in this encounter.   Disposition:  Follow up  test results.  Signed, Satira Sark, MD, [facility] 05/02/2021 1:55 PM    [address] [facility] [address], [place], [place] [address] Phone: [phone]; Fax: [phone]

## 2021-05-02 ENCOUNTER — Other Ambulatory Visit: Payer: Self-pay

## 2021-05-02 ENCOUNTER — Ambulatory Visit (INDEPENDENT_AMBULATORY_CARE_PROVIDER_SITE_OTHER): Payer: Medicaid Other | Admitting: Cardiology

## 2021-05-02 ENCOUNTER — Encounter: Payer: Self-pay | Admitting: Cardiology

## 2021-05-02 VITALS — BP 122/78 | HR 78 | Ht 63.5 in | Wt 247.0 lb

## 2021-05-02 DIAGNOSIS — R0602 Shortness of breath: Secondary | ICD-10-CM

## 2021-05-02 DIAGNOSIS — R0600 Dyspnea, unspecified: Secondary | ICD-10-CM | POA: Diagnosis not present

## 2021-05-02 DIAGNOSIS — R9431 Abnormal electrocardiogram [ECG] [EKG]: Secondary | ICD-10-CM | POA: Diagnosis not present

## 2021-05-02 DIAGNOSIS — E119 Type 2 diabetes mellitus without complications: Secondary | ICD-10-CM

## 2021-05-02 DIAGNOSIS — I10 Essential (primary) hypertension: Secondary | ICD-10-CM

## 2021-05-02 DIAGNOSIS — R0609 Other forms of dyspnea: Secondary | ICD-10-CM

## 2021-05-02 NOTE — Patient Instructions (Signed)
Medication Instructions:  Your physician recommends that you continue on your current medications as directed. Please refer to the Current Medication list given to you today.  *If you need a refill on your cardiac medications before your next appointment, please call your pharmacy*   Lab Work: None today  If you have labs (blood work) drawn today and your tests are completely normal, you will receive your results only by: [facility] (if you have MyChart) OR A paper copy in the mail If you have any lab test that is abnormal or we need to change your treatment, we will call you to review the results.   Testing/Procedures: Your physician has requested that you have a lexiscan myoview. For further information please visit [email or web address]. Please follow instruction sheet, as given.   Your physician has requested that you have an echocardiogram. Echocardiography is a painless test that uses sound waves to create images of your heart. It provides your doctor with information about the size and shape of your heart and how well your heart's chambers and valves are working. This procedure takes approximately one hour. There are no restrictions for this procedure.    Follow-Up: At [facility], you and your health needs are our priority.  As part of our continuing mission to provide you with exceptional heart care, we have created designated Provider Care Teams.  These Care Teams include your primary Cardiologist (physician) and Advanced Practice Providers (APPs -  Physician Assistants and Nurse Practitioners) who all work together to provide you with the care you need, when you need it.  We recommend signing up for the patient portal called "MyChart".  Sign up information is provided on this After Visit Summary.  MyChart is used to connect with patients for Virtual Visits (Telemedicine).  Patients are able to view lab/test results, encounter notes, upcoming appointments, etc.  Non-urgent  messages can be sent to your provider as well.   To learn more about what you can do with MyChart, go to [email or web address].    Your next appointment:  To e determined after testing

## 2021-05-19 ENCOUNTER — Telehealth: Payer: Self-pay | Admitting: Podiatry

## 2021-05-19 NOTE — Telephone Encounter (Signed)
Alicia Coffey  called and said that she left some paperwork for you to complete. I was just checking on the paperwork. I told her I never received anything and she said the front desk was suppose to give to you.

## 2021-05-20 ENCOUNTER — Ambulatory Visit: Payer: Medicaid Other | Admitting: Adult Health

## 2021-05-26 ENCOUNTER — Encounter (HOSPITAL_BASED_OUTPATIENT_CLINIC_OR_DEPARTMENT_OTHER)
Admission: RE | Admit: 2021-05-26 | Discharge: 2021-05-26 | Disposition: A | Discharge status: Home / Self Care | Payer: Medicaid Other | Source: Ambulatory Visit | Attending: Cardiology | Admitting: Cardiology

## 2021-05-26 ENCOUNTER — Other Ambulatory Visit: Payer: Self-pay

## 2021-05-26 ENCOUNTER — Ambulatory Visit (HOSPITAL_COMMUNITY)
Admission: RE | Admit: 2021-05-26 | Discharge: 2021-05-26 | Disposition: A | Discharge status: Home / Self Care | Payer: Medicaid Other | Source: Ambulatory Visit | Attending: Cardiology | Admitting: Cardiology

## 2021-05-26 DIAGNOSIS — R0609 Other forms of dyspnea: Secondary | ICD-10-CM

## 2021-05-26 DIAGNOSIS — R9431 Abnormal electrocardiogram [ECG] [EKG]: Secondary | ICD-10-CM | POA: Insufficient documentation

## 2021-05-26 DIAGNOSIS — R0600 Dyspnea, unspecified: Secondary | ICD-10-CM | POA: Insufficient documentation

## 2021-05-26 LAB — NM MYOCAR MULTI W/SPECT W/WALL MOTION / EF
LV dias vol: 80 mL (ref 46–106)
LV sys vol: 32 mL
Peak HR: 93 {beats}/min
RATE: 0.75
Rest HR: 71 {beats}/min
SDS: 1
SRS: 0
SSS: 1
TID: 1.19

## 2021-05-26 LAB — ECHOCARDIOGRAM COMPLETE
Area-P 1/2: 2.92 cm2
S' Lateral: 2.6 cm

## 2021-05-26 MED ORDER — TECHNETIUM TC 99M TETROFOSMIN IV KIT
10.0000 | PACK | Freq: Once | INTRAVENOUS | Status: AC | PRN
  Administered 2021-05-26: 11 via INTRAVENOUS

## 2021-05-26 MED ORDER — SODIUM CHLORIDE FLUSH 0.9 % IV SOLN
INTRAVENOUS | Status: AC
  Administered 2021-05-26: 10 mL via INTRAVENOUS
  Filled 2021-05-26: qty 10

## 2021-05-26 MED ORDER — TECHNETIUM TC 99M TETROFOSMIN IV KIT
30.0000 | PACK | Freq: Once | INTRAVENOUS | Status: AC | PRN
  Administered 2021-05-26: 30 via INTRAVENOUS

## 2021-05-26 MED ORDER — REGADENOSON 0.4 MG/5ML IV SOLN
INTRAVENOUS | Status: AC
  Administered 2021-05-26: 0.4 mg via INTRAVENOUS
  Filled 2021-05-26: qty 5

## 2021-05-26 NOTE — Progress Notes (Signed)
*  PRELIMINARY RESULTS* Echocardiogram 2D Echocardiogram has been performed.  Alicia Coffey 05/26/2021, 11:16 AM

## 2021-06-04 ENCOUNTER — Encounter: Payer: Self-pay | Admitting: Adult Health

## 2021-06-04 ENCOUNTER — Other Ambulatory Visit: Payer: Self-pay

## 2021-06-04 ENCOUNTER — Ambulatory Visit (INDEPENDENT_AMBULATORY_CARE_PROVIDER_SITE_OTHER): Payer: Medicaid Other | Admitting: Adult Health

## 2021-06-04 VITALS — BP 111/71 | HR 73 | Ht 63.5 in | Wt 244.0 lb

## 2021-06-04 DIAGNOSIS — N951 Menopausal and female climacteric states: Secondary | ICD-10-CM | POA: Diagnosis not present

## 2021-06-04 DIAGNOSIS — R232 Flushing: Secondary | ICD-10-CM

## 2021-06-04 NOTE — Progress Notes (Signed)
  Subjective:     Patient ID: Alicia Coffey, female   DOB: Jan 02, 1971, 50 y.o.   MRN: [id number]  HPI [name] is a 50 year old black female, single, G0P0, back in follow up after seeing cardiologist and talk about hot flashes. She had good report she said. Lab Results  Component Value Date   DIAGPAP  04/22/2021    - Negative for intraepithelial lesion or malignancy (NILM)   [place] Negative 04/22/2021    PCP is [name] NP.  Review of Systems +hot flashes Has body aches, knees and back, using cane Reviewed past medical,surgical, social and family history. Reviewed medications and allergies.     Objective:   Physical Exam BP 111/71 (BP Location: Left Arm, Patient Position: Sitting, Cuff Size: Large)   Pulse 73   Ht 5' 3.5" (1.613 m)   Wt 244 lb (110.7 kg)   LMP 06/02/2021   BMI 42.54 kg/m     Skin warm and dry. Lungs: clear to ausculation bilaterally. Cardiovascular: regular rate and rhythm.    Assessment:     1. Hot flashes Discussed would not use estrogen due to history of  breast cancer, and she is on Cymbalta, that may help some, dress in layers, have fan, avoid spicy foods or alcohol, and she agrees with this  2. Perimenopause     Plan:     Follow up prn Pap in 3 years

## 2021-06-24 ENCOUNTER — Ambulatory Visit
Admission: RE | Admit: 2021-06-24 | Discharge: 2021-06-24 | Disposition: A | Discharge status: Home / Self Care | Payer: Medicaid Other | Source: Ambulatory Visit | Attending: Adult Health | Admitting: Adult Health

## 2021-06-24 ENCOUNTER — Other Ambulatory Visit: Payer: Self-pay

## 2021-06-24 DIAGNOSIS — Z1231 Encounter for screening mammogram for malignant neoplasm of breast: Secondary | ICD-10-CM

## 2021-06-30 ENCOUNTER — Other Ambulatory Visit: Payer: Self-pay | Admitting: Adult Health

## 2021-06-30 DIAGNOSIS — R928 Other abnormal and inconclusive findings on diagnostic imaging of breast: Secondary | ICD-10-CM

## 2021-07-15 ENCOUNTER — Other Ambulatory Visit: Payer: Self-pay | Admitting: Adult Health

## 2021-07-15 ENCOUNTER — Other Ambulatory Visit: Payer: Self-pay

## 2021-07-15 ENCOUNTER — Ambulatory Visit
Admission: RE | Admit: 2021-07-15 | Discharge: 2021-07-15 | Disposition: A | Discharge status: Home / Self Care | Payer: Medicaid Other | Source: Ambulatory Visit | Attending: Adult Health | Admitting: Adult Health

## 2021-07-15 DIAGNOSIS — R928 Other abnormal and inconclusive findings on diagnostic imaging of breast: Secondary | ICD-10-CM

## 2021-07-30 ENCOUNTER — Other Ambulatory Visit: Payer: Self-pay

## 2021-07-30 ENCOUNTER — Ambulatory Visit (INDEPENDENT_AMBULATORY_CARE_PROVIDER_SITE_OTHER): Payer: Medicaid Other | Admitting: Podiatry

## 2021-07-30 ENCOUNTER — Encounter: Payer: Self-pay | Admitting: Podiatry

## 2021-07-30 DIAGNOSIS — B351 Tinea unguium: Secondary | ICD-10-CM | POA: Diagnosis not present

## 2021-07-30 DIAGNOSIS — M79609 Pain in unspecified limb: Secondary | ICD-10-CM

## 2021-07-30 DIAGNOSIS — E1142 Type 2 diabetes mellitus with diabetic polyneuropathy: Secondary | ICD-10-CM | POA: Diagnosis not present

## 2021-08-05 NOTE — Progress Notes (Signed)
  Subjective:  Patient ID: Alicia Coffey, female    DOB: 01-Jan-1971,  MRN: [id number]  Alicia Coffey presents to clinic today for thick, elongated toenails b/l feet which are tender when wearing enclosed shoe gear.  Patient states blood glucose was between 145-149 mg/dl on yesterday.  Patient did not check blood glucose today.  She notes no new pedal problems on today's visit.  PCP is Vonna Drafts, FNP , and last visit was three months ago.  Allergies  Allergen Reactions   Penicillins Other (See Comments)    PATIENT HAS HAD A PCN REACTION WITH IMMEDIATE RASH, FACIAL/TONGUE/THROAT SWELLING, SOB, OR LIGHTHEADEDNESS WITH HYPOTENSION:  #  #  YES  #  #  Has patient had a PCN reaction causing severe rash involving mucus membranes or skin necrosis: No Has patient had a PCN reaction that required hospitalization No Has patient had a PCN reaction occurring within the last 10 years: No    Other Hives, Itching and Rash    Powder in gloves Arthropod Insect    Review of Systems: Negative except as noted in the HPI. Objective:   Constitutional Danika R Mervin is a pleasant 50 y.o. African American female, in NAD. AAO x 3.   Vascular Capillary refill time to digits immediate b/l. Palpable pedal pulses b/l LE. Pedal hair absent. Lower extremity skin temperature gradient within normal limits. No pain with calf compression b/l. No edema noted b/l lower extremities. No cyanosis or clubbing noted.  Neurologic Normal speech. Oriented to person, place, and time. Protective sensation decreased with 10 gram monofilament b/l. Vibratory sensation diminished b/l.  Dermatologic Pedal skin is warm and supple b/l LE. No open wounds b/l lower extremities. No interdigital macerations b/l lower extremities. Toenails 1-5 b/l elongated, discolored, dystrophic, thickened, crumbly with subungual debris and tenderness to dorsal palpation.  Orthopedic: Normal muscle strength 5/5 to all lower extremity muscle groups  bilaterally. Hammertoe(s) noted to the 2-5 bilaterally.   Radiographs: None Assessment:   1. Pain due to onychomycosis of nail   2. Diabetic peripheral neuropathy associated with type 2 diabetes mellitus ([place])    Plan:  Patient was evaluated and treated and all questions answered. Consent given for treatment as described below: -Continue diabetic foot care principles: inspect feet daily, monitor glucose as recommended by PCP and/or Endocrinologist, and follow prescribed diet per PCP, Endocrinologist and/or dietician. -Patient to continue soft, supportive shoe gear daily. -Mycotic toenails were debrided in length and girth with sterile nail nippers and dremel without iatrogenic bleeding. -Patient to report any pedal injuries to medical professional immediately. -Patient/POA to call should there be question/concern in the interim.  Return in about 3 months (around 10/30/2021).  Marzetta Board, DPM

## 2021-08-10 DIAGNOSIS — G5710 Meralgia paresthetica, unspecified lower limb: Secondary | ICD-10-CM | POA: Insufficient documentation

## 2021-11-10 ENCOUNTER — Ambulatory Visit: Payer: Medicaid Other | Admitting: Podiatry

## 2021-11-10 ENCOUNTER — Other Ambulatory Visit: Payer: Self-pay

## 2021-11-10 DIAGNOSIS — M21531 Acquired clawfoot, right foot: Secondary | ICD-10-CM

## 2021-11-10 DIAGNOSIS — M2141 Flat foot [pes planus] (acquired), right foot: Secondary | ICD-10-CM | POA: Diagnosis not present

## 2021-11-10 DIAGNOSIS — M79609 Pain in unspecified limb: Secondary | ICD-10-CM

## 2021-11-10 DIAGNOSIS — M205X2 Other deformities of toe(s) (acquired), left foot: Secondary | ICD-10-CM

## 2021-11-10 DIAGNOSIS — M2142 Flat foot [pes planus] (acquired), left foot: Secondary | ICD-10-CM

## 2021-11-10 DIAGNOSIS — E1142 Type 2 diabetes mellitus with diabetic polyneuropathy: Secondary | ICD-10-CM

## 2021-11-10 DIAGNOSIS — E119 Type 2 diabetes mellitus without complications: Secondary | ICD-10-CM

## 2021-11-10 DIAGNOSIS — B351 Tinea unguium: Secondary | ICD-10-CM

## 2021-11-10 NOTE — Progress Notes (Signed)
ANNUAL DIABETIC FOOT EXAM  Subjective: Alicia Coffey presents today for for annual diabetic foot examination.  Patient relates medical h/o diabetes.  Patient has h/o foot ulcer of L 2nd toe, which healed via help of local wound care.  Patient has been diagnosed with neuropathy and it is managed with Cymbalta.  Patient did not check blood glucose this morning, stating she needs a new glucometer.  Risk factors: diabetes, diabetic neuropathy, foot deformity, CKD, hyperlipidemia, HTN.  Alicia Drafts, FNP is patient's PCP. She has appointment on tomorrow.  Past Medical History:  Diagnosis Date   Anemia    Anxiety    Arthritis    Right knee   CKD (chronic kidney disease) stage 2, GFR 60-89 ml/min    Dr. 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   Obstructive sleep apnea of adult 2017   Proteinuria    Type 2 diabetes mellitus ([place])    Vertigo 2015   Wears dentures    Patient Active Problem List   Diagnosis Date Noted   Encounter for gynecological examination with Papanicolaou smear of cervix 04/22/2021   Encounter for screening fecal occult blood testing 04/22/2021   Sweats, menopausal 03/24/2021   Hot flashes 03/24/2021   Weakness 03/24/2021   Dyspnea on exertion 03/24/2021   Perimenopause 03/24/2021   Degeneration of lumbosacral intervertebral disc 07/31/2020   Congenital pes planus 07/31/2020   Encounter for orthopedic follow-up care 12/14/2019   Acquired trigger finger of left ring finger 11/15/2019   Other obesity due to excess calories 07/19/2019   Chronic kidney disease due to type 2 diabetes mellitus ([place]) 07/19/2019   Secondary hyperparathyroidism ([place]) 07/19/2019   Vitamin D deficiency 07/19/2019   Pain in finger of right hand 06/15/2017   Sprain of interphalangeal joint of  right ring finger 06/15/2017   Diabetes 1.5, 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   Idiopathic peripheral neuropathy 09/21/2016   Obesity (BMI 35.0-39.9 without comorbidity) 09/21/2016   Obstructive sleep apnea of adult 09/21/2016   Oropharyngeal dysphagia 09/21/2016   Vertigo 09/21/2016   Menorrhagia with irregular cycle 07/25/2014   Iron deficiency anemia due to chronic blood loss 07/25/2014   Follow up 05/23/2014   Fibroid, uterine 04/04/2014   Abnormal uterine bleeding (AUB) 03/26/2014   Diabetic peripheral neuropathy ([place]) 12/21/2013   Low back pain 12/11/2013   Pain in joint, shoulder region 12/11/2013   Pain in limb 12/11/2013   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   Diabetes ([place]) 07/28/2012   Hypertension 07/28/2012   Hypercholesteremia 07/28/2012   Atypical lobular hyperplasia of breast 07/13/2012   Past Surgical History:  Procedure Laterality Date   ABLATION     uterine    BREAST BIOPSY Left 2014   benign   BREAST BIOPSY Right 2013   high risk   BREAST BIOPSY Left 2021   benign   BREAST BIOPSY Left 2021   benign   BREAST EXCISIONAL BIOPSY Left    BREAST LUMPECTOMY Left 2021   LCIS   BREAST LUMPECTOMY WITH NEEDLE LOCALIZATION  08/15/2012   Procedure: BREAST LUMPECTOMY WITH NEEDLE LOCALIZATION;  Surgeon: Haywood Lasso, MD;  Location: [place];  Service: General;  Laterality: Right;  Wire localizations Right breast calcifications  BREAST LUMPECTOMY WITH RADIOACTIVE SEED LOCALIZATION Left 01/30/2020   Procedure: LEFT BREAST LUMPECTOMY X 2 WITH RADIOACTIVE SEED LOCALIZATION;  Surgeon: Coralie Keens, MD;  Location: [place];  Service: General;  Laterality: Left;   BREAST SURGERY     CARPAL TUNNEL RELEASE Right 11/24/2017   Procedure: RIGHT CARPAL TUNNEL RELEASE;  Surgeon: Ashok Pall, MD;  Location: [place];  Service: Neurosurgery;  Laterality: Right;  Right CARPAL TUNNEL RELEASE   CARPAL TUNNEL RELEASE Left  08/31/2018   Procedure: LEFT CARPAL TUNNEL RELEASE;  Surgeon: Ashok Pall, MD;  Location: [place];  Service: Neurosurgery;  Laterality: Left;   DILITATION & CURRETTAGE/HYSTROSCOPY WITH THERMACHOICE ABLATION N/A 08/21/2014   Procedure: DILATATION & CURETTAGE/HYSTEROSCOPY WITH THERMACHOICE ABLATION;  Surgeon: Jonnie Kind, MD;  Location: [facility];  Service: Gynecology;  Laterality: N/A;   KNEE ARTHROSCOPY Left 10/16/2015   Procedure: ARTHROSCOPY KNEE with debridment;  Surgeon: Frederik Pear, MD;  Location: [place];  Service: Orthopedics;  Laterality: Left;   KNEE ARTHROSCOPY WITH MEDIAL MENISECTOMY Right 07/17/2015   Procedure: KNEE ARTHROSCOPY WITH MEDIAL MENISECTOMY;  Surgeon: Frederik Pear, MD;  Location: [place];  Service: Orthopedics;  Laterality: Right;   POLYPECTOMY N/A 08/21/2014   Procedure: ENDOMETRIAL POLYPECTOMY;  Surgeon: Jonnie Kind, MD;  Location: [facility];  Service: Gynecology;  Laterality: N/A;   REFRACTIVE SURGERY Right    micro aneuysms   ULNAR NERVE TRANSPOSITION Right 11/24/2017   Procedure: RIGHT ULNAR NERVE DECOMPRESSION/TRANSPOSITION;  Surgeon: Ashok Pall, MD;  Location: [place];  Service: Neurosurgery;  Laterality: Right;  Right ULNAR NERVE DECOMPRESSION/TRANSPOSITION   Current Outpatient Medications on File Prior to Visit  Medication Sig Dispense Refill   Empagliflozin-metFORMIN HCl (SYNJARDY) 12.02-999 MG TABS      ACCU-CHEK AVIVA PLUS test strip use 1 test strips 2 times daily  5   acetaminophen (TYLENOL) 500 MG tablet Take 1,000 mg by mouth every 6 (six) hours as needed for mild pain or headache.     acetaminophen-codeine (TYLENOL #3) 300-30 MG tablet Take 1 tablet by mouth every 6 (six) hours as needed for moderate pain. 30 tablet 0   albuterol (PROVENTIL HFA;VENTOLIN HFA) 108 (90 Base) MCG/ACT inhaler Inhale 2 puffs into the lungs every 6 (six) hours as needed for wheezing or shortness of breath.     amitriptyline (ELAVIL) 50 MG  tablet Take 50 mg by mouth at bedtime.     aspirin 81 MG chewable tablet Chew by mouth daily.     benazepril (LOTENSIN) 40 MG tablet Take 40 mg by mouth daily.     bisacodyl (DULCOLAX) 5 MG EC tablet Take 5 mg by mouth 2 (two) times daily.      Blood Glucose Monitoring Suppl (ACCU-CHEK AVIVA PLUS) w/Device KIT USE TO CHECK BLOOD SUGAR  0   bumetanide (BUMEX) 0.5 MG tablet Take 1 mg by mouth 2 (two) times daily.      Cholecalciferol 25 MCG (1000 UT) capsule Take 1,000 Units by mouth 2 (two) times daily.     ciclopirox (LOPROX) 0.77 % cream Apply 1 application topically 2 (two) times daily as needed (itching).     clobetasol ointment (TEMOVATE) 2.50 % Apply 1 application topically 2 (two) times daily.     cyclobenzaprine (FLEXERIL) 10 MG tablet  (Patient not taking: No sig reported)     Dulaglutide (TRULICITY) 1.5 IB/7.0WU SOPN Inject into the skin.     DULoxetine (CYMBALTA) 60 MG capsule Take 1 capsule (60 mg total) by  mouth daily. (Patient taking differently: Take 60 mg by mouth at bedtime. Patient is taking 1 capsule 2 x daily) 30 capsule 12   EASY COMFORT PEN NEEDLES 31G X 8 MM MISC  (Patient not taking: Reported on 06/04/2021)     Empagliflozin-metFORMIN HCl ER (SYNJARDY XR) 25-1000 MG TB24 Take by mouth.     Exenatide ER (BYDUREON) 2 MG PEN Inject 1 Dose into the skin once a week. (Patient not taking: No sig reported)     FLOWFLEX COVID-19 AG HOME TEST KIT USE TO TEST FOR COVID AS DIRECTED ON PACKAGE INSERT.     hydrALAZINE (APRESOLINE) 100 MG tablet Take 100 mg by mouth 3 (three) times daily.     hydrocortisone cream 1 % Apply to affected area 2 times daily 15 g 0   hydrOXYzine (ATARAX/VISTARIL) 25 MG tablet Take 25 mg by mouth at bedtime.     ID NOW COVID-19 KIT TEST AS DIRECTED TODAY     insulin glargine (LANTUS) 100 UNIT/ML injection Inject into the skin.     JARDIANCE 25 MG TABS tablet Take 25 mg by mouth daily.     lidocaine (LIDODERM) 5 % Place 1 patch onto the skin daily as needed  (pain). Remove & Discard patch within 12 hours or as directed by MD (Patient not taking: No sig reported)     meloxicam (MOBIC) 15 MG tablet Take 15 mg by mouth daily as needed for pain.     methocarbamol (ROBAXIN) 500 MG tablet Take 500 mg by mouth 2 (two) times daily.     metoprolol tartrate (LOPRESSOR) 25 MG tablet Take 25 mg by mouth 2 (two) times daily.     NOVOLOG FLEXPEN 100 UNIT/ML FlexPen Inject into the skin.     Omega-3 Fatty Acids (FISH OIL) 1000 MG CAPS Take by mouth. (Patient not taking: No sig reported)     Ondansetron HCl (ZOFRAN PO) Take by mouth. (Patient not taking: Reported on 06/04/2021)     OVER THE COUNTER MEDICATION Take 1 capsule by mouth daily. Active Lean Keto Dietary Supplement (Patient not taking: No sig reported)     oxyCODONE (OXY IR/ROXICODONE) 5 MG immediate release tablet Take 1 tablet (5 mg total) by mouth every 6 (six) hours as needed for moderate pain, severe pain or breakthrough pain. (Patient not taking: No sig reported) 25 tablet 0   POLY-IRON 150 150 MG capsule Take 150 mg by mouth 2 (two) times daily.     PREVIDENT 5000 SENSITIVE 1.1-5 % PSTE Take 1 application by mouth See admin instructions. After regular brushing, flossing and rinsing, brush a pea size of this paste for 2 minutes then spit it out thoroughly (Patient not taking: No sig reported)  4   rosuvastatin (CRESTOR) 40 MG tablet Take 40 mg by mouth at bedtime.      traMADol (ULTRAM) 50 MG tablet Take 50 mg by mouth daily.     No current facility-administered medications on file prior to visit.    Allergies  Allergen Reactions   Penicillins Other (See Comments)    PATIENT HAS HAD A PCN REACTION WITH IMMEDIATE RASH, FACIAL/TONGUE/THROAT SWELLING, SOB, OR LIGHTHEADEDNESS WITH HYPOTENSION:  #  #  YES  #  #  Has patient had a PCN reaction causing severe rash involving mucus membranes or skin necrosis: No Has patient had a PCN reaction that required hospitalization No Has patient had a PCN reaction  occurring within the last 10 years: No    Other Hives, Itching and  Rash    Powder in gloves Arthropod Insect   Social History   Occupational History   Occupation: care giver    Employer: [employer]  Tobacco Use   Smoking status: Never   Smokeless tobacco: Never  Vaping Use   Vaping Use: Never used  Substance and Sexual Activity   Alcohol use: No   Drug use: No   Sexual activity: Never    Birth control/protection: None, Abstinence   Family History  Problem Relation Age of Onset   Breast cancer Paternal Grandmother 42       breast; dbl mastectomy   Diabetes Father    Heart disease Father    Hypertension Father    Heart attack Father    Diabetes Mother    Heart disease Mother    Hypertension Mother    Diabetes Sister    Fibromyalgia Sister    Diabetes Sister    Heart attack Maternal Grandmother        <35   Heart attack Maternal Grandfather    Heart attack Paternal Grandfather    Diabetes Brother    Immunization History  Administered Date(s) Administered   Influenza Split 09/22/2015   Tdap 10/11/2012     Review of Systems: Negative except as noted in the HPI.   Objective: There were no vitals filed for this visit.  Alicia Coffey is a pleasant 51 y.o. female in NAD. AAO X 3.  Vascular Examination: CFT immediate b/l LE. Palpable DP/PT pulses b/l LE. Digital hair present b/l. Skin temperature gradient WNL b/l. No pain with calf compression b/l. No edema noted b/l. No cyanosis or clubbing noted b/l LE.  Dermatological Examination: Pedal integument with normal turgor, texture and tone BLE. No open wounds b/l LE. No interdigital macerations noted b/l LE. Toenails bilateral great toes and 3-5 bilaterally elongated, discolored, dystrophic, thickened, and crumbly with subungual debris and tenderness to dorsal palpation. Anonychia noted bilateral 2nd toes. Nailbed(s) epithelialized.  Hyperkeratotic lesion(s) bilateral 2nd toes.  No erythema, no edema, no  drainage, no fluctuance.  Musculoskeletal Examination: Muscle strength 5/5 to all lower extremity muscle groups bilaterally. Clawtoe deformity bilateral 2nd toes. Utilizes cane for ambulation assistance.  Footwear Assessment: Does the patient wear appropriate shoes? Yes. Does the patient need inserts/orthotics? Yes.  Neurological Examination: Pt has subjective symptoms of neuropathy. Protective sensation diminished with 10g monofilament b/l.  Assessment: 1. Pain due to onychomycosis of nail   2. Clawtoe, acquired, left   3. Claw foot, acquired, right   4. Pes planus of both feet   5. Diabetic peripheral neuropathy associated with type 2 diabetes mellitus ([place])   6. Encounter for diabetic foot exam ([place])    ADA Risk Categorization: High Risk  Patient has one or more of the following: Loss of protective sensation Severe Foot deformity History of foot ulcer left 2nd toe  Plan: -Diabetic foot examination performed today. -Continue foot and shoe inspections daily. Monitor blood glucose per PCP/Endocrinologist's recommendations. -Mycotic toenails were debrided in length and girth bilateral great toes and 3-5 bilaterally with sterile nail nippers and dremel without iatrogenic bleeding. Continue daily use of topical compounded antifungal medication from [place] as instructed. -Preulcerative lesion pared bilateral 2nd toes. Total number pared=2. -Patient to report any pedal injuries to medical professional immediately. -Continue [place] shoe gear daily. -Patient/POA to call should there be question/concern in the interim.  Return in about 3 months (around 02/08/2022).  Marzetta Board, DPM

## 2021-11-16 ENCOUNTER — Encounter: Payer: Self-pay | Admitting: Podiatry

## 2022-01-02 DIAGNOSIS — R69 Illness, unspecified: Secondary | ICD-10-CM | POA: Diagnosis not present

## 2022-01-02 DIAGNOSIS — F0631 Mood disorder due to known physiological condition with depressive features: Secondary | ICD-10-CM | POA: Diagnosis not present

## 2022-01-13 ENCOUNTER — Other Ambulatory Visit: Payer: Self-pay | Admitting: Adult Health

## 2022-01-13 ENCOUNTER — Ambulatory Visit
Admission: RE | Admit: 2022-01-13 | Discharge: 2022-01-13 | Disposition: A | Discharge status: Home / Self Care | Payer: Medicaid Other | Source: Ambulatory Visit | Attending: Adult Health | Admitting: Adult Health

## 2022-01-13 DIAGNOSIS — N6320 Unspecified lump in the left breast, unspecified quadrant: Secondary | ICD-10-CM

## 2022-01-13 DIAGNOSIS — R928 Other abnormal and inconclusive findings on diagnostic imaging of breast: Secondary | ICD-10-CM

## 2022-02-10 ENCOUNTER — Encounter: Payer: Self-pay | Admitting: Podiatry

## 2022-02-10 ENCOUNTER — Ambulatory Visit (INDEPENDENT_AMBULATORY_CARE_PROVIDER_SITE_OTHER): Payer: Medicaid Other | Admitting: Podiatry

## 2022-02-10 DIAGNOSIS — M79609 Pain in unspecified limb: Secondary | ICD-10-CM | POA: Diagnosis not present

## 2022-02-10 DIAGNOSIS — B351 Tinea unguium: Secondary | ICD-10-CM

## 2022-02-10 DIAGNOSIS — E0843 Diabetes mellitus due to underlying condition with diabetic autonomic (poly)neuropathy: Secondary | ICD-10-CM

## 2022-02-19 NOTE — Progress Notes (Signed)
?  Subjective:  ?Patient ID: Alicia Coffey, female    DOB: [birth year],  MRN: [id number] ? ?Alicia Coffey presents to clinic today for at risk foot care with history of diabetic neuropathy, painful thick toenails that are difficult to trim. Pain interferes with ambulation. Aggravating factors include wearing enclosed shoe gear. Pain is relieved with periodic professional debridement., and preulcerative lesion(s) bilateral 2nd toes. ? ?Last known HgA1c was 8.4%. Patient did not check blood glucose today ? ?New problem(s): None.  ? ?PCP is Vonna Drafts, FNP , and last visit was Feb 09, 2022. ? ?Allergies  ?Allergen Reactions  ? Penicillins Other (See Comments)  ?  PATIENT HAS HAD A PCN REACTION WITH IMMEDIATE RASH, FACIAL/TONGUE/THROAT SWELLING, SOB, OR LIGHTHEADEDNESS WITH HYPOTENSION:  #  #  YES  #  #  ?Has patient had a PCN reaction causing severe rash involving mucus membranes or skin necrosis: No ?Has patient had a PCN reaction that required hospitalization No ?Has patient had a PCN reaction occurring within the last 10 years: No ?  ? Other Hives, Itching and Rash  ?  Powder in gloves ?Arthropod Insect  ? ? ?Review of Systems: Negative except as noted in the HPI. ? ?Objective: No changes noted in today's physical examination. ?There were no vitals filed for this visit. ? ?Alicia Coffey is a pleasant 51 y.o. female in NAD. AAO X 3. ? ?Vascular Examination: ?CFT immediate b/l LE. Palpable DP/PT pulses b/l LE. Digital hair present b/l. Skin temperature gradient WNL b/l. No pain with calf compression b/l. No edema noted b/l. No cyanosis or clubbing noted b/l LE. ? ?Dermatological Examination: ?Pedal integument with normal turgor, texture and tone BLE. No open wounds b/l LE. No interdigital macerations noted b/l LE. Toenails bilateral great toes and 3-5 bilaterally elongated, discolored, dystrophic, thickened, and crumbly with subungual debris and tenderness to dorsal palpation. Anonychia noted bilateral 2nd  toes. Nailbed(s) epithelialized.  Hyperkeratotic lesion(s) bilateral 2nd toes.  No erythema, no edema, no drainage, no fluctuance. ? ?Musculoskeletal Examination: ?Muscle strength 5/5 to all lower extremity muscle groups bilaterally. Clawtoe deformity bilateral 2nd toes. Utilizes cane for ambulation assistance. ? ?Neurological Examination: ?Pt has subjective symptoms of neuropathy. Protective sensation diminished with 10g monofilament b/l. ? ?Assessment/Plan: ?1. Pain due to onychomycosis of nail   ?2. Diabetes mellitus due to underlying condition with diabetic autonomic neuropathy, unspecified whether long term insulin use ([place])   ?  ? ?-Patient was evaluated and treated. All patient's and/or POA's questions/concerns answered on today's visit. ?-As a courtesy, preulcerative lesions pared to avoid development of ulcerations. ?-Patient to continue soft, supportive shoe gear daily. ?-Toenails 1-5 b/l were debrided in length and girth with sterile nail nippers and dremel without iatrogenic bleeding.  ?-Patient/POA to call should there be question/concern in the interim.  ? ?Return in about 3 months (around 05/13/2022). ? ?Marzetta Board, DPM  ?

## 2022-05-15 ENCOUNTER — Ambulatory Visit: Payer: Medicaid Other | Admitting: Podiatry

## 2022-05-20 ENCOUNTER — Ambulatory Visit (INDEPENDENT_AMBULATORY_CARE_PROVIDER_SITE_OTHER): Payer: Medicaid Other | Admitting: Adult Health

## 2022-05-20 ENCOUNTER — Encounter: Payer: Self-pay | Admitting: Adult Health

## 2022-05-20 VITALS — BP 96/61 | HR 80 | Ht 63.5 in | Wt 238.0 lb

## 2022-05-20 DIAGNOSIS — N951 Menopausal and female climacteric states: Secondary | ICD-10-CM | POA: Diagnosis not present

## 2022-05-20 DIAGNOSIS — R5383 Other fatigue: Secondary | ICD-10-CM | POA: Insufficient documentation

## 2022-05-20 DIAGNOSIS — R0609 Other forms of dyspnea: Secondary | ICD-10-CM

## 2022-05-20 DIAGNOSIS — R531 Weakness: Secondary | ICD-10-CM

## 2022-05-20 DIAGNOSIS — R232 Flushing: Secondary | ICD-10-CM

## 2022-05-20 DIAGNOSIS — Z853 Personal history of malignant neoplasm of breast: Secondary | ICD-10-CM | POA: Insufficient documentation

## 2022-05-20 NOTE — Progress Notes (Signed)
  Subjective:     Patient ID: [name], female   DOB: [birth year], 51 y.o.   MRN: [id number]  HPI Shayanna is a 51 year old black female,single, PM, in complaining of hot flashes, being tired and has shortness of breath with exertion. She says she gets hot if walking for too long, not related to blood sugars. She had cardiology consult last year, but told her would like another one. PCP is [name] FNP.  Lab Results  Component Value Date   DIAGPAP  04/22/2021    - Negative for intraepithelial lesion or malignancy (NILM)   [place] Negative 04/22/2021    Review of Systems +hot flashes +tired +shortness of breath with exertion Occasional swelling Has lost some weight Reviewed past medical,surgical, social and family history. Reviewed medications and allergies.     Objective:   Physical Exam BP 96/61 (BP Location: Right Arm, Patient Position: Sitting, Cuff Size: Normal)   Pulse 80   Ht 5' 3.5" (1.613 m)   Wt 238 lb (108 kg)   BMI 41.50 kg/m     Skin warm and dry. Neck: mid line trachea, normal thyroid, good ROM, no lymphadenopathy noted. Lungs: clear to ausculation bilaterally. Cardiovascular: regular rate and rhythm. No swelling in LE.  [facility] - 05/20/22 1506       Pregnancy Intention Screening   Does the patient want to become pregnant in the next year? No    Does the patient's partner want to become pregnant in the next year? No    Would the patient like to discuss contraceptive options today? No      Contraception Wrap Up   Current Method Abstinence    End Method Abstinence    Contraception Counseling Provided No             Assessment:     1. Hot flashes Discussed she can't take HRT due to breast cancer She is on Cymbalta   2. Tired Feels like legs give out if walks for a while Will get cardiology consult - Ambulatory referral to Cardiology  3. Dyspnea on exertion She is short of breath with any exertion, has had to get help from stores  Will  get cardiology consult - Ambulatory referral to Cardiology  4. Sweats, menopausal She says she pours sweat  5. History of breast cancer   6. Weakness Feels weak a lot She sees PCP regularly     Plan:     Follow up prn

## 2022-06-09 ENCOUNTER — Ambulatory Visit: Payer: Medicaid Other | Admitting: Podiatry

## 2022-06-27 LAB — GLUCOSE, POCT (MANUAL RESULT ENTRY): POC Glucose: 144 mg/dl — AB (ref 70–99)

## 2022-07-03 ENCOUNTER — Ambulatory Visit
Admission: RE | Admit: 2022-07-03 | Discharge: 2022-07-03 | Disposition: A | Discharge status: Home / Self Care | Payer: Medicaid Other | Source: Ambulatory Visit | Attending: Adult Health | Admitting: Adult Health

## 2022-07-03 DIAGNOSIS — N6320 Unspecified lump in the left breast, unspecified quadrant: Secondary | ICD-10-CM

## 2022-07-09 ENCOUNTER — Encounter: Payer: Self-pay | Admitting: Gastroenterology

## 2022-07-10 ENCOUNTER — Ambulatory Visit (INDEPENDENT_AMBULATORY_CARE_PROVIDER_SITE_OTHER): Payer: Medicaid Other | Admitting: Podiatry

## 2022-07-10 ENCOUNTER — Encounter: Payer: Self-pay | Admitting: Podiatry

## 2022-07-10 DIAGNOSIS — B351 Tinea unguium: Secondary | ICD-10-CM

## 2022-07-10 DIAGNOSIS — M79676 Pain in unspecified toe(s): Secondary | ICD-10-CM

## 2022-07-10 DIAGNOSIS — L84 Corns and callosities: Secondary | ICD-10-CM | POA: Diagnosis not present

## 2022-07-10 DIAGNOSIS — M79609 Pain in unspecified limb: Secondary | ICD-10-CM | POA: Diagnosis not present

## 2022-07-10 DIAGNOSIS — E1142 Type 2 diabetes mellitus with diabetic polyneuropathy: Secondary | ICD-10-CM

## 2022-07-10 NOTE — Progress Notes (Unsigned)
  Subjective:  Patient ID: Alicia Coffey, female    DOB: 19-Apr-1971,  MRN: [id number]  Alicia Coffey presents to clinic today for {jgcomplaint:23593}  Patient states blood glucose was *** mg/dl {Time; today/yesterday/ 2 days ago:19188}.  Last known  HgA1c was ***%.    Last known HgA1c was unknown.  Patient did not check blood glucose this morning.  Patient does not monitor blood glucose daily.  Patient is not required to monitor blood glucose daily.  New problem(s): None. {jgcomplaint:23593}  PCP is Vonna Drafts, FNP , and last visit was  {Time; dates multiple:15870}.  Allergies  Allergen Reactions   Penicillins Other (See Comments)    PATIENT HAS HAD A PCN REACTION WITH IMMEDIATE RASH, FACIAL/TONGUE/THROAT SWELLING, SOB, OR LIGHTHEADEDNESS WITH HYPOTENSION:  #  #  YES  #  #  Has patient had a PCN reaction causing severe rash involving mucus membranes or skin necrosis: No Has patient had a PCN reaction that required hospitalization No Has patient had a PCN reaction occurring within the last 10 years: No    Other Hives, Itching and Rash    Powder in gloves Arthropod Insect    Review of Systems: Negative except as noted in the HPI.  Objective: No changes noted in today's physical examination.  Alicia Coffey is a pleasant 51 y.o. female {jgbodyhabitus:24098} AAO x 3. Vascular Examination: CFT immediate b/l LE. Palpable DP/PT pulses b/l LE. Digital hair present b/l. Skin temperature gradient WNL b/l. No pain with calf compression b/l. No edema noted b/l. No cyanosis or clubbing noted b/l LE.  Dermatological Examination: Pedal integument with normal turgor, texture and tone BLE. No open wounds b/l LE. No interdigital macerations noted b/l LE. Toenails bilateral great toes and 3-5 bilaterally elongated, discolored, dystrophic, thickened, and crumbly with subungual debris and tenderness to dorsal palpation. Anonychia noted bilateral 2nd toes. Nailbed(s) epithelialized.   Hyperkeratotic lesion(s) bilateral 2nd toes.  No erythema, no edema, no drainage, no fluctuance.  Musculoskeletal Examination: Muscle strength 5/5 to all lower extremity muscle groups bilaterally. Clawtoe deformity bilateral 2nd toes. Utilizes cane for ambulation assistance.  Neurological Examination: Pt has subjective symptoms of neuropathy. Protective sensation diminished with 10g monofilament b/l.  Assessment/Plan: 1. Pain due to onychomycosis of nail   2. Diabetic peripheral neuropathy associated with type 2 diabetes mellitus ([place])     No orders of the defined types were placed in this encounter. No orders of the defined types were placed in this encounter.   {Jgplan:23602::"-Patient/POA to call should there be question/concern in the interim."}   Return in about 3 months (around 10/09/2022).  Marzetta Board, DPM

## 2022-07-15 ENCOUNTER — Encounter: Payer: Self-pay | Admitting: Gastroenterology

## 2022-07-15 ENCOUNTER — Ambulatory Visit (INDEPENDENT_AMBULATORY_CARE_PROVIDER_SITE_OTHER): Payer: Medicaid Other | Admitting: Gastroenterology

## 2022-07-15 VITALS — BP 130/80 | HR 72 | Ht 63.5 in | Wt 244.1 lb

## 2022-07-15 DIAGNOSIS — R194 Change in bowel habit: Secondary | ICD-10-CM

## 2022-07-15 DIAGNOSIS — K921 Melena: Secondary | ICD-10-CM | POA: Diagnosis not present

## 2022-07-15 MED ORDER — NA SULFATE-K SULFATE-MG SULF 17.5-3.13-1.6 GM/177ML PO SOLN: 1.0000 | Freq: Once | ORAL | 0 refills | Status: AC

## 2022-07-15 NOTE — Progress Notes (Unsigned)
HPI : Alicia Coffey is a very pleasant 51 year old female with a history of anxiety, depression, chronic kidney disease, fibromyalgia, hypertension and diabetes who is referred to [name], FNP for initial screening colonoscopy and to evaluate numerous GI symptoms over the past few months.  The patient states that she has been having issues with irregular bowel habits, abdominal pain and blood in her stool. Patient states she started having issues several months ago.  She has had a few episodes of passage of significant amounts of bright red blood with clots.  The last one was about 2 months ago.  She states that she has significant abdominal pain with the urge to have a bowel movement, then will pass stool typically with a large amount of blood, and then the abdominal pain will improve.  The abdominal pain does not persist much longer after the bowel movement is over.  She has other episodes of small-volume hematochezia not associated with the abdominal pain.  Versed time she ever saw blood was maybe a year ago. She reports irregular bowel habits, with 1 of stools per day which are formed and solid, followed by episodes of loose "squirting" stools.  Rarely does she have straining or hard stools.  She denies any pain with the passage of stool.  She does report rectal pressure sometimes, but denies any lumps, bumps or protrusions around the anus.  She has never had a colonoscopy.  Her mother's aunt had colon cancer, otherwise no known family history of colon cancer.  The patient underwent a cardiac evaluation last year to include a myocardial perfusion scan and echocardiogram, both of which were unremarkable.   Past Medical History:  Diagnosis Date   Anemia    Anxiety    Arthritis    Right knee   CKD (chronic kidney disease) stage 2, GFR 60-89 ml/min    Dr. 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   Obstructive sleep apnea of adult 2017   Proteinuria    Type 2 diabetes mellitus ([place])    Vertigo 2015   Wears dentures      Past Surgical History:  Procedure Laterality Date   ABLATION     uterine    BREAST BIOPSY Left 2014   benign   BREAST BIOPSY Right 2013   high risk   BREAST BIOPSY Left 2021   benign   BREAST BIOPSY Left 2021   benign   BREAST EXCISIONAL BIOPSY Left    BREAST LUMPECTOMY Left 2021   LCIS   BREAST LUMPECTOMY WITH NEEDLE LOCALIZATION  08/15/2012   Procedure: BREAST LUMPECTOMY WITH NEEDLE LOCALIZATION;  Surgeon: Haywood Lasso, MD;  Location: [place];  Service: General;  Laterality: Right;  Wire localizations Right breast calcifications   BREAST LUMPECTOMY WITH RADIOACTIVE SEED LOCALIZATION Left 01/30/2020   Procedure: LEFT BREAST LUMPECTOMY X 2 WITH RADIOACTIVE SEED LOCALIZATION;  Surgeon: Coralie Keens, MD;  Location: [place];  Service: General;  Laterality: Left;   BREAST SURGERY     CARPAL TUNNEL RELEASE Right 11/24/2017   Procedure: RIGHT CARPAL TUNNEL RELEASE;  Surgeon: Ashok Pall, MD;  Location: [place];  Service: Neurosurgery;  Laterality: Right;  Right CARPAL TUNNEL RELEASE   CARPAL TUNNEL RELEASE Left 08/31/2018  Procedure: LEFT CARPAL TUNNEL RELEASE;  Surgeon: Ashok Pall, MD;  Location: [place];  Service: Neurosurgery;  Laterality: Left;   DILITATION & CURRETTAGE/HYSTROSCOPY WITH THERMACHOICE ABLATION N/A 08/21/2014   Procedure: DILATATION & CURETTAGE/HYSTEROSCOPY WITH THERMACHOICE ABLATION;  Surgeon: Jonnie Kind, MD;  Location: [facility];  Service: Gynecology;  Laterality: N/A;   KNEE ARTHROSCOPY Left 10/16/2015   Procedure: ARTHROSCOPY KNEE with debridment;  Surgeon: Frederik Pear, MD;  Location: [place];  Service: Orthopedics;  Laterality: Left;   KNEE ARTHROSCOPY WITH MEDIAL  MENISECTOMY Right 07/17/2015   Procedure: KNEE ARTHROSCOPY WITH MEDIAL MENISECTOMY;  Surgeon: Frederik Pear, MD;  Location: [place];  Service: Orthopedics;  Laterality: Right;   POLYPECTOMY N/A 08/21/2014   Procedure: ENDOMETRIAL POLYPECTOMY;  Surgeon: Jonnie Kind, MD;  Location: [facility];  Service: Gynecology;  Laterality: N/A;   REFRACTIVE SURGERY Right    micro aneuysms   ULNAR NERVE TRANSPOSITION Right 11/24/2017   Procedure: RIGHT ULNAR NERVE DECOMPRESSION/TRANSPOSITION;  Surgeon: Ashok Pall, MD;  Location: [place];  Service: Neurosurgery;  Laterality: Right;  Right ULNAR NERVE DECOMPRESSION/TRANSPOSITION   Family History  Problem Relation Age of Onset   Breast cancer Paternal Grandmother 42       breast; dbl mastectomy   Diabetes Father    Heart disease Father    Hypertension Father    Heart attack Father    Diabetes Mother    Heart disease Mother    Hypertension Mother    Diabetes Sister    Fibromyalgia Sister    Diabetes Sister    Heart attack Maternal Grandmother        <35   Heart attack Maternal Grandfather    Heart attack Paternal Grandfather    Diabetes Brother    Social History   Tobacco Use   Smoking status: Never   Smokeless tobacco: Never  Vaping Use   Vaping Use: Never used  Substance Use Topics   Alcohol use: No   Drug use: No   Current Outpatient Medications  Medication Sig Dispense Refill   acetaminophen (TYLENOL) 500 MG tablet Take 1,000 mg by mouth every 6 (six) hours as needed for mild pain or headache.     albuterol (PROVENTIL HFA;VENTOLIN HFA) 108 (90 Base) MCG/ACT inhaler Inhale 2 puffs into the lungs every 6 (six) hours as needed for wheezing or shortness of breath.     Aspirin 81 MG CAPS Take by mouth daily.     benazepril (LOTENSIN) 10 MG tablet Take by mouth.     bisacodyl (DULCOLAX) 5 MG EC tablet Take 5 mg by mouth 2 (two) times daily.      bumetanide (BUMEX) 0.5 MG tablet Take 1 mg by mouth 2 (two) times daily.       ciclopirox (LOPROX) 0.77 % cream Apply 1 application topically 2 (two) times daily as needed (itching).     clobetasol ointment (TEMOVATE) 1.93 % Apply 1 application topically 2 (two) times daily.     Docusate Sodium (DSS) 100 MG CAPS Take by mouth.     DULoxetine (CYMBALTA) 60 MG capsule Take 1 capsule (60 mg total) by mouth daily. (Patient taking differently: Take 60 mg by mouth at bedtime. Patient is taking 1 capsule 2 x daily) 30 capsule 12   EASY COMFORT PEN NEEDLES 31G X 8 MM MISC      Empagliflozin-metFORMIN HCl ER (SYNJARDY XR) 25-1000 MG TB24 Take by mouth.     Finerenone (KERENDIA) 10 MG TABS Take by mouth.  hydrALAZINE (APRESOLINE) 25 MG tablet Take 25 mg by mouth 4 (four) times daily.     hydrocortisone cream 1 % Apply to affected area 2 times daily 15 g 0   hydrOXYzine (ATARAX/VISTARIL) 25 MG tablet Take 25 mg by mouth at bedtime.     insulin glargine (LANTUS) 100 UNIT/ML injection Inject into the skin.     meloxicam (MOBIC) 15 MG tablet Take 15 mg by mouth daily as needed for pain.     methocarbamol (ROBAXIN) 500 MG tablet Take 500 mg by mouth 2 (two) times daily.     metoprolol tartrate (LOPRESSOR) 25 MG tablet Take 25 mg by mouth 2 (two) times daily.     NOVOLOG FLEXPEN 100 UNIT/ML FlexPen Inject into the skin.     POLY-IRON 150 150 MG capsule Take 150 mg by mouth 2 (two) times daily.     rosuvastatin (CRESTOR) 40 MG tablet Take 40 mg by mouth at bedtime.      traMADol (ULTRAM) 50 MG tablet Take 50 mg by mouth daily.     TRULICITY 3 JJ/9.4RD SOPN SMARTSIG:0.5 Milliliter(s) SUB-Q Once a Week     Cholecalciferol 25 MCG (1000 UT) capsule Take 1,000 Units by mouth 2 (two) times daily.     Continuous Blood Gluc Receiver (FREESTYLE LIBRE 2 READER) DEVI as directed.     Continuous Blood Gluc Sensor (FREESTYLE LIBRE 2 SENSOR) MISC See admin instructions.     cyanocobalamin 1000 MCG tablet Take by mouth.     cyclobenzaprine (FLEXERIL) 10 MG tablet  (Patient not taking: Reported on  07/15/2022)     LACTOBACILLUS PO Take by mouth.     Omega-3 Fatty Acids (FISH OIL) 1000 MG CAPS Take by mouth.     OVER THE COUNTER MEDICATION Take 1 capsule by mouth daily. Active Lean Keto Dietary Supplement     No current facility-administered medications for this visit.   Allergies  Allergen Reactions   Penicillins Other (See Comments)    PATIENT HAS HAD A PCN REACTION WITH IMMEDIATE RASH, FACIAL/TONGUE/THROAT SWELLING, SOB, OR LIGHTHEADEDNESS WITH HYPOTENSION:  #  #  YES  #  #  Has patient had a PCN reaction causing severe rash involving mucus membranes or skin necrosis: No Has patient had a PCN reaction that required hospitalization No Has patient had a PCN reaction occurring within the last 10 years: No    Other Hives, Itching and Rash    Powder in gloves Arthropod Insect     Review of Systems: All systems reviewed and negative except where noted in HPI.    MM DIAG BREAST TOMO BILATERAL  Result Date: 07/03/2022 CLINICAL DATA:  51 year old female presenting for annual bilateral mammogram in 1 year follow-up of probably benign left breast masses. History of bilateral breast surgeries. EXAM: DIGITAL DIAGNOSTIC BILATERAL MAMMOGRAM WITH TOMOSYNTHESIS; ULTRASOUND LEFT BREAST LIMITED TECHNIQUE: Bilateral digital diagnostic mammography and breast tomosynthesis was performed.; Targeted ultrasound examination of the left breast was performed. COMPARISON:  Previous exam(s). ACR Breast Density Category c: The breast tissue is heterogeneously dense, which may obscure small masses. FINDINGS: No new or suspicious mammographic findings are identified in either breast. Circumscribed masses within the upper-outer left breast is less conspicuous on today's mammographic views. The parenchymal pattern is otherwise unchanged. Targeted ultrasound is performed, showing stable appearance of 2 circumscribed masses at the 2 o'clock position 5 cm from the nipple. Today they measure 7 x 7 x 5 mm (previously 8 x  7 x 4 mm) and 6 x 4  x 3 mm (previously 7 x 5 x 3 mm). IMPRESSION: 1. Stable, probably benign left breast masses. Recommend a final follow-up in 1 year. 2. No mammographic evidence of malignancy on the right. RECOMMENDATION: Bilateral diagnostic mammogram and left breast ultrasound in 1 year. I have discussed the findings and recommendations with the patient. If applicable, a reminder letter will be sent to the patient regarding the next appointment. BI-RADS CATEGORY  3: Probably benign. Electronically Signed   By: Kristopher Oppenheim M.D.   On: 07/03/2022 11:54  US BREAST LTD UNI LEFT INC AXILLA  Result Date: 07/03/2022 CLINICAL DATA:  51 year old female presenting for annual bilateral mammogram in 1 year follow-up of probably benign left breast masses. History of bilateral breast surgeries. EXAM: DIGITAL DIAGNOSTIC BILATERAL MAMMOGRAM WITH TOMOSYNTHESIS; ULTRASOUND LEFT BREAST LIMITED TECHNIQUE: Bilateral digital diagnostic mammography and breast tomosynthesis was performed.; Targeted ultrasound examination of the left breast was performed. COMPARISON:  Previous exam(s). ACR Breast Density Category c: The breast tissue is heterogeneously dense, which may obscure small masses. FINDINGS: No new or suspicious mammographic findings are identified in either breast. Circumscribed masses within the upper-outer left breast is less conspicuous on today's mammographic views. The parenchymal pattern is otherwise unchanged. Targeted ultrasound is performed, showing stable appearance of 2 circumscribed masses at the 2 o'clock position 5 cm from the nipple. Today they measure 7 x 7 x 5 mm (previously 8 x 7 x 4 mm) and 6 x 4 x 3 mm (previously 7 x 5 x 3 mm). IMPRESSION: 1. Stable, probably benign left breast masses. Recommend a final follow-up in 1 year. 2. No mammographic evidence of malignancy on the right. RECOMMENDATION: Bilateral diagnostic mammogram and left breast ultrasound in 1 year. I have discussed the findings and  recommendations with the patient. If applicable, a reminder letter will be sent to the patient regarding the next appointment. BI-RADS CATEGORY  3: Probably benign. Electronically Signed   By: Kristopher Oppenheim M.D.   On: 07/03/2022 11:54   TTE: May 26, 2021 LVEF 60 to 65%, normal left ventricle function, mild left ventricular hypertrophy, normal right ventricular systolic function, trivial mitral valve regurgitation, trivial pericardial effusion, tricuspid aortic valve with mild calcification, no evidence of aortic stenosis  Myocardial perfusion scan May 26, 2021 No diagnostic ST segment changes to indicate ischemia. No significant myocardial perfusion defects on stress imaging to indicate ischemia. There is evidence of breast attenuation artifact. This is a low risk study. Nuclear stress EF: 60%.  Physical Exam: BP 130/80   Pulse 72   Ht 5' 3.5" (1.613 m)   Wt 244 lb 2 oz (110.7 kg)   BMI 42.57 kg/m  Constitutional: Pleasant,well-developed, African-American female in no acute distress. HEENT: Normocephalic and atraumatic. Conjunctivae are normal. No scleral icterus. Neck supple.  Cardiovascular: Normal rate, regular rhythm.  Pulmonary/chest: Effort normal and breath sounds normal. No wheezing, rales or rhonchi. Abdominal: Soft, nondistended, nontender. Bowel sounds active throughout. There are no masses palpable. No hepatomegaly. Extremities: no edema Rectal: Deferred until time of colonoscopy Neurological: Alert and oriented to person place and time. Skin: Skin is warm and dry. No rashes noted. Psychiatric: Normal mood and affect. Behavior is normal.  CBC    Component Value Date/Time   WBC 12.1 (H) 08/23/2018 0941   RBC 4.13 08/23/2018 0941   HGB 11.8 (L) 08/23/2018 0941   HGB 11.2 (L) 09/17/2015 1019   HCT 36.9 08/23/2018 0941   HCT 33.9 (L) 09/17/2015 1019   PLT 185 08/23/2018 0941  PLT 171 09/17/2015 1019   MCV 89.3 08/23/2018 0941   MCV 86.3 09/17/2015 1019   MCH  28.6 08/23/2018 0941   MCHC 32.0 08/23/2018 0941   RDW 13.0 08/23/2018 0941   RDW 13.3 09/17/2015 1019   LYMPHSABS 3.3 01/15/2016 1837   LYMPHSABS 2.6 09/17/2015 1019   MONOABS 0.7 01/15/2016 1837   MONOABS 0.6 09/17/2015 1019   EOSABS 0.5 01/15/2016 1837   EOSABS 0.4 09/17/2015 1019   BASOSABS 0.0 01/15/2016 1837   BASOSABS 0.0 09/17/2015 1019    CMP     Component Value Date/Time   NA 137 01/26/2020 0930   NA 139 09/17/2015 1019   K 4.4 01/26/2020 0930   K 4.4 09/17/2015 1019   CL 101 01/26/2020 0930   CL 106 01/09/2013 0855   CO2 25 01/26/2020 0930   CO2 25 09/17/2015 1019   GLUCOSE 181 (H) 01/26/2020 0930   GLUCOSE 140 09/17/2015 1019   GLUCOSE 169 (H) 01/09/2013 0855   BUN 13 01/26/2020 0930   BUN 23.6 09/17/2015 1019   CREATININE 1.02 (H) 01/26/2020 0930   CREATININE 1.2 (H) 09/17/2015 1019   CALCIUM 8.7 (L) 01/26/2020 0930   CALCIUM 9.3 09/17/2015 1019   PROT 6.9 09/17/2015 1019   ALBUMIN 3.6 09/17/2015 1019   AST 22 09/17/2015 1019   ALT 15 09/17/2015 1019   ALKPHOS 60 09/17/2015 1019   BILITOT 0.33 09/17/2015 1019   GFRNONAA >60 01/26/2020 0930   GFRAA >60 01/26/2020 0930     ASSESSMENT AND PLAN: 51 year old female with intermittent hematochezia and change in bowel habits in the last few months.  No diet changes to explain change in bowel habits.  Suspect her bleeding is most likely hemorrhoidal related, and given her polypharmacy, suspect these may be affecting her bowel habits.  A colonoscopy is indicated given her new onset hematochezia and change in bowel habits.  We will schedule her for a colonoscopy.  In the meantime, recommend she start taking Metamucil on a daily basis to improve her stool regularity.  The episodes of severe abdominal pain followed by hematochezia are somewhat suggestive of ischemic colitis, however her pain only lasts for a minute or 2 after bowel movement is over, which be very atypical for ischemic colitis.  Most likely, her pain is  related to spasm in her blood is from hemorrhoids.  Is possible both these symptoms will improve with fiber supplementation.  Hematochezia, likely hemorrhoidal - Colonoscopy  Change in bowel habits - Metamucil - Colonoscopy as above  The details, risks (including bleeding, perforation, infection, missed lesions, medication reactions and possible hospitalization or surgery if complications occur), benefits, and alternatives to colonoscopy with possible biopsy and possible polypectomy were discussed with the patient and she consents to proceed.   Alicia Coffey E. Candis Schatz, MD [facility]   CC:  Vonna Drafts, FNP

## 2022-07-15 NOTE — Patient Instructions (Signed)
_______________________________________________________  If you are age 53 or older, your body mass index should be between 23-30. Your Body mass index is 42.57 kg/m. If this is out of the aforementioned range listed, please consider follow up with your Primary Care Provider.  If you are age 28 or younger, your body mass index should be between 19-25. Your Body mass index is 42.57 kg/m. If this is out of the aformentioned range listed, please consider follow up with your Primary Care Provider.   You have been scheduled for a colonoscopy. Please follow written instructions given to you at your visit today.  Please pick up your prep supplies at the pharmacy within the next 1-3 days. If you use inhalers (even only as needed), please bring them with you on the day of your procedure.  Please purchase Metamucil over the counter. Take as directed.   The [facility] providers would like to encourage you to use [facility] to communicate with providers for non-urgent requests or questions.  Due to long hold times on the telephone, sending your provider a message by [facility] may be a faster and more efficient way to get a response.  Please allow 48 business hours for a response.  Please remember that this is for non-urgent requests.   It was a pleasure to see you today!  Thank you for trusting me with your gastrointestinal care!    Scott E.Candis Schatz, MD

## 2022-08-05 ENCOUNTER — Encounter: Payer: Self-pay | Admitting: Gastroenterology

## 2022-08-06 NOTE — Progress Notes (Signed)
Cardiology Office Note  Date: 08/07/2022   ID: Alicia Coffey, DOB Oct 14, 1970, MRN [id number]  PCP:  Vonna Drafts, FNP  Cardiologist:  Rozann Lesches, MD Electrophysiologist:  None   Chief Complaint  Patient presents with   Shortness of Breath    History of Present Illness: Alicia Coffey is a 51 y.o. female seen in consultation in July 2022 for evaluation of shortness of breath.  Cardiac testing was reassuring as noted below.  She is referred back to the office by Ms. Laurann Montana NP with the same concerns.  She is here today with her mother.  She states that she has been having recurring episodes with exertion or when she is up standing for long periods of time.  She experiences a lower back pain and weakness in her legs, shortness of breath and diaphoresis, very fatigued and has to sit down or stop what she is doing.  No frank chest discomfort.  She does mention a history of lumbar disc disease, has not had any prior surgeries.  No recent imaging studies of her spine.  She also reports neuropathy.  Today we discussed the results of her cardiac testing from last year.  ECG today shows sinus rhythm with leftward axis.  I reviewed her medications.  She does have continued concerns about her heart, personal history of hypertension, hyperlipidemia, and type 2 diabetes mellitus.  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  Past Medical History:  Diagnosis Date   Anemia    Anxiety    Arthritis    Right knee   CKD (chronic kidney disease) stage 2, GFR 60-89 ml/min    Dr. 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   Obstructive sleep apnea of adult 2017   Proteinuria    Type 2 diabetes mellitus ([facility])    Vertigo 2015   Wears dentures     Past Surgical History:  Procedure Laterality Date    ABLATION     uterine    BREAST BIOPSY Left 2014   benign   BREAST BIOPSY Right 2013   high risk   BREAST BIOPSY Left 2021   benign   BREAST BIOPSY Left 2021   benign   BREAST EXCISIONAL BIOPSY Left    BREAST LUMPECTOMY Left 2021   LCIS   BREAST LUMPECTOMY WITH NEEDLE LOCALIZATION  08/15/2012   Procedure: BREAST LUMPECTOMY WITH NEEDLE LOCALIZATION;  Surgeon: Haywood Lasso, MD;  Location: [facility];  Service: General;  Laterality: Right;  Wire localizations Right breast calcifications   BREAST LUMPECTOMY WITH RADIOACTIVE SEED LOCALIZATION Left 01/30/2020   Procedure: LEFT BREAST LUMPECTOMY X 2 WITH RADIOACTIVE SEED LOCALIZATION;  Surgeon: Coralie Keens, MD;  Location: [facility];  Service: General;  Laterality: Left;   BREAST SURGERY     CARPAL TUNNEL RELEASE Right 11/24/2017   Procedure: RIGHT CARPAL TUNNEL RELEASE;  Surgeon: Ashok Pall, MD;  Location: [facility];  Service: Neurosurgery;  Laterality: Right;  Right CARPAL TUNNEL RELEASE   CARPAL TUNNEL RELEASE Left 08/31/2018   Procedure: LEFT CARPAL TUNNEL RELEASE;  Surgeon: Ashok Pall, MD;  Location: [facility];  Service: Neurosurgery;  Laterality: Left;   DILITATION & CURRETTAGE/HYSTROSCOPY WITH THERMACHOICE ABLATION N/A 08/21/2014   Procedure: DILATATION & CURETTAGE/HYSTEROSCOPY  WITH THERMACHOICE ABLATION;  Surgeon: Jonnie Kind, MD;  Location: [facility];  Service: Gynecology;  Laterality: N/A;   KNEE ARTHROSCOPY Left 10/16/2015   Procedure: ARTHROSCOPY KNEE with debridment;  Surgeon: Frederik Pear, MD;  Location: [facility];  Service: Orthopedics;  Laterality: Left;   KNEE ARTHROSCOPY WITH MEDIAL MENISECTOMY Right 07/17/2015   Procedure: KNEE ARTHROSCOPY WITH MEDIAL MENISECTOMY;  Surgeon: Frederik Pear, MD;  Location: [facility];  Service: Orthopedics;  Laterality: Right;   POLYPECTOMY N/A 08/21/2014   Procedure: ENDOMETRIAL POLYPECTOMY;  Surgeon: Jonnie Kind, MD;   Location: [facility];  Service: Gynecology;  Laterality: N/A;   REFRACTIVE SURGERY Right    micro aneuysms   ULNAR NERVE TRANSPOSITION Right 11/24/2017   Procedure: RIGHT ULNAR NERVE DECOMPRESSION/TRANSPOSITION;  Surgeon: Ashok Pall, MD;  Location: [facility];  Service: Neurosurgery;  Laterality: Right;  Right ULNAR NERVE DECOMPRESSION/TRANSPOSITION    Current Outpatient Medications  Medication Sig Dispense Refill   acetaminophen (TYLENOL) 500 MG tablet Take 1,000 mg by mouth every 6 (six) hours as needed for mild pain or headache.     albuterol (PROVENTIL HFA;VENTOLIN HFA) 108 (90 Base) MCG/ACT inhaler Inhale 2 puffs into the lungs every 6 (six) hours as needed for wheezing or shortness of breath.     Aspirin 81 MG CAPS Take by mouth daily.     benazepril (LOTENSIN) 10 MG tablet Take 10 mg by mouth daily.     bisacodyl (DULCOLAX) 5 MG EC tablet Take 5 mg by mouth 2 (two) times daily.      bumetanide (BUMEX) 0.5 MG tablet Take 0.5 mg by mouth 2 (two) times daily.     ciclopirox (LOPROX) 0.77 % cream Apply 1 application topically 2 (two) times daily as needed (itching).     clobetasol ointment (TEMOVATE) 8.41 % Apply 1 application topically 2 (two) times daily.     Docusate Sodium (DSS) 100 MG CAPS Take 1 capsule by mouth 2 (two) times daily.     Dulaglutide 4.5 MG/0.5ML SOPN      DULoxetine (CYMBALTA) 60 MG capsule Take 1 capsule (60 mg total) by mouth daily. (Patient taking differently: Take 60 mg by mouth at bedtime. Patient is taking 1 capsule 2 x daily) 30 capsule 12   EASY COMFORT PEN NEEDLES 31G X 8 MM MISC      Empagliflozin-metFORMIN HCl ER (SYNJARDY XR) 25-1000 MG TB24 Take 1 tablet by mouth daily.     Finerenone (KERENDIA) 10 MG TABS Take 10 mg by mouth as directed. On Monday, Wednesday, & Friday     hydrALAZINE (APRESOLINE) 25 MG tablet Take 25 mg by mouth 2 (two) times daily.     hydrocortisone cream 1 % Apply to affected area 2 times daily (Patient taking differently: as needed for  itching. Apply to affected area 2 times daily) 15 g 0   hydrOXYzine (ATARAX/VISTARIL) 25 MG tablet Take 25 mg by mouth at bedtime.     insulin glargine (LANTUS) 100 UNIT/ML injection Inject 40 Units into the skin at bedtime.     meloxicam (MOBIC) 15 MG tablet Take 15 mg by mouth daily as needed for pain.     methocarbamol (ROBAXIN) 500 MG tablet Take 500 mg by mouth 2 (two) times daily.     metoprolol tartrate (LOPRESSOR) 100 MG tablet Take 1 tablet (100 mg total) by mouth once for 1 dose. 1 tablet 0   metoprolol tartrate (LOPRESSOR) 25 MG tablet Take 25 mg by mouth 2 (two) times  daily.     NOVOLOG FLEXPEN 100 UNIT/ML FlexPen Inject into the skin. Sliding scale     rosuvastatin (CRESTOR) 40 MG tablet Take 40 mg by mouth at bedtime.      traMADol (ULTRAM) 50 MG tablet Take 50 mg by mouth daily.     No current facility-administered medications for this visit.   Allergies:  Penicillins and Other   Social History: The patient  reports that she has never smoked. She has never used smokeless tobacco. She reports that she does not drink alcohol and does not use drugs.   Family History: The patient's family history includes Breast cancer (age of onset: 82) in her paternal grandmother; Diabetes in her brother, father, mother, sister, and sister; Fibromyalgia in her sister; Heart attack in her father, maternal grandfather, maternal grandmother, and paternal grandfather; Heart disease in her father and mother; Hypertension in her father and mother.   ROS: No palpitations or syncope.  Physical Exam: VS:  BP 136/86   Pulse 72   Ht 5' 3.5" (1.613 m)   Wt 244 lb 3.2 oz (110.8 kg)   SpO2 97%   BMI 42.58 kg/m , BMI Body mass index is 42.58 kg/m.  Wt Readings from Last 3 Encounters:  08/07/22 244 lb 3.2 oz (110.8 kg)  07/15/22 244 lb 2 oz (110.7 kg)  05/20/22 238 lb (108 kg)    General: Patient appears comfortable at rest. HEENT: Conjunctiva and lids normal. Neck: Supple, no elevated JVP or  carotid bruits. Lungs: Clear to auscultation, nonlabored breathing at rest. Cardiac: Regular rate and rhythm, no S3 or significant systolic murmur, no pericardial rub. Abdomen: Soft, nontender, bowel sounds present. Extremities: No pitting edema, distal pulses 2+. Skin: Warm and dry. Musculoskeletal: No kyphosis. Neuropsychiatric: Alert and oriented x3, affect grossly appropriate.  ECG:  An ECG dated 05/02/2021 was personally reviewed today and demonstrated:  Sinus rhythm with poor R wave progression.  Recent Labwork:  August 2023: Hemoglobin 13.0, platelets 202, BUN 27, creatinine 1.29, potassium 4.0, EGFR 50  Other Studies Reviewed Today:  Echocardiogram 05/26/2021:  1. Left ventricular ejection fraction, by estimation, is 60 to 65%. The  left ventricle has normal function. The left ventricle has no regional  wall motion abnormalities. There is mild left ventricular hypertrophy.  Left ventricular diastolic parameters  are indeterminate.   2. Right ventricular systolic function is normal. The right ventricular  size is normal. There is normal pulmonary artery systolic pressure. The  estimated right ventricular systolic pressure is 53.9 mmHg.   3. There is a trivial pericardial effusion posterior to the left  ventricle.   4. The mitral valve is grossly normal. Trivial mitral valve  regurgitation.   5. The aortic valve is tricuspid. There is mild calcification of the  aortic valve. Aortic valve regurgitation is not visualized. Mild aortic  valve sclerosis is present, with no evidence of aortic valve stenosis.   6. The inferior vena cava is normal in size with greater than 50%  respiratory variability, suggesting right atrial pressure of 3 mmHg.   Lexiscan Myoview 05/26/2021: No diagnostic ST segment changes to indicate ischemia. No significant myocardial perfusion defects on stress imaging to indicate ischemia. There is evidence of breast attenuation artifact. This is a low risk  study. Nuclear stress EF: 60%.  Assessment and Plan:  1.  Exertional shortness of breath and fatigue, often preceded by lower back pain and leg weakness, also with associated diaphoresis.  Cardiac work-up from last year including Lexiscan Myoview and  echocardiogram were reassuring.  LVEF normal and normal PASP, no ischemic defects.  She states that symptoms are worse at this point.  ECG is nonspecific.  Plan coronary CTA for anatomic evaluation of coronaries and calcium score to more clearly exclude angina pectoris as etiology.  I do wonder whether this could be a manifestation of lumbar disc disease with spinal stenosis precipitating symptoms and she may need further orthopedic work-up if her cardiac imaging study is reassuring.  2.  Essential hypertension, currently on Lotensin, hydralazine, and Lopressor.  3.  Mixed hyperlipidemia on Crestor.  4.  Type 2 diabetes mellitus, on insulin.  Medication Adjustments/Labs and Tests Ordered: Current medicines are reviewed at length with the patient today.  Concerns regarding medicines are outlined above.   Tests Ordered: Orders Placed This Encounter  Procedures   CT CORONARY MORPH W/CTA COR W/SCORE W/CA W/CM &/OR WO/CM   Basic metabolic panel   EKG 71-IRCV    Medication Changes: Meds ordered this encounter  Medications   metoprolol tartrate (LOPRESSOR) 100 MG tablet    Sig: Take 1 tablet (100 mg total) by mouth once for 1 dose.    Dispense:  1 tablet    Refill:  0    Disposition:  Follow up  test results.  Signed, Satira Sark, MD, [facility] 08/07/2022 11:06 AM    [facility], [place], [place] [address] Phone: [phone]; Fax: [phone]

## 2022-08-07 ENCOUNTER — Ambulatory Visit: Payer: Medicaid Other | Attending: Cardiology | Admitting: Cardiology

## 2022-08-07 ENCOUNTER — Encounter: Payer: Self-pay | Admitting: Cardiology

## 2022-08-07 VITALS — BP 136/86 | HR 72 | Ht 63.5 in | Wt 244.2 lb

## 2022-08-07 DIAGNOSIS — I10 Essential (primary) hypertension: Secondary | ICD-10-CM

## 2022-08-07 DIAGNOSIS — E782 Mixed hyperlipidemia: Secondary | ICD-10-CM

## 2022-08-07 DIAGNOSIS — R0609 Other forms of dyspnea: Secondary | ICD-10-CM | POA: Diagnosis not present

## 2022-08-07 DIAGNOSIS — E1142 Type 2 diabetes mellitus with diabetic polyneuropathy: Secondary | ICD-10-CM

## 2022-08-07 DIAGNOSIS — I209 Angina pectoris, unspecified: Secondary | ICD-10-CM

## 2022-08-07 DIAGNOSIS — Z794 Long term (current) use of insulin: Secondary | ICD-10-CM

## 2022-08-07 MED ORDER — METOPROLOL TARTRATE 100 MG PO TABS: 100.0000 mg | ORAL_TABLET | Freq: Once | ORAL | 0 refills | Status: DC

## 2022-08-07 NOTE — Patient Instructions (Addendum)
Medication Instructions:  Your physician recommends that you continue on your current medications as directed. Please refer to the Current Medication list given to you today.   Labwork: BMP ([facility]-within 6 weeks prior to testing)  Testing/Procedures:   Your cardiac CT will be scheduled at one of the below locations:   [facility] [address] [place], [place] [address] [phone]  If scheduled at [facility], please arrive at the [facility] and Children's Entrance (Entrance C2) of [facility] 30 minutes prior to test start time. You can use the FREE valet parking offered at entrance C (encouraged to control the heart rate for the test)  Proceed to the [facility] Radiology Department (first floor) to check-in and test prep.  All radiology patients and guests should use entrance C2 at [facility], accessed from [facility], even though the hospital's physical address listed is [address].      Please follow these instructions carefully (unless otherwise directed):  Please hold synjardy the morning of the test and 48 hours after the test.  On the Night Before the Test: Be sure to Drink plenty of water. Do not consume any caffeinated/decaffeinated beverages or chocolate 12 hours prior to your test. Do not take any hydroxyzine 12 hours prior to your test.  On the Day of the Test: Drink plenty of water until 1 hour prior to the test. Do not eat any food 4 hours prior to the test. You may take your regular medications prior to the test.  Take metoprolol (Lopressor) 100 mg two hours prior to test. PLEASE DO NOT TAKE YOUR METOPROLOL HOLD Furosemide/Hydrochlorothiazide morning of the test. FEMALES- please wear underwire-free bra if available, avoid dresses & tight clothing      After the Test: Drink plenty of water. After receiving IV contrast, you may experience a mild flushed feeling. This is normal. On occasion, you  may experience a mild rash up to 24 hours after the test. This is not dangerous. If this occurs, you can take Benadryl 25 mg and increase your fluid intake. If you experience trouble breathing, this can be serious. If it is severe call 911 IMMEDIATELY. If it is mild, please call our office. If you take any of these medications: Glipizide/Metformin, Avandament, Glucavance, please do not take 48 hours after completing test unless otherwise instructed.  We will call to schedule your test 2-4 weeks out understanding that some insurance companies will need an authorization prior to the service being performed.   For non-scheduling related questions, please contact the cardiac imaging nurse navigator should you have any questions/concerns: Marchia Bond, Cardiac Imaging Nurse Navigator Gordy Clement, Cardiac Imaging Nurse Navigator [facility] Direct Office Dial: [phone]   For scheduling needs, including cancellations and rescheduling, please call [place], [phone].   Follow-Up:  Your physician recommends that you schedule a follow-up appointment in: Follow Up Pending  Any Other Special Instructions Will Be Listed Below (If Applicable).  If you need a refill on your cardiac medications before your next appointment, please call your pharmacy.

## 2022-08-11 ENCOUNTER — Encounter: Payer: Self-pay | Admitting: Gastroenterology

## 2022-08-11 ENCOUNTER — Ambulatory Visit (AMBULATORY_SURGERY_CENTER): Payer: Medicaid Other | Admitting: Gastroenterology

## 2022-08-11 VITALS — BP 128/79 | HR 67 | Temp 97.1°F | Resp 17 | Ht 63.0 in | Wt 244.0 lb

## 2022-08-11 DIAGNOSIS — R194 Change in bowel habit: Secondary | ICD-10-CM

## 2022-08-11 DIAGNOSIS — K921 Melena: Secondary | ICD-10-CM | POA: Diagnosis not present

## 2022-08-11 MED ORDER — SODIUM CHLORIDE 0.9 % IV SOLN: 500.0000 mL | INTRAVENOUS | Status: DC

## 2022-08-11 NOTE — Progress Notes (Signed)
Report to PACU, RN, vss, BBS= Clear.  

## 2022-08-11 NOTE — Op Note (Signed)
[name]: Alicia Coffey Procedure Date: 08/11/2022 8:55 AM MRN: [id number] Endoscopist: Nicki Reaper E. Candis Schatz , MD, [id number] Age: 51 Referring MD:  Date of Birth: 12-31-1970 Gender: Female Account #: [id number] Procedure:                Colonoscopy Indications:              Hematochezia, Change in bowel habits Medicines:                Monitored Anesthesia Care Procedure:                Pre-Anesthesia Assessment:                           - Prior to the procedure, a History and Physical                            was performed, and patient medications and                            allergies were reviewed. The patient's tolerance of                            previous anesthesia was also reviewed. The risks                            and benefits of the procedure and the sedation                            options and risks were discussed with the patient.                            All questions were answered, and informed consent                            was obtained. Prior Anticoagulants: The patient has                            taken no anticoagulant or antiplatelet agents. ASA                            Grade Assessment: III - A patient with severe                            systemic disease. After reviewing the risks and                            benefits, the patient was deemed in satisfactory                            condition to undergo the procedure.                           After obtaining informed consent, the colonoscope  was passed under direct vision. Throughout the                            procedure, the patient's blood pressure, pulse, and                            oxygen saturations were monitored continuously. The                            CF [id number] #[id number] was introduced through the anus                            and advanced to the the terminal ileum, with                            identification  of the appendiceal orifice and IC                            valve. The colonoscopy was performed without                            difficulty. The patient tolerated the procedure                            well. The quality of the bowel preparation was                            good. The terminal ileum, ileocecal valve,                            appendiceal orifice, and rectum were photographed.                            The bowel preparation used was SUPREP via split                            dose instruction. Scope In: 9:14:56 AM Scope Out: 9:25:13 AM Scope Withdrawal Time: 0 hours 7 minutes 33 seconds  Total Procedure Duration: 0 hours 10 minutes 17 seconds  Findings:                 The perianal and digital rectal examinations were                            normal. Pertinent negatives include normal                            sphincter tone and no palpable rectal lesions.                           The colon (entire examined portion) appeared normal.                           The terminal ileum appeared normal.  The retroflexed view of the distal rectum and anal                            verge was normal and showed no anal or rectal                            abnormalities. Complications:            No immediate complications. Estimated Blood Loss:     Estimated blood loss: none. Impression:               - The entire examined colon is normal.                           - The examined portion of the ileum was normal.                           - The distal rectum and anal verge are normal on                            retroflexion view.                           - No specimens collected.                           - Although no prominent hemorrhoids seen on today's                            exam, the absence of any other potential bleeding                            sources makes internal hemorrhoids the most likely                            etiology  for the patient's hematochezia. Recommendation:           - Patient has a contact number available for                            emergencies. The signs and symptoms of potential                            delayed complications were discussed with the                            patient. Return to normal activities tomorrow.                            Written discharge instructions were provided to the                            patient.                           - Resume previous diet.                           -  Continue present medications.                           - Repeat colonoscopy in 10 years for screening                            purposes.                           - Continue daily fiber supplementation                           - Follow up as needed in the office for ongoing                            symptoms. Hezakiah Champeau E. Candis Schatz, MD 08/11/2022 9:32:22 AM This report has been signed electronically.

## 2022-08-11 NOTE — Progress Notes (Signed)
History and Physical Interval Note:  08/11/2022 9:01 AM  Alicia Coffey  has presented today for endoscopic procedure(s), with the diagnosis of  Encounter Diagnosis  Name Primary?   Change in bowel habits Yes  .  The various methods of evaluation and treatment have been discussed with the patient and/or family. After consideration of risks, benefits and other options for treatment, the patient has consented to  the endoscopic procedure(s).   The patient's history has been reviewed, patient examined, no change in status, stable for endoscopic procedure(s).  I have reviewed the patient's chart and labs.  Questions were answered to the patient's satisfaction.     Blessing Zaucha E. Candis Schatz, MD [facility]

## 2022-08-11 NOTE — Progress Notes (Signed)
Pt's states no medical or surgical changes since previsit or office visit. 

## 2022-08-11 NOTE — Patient Instructions (Signed)
Please read handouts provided. Continue present medications. Repeat colonoscopy in 10 years for screening. Continue daily fiber supplementation.  YOU HAD AN ENDOSCOPIC PROCEDURE TODAY AT [facility]:   Refer to the procedure report that was given to you for any specific questions about what was found during the examination.  If the procedure report does not answer your questions, please call your gastroenterologist to clarify.  If you requested that your care partner not be given the details of your procedure findings, then the procedure report has been included in a sealed envelope for you to review at your convenience later.  YOU SHOULD EXPECT: Some feelings of bloating in the abdomen. Passage of more gas than usual.  Walking can help get rid of the air that was put into your GI tract during the procedure and reduce the bloating. If you had a lower endoscopy (such as a colonoscopy or flexible sigmoidoscopy) you may notice spotting of blood in your stool or on the toilet paper. If you underwent a bowel prep for your procedure, you may not have a normal bowel movement for a few days.  Please Note:  You might notice some irritation and congestion in your nose or some drainage.  This is from the oxygen used during your procedure.  There is no need for concern and it should clear up in a day or so.  SYMPTOMS TO REPORT IMMEDIATELY:  Following lower endoscopy (colonoscopy or flexible sigmoidoscopy):  Excessive amounts of blood in the stool  Significant tenderness or worsening of abdominal pains  Swelling of the abdomen that is new, acute  Fever of 100F or higher.  For urgent or emergent issues, a gastroenterologist can be reached at any hour by calling [phone]. Do not use MyChart messaging for urgent concerns.    DIET:  We do recommend a small meal at first, but then you may proceed to your regular diet.  Drink plenty of fluids but you should avoid alcoholic beverages for  24 hours.  ACTIVITY:  You should plan to take it easy for the rest of today and you should NOT DRIVE or use heavy machinery until tomorrow (because of the sedation medicines used during the test).    FOLLOW UP: Our staff will call the number listed on your records the next business day following your procedure.  We will call around 7:15- 8:00 am to check on you and address any questions or concerns that you may have regarding the information given to you following your procedure. If we do not reach you, we will leave a message.     If any biopsies were taken you will be contacted by phone or by letter within the next 1-3 weeks.  Please call us at [phone] if you have not heard about the biopsies in 3 weeks.    SIGNATURES/CONFIDENTIALITY: You and/or your care partner have signed paperwork which will be entered into your electronic medical record.  These signatures attest to the fact that that the information above on your After Visit Summary has been reviewed and is understood.  Full responsibility of the confidentiality of this discharge information lies with you and/or your care-partner.

## 2022-08-15 LAB — BASIC METABOLIC PANEL
BUN: 19 mg/dL (ref 7–25)
CO2: 26 mmol/L (ref 20–32)
Calcium: 9.5 mg/dL (ref 8.6–10.4)
Chloride: 105 mmol/L (ref 98–110)
Creat: 1 mg/dL (ref 0.50–1.03)
Glucose, Bld: 110 mg/dL — ABNORMAL HIGH (ref 65–99)
Potassium: 4 mmol/L (ref 3.5–5.3)
Sodium: 141 mmol/L (ref 135–146)

## 2022-08-19 ENCOUNTER — Telehealth (HOSPITAL_COMMUNITY): Payer: Self-pay | Admitting: Emergency Medicine

## 2022-08-19 NOTE — Telephone Encounter (Signed)
Reaching out to patient to offer assistance regarding upcoming cardiac imaging study; pt verbalizes understanding of appt date/time, parking situation and where to check in, pre-test NPO status and medications ordered, and verified current allergies; name and call back number provided for further questions should they arise Marchia Bond RN Navigator Cardiac Imaging [facility] [phone] office [phone] cell  Arrival 300, WC entrance Denies iv issues  '100mg'$  metoprolol tartrate

## 2022-08-20 ENCOUNTER — Ambulatory Visit (HOSPITAL_COMMUNITY)
Admission: RE | Admit: 2022-08-20 | Discharge: 2022-08-20 | Disposition: A | Discharge status: Home / Self Care | Payer: Medicaid Other | Source: Ambulatory Visit | Attending: Cardiology | Admitting: Cardiology

## 2022-08-20 DIAGNOSIS — I209 Angina pectoris, unspecified: Secondary | ICD-10-CM | POA: Insufficient documentation

## 2022-08-20 MED ORDER — NITROGLYCERIN 0.4 MG SL SUBL
0.8000 mg | SUBLINGUAL_TABLET | Freq: Once | SUBLINGUAL | Status: AC
  Administered 2022-08-20: 0.8 mg via SUBLINGUAL

## 2022-08-20 MED ORDER — NITROGLYCERIN 0.4 MG SL SUBL
SUBLINGUAL_TABLET | SUBLINGUAL | Status: AC
  Filled 2022-08-20: qty 2

## 2022-08-20 MED ORDER — IOHEXOL 350 MG/ML SOLN
95.0000 mL | Freq: Once | INTRAVENOUS | Status: AC | PRN
  Administered 2022-08-20: 95 mL via INTRAVENOUS

## 2022-08-20 MED ORDER — METOPROLOL TARTRATE 5 MG/5ML IV SOLN
INTRAVENOUS | Status: AC
  Filled 2022-08-20: qty 5

## 2022-08-20 MED ORDER — METOPROLOL TARTRATE 5 MG/5ML IV SOLN
5.0000 mg | INTRAVENOUS | Status: DC | PRN
  Administered 2022-08-20: 5 mg via INTRAVENOUS

## 2022-08-20 MED ORDER — METOPROLOL TARTRATE 5 MG/5ML IV SOLN: 5.0000 mg | INTRAVENOUS | Status: DC | PRN

## 2022-08-20 MED ORDER — METOPROLOL TARTRATE 5 MG/5ML IV SOLN
INTRAVENOUS | Status: AC
  Administered 2022-08-20: 5 mg via INTRAVENOUS
  Filled 2022-08-20: qty 5

## 2022-08-25 ENCOUNTER — Telehealth: Payer: Self-pay | Admitting: *Deleted

## 2022-08-25 DIAGNOSIS — R0602 Shortness of breath: Secondary | ICD-10-CM

## 2022-08-25 NOTE — Telephone Encounter (Signed)
-----   Message from Satira Sark, MD sent at 08/21/2022  9:01 AM EST ----- Results reviewed.  Please let her know that the cardiac findings were very reassuring, calcium score is 0 and she had no evidence of significant CAD to explain her symptoms.  Incidentally noted lung findings were seen, small scattered cystic areas within the lungs that are most likely benign, she has no smoking history.  I would say however that given her symptoms she would probably benefit from formal Pulmonary consultation so that they can review this and see if she needs any additional studies or follow-up imaging.  No further cardiac testing is planned and she can keep follow-up with PCP otherwise.

## 2022-08-25 NOTE — Telephone Encounter (Signed)
Patient informed and verbalized understanding of plan. Copy sent to PCP 

## 2022-10-09 ENCOUNTER — Encounter: Payer: Self-pay | Admitting: Internal Medicine

## 2022-10-09 ENCOUNTER — Ambulatory Visit (INDEPENDENT_AMBULATORY_CARE_PROVIDER_SITE_OTHER): Payer: Medicaid Other | Admitting: Internal Medicine

## 2022-10-09 VITALS — BP 134/84 | HR 84 | Temp 98.4°F | Ht 63.5 in | Wt 236.0 lb

## 2022-10-09 DIAGNOSIS — R0609 Other forms of dyspnea: Secondary | ICD-10-CM

## 2022-10-09 DIAGNOSIS — Z79899 Other long term (current) drug therapy: Secondary | ICD-10-CM

## 2022-10-09 DIAGNOSIS — J984 Other disorders of lung: Secondary | ICD-10-CM | POA: Diagnosis not present

## 2022-10-09 DIAGNOSIS — I10 Essential (primary) hypertension: Secondary | ICD-10-CM

## 2022-10-09 MED ORDER — IRBESARTAN 150 MG PO TABS: 150.0000 mg | ORAL_TABLET | Freq: Every day | ORAL | 11 refills | Status: DC

## 2022-10-09 NOTE — Assessment & Plan Note (Signed)
Body mass index is 41.15 kg/m.    Lab Results  Component Value Date   TSH 1.852 03/13/2018      Contributing to doe and risk of GERD >>>   reviewed the need and the process to achieve and maintain neg calorie balance > defer f/u primary care including intermittently monitoring thyroid status    Each maintenance medication was reviewed in detail including emphasizing most importantly the difference between maintenance and prns and under what circumstances the prns are to be triggered using an action plan format where appropriate.  Total time for H and P, chart review, counseling, reviewing hfa  device(s) , directly observing portions of ambulatory 02 saturation study/ and generating customized AVS unique to this office visit / same day charting  > 45 min with pt new to me

## 2022-10-09 NOTE — Assessment & Plan Note (Addendum)
See CT coronary cuts from 08/20/22  >>> f/u hrct 02/18/23 placed in reminder file   Although there are clearly abnormalities on CT scan, they should probably be considered "microscopic" since not obvious on plain cxr .     In the setting of obvious "macroscopic" health issues,  I am very reluctatnt to embark on an invasive w/u at this point but will arrange consevative  follow up and in the meantime see what we can do to address the patient's subjective concerns.    Discussed in detail all the  indications, usual  risks and alternatives  relative to the benefits with patient who agrees to proceed with conservative f/u as outlined

## 2022-10-09 NOTE — Patient Instructions (Addendum)
Ok to try albuterol 15 min before an activity (on alternating days)  that you know would usually make you short of breath and see if it makes any difference and if makes none then don't take albuterol after activity unless you can't catch your breath as this means it's the resting that helps, not the albuterol.  Stop benazepril and start ibesartan 150 mg one daily   Return to our lab in about 2 weeks to check your blood for kidney function and call me with any issues with your blood pressure   Please schedule a follow up office visit in 6 weeks, call sooner if needed

## 2022-10-09 NOTE — Assessment & Plan Note (Signed)
Onset in her 40's at wt around 250  - 10/09/2022  wt 236  Walked on RA  x  3  lap(s) =  approx 450  ft  @ fast pace, stopped due to end of study min sob on last lap  with lowest 02 sats 94%   Symptoms are  disproportionate to objective findings and not clear to what extent this is actually a pulmonary  problem but pt does appear to have difficult to sort out respiratory symptoms of unknown origin for which  DDX  = almost all start with A and  include Adherence, Ace Inhibitors, Acid Reflux, Active Sinus Disease, Alpha 1 Antitripsin deficiency, Anxiety masquerading as Airways dz,  ABPA,  Allergy(esp in young), Aspiration (esp in elderly), Adverse effects of meds,  Active smoking or Vaping, A bunch of PE's/clot burden (a few small clots can't cause this syndrome unless there is already severe underlying pulm or vascular dz with poor reserve),  Anemia or thyroid disorder, plus two Bs  = Bronchiectasis and Beta blocker use..and one C= CHF    Adherence is always the initial "prime suspect" and is a multilayered concern that requires a "trust but verify" approach in every patient - starting with knowing how to use medications, especially inhalers, correctly, keeping up with refills and understanding the fundamental difference between maintenance and prns vs those medications only taken for a very short course and then stopped and not refilled.  - easily confused with names of meds in generic vs trade  - return Adherence is always the initial "prime suspect" and is a multilayered concern that requires a "trust but verify" approach in every patient - starting with knowing how to use medications, especially inhalers, correctly, keeping up with refills and understanding the fundamental difference between maintenance and prns vs those medications only taken for a very short course and then stopped and not refilled. 3  ACEi adverse effects at the  top of the usual list of suspects and the only way to rule it out is a  trial off > see a/p    ? Allergy/ asthma  > ok to use saba prn for now  Re SABA :  I spent extra time with pt today reviewing appropriate use of albuterol for prn use on exertion with the following points: 1) saba is for relief of sob that does not improve by walking a slower pace or resting but rather if the pt does not improve after trying this first. 2) If the pt is convinced, as many are, that saba helps recover from activity faster then it's easy to tell if this is the case by re-challenging : ie stop, take the inhaler, then p 5 minutes try the exact same activity (intensity of workload) that just caused the symptoms and see if they are substantially diminished or not after saba 3) if there is an activity that reproducibly causes the symptoms, try the saba 15 min before the activity on alternate days   If in fact the saba really does help, then fine to continue to use it prn but advised may need to look closer at the maintenance regimen being used to achieve better control of airways disease with exertion.   ? Anxiety/depression/ deconditioning  > usually at the bottom of this list of usual suspects but  note already on psychotropics and may interfere with adherence and also interpretation of response or lack thereof to symptom management which can be quite subjective.   ? Chf > cards  following with neg w/u so far

## 2022-10-09 NOTE — Progress Notes (Signed)
Alicia Coffey, female    DOB: [birth year]    MRN: [id number]   Brief patient profile:  31   yobf with fibromyalgia/ MO never smoker but problems with cough around heavy cig smoke in her early 12s referred to pulmonary clinic in [place]  10/09/2022 by Dr Johnny Bridge  for doe since her 39's  but worse x age 51 and not directly proportionate to activity    Wt at 30's  250   H/o breast ca on tamoxifen stopped around 2016    History of Present Illness  10/09/2022  Pulmonary/ 1st office eval/ [name] / [place] on benazapril  Chief Complaint  Patient presents with   Consult    SOB exertion- thinks it may be due to back pain Abnormal CT   Dyspnea:   somewhat reproducible @ [employer] shopping x 3-4 aisles but varies and sometimes assoc with audible wheeze/ sweating/ back pain neg cp  Cough: none  Sleep: no resp cc / 2 pillows  bothered by nasal congestion  SABA use: albuterol sometimes help 02: none  Minimal HB with ketchup    No obvious day to day or daytime pattern/variability or assoc excess/ purulent sputum or mucus plugs or hemoptysis or cp or chest tightness,      Also denies any obvious fluctuation of symptoms with weather or environmental changes or other aggravating or alleviating factors except as outlined above   No unusual exposure hx or h/o childhood pna/ asthma or knowledge of premature birth.  Current Allergies, Complete Past Medical History, Past Surgical History, Family History, and Social History were reviewed in [employer] record.  ROS  The following are not active complaints unless bolded Hoarseness, sore throat, dysphagia, dental problems, itching, sneezing,  nasal congestion or discharge of excess mucus or purulent secretions, ear ache,   fever, chills, sweats, unintended wt loss or wt gain, classically pleuritic or exertional cp,  orthopnea pnd or arm/hand swelling  or leg swelling, presyncope, palpitations, abdominal pain, anorexia,  nausea, vomiting, diarrhea  or change in bowel habits or change in bladder habits, change in stools or change in urine, dysuria, hematuria,  rash, arthralgias, visual complaints, headache, numbness, weakness or ataxia or problems with walking or coordination,  change in mood or  memory.             Past Medical History:  Diagnosis Date   Anemia    Anxiety    Arthritis    Right knee   CKD (chronic kidney disease) stage 2, GFR 60-89 ml/min    Dr. 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     albuterol (PROVENTIL HFA;VENTOLIN HFA) 108 (90 Base) MCG/ACT inhaler Inhale 2 puffs into the lungs every 6 (six) hours as needed for wheezing or shortness of breath.     Aspirin 81 MG CAPS Take by mouth daily.  bisacodyl (DULCOLAX) 5 MG EC tablet Take 5 mg by mouth 2 (two) times daily.      bumetanide (BUMEX) 0.5 MG tablet Take 0.5 mg by mouth 2 (two) times daily.     ciclopirox (LOPROX) 0.77 % cream Apply 1 application topically 2 (two) times daily as needed (itching).     clobetasol ointment (TEMOVATE) 0.10 % Apply 1 application topically 2 (two) times daily.     Docusate Sodium (DSS) 100 MG CAPS Take 1 capsule by mouth 2 (two) times daily.     Dulaglutide 4.5 MG/0.5ML SOPN      DULoxetine (CYMBALTA) 60 MG capsule Take 1 capsule (60 mg total) by mouth daily. (Patient taking differently: Take 60 mg by mouth at bedtime. Patient is taking 1 capsule 2 x daily) 30 capsule 12   EASY COMFORT PEN  NEEDLES 31G X 8 MM MISC      Empagliflozin-metFORMIN HCl ER (SYNJARDY XR) 25-1000 MG TB24 Take 1 tablet by mouth daily.     Finerenone (KERENDIA) 10 MG TABS Take 10 mg by mouth as directed. On Monday, Wednesday, & Friday     hydrALAZINE (APRESOLINE) 25 MG tablet Take 25 mg by mouth 2 (two) times daily.     hydrocortisone cream 1 % Apply to affected area 2 times daily (Patient taking differently: as needed for itching. Apply to affected area 2 times daily) 15 g 0   hydrOXYzine (ATARAX/VISTARIL) 25 MG tablet Take 25 mg by mouth at bedtime.     insulin glargine (LANTUS) 100 UNIT/ML injection Inject 40 Units into the skin at bedtime.     meloxicam (MOBIC) 15 MG tablet Take 15 mg by mouth daily as needed for pain.     methocarbamol (ROBAXIN) 500 MG tablet Take 500 mg by mouth 2 (two) times daily.     metoprolol tartrate (LOPRESSOR) 100 MG tablet Take 1 tablet (100 mg total) by mouth once for 1 dose. 1 tablet 0   metoprolol tartrate (LOPRESSOR) 25 MG tablet Take 25 mg by mouth 2 (two) times daily.     NOVOLOG FLEXPEN 100 UNIT/ML FlexPen Inject into the skin. Sliding scale     rosuvastatin (CRESTOR) 40 MG tablet Take 40 mg by mouth at bedtime.      traMADol (ULTRAM) 50 MG tablet Take 50 mg by mouth daily.     No facility-administered medications prior to visit.     Objective:     BP 134/84   Pulse 84   Temp 98.4 F (36.9 C)   Ht 5' 3.5" (1.613 m)   Wt 236 lb (107 kg)   SpO2 98% Comment: ra  BMI 41.15 kg/m   SpO2: 98 % (ra)  Amb MO bf nad   HEENT : Oropharynx  clear         NECK :  without  apparent JVD/ palpable Nodes/TM    LUNGS: no acc muscle use,  Nl contour chest which is clear to A and P bilaterally without cough on insp or exp maneuvers   CV:  RRR  no s3 or murmur or increase in P2, and no edema   ABD:  obese soft and nontender with nl inspiratory excursion in the supine position. No bruits or organomegaly appreciated   MS:  Nl gait/ ext warm without deformities Or  obvious joint restrictions  calf tenderness, cyanosis or clubbing    SKIN: warm and dry without lesions    NEURO:  alert, approp, nl sensorium with  no motor or cerebellar deficits  apparent.     I personally reviewed images and agree with radiology impression as follows:   Chest CT  08/20/22     1. Multiple scattered thin-walled lung cysts are identified bilaterally. These measure up to 8 mm. 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 Consider follow-up imaging with high-resolution CT of the chest in 6 months to assess for any temporal change in the appearance of the lungs. 2. Small hiatal hernia       Assessment   Dyspnea on exertion Onset in her 40's at wt around 250  - 10/09/2022  wt 236  Walked on RA  x  3  lap(s) =  approx 450  ft  @ fast pace, stopped due to end of study min sob on last lap  with lowest 02 sats 94%   Symptoms are  disproportionate to objective findings and not clear to what extent this is actually a pulmonary  problem but pt does appear to have difficult to sort out respiratory symptoms of unknown origin for which  DDX  = almost all start with A and  include Adherence, Ace Inhibitors, Acid Reflux, Active Sinus Disease, Alpha 1 Antitripsin deficiency, Anxiety masquerading as Airways dz,  ABPA,  Allergy(esp in young), Aspiration (esp in elderly), Adverse effects of meds,  Active smoking or Vaping, A bunch of PE's/clot burden (a few small clots can't cause this syndrome unless there is already severe underlying pulm or vascular dz with poor reserve),  Anemia or thyroid disorder, plus two Bs  = Bronchiectasis and Beta blocker use..and one C= CHF    Adherence is always the initial "prime suspect" and is a multilayered concern that requires a "trust but verify" approach in every patient - starting with knowing how to use medications, especially inhalers, correctly, keeping up with refills and  understanding the fundamental difference between maintenance and prns vs those medications only taken for a very short course and then stopped and not refilled.  - easily confused with names of meds in generic vs trade  - return Adherence is always the initial "prime suspect" and is a multilayered concern that requires a "trust but verify" approach in every patient - starting with knowing how to use medications, especially inhalers, correctly, keeping up with refills and understanding the fundamental difference between maintenance and prns vs those medications only taken for a very short course and then stopped and not refilled. 3  ACEi adverse effects at the  top of the usual list of suspects and the only way to rule it out is a trial off > see a/p    ? Allergy/ asthma  > ok to use saba prn for now  Re SABA :  I spent extra time with pt today reviewing appropriate use of albuterol for prn use on exertion with the following points: 1) saba is for relief of sob that does not improve by walking a slower pace or resting but rather if the pt does not improve after trying this first. 2) If the pt is convinced, as many are, that saba helps recover from activity faster then it's easy to tell if this is the case by re-challenging : ie stop, take the inhaler, then p 5 minutes try the exact same activity (intensity of workload) that just caused the symptoms and see if they are substantially diminished or not after saba 3) if there is an activity that reproducibly causes the symptoms, try the saba  15 min before the activity on alternate days   If in fact the saba really does help, then fine to continue to use it prn but advised may need to look closer at the maintenance regimen being used to achieve better control of airways disease with exertion.   ? Anxiety/depression/ deconditioning  > usually at the bottom of this list of usual suspects but  note already on psychotropics and may interfere with adherence and  also interpretation of response or lack thereof to symptom management which can be quite subjective.   ? Chf > cards following with neg w/u so far    Essential hypertension Try off acei 10/09/2022  due to atypical wheeze/doe   In the best review of chronic cough to date ( NEJM 2016 375 5726-2035) ,  ACEi are now felt to cause cough in up to  20% of pts which is a 4 fold increase from previous reports and does not include the variety of non-specific complaints we see in pulmonary clinic in pts on ACEi but previously attributed to another dx like  Copd/asthma and  include PNDS, throat and chest congestion, "bronchitis", unexplained dyspnea and noct "strangling" sensations, and hoarseness, but also  atypical /refractory GERD symptoms like dysphagia and "bad heartburn"   The only way I know  to prove this is not an "ACEi Case" is a trial off ACEi x a minimum of 6 weeks then regroup.   >>> try avapro 150 mg one daily and f/u with renal as planned / here p 6 weeks to pursue more aggressive w/u if indicated   Multiple idiopathic pulmonary cysts See CT coronary cuts from 08/20/22  >>> f/u hrct 02/18/23 placed in reminder file   Although there are clearly abnormalities on CT scan, they should probably be considered "microscopic" since not obvious on plain cxr .     In the setting of obvious "macroscopic" health issues,  I am very reluctatnt to embark on an invasive w/u at this point but will arrange consevative  follow up and in the meantime see what we can do to address the patient's subjective concerns.    Discussed in detail all the  indications, usual  risks and alternatives  relative to the benefits with patient who agrees to proceed with conservative f/u as outlined                    Morbid obesity due to excess calories ([place]) Body mass index is 41.15 kg/m.    Lab Results  Component Value Date   TSH 1.852 03/13/2018      Contributing to doe and risk of GERD >>>   reviewed the need  and the process to achieve and maintain neg calorie balance > defer f/u primary care including intermittently monitoring thyroid status    Each maintenance medication was reviewed in detail including emphasizing most importantly the difference between maintenance and prns and under what circumstances the prns are to be triggered using an action plan format where appropriate.  Total time for H and P, chart review, counseling, reviewing hfa  device(s) , directly observing portions of ambulatory 02 saturation study/ and generating customized AVS unique to this office visit / same day charting  > 45 min with pt new to me     Christinia Gully, MD 10/09/2022

## 2022-10-09 NOTE — Assessment & Plan Note (Addendum)
Try off acei 10/09/2022  due to atypical wheeze/doe   In the best review of chronic cough to date ( NEJM 2016 375 1610-9604) ,  ACEi are now felt to cause cough in up to  20% of pts which is a 4 fold increase from previous reports and does not include the variety of non-specific complaints we see in pulmonary clinic in pts on ACEi but previously attributed to another dx like  Copd/asthma and  include PNDS, throat and chest congestion, "bronchitis", unexplained dyspnea and noct "strangling" sensations, and hoarseness, but also  atypical /refractory GERD symptoms like dysphagia and "bad heartburn"   The only way I know  to prove this is not an "ACEi Case" is a trial off ACEi x a minimum of 6 weeks then regroup.   >>> try avapro 150 mg one daily and f/u with renal as planned / here p 6 weeks to pursue more aggressive w/u if indicated

## 2022-10-23 LAB — BASIC METABOLIC PANEL
BUN/Creatinine Ratio: 18 (ref 9–23)
BUN: 18 mg/dL (ref 6–24)
CO2: 24 mmol/L (ref 20–29)
Calcium: 9.7 mg/dL (ref 8.7–10.2)
Chloride: 106 mmol/L (ref 96–106)
Creatinine, Ser: 1.01 mg/dL — ABNORMAL HIGH (ref 0.57–1.00)
Glucose: 108 mg/dL — ABNORMAL HIGH (ref 70–99)
Potassium: 5.3 mmol/L — ABNORMAL HIGH (ref 3.5–5.2)
Sodium: 144 mmol/L (ref 134–144)
eGFR: 67 mL/min/{1.73_m2} (ref >59)

## 2022-10-26 ENCOUNTER — Telehealth: Payer: Self-pay

## 2022-10-26 NOTE — Telephone Encounter (Signed)
-----  Message from Tanda Rockers, MD sent at 10/23/2022  5:56 AM EST ----- Call patient :  Study is unremarkable, K is on high side so be sure she's not taking K supplements and no change rx otherwise

## 2022-10-26 NOTE — Telephone Encounter (Signed)
Called Pt with results of blood draw. Pt stated understanding and nothing further needed.

## 2022-10-28 ENCOUNTER — Encounter: Payer: Self-pay | Admitting: Podiatry

## 2022-10-28 ENCOUNTER — Ambulatory Visit (INDEPENDENT_AMBULATORY_CARE_PROVIDER_SITE_OTHER): Payer: Medicaid Other | Admitting: Podiatry

## 2022-10-28 VITALS — BP 129/81

## 2022-10-28 DIAGNOSIS — E1142 Type 2 diabetes mellitus with diabetic polyneuropathy: Secondary | ICD-10-CM | POA: Diagnosis not present

## 2022-10-28 DIAGNOSIS — L84 Corns and callosities: Secondary | ICD-10-CM | POA: Diagnosis not present

## 2022-10-28 DIAGNOSIS — B351 Tinea unguium: Secondary | ICD-10-CM

## 2022-10-28 DIAGNOSIS — M79676 Pain in unspecified toe(s): Secondary | ICD-10-CM | POA: Diagnosis not present

## 2022-10-28 NOTE — Progress Notes (Signed)
Subjective:  Patient ID: Alicia Coffey, female    DOB: July 01, 1971,  MRN: [id number]  Alicia Coffey presents to clinic today for at risk foot care with history of diabetic neuropathy and corn(s) bilateral 2nd toes, callus(es) b/l lower extremities and painful mycotic nails.  Pain interferes with ambulation. Aggravating factors include wearing enclosed shoe gear. Painful toenails interfere with ambulation. Aggravating factors include wearing enclosed shoe gear. Pain is relieved with periodic professional debridement. Painful corns and calluses are aggravated when weightbearing with and without shoegear. Pain is relieved with periodic professional debridement.  Chief Complaint  Patient presents with   Nail Problem    [facility] BS-94 A1C-7.7 PCP-Takela Anderson PCP VST-08/2022   New problem(s): None.   PCP is Vonna Drafts, FNP.  Allergies  Allergen Reactions   Penicillins Other (See Comments)    PATIENT HAS HAD A PCN REACTION WITH IMMEDIATE RASH, FACIAL/TONGUE/THROAT SWELLING, SOB, OR LIGHTHEADEDNESS WITH HYPOTENSION:  #  #  YES  #  #  Has patient had a PCN reaction causing severe rash involving mucus membranes or skin necrosis: No Has patient had a PCN reaction that required hospitalization No Has patient had a PCN reaction occurring within the last 10 years: No    Other Hives, Itching and Rash    Powder in gloves Arthropod Insect    Review of Systems: Negative except as noted in the HPI.  Objective: No changes noted in today's physical examination.  Vitals:   10/28/22 0931  BP: 129/81   Alicia Coffey is a pleasant 52 y.o. female morbidly obese in NAD. AAO x 3.  Vascular Examination: CFT immediate b/l LE. Palpable DP/PT pulses b/l LE. Digital hair present b/l. Skin temperature gradient WNL b/l. No pain with calf compression b/l. No edema noted b/l. No cyanosis or clubbing noted b/l LE.  Dermatological Examination: Pedal integument with normal turgor, texture and tone BLE.  No open wounds b/l LE. No interdigital macerations noted b/l LE.   Toenails bilateral great toes and 3-5 bilaterally elongated, discolored, dystrophic, thickened, and crumbly with subungual debris and tenderness to dorsal palpation. Anonychia noted bilateral 2nd toes. Nailbed(s) epithelialized.    Hyperkeratotic lesion(s) dorsal PIPJ left 5th toe and medial IPJ right great toe.  No erythema, no edema, no drainage, no fluctuance.  Preulcerative lesions distal tip bilateral 2nd toes  No erythema, no edema, no drainage, no fluctuance.  Musculoskeletal Examination: Muscle strength 5/5 to all lower extremity muscle groups bilaterally. Clawtoe deformity bilateral 2nd toes. Hammertoe 3-5 b/l. Utilizes cane for ambulation assistance.  Neurological Examination: Pt has subjective symptoms of neuropathy. Protective sensation diminished with 10g monofilament b/l.  Assessment/Plan: 1. Pain due to onychomycosis of nail   2. Corns and callosities   3. Diabetic peripheral neuropathy associated with type 2 diabetes mellitus (HCC)     No orders of the defined types were placed in this encounter.   -Consent given for treatment as described below: -Examined patient. -Continue daily use of toe tunnels for bilateral 2nd toes. -Medicaid ABN signed for this year. Patient consents for services of paring of lesions today. Copy has been placed in patient chart. -Patient to continue soft, supportive shoe gear daily. -Toenails 3-5 bilaterally, left great toe, and right great toe debrided in length and girth without iatrogenic bleeding with sterile nail nipper and dremel.  -Callus(es) left fifth digit and right great toe pared utilizing sterile scalpel blade without complication or incident. Total number debrided =2. -Preulcerative lesion pared bilateral 2nd toes utilizing sterile  scalpel blade. Total number pared=2. -Patient/POA to call should there be question/concern in the interim.   Return in about 3 months  (around 01/27/2023).  Marzetta Board, DPM

## 2022-11-25 ENCOUNTER — Ambulatory Visit (INDEPENDENT_AMBULATORY_CARE_PROVIDER_SITE_OTHER): Payer: Medicaid Other | Admitting: Internal Medicine

## 2022-11-25 ENCOUNTER — Encounter: Payer: Self-pay | Admitting: Internal Medicine

## 2022-11-25 VITALS — BP 128/72 | HR 84 | Ht 63.5 in | Wt 241.8 lb

## 2022-11-25 DIAGNOSIS — I10 Essential (primary) hypertension: Secondary | ICD-10-CM

## 2022-11-25 DIAGNOSIS — R0609 Other forms of dyspnea: Secondary | ICD-10-CM | POA: Diagnosis not present

## 2022-11-25 DIAGNOSIS — J984 Other disorders of lung: Secondary | ICD-10-CM

## 2022-11-25 NOTE — Assessment & Plan Note (Signed)
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  Will contact her p HRCT available   Discussed in detail all the  indications, usual  risks and alternatives  relative to the benefits with patient who agrees to proceed with w/u as outlined.     Each maintenance medication was reviewed in detail including emphasizing most importantly the difference between maintenance and prns and under what circumstances the prns are to be triggered using an action plan format where appropriate.  Total time for H and P, chart review, counseling, reviewing hfa device(s) , directly observing portions of ambulatory 02 saturation study/ and generating customized AVS unique to this office visit / same day charting > 30 min for multiple  refractory respiratory  symptoms of uncertain etiology

## 2022-11-25 NOTE — Assessment & Plan Note (Signed)
Try off acei 10/09/2022  due to atypical wheeze/doe > resolved 11/25/2022   Although even in retrospect it may not be clear the ACEi contributed to the pt's symptoms,  Pt improved off them and adding them back at this point or in the future would risk confusion in interpretation of non-specific respiratory symptoms to which this patient is prone  ie  Better not to muddy the waters here.   >>> rx avapro 150 mg daily / f/u per PCP

## 2022-11-25 NOTE — Patient Instructions (Signed)
Try prilosec otc 28m  Take 30-60 min before first meal of the day and Pepcid ac (famotidine) 20 mg one after supper until cough is completely gone for at least a week without the need for cough suppression  GERD (REFLUX)  is an extremely common cause of respiratory symptoms just like yours , many times with no obvious heartburn at all.    It can be treated with medication, but also with lifestyle changes including elevation of the head of your bed (ideally with 6 -8inch blocks under the headboard of your bed),  Smoking cessation, avoidance of late meals, excessive alcohol, and avoid fatty foods, chocolate, peppermint, colas, red wine, and acidic juices such as orange juice.  NO MINT OR MENTHOL PRODUCTS SO NO COUGH DROPS  USE SUGARLESS CANDY INSTEAD (Jolley ranchers or Stover's or Life Savers) or even ice chips will also do - the key is to swallow to prevent all throat clearing. NO OIL BASED VITAMINS - use powdered substitutes.  Avoid fish oil when coughing.

## 2022-11-25 NOTE — Assessment & Plan Note (Signed)
Onset in her 40's at wt around 250  - 10/09/2022  wt 236  Walked on RA  x  3  lap(s) =  approx 450  ft  @ fast pace, stopped due to end of study min sob on last lap  with lowest 02 sats 94%  - 11/25/2022   Walked on RA  x  3  lap(s) =  approx 450  ft  @ fast pace, stopped due to end of study  with lowest 02 sats 94%    Not really limited so much by breathing as by wt/ back issues > no further w/u for now

## 2022-11-25 NOTE — Progress Notes (Signed)
Alicia Coffey, female    DOB: December 20, 1970    MRN: [id number]   Brief patient profile:  89  yobf with fibromyalgia/ MO never smoker but problems with cough around heavy cig smoke in her early 24s referred to pulmonary clinic in [place]  10/09/2022 by Dr Johnny Bridge  for doe since her 32's  but worse x age 52 and not directly proportionate to activity    Wt at 50's  250   H/o breast ca on tamoxifen stopped around 2016    History of Present Illness  10/09/2022  Pulmonary/ 1st office eval/ [name] / [name] on benazapril  Chief Complaint  Patient presents with   Consult    SOB exertion- thinks it may be due to back pain Abnormal CT   Dyspnea:   somewhat reproducible @ [employer] shopping x 3-4 aisles but varies and sometimes assoc with audible wheeze/ sweating/ back pain neg cp  Cough: none  Sleep: no resp cc / 2 pillows  bothered by nasal congestion  SABA use: albuterol sometimes help 02: none  Minimal HB with ketchup   Rec Ok to try albuterol 15 min before an activity (on alternating days)  that you know would usually make you short of breath Stop benazepril and start ibesartan 150 mg one daily  Return to our lab in about 2 weeks to check your blood for kidney function and call me with any issues with your blood pressure     11/25/2022  f/u ov/[place] office/[name] re: doe/ cough / abn ct chest   Chief Complaint  Patient presents with   Follow-up    Still has a cough and need to clear throat   Dyspnea:  seems to correlate with being upright and assoc with low back pain, no longer happening every day / has not tried saba rx   Cough: still has globus sensation / loss of voice / using lots of mints Sleeping: bed is flat/ 2 pillows or nose gets stopped  SABA use: not using  02: none  Covid status: vax x 4 shots    No obvious day to day or daytime variability or assoc excess/ purulent sputum or mucus plugs or hemoptysis or cp or chest tightness, subjective wheeze or  overt sinus or hb symptoms.   Sleeping  without nocturnal  or early am exacerbation  of respiratory  c/o's or need for noct saba. Also denies any obvious fluctuation of symptoms with weather or environmental changes or other aggravating or alleviating factors except as outlined above   No unusual exposure hx or h/o childhood pna/ asthma or knowledge of premature birth.  Current Allergies, Complete Past Medical History, Past Surgical History, Family History, and Social History were reviewed in [employer] record.  ROS  The following are not active complaints unless bolded Hoarseness, sore throat, dysphagia, dental problems, itching, sneezing,  nasal congestion or discharge of excess mucus or purulent secretions, ear ache,   fever, chills, sweats, unintended wt loss or wt gain, classically pleuritic or exertional cp,  orthopnea pnd or arm/hand swelling  or leg swelling, presyncope, palpitations, abdominal pain, anorexia, nausea, vomiting, diarrhea  or change in bowel habits or change in bladder habits, change in stools or change in urine, dysuria, hematuria,  rash, arthralgias, visual complaints, headache, numbness, weakness or ataxia or problems with walking or coordination,  change in mood or  memory.        Current Meds  Medication Sig   acetaminophen (TYLENOL) 500  MG tablet Take 1,000 mg by mouth every 6 (six) hours as needed for mild pain or headache.   albuterol (PROVENTIL HFA;VENTOLIN HFA) 108 (90 Base) MCG/ACT inhaler Inhale 2 puffs into the lungs every 6 (six) hours as needed for wheezing or shortness of breath.   Aspirin 81 MG CAPS Take by mouth daily.   bisacodyl (DULCOLAX) 5 MG EC tablet Take 5 mg by mouth 2 (two) times daily.    bumetanide (BUMEX) 0.5 MG tablet Take 0.5 mg by mouth 2 (two) times daily.   ciclopirox (LOPROX) 0.77 % cream Apply 1 application topically 2 (two) times daily as needed (itching).   clobetasol ointment (TEMOVATE) AB-123456789 % Apply 1  application topically 2 (two) times daily.   Docusate Sodium (DSS) 100 MG CAPS Take 1 capsule by mouth 2 (two) times daily.   Dulaglutide 4.5 MG/0.5ML SOPN    DULoxetine (CYMBALTA) 60 MG capsule Take 1 capsule (60 mg total) by mouth daily. (Patient taking differently: Take 60 mg by mouth at bedtime. Patient is taking 1 capsule 2 x daily)   EASY COMFORT PEN NEEDLES 31G X 8 MM MISC    Empagliflozin-metFORMIN HCl ER (SYNJARDY XR) 25-1000 MG TB24 Take 1 tablet by mouth daily.   Finerenone (KERENDIA) 10 MG TABS Take 10 mg by mouth as directed. On Monday, Wednesday, & Friday   hydrALAZINE (APRESOLINE) 25 MG tablet Take 25 mg by mouth 2 (two) times daily.   hydrocortisone cream 1 % Apply to affected area 2 times daily (Patient taking differently: as needed for itching. Apply to affected area 2 times daily)   hydrOXYzine (ATARAX/VISTARIL) 25 MG tablet Take 25 mg by mouth at bedtime.   insulin glargine (LANTUS) 100 UNIT/ML injection Inject 40 Units into the skin at bedtime.   irbesartan (AVAPRO) 150 MG tablet Take 1 tablet (150 mg total) by mouth daily.   meloxicam (MOBIC) 15 MG tablet Take 15 mg by mouth daily as needed for pain.   methocarbamol (ROBAXIN) 500 MG tablet Take 500 mg by mouth 2 (two) times daily.   metoprolol tartrate (LOPRESSOR) 25 MG tablet Take 25 mg by mouth 2 (two) times daily.   NOVOLOG FLEXPEN 100 UNIT/ML FlexPen Inject into the skin. Sliding scale   rosuvastatin (CRESTOR) 40 MG tablet Take 40 mg by mouth at bedtime.    traMADol (ULTRAM) 50 MG tablet Take 50 mg by mouth daily.                       Past Medical History:  Diagnosis Date   Anemia    Anxiety    Arthritis    Right knee   CKD (chronic kidney disease) stage 2, GFR 60-89 ml/min    Dr. 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    Obstructive sleep apnea of adult 2017   Proteinuria    Type 2 diabetes mellitus ([place])    Vertigo 2015   Wears dentures       Objective:     Wt Readings from Last 3 Encounters:  11/25/22 241 lb 12.8 oz (109.7 kg)  10/09/22 236 lb (107 kg)  08/11/22 244 lb (110.7 kg)      Vital signs reviewed  11/25/2022  - Note at rest 02 sats  97% on RA   General appearance:     MO (By BMI) pleasant amb bf and     HEENT : Oropharynx  clear/ no longer throat clearing as much          NECK :  without  apparent JVD/ palpable Nodes/TM    LUNGS: no acc muscle use,  Nl contour chest which is clear to A and P bilaterally without cough on insp or exp maneuvers   CV:  RRR  no s3 or murmur or increase in P2, and no edema   ABD:  obese soft and nontender    MS:  Nl gait/ ext warm without deformities Or obvious joint restrictions  calf tenderness, cyanosis or clubbing    SKIN: warm and dry without lesions    NEURO:  alert, approp, nl sensorium with  no motor or cerebellar deficits apparent.     I personally reviewed images and agree with radiology impression as follows:   Chest CT  08/20/22     1. Multiple scattered thin-walled lung cysts are identified bilaterally. These measure up to 8 mm. 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 Consider follow-up imaging with high-resolution CT of the chest in 6 months to assess for any temporal change in the appearance of the lungs. 2. Small hiatal hernia       Assessment

## 2022-11-27 ENCOUNTER — Ambulatory Visit: Payer: Medicaid Other | Admitting: Internal Medicine

## 2022-12-28 ENCOUNTER — Encounter: Payer: Self-pay | Admitting: Podiatry

## 2022-12-28 ENCOUNTER — Ambulatory Visit (INDEPENDENT_AMBULATORY_CARE_PROVIDER_SITE_OTHER): Payer: Medicaid Other | Admitting: Podiatry

## 2022-12-28 ENCOUNTER — Ambulatory Visit (INDEPENDENT_AMBULATORY_CARE_PROVIDER_SITE_OTHER): Payer: Medicaid Other

## 2022-12-28 DIAGNOSIS — M778 Other enthesopathies, not elsewhere classified: Secondary | ICD-10-CM

## 2022-12-28 DIAGNOSIS — S92252A Displaced fracture of navicular [scaphoid] of left foot, initial encounter for closed fracture: Secondary | ICD-10-CM | POA: Diagnosis not present

## 2022-12-28 NOTE — Progress Notes (Signed)
Chief Complaint  Patient presents with   Foot Pain    Dorsal/lateral foot left - noticed a knot a couple weeks ago, out of town at the time, but doesn't remember an injury, has neuropathy-no pain   Diabetes    Last A1c was 7.5    HPI: 52 y.o. female presenting today as a reestablish new patient for evaluation of left foot swelling that she noticed about 2 weeks ago.  Patient does not recall any injury or incident to the area.  She does have a PMHx of diabetes mellitus.  Last A1c 7.5 according to the patient.  Presenting for further treatment and evaluation  Past Medical History:  Diagnosis Date   Anemia    Anxiety    Arthritis    Right knee   CKD (chronic kidney disease) stage 2, GFR 60-89 ml/min    Dr. 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   Obstructive sleep apnea of adult 2017   Proteinuria    Type 2 diabetes mellitus ([place])    Vertigo 2015   Wears dentures     Past Surgical History:  Procedure Laterality Date   ABLATION     uterine    BREAST BIOPSY Left 2014   benign   BREAST BIOPSY Right 2013   high risk   BREAST BIOPSY Left 2021   benign   BREAST BIOPSY Left 2021   benign   BREAST EXCISIONAL BIOPSY Left    BREAST LUMPECTOMY Left 2021   LCIS   BREAST LUMPECTOMY WITH NEEDLE LOCALIZATION  08/15/2012   Procedure: BREAST LUMPECTOMY WITH NEEDLE LOCALIZATION;  Surgeon: Haywood Lasso, MD;  Location: [facility];  Service: General;  Laterality: Right;  Wire localizations Right breast calcifications   BREAST LUMPECTOMY WITH RADIOACTIVE SEED LOCALIZATION Left 01/30/2020   Procedure: LEFT BREAST LUMPECTOMY X 2 WITH RADIOACTIVE SEED LOCALIZATION;  Surgeon: Coralie Keens, MD;  Location: [facility];  Service: General;  Laterality: Left;   BREAST  SURGERY     CARPAL TUNNEL RELEASE Right 11/24/2017   Procedure: RIGHT CARPAL TUNNEL RELEASE;  Surgeon: Ashok Pall, MD;  Location: [facility];  Service: Neurosurgery;  Laterality: Right;  Right CARPAL TUNNEL RELEASE   CARPAL TUNNEL RELEASE Left 08/31/2018   Procedure: LEFT CARPAL TUNNEL RELEASE;  Surgeon: Ashok Pall, MD;  Location: [facility];  Service: Neurosurgery;  Laterality: Left;   DILITATION & CURRETTAGE/HYSTROSCOPY WITH THERMACHOICE ABLATION N/A 08/21/2014   Procedure: DILATATION & CURETTAGE/HYSTEROSCOPY WITH THERMACHOICE ABLATION;  Surgeon: Jonnie Kind, MD;  Location: [facility];  Service: Gynecology;  Laterality: N/A;   KNEE ARTHROSCOPY Left 10/16/2015   Procedure: ARTHROSCOPY KNEE with debridment;  Surgeon: Frederik Pear, MD;  Location: [facility];  Service: Orthopedics;  Laterality: Left;   KNEE ARTHROSCOPY WITH MEDIAL MENISECTOMY Right 07/17/2015   Procedure: KNEE ARTHROSCOPY WITH MEDIAL MENISECTOMY;  Surgeon: Frederik Pear, MD;  Location: [facility];  Service: Orthopedics;  Laterality: Right;   POLYPECTOMY N/A 08/21/2014   Procedure: ENDOMETRIAL POLYPECTOMY;  Surgeon: Jonnie Kind, MD;  Location: [facility];  Service: Gynecology;  Laterality: N/A;   REFRACTIVE SURGERY Right    micro aneuysms   ULNAR NERVE TRANSPOSITION Right 11/24/2017   Procedure: RIGHT ULNAR NERVE DECOMPRESSION/TRANSPOSITION;  Surgeon:  Ashok Pall, MD;  Location: [facility];  Service: Neurosurgery;  Laterality: Right;  Right ULNAR NERVE DECOMPRESSION/TRANSPOSITION    Allergies  Allergen Reactions   Penicillins Other (See Comments)    PATIENT HAS HAD A PCN REACTION WITH IMMEDIATE RASH, FACIAL/TONGUE/THROAT SWELLING, SOB, OR LIGHTHEADEDNESS WITH HYPOTENSION:  #  #  YES  #  #  Has patient had a PCN reaction causing severe rash involving mucus membranes or skin necrosis: No Has patient had a PCN reaction that required hospitalization No Has patient had a PCN reaction occurring within the last 10  years: No    Other Hives, Itching and Rash    Powder in gloves Arthropod Insect     Physical Exam: General: The patient is alert and oriented x3 in no acute distress.  Dermatology: Skin is warm, dry and supple bilateral lower extremities. Negative for open lesions or macerations.  Vascular: Palpable pedal pulses bilaterally. Capillary refill within normal limits.  Moderate edema noted throughout the proximal portion of the left dorsal foot just distal to the ankle  Neurological: Light touch and protective threshold diminished  Musculoskeletal Exam: No pedal deformities noted.  No pain on palpation throughout the midfoot  Radiographic Exam LT foot 12/28/2022:  Normal osseous mineralization.  Chronic displaced fracture noted bisecting the navicular.  Findings would be concerning for Charcot neuroarthropathy versus traumatic injury.  Evidence of pes planus deformity noted with collapse of the medial longitudinal arch of the foot.  Medial deviation of the talar head.  Assessment: 1.  Navicular fracture left foot.  Cannot rule out Charcot neuroarthropathy   Plan of Care:  1. Patient evaluated. X-Rays reviewed.  2.  Cam boot dispensed.  WBAT 3.  Concern for possible Charcot neuroarthropathy.  Order placed for CT with contrast left foot 4.  Return to clinic after CT scan results to review results and discuss further treatment options      Edrick Kins, DPM [facility]  Dr. Edrick Kins, DPM    [address], [place] [address]                Office [phone]  Fax [phone]

## 2023-01-26 ENCOUNTER — Ambulatory Visit
Admission: RE | Admit: 2023-01-26 | Discharge: 2023-01-26 | Disposition: A | Discharge status: Home / Self Care | Payer: Medicaid Other | Source: Ambulatory Visit | Attending: Podiatry | Admitting: Podiatry

## 2023-01-26 DIAGNOSIS — S92252A Displaced fracture of navicular [scaphoid] of left foot, initial encounter for closed fracture: Secondary | ICD-10-CM

## 2023-01-28 ENCOUNTER — Telehealth: Payer: Self-pay | Admitting: *Deleted

## 2023-01-28 NOTE — Telephone Encounter (Signed)
Patient is calling for her CT results and further instructions,explained that the report is not in as of yet, physician will contact once reviewed, verbalized understanding.

## 2023-02-01 NOTE — Telephone Encounter (Signed)
Patient is calling for her CT scan results, are ready in epic MyChart, please advise.

## 2023-02-03 ENCOUNTER — Ambulatory Visit (INDEPENDENT_AMBULATORY_CARE_PROVIDER_SITE_OTHER): Payer: Medicaid Other | Admitting: Podiatry

## 2023-02-03 ENCOUNTER — Encounter: Payer: Self-pay | Admitting: Podiatry

## 2023-02-03 DIAGNOSIS — B351 Tinea unguium: Secondary | ICD-10-CM

## 2023-02-03 DIAGNOSIS — M79676 Pain in unspecified toe(s): Secondary | ICD-10-CM | POA: Diagnosis not present

## 2023-02-03 DIAGNOSIS — M79609 Pain in unspecified limb: Secondary | ICD-10-CM

## 2023-02-03 DIAGNOSIS — E0843 Diabetes mellitus due to underlying condition with diabetic autonomic (poly)neuropathy: Secondary | ICD-10-CM

## 2023-02-03 DIAGNOSIS — S92252A Displaced fracture of navicular [scaphoid] of left foot, initial encounter for closed fracture: Secondary | ICD-10-CM | POA: Diagnosis not present

## 2023-02-04 NOTE — Progress Notes (Signed)
Subjective:   Patient ID: Alicia Coffey, female   DOB: 52 y.o.   MRN: [id number]   HPI Patient presents with very painful thick nailbeds 1-5 both feet that she cannot cut   ROS      Objective:  Physical Exam  Neurovascular unchanged patient is in poor health thick yellow brittle nailbeds 1-5 both feet with a fracture left foot being treated currently by Dr. Logan Bores     Assessment:  Mycotic nail infection with pain 1-5 both feet along with fracture left foot with patient in boot     Plan:  Debrided nailbeds 1-5 both feet no angiogenic bleeding reappoint to recheck discussed fracture continue on same course and will see Dr. Logan Bores to review her CT scan which was done

## 2023-02-10 ENCOUNTER — Ambulatory Visit (INDEPENDENT_AMBULATORY_CARE_PROVIDER_SITE_OTHER): Payer: Medicaid Other | Admitting: Podiatry

## 2023-02-10 ENCOUNTER — Ambulatory Visit (INDEPENDENT_AMBULATORY_CARE_PROVIDER_SITE_OTHER): Payer: Medicaid Other

## 2023-02-10 DIAGNOSIS — S92252A Displaced fracture of navicular [scaphoid] of left foot, initial encounter for closed fracture: Secondary | ICD-10-CM

## 2023-02-10 HISTORY — DX: Displaced fracture of navicular (scaphoid) of left foot, initial encounter for closed fracture: S92.252A

## 2023-02-10 NOTE — Progress Notes (Signed)
Chief Complaint  Patient presents with   Fracture    Navicular fracture left foot.  Cannot rule out Charcot neuroarthropathy, rate of pain 6 out of 10, shooting pain, X-Rays done today, TX: cam boot, A1c- 8.7 BG- 173    HPI: 52 y.o. female presenting today for follow-up evaluation of navicular fracture to the left foot.  Patient does not recall any injury or incident to the area.  Suspected time of injury, 12/11/2022.  She does have a PMHx of diabetes mellitus.  Last A1c 7.5 according to the patient.  Last visit CT scan of the left foot was ordered.  She presents today to review the results and discuss further treatment options.  Patient currently WBAT in the cam boot as instructed.  Past Medical History:  Diagnosis Date   Anemia    Anxiety    Arthritis    Right knee   CKD (chronic kidney disease) stage 2, GFR 60-89 ml/min    Dr. 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   Obstructive sleep apnea of adult 2017   Proteinuria    Type 2 diabetes mellitus (HCC)    Vertigo 2015   Wears dentures     Past Surgical History:  Procedure Laterality Date   ABLATION     uterine    BREAST BIOPSY Left 2014   benign   BREAST BIOPSY Right 2013   high risk   BREAST BIOPSY Left 2021   benign   BREAST BIOPSY Left 2021   benign   BREAST EXCISIONAL BIOPSY Left    BREAST LUMPECTOMY Left 2021   LCIS   BREAST LUMPECTOMY WITH NEEDLE LOCALIZATION  08/15/2012   Procedure: BREAST LUMPECTOMY WITH NEEDLE LOCALIZATION;  Surgeon: Currie Paris, MD;  Location: [facility];  Service: General;  Laterality: Right;  Wire localizations Right breast calcifications   BREAST LUMPECTOMY WITH RADIOACTIVE SEED LOCALIZATION Left 01/30/2020   Procedure: LEFT BREAST LUMPECTOMY X 2 WITH RADIOACTIVE SEED LOCALIZATION;   Surgeon: Abigail Miyamoto, MD;  Location: [facility];  Service: General;  Laterality: Left;   BREAST SURGERY     CARPAL TUNNEL RELEASE Right 11/24/2017   Procedure: RIGHT CARPAL TUNNEL RELEASE;  Surgeon: Coletta Memos, MD;  Location: [facility];  Service: Neurosurgery;  Laterality: Right;  Right CARPAL TUNNEL RELEASE   CARPAL TUNNEL RELEASE Left 08/31/2018   Procedure: LEFT CARPAL TUNNEL RELEASE;  Surgeon: Coletta Memos, MD;  Location: [facility];  Service: Neurosurgery;  Laterality: Left;   DILITATION & CURRETTAGE/HYSTROSCOPY WITH THERMACHOICE ABLATION N/A 08/21/2014   Procedure: DILATATION & CURETTAGE/HYSTEROSCOPY WITH THERMACHOICE ABLATION;  Surgeon: Tilda Burrow, MD;  Location: [facility];  Service: Gynecology;  Laterality: N/A;   KNEE ARTHROSCOPY Left 10/16/2015   Procedure: ARTHROSCOPY KNEE with debridment;  Surgeon: Gean Birchwood, MD;  Location: [facility];  Service: Orthopedics;  Laterality: Left;   KNEE ARTHROSCOPY WITH MEDIAL MENISECTOMY Right 07/17/2015   Procedure: KNEE ARTHROSCOPY WITH MEDIAL MENISECTOMY;  Surgeon: Gean Birchwood, MD;  Location: [facility];  Service: Orthopedics;  Laterality: Right;   POLYPECTOMY N/A 08/21/2014   Procedure: ENDOMETRIAL POLYPECTOMY;  Surgeon: Tilda Burrow, MD;  Location: [facility];  Service: Gynecology;  Laterality: N/A;   REFRACTIVE SURGERY Right  micro aneuysms   ULNAR NERVE TRANSPOSITION Right 11/24/2017   Procedure: RIGHT ULNAR NERVE DECOMPRESSION/TRANSPOSITION;  Surgeon: Coletta Memos, MD;  Location: [facility];  Service: Neurosurgery;  Laterality: Right;  Right ULNAR NERVE DECOMPRESSION/TRANSPOSITION    Allergies  Allergen Reactions   Penicillins Other (See Comments)    PATIENT HAS HAD A PCN REACTION WITH IMMEDIATE RASH, FACIAL/TONGUE/THROAT SWELLING, SOB, OR LIGHTHEADEDNESS WITH HYPOTENSION:  #  #  YES  #  #  Has patient had a PCN reaction causing severe rash involving mucus membranes or skin necrosis: No Has  patient had a PCN reaction that required hospitalization No Has patient had a PCN reaction occurring within the last 10 years: No    Other Hives, Itching and Rash    Powder in gloves Arthropod Insect     Physical Exam: General: The patient is alert and oriented x3 in no acute distress.  Dermatology: Skin is warm, dry and supple bilateral lower extremities. Negative for open lesions or macerations.  Vascular: Palpable pedal pulses bilaterally. Capillary refill within normal limits.  Moderate edema noted throughout the proximal portion of the left dorsal foot just distal to the ankle  Neurological: Light touch and protective threshold diminished  Musculoskeletal Exam: No pedal deformities noted.  Today there is more pain and tenderness with palpation throughout the navicular compared to prior visit  Radiographic Exam LT foot 12/28/2022:  Normal osseous mineralization.  Chronic displaced fracture noted bisecting the navicular.  Findings would be concerning for Charcot neuroarthropathy versus traumatic injury.  Evidence of pes planus deformity noted with collapse of the medial longitudinal arch of the foot.  Medial deviation of the talar head.  CT FOOT LEFT WO CONTRAST 01/26/2023 IMPRESSION: 1. 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 2. Moderate osteoarthritis of the talonavicular joint with a moderate talonavicular joint effusion. 3. Mild osteoarthritis of the navicular-medial cuneiform joint. 4. Moderate osteoarthritis of the second TMT joint. Mild osteoarthritis of the third TMT joint and fourth TMT joint. Moderate osteoarthritis of the fifth TMT joint.  Assessment: 1.  Navicular fracture left foot w/ osteolysis of the fracture site   Plan of Care:  -Patient evaluated.  CT scan reviewed in detail with the patient.  -Due to the displacement of the navicular bone with osteolysis within the fracture site I do  believe the patient would benefit from surgical ORIF of the navicular.  I do not believe the fracture will heal and lead to osseous union without surgical intervention and reduction of the fracture fragment which would lead to further complication and potential breakdown of the foot.  This procedure was explained in detail to the patient.  Risk benefits advantages and disadvantages were explained in detail.  Postoperative recovery course was also explained.  She understands that she will need to be strictly nonweightbearing for 6-8 weeks postoperatively.  All patient questions were answered.  No guarantees were expressed or implied -Recommend clearance from PCP prior to surgery.  Although the patient is diabetic with elevated A1c, potential effects of nonoperative reduction of the navicular may be more detrimental than the risk of surgery at the moment.  Patient understands the increased risk of uncontrolled diabetes as it relates to increased risk of infection and postoperative healing. -Authorization for surgery was initiated today.  Surgery will consist of open reduction with internal fixation of navicular fracture left.  Harvesting of calcaneal bone autograft left -Return to clinic 1 week postop  *Currently on disability  Edrick Kins, DPM [facility]  Dr. Edrick Kins, DPM    [address], [place] [address]                Office [phone]  Fax [phone]

## 2023-02-11 ENCOUNTER — Telehealth: Payer: Self-pay | Admitting: Urology

## 2023-02-11 NOTE — Telephone Encounter (Signed)
DOS - 02/19/23  NAVICULAR ORIF LEFT --- 13086 BONE AUTOGRAFT LEFT --- 20900  UHC MEDICAID EFFECTIVE DATE - 04/11/20   PER UHC WEBSITE FOR CPT CODES 57846 AND 20900 Notification or Prior Authorization is not required for the requested services This [employer] Medicaid members plan does not currently require a prior authorization for these services. If you have general questions about the prior authorization requirements, please call us at [phone] or visit [email or web address] and select the state where you practice. The number above acknowledges your notification. Please write this number down for future reference. Notification or Prior Authorization does not confirm benefit coverage and is not a guarantee of coverage or payment.   Decision ID #: [id number]

## 2023-02-14 ENCOUNTER — Encounter (HOSPITAL_COMMUNITY): Payer: Self-pay | Admitting: Urgent Care

## 2023-02-15 ENCOUNTER — Encounter
Admission: RE | Admit: 2023-02-15 | Discharge: 2023-02-15 | Disposition: A | Discharge status: Home / Self Care | Payer: Medicaid Other | Source: Ambulatory Visit | Attending: Podiatry | Admitting: Podiatry

## 2023-02-15 VITALS — Ht 63.5 in | Wt 238.0 lb

## 2023-02-15 DIAGNOSIS — I10 Essential (primary) hypertension: Secondary | ICD-10-CM

## 2023-02-15 DIAGNOSIS — Z01812 Encounter for preprocedural laboratory examination: Secondary | ICD-10-CM

## 2023-02-15 HISTORY — DX: Polyneuropathy, unspecified: G62.9

## 2023-02-15 HISTORY — DX: Shortness of breath: R06.02

## 2023-02-15 HISTORY — DX: Gastro-esophageal reflux disease without esophagitis: K21.9

## 2023-02-15 HISTORY — DX: Other intervertebral disc degeneration, lumbosacral region without mention of lumbar back pain or lower extremity pain: M51.379

## 2023-02-15 HISTORY — DX: Dysphagia, oropharyngeal phase: R13.12

## 2023-02-15 HISTORY — DX: Pure hypercholesterolemia, unspecified: E78.00

## 2023-02-15 HISTORY — DX: Iron deficiency anemia, unspecified: D50.9

## 2023-02-15 HISTORY — DX: Morbid (severe) obesity due to excess calories: E66.01

## 2023-02-15 HISTORY — DX: Vitamin D deficiency, unspecified: E55.9

## 2023-02-15 HISTORY — DX: Body Mass Index (BMI) 40.0 and over, adult: Z684

## 2023-02-15 HISTORY — DX: Other intervertebral disc degeneration, lumbosacral region: M51.37

## 2023-02-15 HISTORY — DX: Secondary hyperparathyroidism of renal origin: N25.81

## 2023-02-15 HISTORY — DX: Leiomyoma of uterus, unspecified: D25.9

## 2023-02-15 NOTE — Patient Instructions (Addendum)
Your procedure is scheduled on: Friday, May 10 Report to the Registration Desk on the 1st floor of the [facility] To find out your arrival time, please call [phone] between 1PM - 3PM on: Thursday, May 9 If your arrival time is 6:00 am, do not arrive before that time as the [facility] entrance doors do not open until 6:00 am.  REMEMBER: Instructions that are not followed completely may result in serious medical risk, up to and including death; or upon the discretion of your surgeon and anesthesiologist your surgery may need to be rescheduled.  Do not eat food after midnight the night before surgery.  No gum chewing or hard candies.  You may however, drink water up to 2 hours before you are scheduled to arrive for your surgery. Do not drink anything within 2 hours of your scheduled arrival time.  One week prior to surgery: starting today, May 6 Stop aspirin, meloxicam and Anti-inflammatories (NSAIDS) such as Advil, Aleve, Ibuprofen, Motrin, Naproxen, Naprosyn and Aspirin based products such as Excedrin, Goody's Powder, BC Powder. Stop ANY OVER THE COUNTER supplements until after surgery. You may however, continue to take Tylenol if needed for pain up until the day of surgery.  Continue taking all prescribed medications with the exception of the following:  Trulicity - if you get it filled, do not take until AFTER surgery.  Synjardy - hold 2 days before surgery. Last day to take is Tuesday, May 7. Resume AFTER surgery.  TAKE ONLY THESE MEDICATIONS THE MORNING OF SURGERY WITH A SIP OF WATER:  Albuterol inhaler and bring to the hospital. Duloxetine Hydralazine Metoprolol  No Alcohol for 24 hours before or after surgery.  No Smoking including e-cigarettes for 24 hours before surgery.  No chewable tobacco products for at least 6 hours before surgery.  No nicotine patches on the day of surgery.  Do not use any "recreational" drugs for at least a week (preferably 2 weeks)  before your surgery.  Please be advised that the combination of cocaine and anesthesia may have negative outcomes, up to and including death. If you test positive for cocaine, your surgery will be cancelled.  On the morning of surgery brush your teeth with toothpaste and water, you may rinse your mouth with mouthwash if you wish. Do not swallow any toothpaste or mouthwash.  Use CHG Soap as directed on instruction sheet.  Do not wear jewelry, make-up, hairpins, clips or nail polish.  Do not wear lotions, powders, or perfumes.   Do not shave body hair from the neck down 48 hours before surgery.  Contact lenses, hearing aids and dentures may not be worn into surgery.  Do not bring valuables to the hospital. [facility] is not responsible for any missing/lost belongings or valuables.   Notify your doctor if there is any change in your medical condition (cold, fever, infection).  Wear comfortable clothing (specific to your surgery type) to the hospital.  After surgery, you can help prevent lung complications by doing breathing exercises.  Take deep breaths and cough every 1-2 hours. Your doctor may order a device called an Incentive Spirometer to help you take deep breaths.  If you are being discharged the day of surgery, you will not be allowed to drive home. You will need a responsible individual to drive you home and stay with you for 24 hours after surgery.   If you are taking public transportation, you will need to have a responsible individual with you.  Please call  the Pre-admissions Testing Dept. at [phone] if you have any questions about these instructions.  Surgery Visitation Policy:  Patients having surgery or a procedure may have two visitors.  Children under the age of 24 must have an adult with them who is not the patient.     Preparing for Surgery with CHLORHEXIDINE GLUCONATE (CHG) Soap  Chlorhexidine Gluconate (CHG) Soap  o An antiseptic cleaner that  kills germs and bonds with the skin to continue killing germs even after washing  o Used for showering the night before surgery and morning of surgery  Before surgery, you can play an important role by reducing the number of germs on your skin.  CHG (Chlorhexidine gluconate) soap is an antiseptic cleanser which kills germs and bonds with the skin to continue killing germs even after washing.  Please do not use if you have an allergy to CHG or antibacterial soaps. If your skin becomes reddened/irritated stop using the CHG.  1. Shower the NIGHT BEFORE SURGERY and the MORNING OF SURGERY with CHG soap.  2. If you choose to wash your hair, wash your hair first as usual with your normal shampoo.  3. After shampooing, rinse your hair and body thoroughly to remove the shampoo.  4. Use CHG as you would any other liquid soap. You can apply CHG directly to the skin and wash gently with a scrungie or a clean washcloth.  5. Apply the CHG soap to your body only from the neck down. Do not use on open wounds or open sores. Avoid contact with your eyes, ears, mouth, and genitals (private parts). Wash face and genitals (private parts) with your normal soap.  6. Wash thoroughly, paying special attention to the area where your surgery will be performed.  7. Thoroughly rinse your body with warm water.  8. Do not shower/wash with your normal soap after using and rinsing off the CHG soap.  9. Pat yourself dry with a clean towel.  10. Wear clean pajamas to bed the night before surgery.  12. Place clean sheets on your bed the night of your first shower and do not sleep with pets.  13. Shower again with the CHG soap on the day of surgery prior to arriving at the hospital.  14. Do not apply any deodorants/lotions/powders.  15. Please wear clean clothes to the hospital.

## 2023-02-16 ENCOUNTER — Encounter
Admission: RE | Admit: 2023-02-16 | Discharge: 2023-02-16 | Disposition: A | Discharge status: Home / Self Care | Payer: Medicaid Other | Source: Ambulatory Visit | Attending: Podiatry | Admitting: Podiatry

## 2023-02-16 DIAGNOSIS — I10 Essential (primary) hypertension: Secondary | ICD-10-CM | POA: Diagnosis not present

## 2023-02-16 DIAGNOSIS — Z01812 Encounter for preprocedural laboratory examination: Secondary | ICD-10-CM

## 2023-02-18 ENCOUNTER — Telehealth: Payer: Self-pay | Admitting: *Deleted

## 2023-02-18 ENCOUNTER — Encounter: Payer: Self-pay | Admitting: Podiatry

## 2023-02-18 ENCOUNTER — Ambulatory Visit (HOSPITAL_COMMUNITY)
Admission: RE | Admit: 2023-02-18 | Discharge: 2023-02-18 | Disposition: A | Discharge status: Home / Self Care | Payer: Medicaid Other | Source: Ambulatory Visit | Attending: Internal Medicine | Admitting: Internal Medicine

## 2023-02-18 DIAGNOSIS — J984 Other disorders of lung: Secondary | ICD-10-CM | POA: Insufficient documentation

## 2023-02-18 NOTE — Progress Notes (Addendum)
  Perioperative Services: Pre-Admission/Anesthesia Testing  Abnormal Lab Notification    Date: 02/18/23  Name: Alicia Coffey MRN:   [id number]  Re: Abnormal labs noted during PAT appointment   Provider(s) Notified: Felecia Shelling, DPM Notification mode: Routed and/or faxed via [facility]   ABNORMAL LAB VALUE(S): Lab Results  Component Value Date   GLUCOSE 201 (H) 02/17/2023    Notes: Patient with a T2DM diagnosis. She is currently on multi-agent therapy (dulaglutide, empagliflozin-metformin, insulin glargine, insulin aspart). Last Hgb A1c was 10.1% on 02/17/2023. In efforts to reduce the risk of developing SSI, or other potential perioperative complications, this communication is being sent in order to determine if patient is deemed to have adequate medical optimization, including preoperative glycemic control.   With that being said, the benefit of improving glycemic control must be weighed against the overall risk associated with delaying a necessary elective surgical procedure for this patient.   ADDENDUM --> 02/18/2023 @ 1449 PM:  Return communication received from Dr. Logan Coffey, DPM. Physician advising of plans to  postpone ORIF of the navicular. He noted that fracture will not heal on its own and that patient will eventually need surgery. With that being said, procedure is not urgent/emergent. DPM has contacted that patient to discuss that his recommendations are to work closely with PCP to optimize medication management of her diabetes and f/u with him after repeat A1C is <8.0 (ideally). Will forward copy of note to patient's PCP Alicia Coffey, Alicia Pile, FNP) to make them aware of the aforementioned.   Quentin Mulling, MSN, APRN, FNP-C, CEN [facility]  Peri-operative Services Nurse Practitioner Phone: [phone] 02/18/23 8:27 AM

## 2023-02-18 NOTE — Telephone Encounter (Signed)
-----   Message from Nyoka Cowden, MD sent at 10/09/2022 10:58 AM EST ----- Hrct chest needed around [facility] may 2024  pulmonary cysts

## 2023-02-18 NOTE — Telephone Encounter (Signed)
I called and spoke with the pt and notified that CT is due  She verbalized understanding  Order was placed

## 2023-02-19 ENCOUNTER — Ambulatory Visit: Admission: RE | Admit: 2023-02-19 | Payer: Medicaid Other | Source: Home / Self Care | Admitting: Podiatry

## 2023-02-19 ENCOUNTER — Encounter: Admission: RE | Payer: Self-pay | Source: Home / Self Care

## 2023-02-19 HISTORY — DX: Diaphragmatic hernia without obstruction or gangrene: K44.9

## 2023-02-19 SURGERY — OPEN REDUCTION INTERNAL FIXATION (ORIF) FOOT LISFRANC FRACTURE
Anesthesia: Choice | Site: Foot | Laterality: Left

## 2023-02-23 ENCOUNTER — Other Ambulatory Visit: Payer: Self-pay | Admitting: Podiatry

## 2023-02-23 DIAGNOSIS — S92252A Displaced fracture of navicular [scaphoid] of left foot, initial encounter for closed fracture: Secondary | ICD-10-CM

## 2023-02-24 ENCOUNTER — Encounter: Payer: Medicaid Other | Admitting: Podiatry

## 2023-03-03 ENCOUNTER — Encounter: Payer: Medicaid Other | Admitting: Podiatry

## 2023-03-17 ENCOUNTER — Encounter: Payer: Medicaid Other | Admitting: Podiatry

## 2023-03-18 ENCOUNTER — Encounter: Payer: Self-pay | Admitting: Podiatry

## 2023-03-18 ENCOUNTER — Telehealth: Payer: Self-pay

## 2023-03-18 DIAGNOSIS — S92252A Displaced fracture of navicular [scaphoid] of left foot, initial encounter for closed fracture: Secondary | ICD-10-CM

## 2023-03-18 NOTE — Telephone Encounter (Signed)
Placed an order for the knee scooter with [employer]. Alicia Coffey is aware that they will be in contact with her.

## 2023-04-21 ENCOUNTER — Other Ambulatory Visit: Payer: Self-pay

## 2023-04-21 DIAGNOSIS — R0609 Other forms of dyspnea: Secondary | ICD-10-CM

## 2023-04-22 ENCOUNTER — Ambulatory Visit (INDEPENDENT_AMBULATORY_CARE_PROVIDER_SITE_OTHER): Payer: Medicaid Other | Admitting: Pulmonary Disease

## 2023-04-22 ENCOUNTER — Encounter: Payer: Self-pay | Admitting: Pulmonary Disease

## 2023-04-22 VITALS — BP 132/64 | HR 77 | Temp 98.3°F | Ht 63.5 in | Wt 242.0 lb

## 2023-04-22 DIAGNOSIS — R109 Unspecified abdominal pain: Secondary | ICD-10-CM

## 2023-04-22 DIAGNOSIS — J849 Interstitial pulmonary disease, unspecified: Secondary | ICD-10-CM | POA: Diagnosis not present

## 2023-04-22 LAB — PULMONARY FUNCTION TEST
DL/VA % pred: 110 %
DL/VA: 4.76 ml/min/mmHg/L
DLCO cor % pred: 69 %
DLCO cor: 14.24 ml/min/mmHg
DLCO unc % pred: 69 %
DLCO unc: 14.24 ml/min/mmHg
FEF 25-75 Post: 2.77 L/sec
FEF 25-75 Pre: 2.57 L/sec
FEF2575-%Change-Post: 7 %
FEF2575-%Pred-Post: 106 %
FEF2575-%Pred-Pre: 98 %
FEV1-%Change-Post: 1 %
FEV1-%Pred-Post: 67 %
FEV1-%Pred-Pre: 66 %
FEV1-Post: 1.81 L
FEV1-Pre: 1.77 L
FEV1FVC-%Change-Post: 1 %
FEV1FVC-%Pred-Pre: 112 %
FEV6-%Change-Post: 0 %
FEV6-%Pred-Post: 60 %
FEV6-%Pred-Pre: 59 %
FEV6-Post: 2 L
FEV6-Pre: 1.99 L
FEV6FVC-%Pred-Post: 102 %
FEV6FVC-%Pred-Pre: 102 %
FVC-%Change-Post: 0 %
FVC-%Pred-Post: 58 %
FVC-%Pred-Pre: 58 %
FVC-Post: 2 L
FVC-Pre: 1.99 L
Post FEV1/FVC ratio: 90 %
Post FEV6/FVC ratio: 100 %
Pre FEV1/FVC ratio: 89 %
Pre FEV6/FVC Ratio: 100 %
RV % pred: 85 %
RV: 1.55 L
TLC % pred: 90 %
TLC: 4.5 L

## 2023-04-22 NOTE — Progress Notes (Signed)
Alicia Coffey    [id number]    12-18-1970  Primary Care Physician:Anderson, [name], FNP  Referring Physician: Diamantina Providence, FNP [id number] [place],  [place] [address]  Chief complaint: Consult for cystic lung disease  HPI: 52 y.o. 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  Noted incidentally on CT scan She has mild dyspnea on exertion.  Denies any cough, sputum production, fevers or chills.  She has no family history of lung issues or history of pneumothoraces.  Pets: No pets Occupation: Retired Lawyer Exposures: No exposures.  No mold, hot tub, Jacuzzi.  No feather pillows or comforters Smoking history: Never smoker Travel history: No significant travel history Relevant family history: No family history of lung disease  Outpatient Encounter Medications as of 04/22/2023  Medication Sig   acetaminophen (TYLENOL) 500 MG tablet Take 1,000 mg by mouth every 6 (six) hours as needed for mild pain or headache.   albuterol (PROVENTIL HFA;VENTOLIN HFA) 108 (90 Base) MCG/ACT inhaler Inhale 2 puffs into the lungs every 6 (six) hours as needed for wheezing or shortness of breath.   aspirin EC 81 MG tablet Take 81  mg by mouth at bedtime. Swallow whole.   bumetanide (BUMEX) 0.5 MG tablet Take 0.5 mg by mouth 2 (two) times daily.   ciclopirox (LOPROX) 0.77 % cream Apply 1 application topically 2 (two) times daily as needed (itching).   clobetasol ointment (TEMOVATE) 0.05 % Apply 1 application  topically 2 (two) times daily as needed.   Docusate Sodium (DSS) 100 MG CAPS Take 1 capsule by mouth 2 (two) times daily.   Dulaglutide 4.5 MG/0.5ML SOPN    DULoxetine (CYMBALTA) 60 MG capsule Take 1 capsule (60 mg total) by mouth daily. (Patient taking differently: Take 60 mg by mouth 2 (two) times daily.)   EASY COMFORT PEN NEEDLES 31G X 8 MM MISC    Empagliflozin-metFORMIN HCl ER (SYNJARDY XR) 25-1000 MG TB24 Take 1 tablet by mouth at bedtime.   Finerenone (KERENDIA) 10 MG TABS Take 10 mg by mouth as directed. On Monday, Wednesday, & Friday   hydrALAZINE (APRESOLINE) 25 MG tablet Take 25 mg by mouth 2 (two) times daily.   hydrocortisone cream 1 % Apply to affected area 2 times daily (Patient taking differently: as needed for itching. Apply to affected area 2 times daily)   hydrOXYzine (ATARAX/VISTARIL) 25 MG tablet Take 25 mg by mouth at bedtime.   insulin glargine (LANTUS) 100 UNIT/ML injection Inject 40 Units into the skin at bedtime.   irbesartan (AVAPRO) 150 MG tablet Take 1 tablet (150 mg total) by mouth daily. (Patient taking differently: Take 150 mg by mouth at bedtime.)   meloxicam (MOBIC) 15 MG tablet Take 15 mg by mouth daily as needed for pain.   methocarbamol (ROBAXIN) 500 MG tablet Take 500 mg by mouth 2 (two) times  daily.   metoprolol tartrate (LOPRESSOR) 25 MG tablet Take 25 mg by mouth 2 (two) times daily.   NOVOLOG FLEXPEN 100 UNIT/ML FlexPen Inject into the skin. Sliding scale   Polysaccharide Iron Complex (POLY-IRON 150 PO) Take 1 tablet by mouth 2 (two) times daily.   rosuvastatin (CRESTOR) 40 MG tablet Take 40 mg by mouth at bedtime.    traMADol (ULTRAM) 50 MG tablet Take 50 mg by mouth daily.    No facility-administered encounter medications on file as of 04/22/2023.    Allergies as of 04/22/2023 - Review Complete 04/22/2023  Allergen Reaction Noted   Penicillins Other (See Comments) 02/13/2014   Other Hives, Itching, and Rash 10/11/2012    Past Medical History:  Diagnosis Date   Anemia    Anxiety    Arthritis    Right knee   CKD (chronic kidney disease) stage 2, GFR 60-89 ml/min    Dr. 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   Morbid obesity with BMI of 40.0-44.9, adult (HCC)    Obstructive sleep apnea of adult 2017   Oropharyngeal dysphagia    Peripheral neuropathy    Proteinuria    Secondary hyperparathyroidism (HCC)    Shortness of breath on exertion    Type 2 diabetes mellitus (HCC)    Uterine fibroid    Vertigo 2015   Vitamin D deficiency    Wears dentures     Past Surgical History:  Procedure Laterality Date   ABLATION     uterine    BREAST BIOPSY Left 2014   benign   BREAST BIOPSY Right 2013   high risk   BREAST BIOPSY Left 2021   benign   BREAST BIOPSY Left 2021   benign   BREAST EXCISIONAL BIOPSY Left    BREAST LUMPECTOMY Left 2021   LCIS   BREAST LUMPECTOMY WITH NEEDLE LOCALIZATION  08/15/2012   Procedure: BREAST LUMPECTOMY WITH NEEDLE LOCALIZATION;  Surgeon: Currie Paris, MD;  Location: [facility];  Service: General;  Laterality: Right;  Wire localizations Right breast calcifications   BREAST LUMPECTOMY WITH RADIOACTIVE SEED LOCALIZATION Left 01/30/2020   Procedure: LEFT BREAST LUMPECTOMY X 2 WITH RADIOACTIVE SEED LOCALIZATION;  Surgeon: Abigail Miyamoto, MD;  Location: [facility];  Service: General;  Laterality: Left;    CARPAL TUNNEL RELEASE Right 11/24/2017   Procedure: RIGHT CARPAL TUNNEL RELEASE;  Surgeon: Coletta Memos, MD;  [facility]: [facility];  Service: Neurosurgery;  Laterality: Right;  Right CARPAL TUNNEL RELEASE   CARPAL TUNNEL RELEASE Left 08/31/2018   Procedure: LEFT CARPAL TUNNEL RELEASE;  Surgeon: Coletta Memos, MD;  Location: [facility];  Service: Neurosurgery;  Laterality: Left;   DILITATION & CURRETTAGE/HYSTROSCOPY WITH THERMACHOICE ABLATION N/A 08/21/2014   Procedure: DILATATION & CURETTAGE/HYSTEROSCOPY WITH THERMACHOICE ABLATION;  Surgeon: Tilda Burrow, MD;  Location: [facility];  Service: Gynecology;  Laterality: N/A;   KNEE ARTHROSCOPY Left 10/16/2015   Procedure: ARTHROSCOPY KNEE with debridment;  Surgeon: Gean Birchwood, MD;  Location: [facility];  Service: Orthopedics;  Laterality: Left;   KNEE ARTHROSCOPY WITH MEDIAL MENISECTOMY Right 07/17/2015   Procedure: KNEE ARTHROSCOPY WITH MEDIAL MENISECTOMY;  Surgeon:  Gean Birchwood, MD;  Location: [facility];  Service: Orthopedics;  Laterality: Right;   POLYPECTOMY N/A 08/21/2014   Procedure: ENDOMETRIAL POLYPECTOMY;  Surgeon: Tilda Burrow, MD;  Location: [facility];  Service: Gynecology;  Laterality: N/A;   REFRACTIVE SURGERY Right    micro aneuysms   TRIGGER FINGER RELEASE Left    ULNAR NERVE TRANSPOSITION Right 11/24/2017   Procedure: RIGHT ULNAR NERVE DECOMPRESSION/TRANSPOSITION;  Surgeon: Coletta Memos, MD;  [facility];  Service: Neurosurgery;  Laterality: Right;  Right ULNAR NERVE DECOMPRESSION/TRANSPOSITION    Family History  Problem Relation Age of Onset   Diabetes Mother    Heart disease Mother    Hypertension Mother    Diabetes Father    Heart disease Father    Hypertension Father    Heart attack Father    Diabetes Sister    Fibromyalgia Sister    Diabetes Sister    Diabetes Brother    Heart attack Maternal Grandmother        <35   Heart attack Maternal Grandfather    Breast cancer Paternal  Grandmother 82       breast; dbl mastectomy   Heart attack Paternal Grandfather    Colon polyps Neg Hx    Colon cancer Neg Hx    Crohn's disease Neg Hx    Esophageal cancer Neg Hx    Rectal cancer Neg Hx    Stomach cancer Neg Hx     Social History   Socioeconomic History   Marital status: Single    Spouse name: Not on file   Number of children: 0   Years of education: college   Highest education level: Not on file  Occupational History   Occupation: care giver    Employer: [employer]  Tobacco Use   Smoking status: Never   Smokeless tobacco: Never  Vaping Use   Vaping status: Never Used  Substance and Sexual Activity   Alcohol use: No   Drug use: No   Sexual activity: Not on file  Other Topics Concern   Not on file  Social History Narrative   Patient lives at home with her mother and she is single.   Patient works for [employer] .   Education college    Right handed   Caffeine none    Social Determinants of Health   Financial Resource Strain: Medium Risk (04/22/2021)   Overall Financial Resource Strain (CARDIA)    Difficulty of Paying Living Expenses: Somewhat hard  Food Insecurity: No Food Insecurity (04/22/2021)   Hunger Vital Sign    Worried About Running Out of Food in the Last Year: Never true    Ran Out of Food in the Last Year: Never true  Transportation Needs: No Transportation Needs (04/22/2021)   PRAPARE - Administrator, Civil Service (Medical): No    Lack of Transportation (Non-Medical): No  Physical Activity: Inactive (04/22/2021)   Exercise Vital Sign    Days of Exercise per Week: 0 days    Minutes of Exercise per Session: 0 min  Stress: Stress Concern Present (04/22/2021)   [employer] of Occupational Health - Occupational Stress Questionnaire    Feeling of Stress : To some extent  Social Connections: Moderately Integrated (04/22/2021)   Social Connection and Isolation Panel [NHANES]    Frequency of  Communication with Friends and Family: More than three times a week    Frequency of Social Gatherings with Friends and Family: Twice a week  Attends Religious Services: More than 4 times per year    Active Member of Clubs or Organizations: Yes    Attends Banker Meetings: More than 4 times per year    Marital Status: Never married  Intimate Partner Violence: Not At Risk (04/22/2021)   Humiliation, Afraid, Rape, and Kick questionnaire    Fear of Current or Ex-Partner: No    Emotionally Abused: No    Physically Abused: No    Sexually Abused: No    Review of systems: Review of Systems  Constitutional: Negative for fever and chills.  HENT: Negative.   Eyes: Negative for blurred vision.  Respiratory: as per HPI  Cardiovascular: Negative for chest pain and palpitations.  Gastrointestinal: Negative for vomiting, diarrhea, blood per rectum. Genitourinary: Negative for dysuria, urgency, frequency and hematuria.  Musculoskeletal: Negative for myalgias, back pain and joint pain.  Skin: Negative for itching and rash.  Neurological: Negative for dizziness, tremors, focal weakness, seizures and loss of consciousness.  Endo/Heme/Allergies: Negative for environmental allergies.  Psychiatric/Behavioral: Negative for depression, suicidal ideas and hallucinations.  All other systems reviewed and are negative.  Physical Exam: Blood pressure 132/64, pulse 77, temperature 98.3 F (36.8 C), temperature source Oral, height 5' 3.5" (1.613 m), weight 242 lb (109.8 kg), SpO2 98%. Gen:      No acute distress HEENT:  EOMI, sclera anicteric Neck:     No masses; no thyromegaly Lungs:    Clear to auscultation bilaterally; normal respiratory effort CV:         Regular rate and rhythm; no murmurs Abd:      + bowel sounds; soft, non-tender; no palpable masses, no distension Ext:    No edema; adequate peripheral perfusion Skin:      Warm and dry; no rash Neuro: alert and oriented x 3 Psych:  normal mood and affect  Data Reviewed: Imaging: CT chest 03/10/2023-multiple scattered thin-walled cysts with no underlying interstitial lung disease.  Minimal scarring of the medial right lower lobe.  I had reviewed the images personally.  PFTs: 04/22/2023 FVC 2.00 [58%], FEV1 1.81 [69%], F/F90, TLC 4.50 [90%], DLCO 14.24 [69%] Mild diffusion defect  Labs:  Assessment:  Cystic lung disease High-res CT shows multiple thin-walled cysts without any underlying interstitial changes.  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

## 2023-04-22 NOTE — Progress Notes (Signed)
Full PFT performed today. °

## 2023-04-22 NOTE — Patient Instructions (Signed)
Full PFT performed today. °

## 2023-04-22 NOTE — Patient Instructions (Signed)
Will get some labs today for further assessment of the cystic areas in the lungs Will get with VEGF-D, alpha-1 antitrypsin levels and phenotype, ANA, rheumatoid factor, SSA, SSB  Follow-up in 3 months.

## 2023-04-23 ENCOUNTER — Encounter: Payer: Self-pay | Admitting: Podiatry

## 2023-04-23 ENCOUNTER — Other Ambulatory Visit: Payer: Medicaid Other

## 2023-04-23 DIAGNOSIS — J849 Interstitial pulmonary disease, unspecified: Secondary | ICD-10-CM

## 2023-05-03 LAB — ALPHA-1 ANTITRYPSIN PHENOTYPE: A-1 Antitrypsin, Ser: 135 mg/dL (ref 83–199)

## 2023-05-03 LAB — RHEUMATOID FACTOR: Rheumatoid fact SerPl-aCnc: <10 IU/mL (ref <14)

## 2023-05-03 LAB — ANA: Anti Nuclear Antibody (ANA): NEGATIVE

## 2023-05-03 LAB — SJOGREN'S SYNDROME ANTIBODS(SSA + SSB)
SSA (Ro) (ENA) Antibody, IgG: <1 AI
SSB (La) (ENA) Antibody, IgG: <1 AI

## 2023-05-05 ENCOUNTER — Ambulatory Visit
Admission: RE | Admit: 2023-05-05 | Discharge: 2023-05-05 | Disposition: A | Discharge status: Home / Self Care | Payer: Medicaid Other | Source: Ambulatory Visit | Attending: Pulmonary Disease | Admitting: Pulmonary Disease

## 2023-05-05 DIAGNOSIS — R109 Unspecified abdominal pain: Secondary | ICD-10-CM

## 2023-05-05 MED ORDER — IOPAMIDOL (ISOVUE-300) INJECTION 61%
100.0000 mL | Freq: Once | INTRAVENOUS | Status: AC | PRN
  Administered 2023-05-05: 100 mL via INTRAVENOUS

## 2023-05-06 ENCOUNTER — Ambulatory Visit: Payer: Medicaid Other | Admitting: Podiatry

## 2023-05-12 ENCOUNTER — Telehealth: Payer: Self-pay | Admitting: Pulmonary Disease

## 2023-05-12 NOTE — Telephone Encounter (Signed)
Per Boyd Kerbs in the lab-   The specimen VGEF was sent to [facility].  Specimen on back order will be delayed 2 to 4 weeks.  Thank you

## 2023-05-18 ENCOUNTER — Encounter: Payer: Self-pay | Admitting: Podiatry

## 2023-05-18 ENCOUNTER — Ambulatory Visit (INDEPENDENT_AMBULATORY_CARE_PROVIDER_SITE_OTHER): Payer: Medicaid Other | Admitting: Podiatry

## 2023-05-18 DIAGNOSIS — M79609 Pain in unspecified limb: Secondary | ICD-10-CM

## 2023-05-18 DIAGNOSIS — B351 Tinea unguium: Secondary | ICD-10-CM | POA: Diagnosis not present

## 2023-05-18 DIAGNOSIS — E0843 Diabetes mellitus due to underlying condition with diabetic autonomic (poly)neuropathy: Secondary | ICD-10-CM

## 2023-05-21 ENCOUNTER — Ambulatory Visit: Payer: Medicaid Other | Admitting: Podiatry

## 2023-05-28 ENCOUNTER — Other Ambulatory Visit: Payer: Self-pay | Admitting: Adult Health

## 2023-05-28 DIAGNOSIS — N6320 Unspecified lump in the left breast, unspecified quadrant: Secondary | ICD-10-CM

## 2023-05-28 NOTE — Progress Notes (Signed)
  Subjective:  Patient ID: Alicia Coffey, female    DOB: 06-05-1971,  MRN: [id number]  Alicia Coffey presents to clinic today for: at risk foot care with history of diabetic neuropathy and painful elongated mycotic toenails 1-5 bilaterally which are tender when wearing enclosed shoe gear. Pain is relieved with periodic professional debridement. Patient states she is seeing Dr. Logan Bores for fracture of navicular left foot. She is trying to get her A1c down in order to have this surgically treated. Chief Complaint  Patient presents with   Nail Problem    DFC,Referring Provider Diamantina Providence, FNP,lov:04/24,A1C:7.5       PCP is Diamantina Providence, FNP.  Allergies  Allergen Reactions   Penicillins Other (See Comments)    PATIENT HAS HAD A PCN REACTION WITH IMMEDIATE RASH, FACIAL/TONGUE/THROAT SWELLING, SOB, OR LIGHTHEADEDNESS WITH HYPOTENSION:  #  #  YES  #  #  Has patient had a PCN reaction causing severe rash involving mucus membranes or skin necrosis: No Has patient had a PCN reaction that required hospitalization No Has patient had a PCN reaction occurring within the last 10 years: No    Latex    Other Hives, Itching and Rash    Powder in gloves Arthropod Insect    Review of Systems: Negative except as noted in the HPI.  Objective: No changes noted in today's physical examination. There were no vitals filed for this visit.  Alicia Coffey is a pleasant 52 y.o. female in NAD. AAO x 3.  Vascular Examination: Capillary refill time <3 seconds b/l LE. Palpable pedal pulses b/l LE. Digital hair present b/l. No pedal edema b/l. Skin temperature gradient WNL b/l. No varicosities b/l. No cyanosis or clubbing noted b/l LE.Marland Kitchen  Dermatological Examination: Pedal skin with normal turgor, texture and tone b/l. No open wounds. No interdigital macerations b/l. Toenails 3-5 b/l thickened, discolored, dystrophic with subungual debris. There is pain on palpation to dorsal aspect of nailplates.  Anonychia noted bilateral great toes and bilateral 2nd toes. Nailbed(s) epithelialized.   Neurological Examination: Pt has subjective symptoms of neuropathy. Protective sensation diminished with 10g monofilament b/l.  Musculoskeletal Examination: Muscle strength 5/5 to all lower extremity muscle groups bilaterally. Hammertoe(s) noted to the 3-5 bilaterally. 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  Assessment/Plan: 1. Pain due to onychomycosis of nail   2. Diabetes mellitus due to underlying condition with diabetic autonomic neuropathy, unspecified whether long term insulin use (HCC)     -Consent given for treatment as described below: -Examined patient. -Continue foot and shoe inspections daily. Monitor blood glucose per PCP/Endocrinologist's recommendations. -Patient to continue soft, supportive shoe gear daily. -Mycotic toenails 3-5 bilaterally were debrided in length and girth with sterile nail nippers and dremel without iatrogenic bleeding. -Patient/POA to call should there be question/concern in the interim.   Return in about 3 months (around 08/18/2023).  Freddie Breech, DPM

## 2023-06-21 ENCOUNTER — Ambulatory Visit: Payer: Medicaid Other | Admitting: Podiatry

## 2023-06-21 ENCOUNTER — Encounter: Payer: Self-pay | Admitting: Podiatry

## 2023-06-21 ENCOUNTER — Ambulatory Visit (INDEPENDENT_AMBULATORY_CARE_PROVIDER_SITE_OTHER): Payer: Medicaid Other

## 2023-06-21 DIAGNOSIS — S92252A Displaced fracture of navicular [scaphoid] of left foot, initial encounter for closed fracture: Secondary | ICD-10-CM

## 2023-06-21 DIAGNOSIS — S92252D Displaced fracture of navicular [scaphoid] of left foot, subsequent encounter for fracture with routine healing: Secondary | ICD-10-CM | POA: Diagnosis not present

## 2023-06-21 NOTE — Progress Notes (Signed)
Chief Complaint  Patient presents with   Foot Pain    "I think we're going to do xrays and discuss surgery."    HPI: 52 y.o. female presenting today for follow-up evaluation of navicular fracture to the left foot.  Last visit surgery was scheduled but the patient's A1c was elevated because she was not able to take her Trulicity since it was on backorder.  Since that time she has been taking her Trulicity and her A1c levels are down to 7.1. Patient is currently WBAT in a cam boot.  She says she has no pain to her foot.  She is able to ambulate just fine without pain with the assistance of a cane.  Presenting for further treatment and evaluation  Past Medical History:  Diagnosis Date   Anemia    Anxiety    Arthritis    Right knee   CKD (chronic kidney disease) stage 2, GFR 60-89 ml/min    Dr. 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   Morbid obesity with BMI of 40.0-44.9, adult (HCC)    Obstructive sleep apnea of adult 2017   Oropharyngeal dysphagia    Peripheral neuropathy    Proteinuria    Secondary hyperparathyroidism (HCC)    Shortness of breath on exertion    Type 2 diabetes mellitus (HCC)    Uterine fibroid    Vertigo 2015   Vitamin D deficiency    Wears dentures     Past Surgical History:  Procedure Laterality Date   ABLATION     uterine    BREAST BIOPSY Left 2014   benign   BREAST BIOPSY Right 2013   high risk   BREAST BIOPSY Left 2021   benign   BREAST BIOPSY Left 2021   benign   BREAST EXCISIONAL BIOPSY Left    BREAST LUMPECTOMY Left 2021   LCIS   BREAST LUMPECTOMY WITH NEEDLE LOCALIZATION  08/15/2012   Procedure: BREAST LUMPECTOMY WITH NEEDLE LOCALIZATION;  Surgeon:  Currie Paris, MD;  Location: [facility];  Service: General;  Laterality: Right;  Wire localizations Right breast calcifications   BREAST LUMPECTOMY WITH RADIOACTIVE SEED LOCALIZATION Left 01/30/2020   Procedure: LEFT BREAST LUMPECTOMY X 2 WITH RADIOACTIVE SEED LOCALIZATION;  Surgeon: Abigail Miyamoto, MD;  Location: [facility];  Service: General;  Laterality: Left;   CARPAL TUNNEL RELEASE Right 11/24/2017   Procedure: RIGHT CARPAL TUNNEL RELEASE;  Surgeon: Coletta Memos, MD;  Location: [facility];  Service: Neurosurgery;  Laterality: Right;  Right CARPAL TUNNEL RELEASE   CARPAL TUNNEL RELEASE Left 08/31/2018   Procedure: LEFT CARPAL TUNNEL RELEASE;  Surgeon: Coletta Memos, MD;  [facility]: [facility];  Service: Neurosurgery;  Laterality: Left;   DILITATION & CURRETTAGE/HYSTROSCOPY WITH THERMACHOICE ABLATION N/A 08/21/2014   Procedure: DILATATION & CURETTAGE/HYSTEROSCOPY WITH THERMACHOICE ABLATION;  Surgeon: Tilda Burrow, MD;  Location: [facility];  Service: Gynecology;  Laterality: N/A;   KNEE ARTHROSCOPY Left 10/16/2015   Procedure: ARTHROSCOPY KNEE with debridment;  Surgeon: Gean Birchwood, MD;  [facility];  Service: Orthopedics;  Laterality: Left;   KNEE ARTHROSCOPY WITH MEDIAL MENISECTOMY Right 07/17/2015  Procedure: KNEE ARTHROSCOPY WITH MEDIAL MENISECTOMY;  Surgeon: Gean Birchwood, MD;  Location: [facility];  Service: Orthopedics;  Laterality: Right;   POLYPECTOMY N/A 08/21/2014   Procedure: ENDOMETRIAL POLYPECTOMY;  Surgeon: Tilda Burrow, MD;  Location: [facility];  Service: Gynecology;  Laterality: N/A;   REFRACTIVE SURGERY Right    micro aneuysms   TRIGGER FINGER RELEASE Left    ULNAR NERVE TRANSPOSITION Right 11/24/2017   Procedure: RIGHT ULNAR NERVE DECOMPRESSION/TRANSPOSITION;  Surgeon: Coletta Memos, MD;  [facility];  Service: Neurosurgery;  Laterality: Right;  Right ULNAR NERVE DECOMPRESSION/TRANSPOSITION    Allergies   Allergen Reactions   Penicillins Other (See Comments)    PATIENT HAS HAD A PCN REACTION WITH IMMEDIATE RASH, FACIAL/TONGUE/THROAT SWELLING, SOB, OR LIGHTHEADEDNESS WITH HYPOTENSION:  #  #  YES  #  #  Has patient had a PCN reaction causing severe rash involving mucus membranes or skin necrosis: No Has patient had a PCN reaction that required hospitalization No Has patient had a PCN reaction occurring within the last 10 years: No    Latex    Other Hives, Itching and Rash    Powder in gloves Arthropod Insect     Physical Exam: General: The patient is alert and oriented x3 in no acute distress.  Dermatology: Skin is warm, dry and supple bilateral lower extremities. Negative for open lesions or macerations.  Vascular: Palpable pedal pulses bilaterally. Capillary refill within normal limits.  Edema resolved.  Neurological: Light touch and protective threshold diminished  Musculoskeletal Exam: No pedal deformities noted.  Today there is no pain or tenderness throughout the navicular.  Muscle strength 5/5 all compartments.  Patient is weightbearing without any pain  Radiographic Exam LT foot 12/28/2022:  Normal osseous mineralization.  Chronic displaced fracture noted bisecting the navicular.  Findings would be concerning for Charcot neuroarthropathy versus traumatic injury.  Evidence of pes planus deformity noted with collapse of the medial longitudinal arch of the foot.  Medial deviation of the talar head.  Radiographic exam LT foot 06/21/2023: Modest improvement.  There continues to be chronic displaced fracture bisecting the navicular but it appears very stable.  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   No acute fracture or dislocation.  Relative pes planus.   Moderate osteoarthritis of the talonavicular joint with a moderate talonavicular  joint effusion. Mild osteoarthritis of the navicular-medial cuneiform joint. Moderate osteoarthritis of the second TMT joint. Mild osteoarthritis of the third TMT joint and fourth TMT joint. Moderate osteoarthritis of the fifth TMT joint.   IMPRESSION: 1. 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 2. Moderate osteoarthritis of the talonavicular joint with a moderate talonavicular joint effusion. 3. Mild osteoarthritis of the navicular-medial cuneiform joint. 4. Moderate osteoarthritis of the second TMT joint. Mild osteoarthritis of the third TMT joint and fourth TMT joint. Moderate osteoarthritis of the fifth TMT joint.  Assessment: 1.  Navicular fracture left foot w/ osteolysis of the fracture site   Plan of Care:  -Patient evaluated.   -The patient has been WBAT in a cam boot for the last 3 months.  She no longer has any pain or tenderness associated to the foot.  She also attributes her neuropathy to the lack of pain as well. -For the moment I do not foresee any acute need for surgical intervention.  We will  pursue conservative treatment for now since she is essentially pain-free and ambulating just fine in a cam boot -Patient may transition out of the cam boot into good supportive tennis shoes with the assistance of a cane -Return to clinic as needed if she notices increased pain and tenderness associated to the navicular fracture  *Currently on disability      Felecia Shelling, DPM [facility]  Dr. Felecia Shelling, DPM    [address] [place], [place] [address]                Office [phone]  Fax [phone]

## 2023-07-05 ENCOUNTER — Ambulatory Visit: Payer: Medicaid Other

## 2023-07-06 ENCOUNTER — Other Ambulatory Visit: Payer: Self-pay | Admitting: Pulmonary Disease

## 2023-07-06 DIAGNOSIS — E278 Other specified disorders of adrenal gland: Secondary | ICD-10-CM

## 2023-07-13 ENCOUNTER — Other Ambulatory Visit: Payer: Medicaid Other

## 2023-07-14 ENCOUNTER — Inpatient Hospital Stay
Admission: RE | Admit: 2023-07-14 | Discharge: 2023-07-14 | Discharge status: Home / Self Care | Payer: Medicaid Other | Source: Ambulatory Visit | Attending: Adult Health | Admitting: Adult Health

## 2023-07-14 ENCOUNTER — Ambulatory Visit
Admission: RE | Admit: 2023-07-14 | Discharge: 2023-07-14 | Disposition: A | Discharge status: Home / Self Care | Payer: Medicaid Other | Source: Ambulatory Visit | Attending: Adult Health | Admitting: Adult Health

## 2023-07-14 ENCOUNTER — Other Ambulatory Visit: Payer: Self-pay | Admitting: Adult Health

## 2023-07-14 DIAGNOSIS — N6320 Unspecified lump in the left breast, unspecified quadrant: Secondary | ICD-10-CM

## 2023-07-14 DIAGNOSIS — N6310 Unspecified lump in the right breast, unspecified quadrant: Secondary | ICD-10-CM

## 2023-07-16 ENCOUNTER — Other Ambulatory Visit (HOSPITAL_COMMUNITY): Payer: Medicaid Other

## 2023-07-20 ENCOUNTER — Ambulatory Visit (HOSPITAL_COMMUNITY)
Admission: RE | Admit: 2023-07-20 | Discharge: 2023-07-20 | Disposition: A | Discharge status: Home / Self Care | Payer: Medicaid Other | Source: Ambulatory Visit | Attending: Pulmonary Disease | Admitting: Pulmonary Disease

## 2023-07-20 DIAGNOSIS — E278 Other specified disorders of adrenal gland: Secondary | ICD-10-CM | POA: Insufficient documentation

## 2023-07-21 ENCOUNTER — Ambulatory Visit: Payer: Medicaid Other | Admitting: Pulmonary Disease

## 2023-07-21 ENCOUNTER — Encounter: Payer: Self-pay | Admitting: Pulmonary Disease

## 2023-07-21 VITALS — BP 160/86 | HR 80 | Ht 63.5 in | Wt 247.4 lb

## 2023-07-21 DIAGNOSIS — J849 Interstitial pulmonary disease, unspecified: Secondary | ICD-10-CM

## 2023-07-21 DIAGNOSIS — E278 Other specified disorders of adrenal gland: Secondary | ICD-10-CM

## 2023-07-21 NOTE — Progress Notes (Signed)
Alicia Coffey    [id number]    10-Apr-1971  Primary Care Physician:Anderson, [name], FNP  Referring Physician: Diamantina Providence, FNP [address] [place],  [place] [address]  Chief complaint: Consult for cystic lung disease  HPI: 52 y.o. 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  Noted incidentally on CT scan She has mild dyspnea on exertion.  Denies any cough, sputum production, fevers or chills.  She has no family history of lung issues or history of pneumothoraces.  Pets: No pets Occupation: Retired Lawyer Exposures: No exposures.  No mold, hot tub, Jacuzzi.  No feather pillows or comforters Smoking history: Never smoker Travel history: No significant travel history Relevant family history: No family history of lung disease  Interim history Discussed the use of AI scribe software for clinical note transcription with the patient, who gave verbal consent to proceed.  The patient presents for follow-up of cystic lung disease identified on a CT scan. All labs, except for a pending VEGF-D, are negative. She is aware that she needs to monitor this condition going forward. The patient also reports that a recent CT  scan of the abdomen revealed a mass on the adrenal glands. She underwent an MRI, which was more difficult than usual due to breathing exercises. The results of the MRI are still pending. The patient also reports a history of LCIS and has recently discovered a lump in her breast. She is scheduled for a breast biopsy.   Outpatient Encounter Medications as of 07/21/2023  Medication Sig   acetaminophen (TYLENOL) 500 MG tablet Take 1,000 mg by mouth every 6 (six) hours as needed for mild pain or headache.   albuterol (PROVENTIL HFA;VENTOLIN HFA) 108 (90 Base) MCG/ACT inhaler Inhale 2 puffs into the lungs every 6 (six) hours as needed for wheezing or shortness of breath.   aspirin EC 81 MG tablet Take 81 mg by mouth at bedtime. Swallow whole.   bumetanide (BUMEX) 0.5 MG tablet Take 0.5 mg by mouth 2 (two) times daily.   ciclopirox (LOPROX) 0.77 % cream Apply 1 application topically 2 (two) times daily as needed (itching).   clobetasol ointment (TEMOVATE) 0.05 % Apply 1 application  topically 2 (two) times daily as needed.   Docusate Sodium (DSS) 100 MG CAPS Take 1 capsule by mouth 2 (two) times daily.   Dulaglutide 4.5 MG/0.5ML SOPN    DULoxetine (CYMBALTA) 60 MG capsule Take 1 capsule (60 mg total) by mouth daily. (Patient taking differently: Take 60 mg by mouth 2 (two) times daily.)   EASY COMFORT PEN NEEDLES 31G X 8 MM MISC    Empagliflozin-metFORMIN HCl ER (SYNJARDY XR) 25-1000 MG TB24 Take 1 tablet by mouth at bedtime.   Finerenone (KERENDIA) 10 MG TABS Take 10 mg by mouth as directed. On Monday, Wednesday, & Friday   hydrALAZINE (APRESOLINE) 25 MG  tablet Take 100 mg by mouth 2 (two) times daily.   hydrocortisone cream 1 % Apply to affected area 2 times daily (Patient taking differently: as needed for itching. Apply to affected area 2 times daily)   hydrOXYzine (ATARAX/VISTARIL) 25 MG tablet Take 25 mg by mouth at bedtime.   insulin glargine (LANTUS) 100 UNIT/ML injection Inject 40 Units into the  skin at bedtime.   irbesartan (AVAPRO) 150 MG tablet Take 1 tablet (150 mg total) by mouth daily. (Patient taking differently: Take 150 mg by mouth at bedtime.)   meloxicam (MOBIC) 15 MG tablet Take 15 mg by mouth daily as needed for pain.   methocarbamol (ROBAXIN) 500 MG tablet Take 500 mg by mouth 2 (two) times daily.   metoprolol tartrate (LOPRESSOR) 25 MG tablet Take 25 mg by mouth 2 (two) times daily.   NOVOLOG FLEXPEN 100 UNIT/ML FlexPen Inject into the skin. Sliding scale   Polysaccharide Iron Complex (POLY-IRON 150 PO) Take 1 tablet by mouth 2 (two) times daily.   rosuvastatin (CRESTOR) 40 MG tablet Take 40 mg by mouth at bedtime.    traMADol (ULTRAM) 50 MG tablet Take 50 mg by mouth daily.   No facility-administered encounter medications on file as of 07/21/2023.   Physical Exam: Blood pressure (!) 160/86, pulse 80, height 5' 3.5" (1.613 m), weight 247 lb 6.4 oz (112.2 kg), SpO2 98%. Gen:      No acute distress HEENT:  EOMI, sclera anicteric Neck:     No masses; no thyromegaly Lungs:    Clear to auscultation bilaterally; normal respiratory effort CV:         Regular rate and rhythm; no murmurs Abd:      + bowel sounds; soft, non-tender; no palpable masses, no distension Ext:    No edema; adequate peripheral perfusion Skin:      Warm and dry; no rash Neuro: alert and oriented x 3 Psych: normal mood and affect   Data Reviewed: Imaging: CT chest 03/10/2023-multiple scattered thin-walled cysts with no underlying interstitial lung disease.  Minimal scarring of the medial right lower lobe. CT abdomen pelvis 05/05/2023-5 cm nonspecific right adrenal mass, 2.8 cm benign appearing right adnexal cyst MRI abdomen 07/20/2023-radiology read is pending I reviewed the images personally  PFTs: 04/22/2023 FVC 2.00 [58%], FEV1 1.81 [69%], F/F90, TLC 4.50 [90%], DLCO 14.24 [69%] Mild diffusion defect  Labs: Labs 04/23/2023-SSA, SSB, rheumatoid factor, ANA-negative Alpha-1 antitrypsin levels  and phenotype- normal PIMM VEGF_D levels sent to [facility] are pending  Assessment:  Cystic lung disease High-res CT shows multiple thin-walled cysts without any underlying interstitial changes.  This may be bland, nonspecific or possibly lymphangiomyomatosis Labs negative for pathologic causes. Pending VEGF-D result. Likely benign, possibly genetic. No associated kidney issues on CT abdomen. -Plan for follow-up CT in 7 months. -Perform lung function test in 7 months.  Adrenal Mass Identified on CT abdomen. MRI performed but results pending. -Await MRI results. If abnormal, further steps will be discussed with the patient.  Breast Lump History of LCIS. New lump identified by patient. Biopsy scheduled. -[facility] with [facility] due to history and family history of breast cancer.  General Health Maintenance / Followup Plans -Return for follow-up in 7 months for CT and lung function test. -Await MRI results and discuss further steps if necessary. -[facility] care with [facility] for breast lump and LCIS history.   Plan/Recommendations: Await MRI results CT chest, PFTs in 7 months  Chilton Greathouse MD [facility] 07/21/2023, 11:05 AM  CC: Diamantina Providence, FNP

## 2023-07-21 NOTE — Patient Instructions (Signed)
VISIT SUMMARY:  During your visit, we discussed your cystic lung disease, the nodule on your adrenal gland, and the lump in your breast. All your lab results, except for the VEGF, are negative, which is a good sign. We are still waiting for the results of your MRI and the VEGF test. You are scheduled for a breast biopsy due to the lump you found in your breast.  YOUR PLAN:  -CYSTIC LUNG DISEASE: This is a condition where multiple cysts form in your lungs. Your labs are negative for any disease causes, which is good. We believe this could be a benign, possibly genetic condition. We will monitor this with a follow-up CT scan and a lung function test in 7 months.  -ADRENAL NODULE: This is a small lump in your adrenal gland that we found during your CT scan. We are waiting for the results of your MRI to understand it better. If the results are abnormal, we will discuss further steps.  -BREAST LUMP: You found a new lump in your breast. Given your history of LCIS, which is a condition that increases the risk of developing invasive breast cancer, we have scheduled a biopsy. You will continue your care with the [facility] due to your history and family history of breast cancer.  INSTRUCTIONS:  Please return for a follow-up in 7 months for a CT scan and a lung function test. We will also discuss the results of your MRI and any necessary next steps at that time. Continue your care with the [facility] for your breast lump and LCIS history.

## 2023-07-22 ENCOUNTER — Ambulatory Visit
Admission: RE | Admit: 2023-07-22 | Discharge: 2023-07-22 | Disposition: A | Discharge status: Home / Self Care | Payer: Medicaid Other | Source: Ambulatory Visit | Attending: Adult Health | Admitting: Adult Health

## 2023-07-22 DIAGNOSIS — N6310 Unspecified lump in the right breast, unspecified quadrant: Secondary | ICD-10-CM

## 2023-07-22 HISTORY — PX: BREAST BIOPSY: SHX20

## 2023-07-23 LAB — SURGICAL PATHOLOGY

## 2023-08-20 ENCOUNTER — Emergency Department (HOSPITAL_BASED_OUTPATIENT_CLINIC_OR_DEPARTMENT_OTHER)
Admission: EM | Admit: 2023-08-20 | Discharge: 2023-08-20 | Disposition: A | Discharge status: Home / Self Care | Payer: Medicaid Other

## 2023-08-20 ENCOUNTER — Emergency Department (HOSPITAL_BASED_OUTPATIENT_CLINIC_OR_DEPARTMENT_OTHER): Payer: Medicaid Other

## 2023-08-20 ENCOUNTER — Encounter (HOSPITAL_BASED_OUTPATIENT_CLINIC_OR_DEPARTMENT_OTHER): Payer: Self-pay | Admitting: Emergency Medicine

## 2023-08-20 ENCOUNTER — Other Ambulatory Visit: Payer: Self-pay

## 2023-08-20 DIAGNOSIS — Z7982 Long term (current) use of aspirin: Secondary | ICD-10-CM | POA: Diagnosis not present

## 2023-08-20 DIAGNOSIS — Z7901 Long term (current) use of anticoagulants: Secondary | ICD-10-CM | POA: Diagnosis not present

## 2023-08-20 DIAGNOSIS — Z79899 Other long term (current) drug therapy: Secondary | ICD-10-CM | POA: Diagnosis not present

## 2023-08-20 DIAGNOSIS — Z91040 Latex allergy status: Secondary | ICD-10-CM | POA: Insufficient documentation

## 2023-08-20 DIAGNOSIS — M7989 Other specified soft tissue disorders: Secondary | ICD-10-CM | POA: Diagnosis present

## 2023-08-20 DIAGNOSIS — E1122 Type 2 diabetes mellitus with diabetic chronic kidney disease: Secondary | ICD-10-CM | POA: Insufficient documentation

## 2023-08-20 DIAGNOSIS — Z853 Personal history of malignant neoplasm of breast: Secondary | ICD-10-CM | POA: Diagnosis not present

## 2023-08-20 DIAGNOSIS — I129 Hypertensive chronic kidney disease with stage 1 through stage 4 chronic kidney disease, or unspecified chronic kidney disease: Secondary | ICD-10-CM | POA: Diagnosis not present

## 2023-08-20 DIAGNOSIS — I82442 Acute embolism and thrombosis of left tibial vein: Secondary | ICD-10-CM | POA: Diagnosis not present

## 2023-08-20 DIAGNOSIS — Z794 Long term (current) use of insulin: Secondary | ICD-10-CM | POA: Diagnosis not present

## 2023-08-20 DIAGNOSIS — N182 Chronic kidney disease, stage 2 (mild): Secondary | ICD-10-CM | POA: Insufficient documentation

## 2023-08-20 MED ORDER — APIXABAN (ELIQUIS) VTE STARTER PACK (10MG AND 5MG)
ORAL_TABLET | ORAL | 0 refills | Status: DC
Start: 2023-08-20 — End: 2023-09-20

## 2023-08-20 MED ORDER — APIXABAN 2.5 MG PO TABS
10.0000 mg | ORAL_TABLET | Freq: Once | ORAL | Status: AC
  Administered 2023-08-20: 10 mg via ORAL
  Filled 2023-08-20: qty 4

## 2023-08-20 NOTE — ED Provider Notes (Signed)
[place] EMERGENCY DEPARTMENT AT [facility] Provider Note   CSN: [id number] Arrival date & time: 08/20/23  1507     History {Add pertinent medical, surgical, social history, OB history to HPI:1} Chief Complaint  Patient presents with   Leg Swelling    Alicia Coffey is a 52 y.o. female.  HPI   52 year old female presents to the emergency department complaints of left lower extremity swelling.  Patient states that she has noticed more prominent swelling over the past week to week and a half.  Reports history of prior fracture of her left foot followed by podiatry in the outpatient setting.  Denies any known trauma to the affected foot.  Does state that she has baseline neuropathy and was never aware of when she initially broke her foot in the first place and is concerned about additional damage to her left foot.  Denies any history of DVT/PE, recent surgery/immobilization, known malignancy, hormonal therapy, known coagulopathy.  Denies any chest pain, shortness of breath, fever.  Past medical history significant for breast cancer, CKD stage II, fibromyalgia, hyperlipidemia, hypertension, iron deficiency anemia, diabetes mellitus type 2, secondary hyperparathyroidism,  Home Medications Prior to Admission medications   Medication Sig Start Date End Date Taking? Authorizing Provider  acetaminophen (TYLENOL) 500 MG tablet Take 1,000 mg by mouth every 6 (six) hours as needed for mild pain or headache.    [provider]  albuterol (PROVENTIL HFA;VENTOLIN HFA) 108 (90 Base) MCG/ACT inhaler Inhale 2 puffs into the lungs every 6 (six) hours as needed for wheezing or shortness of breath.    [provider]  aspirin EC 81 MG tablet Take 81 mg by mouth at bedtime. Swallow whole.    [provider]  bumetanide (BUMEX) 0.5 MG tablet Take 0.5 mg by mouth 2 (two) times daily.    [provider]  ciclopirox (LOPROX) 0.77 % cream Apply 1 application  topically 2 (two) times daily as needed (itching).    [provider]  clobetasol ointment (TEMOVATE) 0.05 % Apply 1 application  topically 2 (two) times daily as needed. 04/22/20   [provider]  Docusate Sodium (DSS) 100 MG CAPS Take 1 capsule by mouth 2 (two) times daily.    [provider]  Dulaglutide 4.5 MG/0.5ML SOPN  05/04/22   [provider]  DULoxetine (CYMBALTA) 60 MG capsule Take 1 capsule (60 mg total) by mouth daily. Patient taking differently: Take 60 mg by mouth 2 (two) times daily. 12/14/13   Levert Feinstein, MD  EASY COMFORT PEN NEEDLES 31G X 8 MM MISC  08/13/20   [provider]  Empagliflozin-metFORMIN HCl ER (SYNJARDY XR) 25-1000 MG TB24 Take 1 tablet by mouth at bedtime.    [provider]  Finerenone (KERENDIA) 10 MG TABS Take 10 mg by mouth as directed. On Monday, Wednesday, & Friday 06/12/21   [provider]  hydrALAZINE (APRESOLINE) 25 MG tablet Take 100 mg by mouth 2 (two) times daily. 12/03/21   [provider]  hydrocortisone cream 1 % Apply to affected area 2 times daily Patient taking differently: as needed for itching. Apply to affected area 2 times daily 08/12/15   Cheri Fowler, PA-C  hydrOXYzine (ATARAX/VISTARIL) 25 MG tablet Take 25 mg by mouth at bedtime.    [provider]  insulin glargine (LANTUS) 100 UNIT/ML injection Inject 40 Units into the skin at bedtime.    [provider]  irbesartan (AVAPRO) 150 MG tablet Take 1 tablet (150 mg  total) by mouth daily. Patient taking differently: Take 150 mg by mouth at bedtime. 10/09/22   Nyoka Cowden, MD  meloxicam (MOBIC) 15 MG tablet Take 15 mg by mouth daily as needed for pain.    [provider]  methocarbamol (ROBAXIN) 500 MG tablet Take 500 mg by mouth 2 (two) times daily.    [provider]  metoprolol tartrate (LOPRESSOR) 25 MG tablet Take 25 mg by mouth 2 (two) times daily.    [provider]  NOVOLOG  FLEXPEN 100 UNIT/ML FlexPen Inject into the skin. Sliding scale 07/17/20   [provider]  Polysaccharide Iron Complex (POLY-IRON 150 PO) Take 1 tablet by mouth 2 (two) times daily.    [provider]  rosuvastatin (CRESTOR) 40 MG tablet Take 40 mg by mouth at bedtime.     [provider]  traMADol (ULTRAM) 50 MG tablet Take 50 mg by mouth daily.    [provider]      Allergies    Penicillins, Latex, and Other    Review of Systems   Review of Systems  All other systems reviewed and are negative.   Physical Exam Updated Vital Signs BP (!) 163/90 (BP Location: Right Arm)   Pulse (!) 103   Temp 97.9 F (36.6 C)   Resp 16   SpO2 97%  Physical Exam Vitals and nursing note reviewed.  Constitutional:      General: She is not in acute distress.    Appearance: She is well-developed.  HENT:     Head: Normocephalic and atraumatic.  Eyes:     Conjunctiva/sclera: Conjunctivae normal.  Cardiovascular:     Rate and Rhythm: Normal rate and regular rhythm.     Heart sounds: No murmur heard. Pulmonary:     Effort: Pulmonary effort is normal. No respiratory distress.     Breath sounds: Normal breath sounds.  Abdominal:     Palpations: Abdomen is soft.     Tenderness: There is no abdominal tenderness.  Musculoskeletal:        General: No swelling.     Cervical back: Neck supple.     Comments: 2-3+ pitting edema left lower extremity with 1+ on the right.  Full range of motion bilateral hips, knees, ankles, digits.  No obvious tenderness of left lower extremity.  Pedal pulses 2+ bilaterally.  No overlying erythema, induration, palpable fluctuance.  Skin:    General: Skin is warm and dry.     Capillary Refill: Capillary refill takes less than 2 seconds.  Neurological:     Mental Status: She is alert.  Psychiatric:        Mood and Affect: Mood normal.     ED Results / Procedures / Treatments   Labs (all labs ordered are listed, but only abnormal  results are displayed) Labs Reviewed - No data to display  EKG None  Radiology No results found.  Procedures Procedures  {Document cardiac monitor, telemetry assessment procedure when appropriate:1}  Medications Ordered in ED Medications - No data to display  ED Course/ Medical Decision Making/ A&P   {   Click here for ABCD2, HEART and other calculatorsREFRESH Note before signing :1}                              Medical Decision Making Amount and/or Complexity of Data Reviewed Radiology: ordered.   This patient presents to the ED for concern of leg swelling, this  involves an extensive number of treatment options, and is a complaint that carries with it a high risk of complications and morbidity.  The differential diagnosis includes DVT, cellulitis, fracture, dislocation, dependent edema, other   Co morbidities that complicate the patient evaluation  See HPI   Additional history obtained:  Additional history obtained from EMR External records from outside source obtained and reviewed including hospital records   Lab Tests:  N/a   Imaging Studies ordered:  I ordered imaging studies including lumbar x-ray, lower extremity ultrasound I independently visualized and interpreted imaging which showed  Left foot x-ray:*** Lower extremity ultrasound:*** I agree with the radiologist interpretation   Cardiac Monitoring: / EKG:  The patient was maintained on a cardiac monitor.  I personally viewed and interpreted the cardiac monitored which showed an underlying rhythm of: Sinus rhythm   Consultations Obtained:  N/a   Problem List / ED Course / Critical interventions / Medication management  Left lower extremity swelling Reevaluation of the patient showed that the patient stayed the same I have reviewed the patients home medicines and have made adjustments as needed   Social Determinants of Health:  Denies tobacco, licit drug use   Test / Admission -  Considered:  Left lower extremity swelling Vitals signs significant for hypertension blood pressure 163/90. Otherwise within normal range and stable throughout visit. Imaging studies significant for: See above *** Worrisome signs and symptoms were discussed with the patient, and the patient acknowledged understanding to return to the ED if noticed. Patient was stable upon discharge.    {Document critical care time when appropriate:1} {Document review of labs and clinical decision tools ie heart score, Chads2Vasc2 etc:1}  {Document your independent review of radiology images, and any outside records:1} {Document your discussion with family members, caretakers, and with consultants:1} {Document social determinants of health affecting pt's care:1} {Document your decision making why or why not admission, treatments were needed:1} Final Clinical Impression(s) / ED Diagnoses Final diagnoses:  None    Rx / DC Orders ED Discharge Orders     None

## 2023-08-20 NOTE — Discharge Instructions (Addendum)
As discussed, you did not break any bones in your foot but you do have a blood clot in your ankle.  I have sent a referral to hematology.  They will clinic several days to set up an appointment for further evaluation.  Get help right away if: You have chest pain or shortness of breath. You cough up blood. You have a rapid or irregular heartbeat. You feel light-headed or dizzy. You have new or increased pain, swelling, or redness in an arm or leg. You have numbness or tingling in an arm or leg. You are on blood thinning medicines and have a sudden severe headache or blood in your vomit, stool, or urine.

## 2023-08-20 NOTE — ED Notes (Signed)
Reviewed AVS with patient, patient expressed understanding of directions, denies further questions at this time. 

## 2023-08-20 NOTE — ED Triage Notes (Signed)
Left left swelling notice 1-2 week ago, increasing Some pain.denies known recent injury  Broken earlier in the year

## 2023-08-20 NOTE — ED Provider Notes (Signed)
  Physical Exam  BP (!) 149/84   Pulse 71   Temp 98 F (36.7 C) (Oral)   Resp 18   SpO2 96%   Physical Exam  Procedures  Procedures  ED Course / MDM    Medical Decision Making Amount and/or Complexity of Data Reviewed Radiology: ordered.   Patient care taken over at shift change.  She endorses left lower extremity swelling for the past week and a half.  Negative foot x-ray.   Posterior tibial vein DVT at the left ankle.  I informed patient of the results.  Discussed case with my attending Dr. Maple Hudson. First dose Eliquis given in the ED tonight and the prescription was sent to her pharmacy.  Internal referral for hematology sent.  Patient is stable and agreeable with plan.  All questions were answered.  Strict return precautions provided.    Maxwell Marion, PA-C 08/20/23 2317    Coral Spikes, DO 08/20/23 2318

## 2023-08-26 ENCOUNTER — Other Ambulatory Visit: Payer: Self-pay | Admitting: Internal Medicine

## 2023-09-15 ENCOUNTER — Encounter: Payer: Self-pay | Admitting: Podiatry

## 2023-09-15 ENCOUNTER — Ambulatory Visit (INDEPENDENT_AMBULATORY_CARE_PROVIDER_SITE_OTHER): Payer: Medicaid Other | Admitting: Podiatry

## 2023-09-15 VITALS — Ht 63.5 in | Wt 247.4 lb

## 2023-09-15 DIAGNOSIS — B351 Tinea unguium: Secondary | ICD-10-CM | POA: Diagnosis not present

## 2023-09-15 DIAGNOSIS — M79609 Pain in unspecified limb: Secondary | ICD-10-CM

## 2023-09-15 DIAGNOSIS — M79676 Pain in unspecified toe(s): Secondary | ICD-10-CM | POA: Diagnosis not present

## 2023-09-15 DIAGNOSIS — E0843 Diabetes mellitus due to underlying condition with diabetic autonomic (poly)neuropathy: Secondary | ICD-10-CM | POA: Diagnosis not present

## 2023-09-19 NOTE — Progress Notes (Signed)
Subjective:  Patient ID: Alicia Coffey, female    DOB: Dec 21, 1970,  MRN: [id number]  Alicia Coffey presents to clinic today for at risk foot care with history of diabetic neuropathy and painful, discolored, thick toenails which interfere with daily activities  Chief Complaint  Patient presents with   Nail Problem    Pt is here for [facility], last A1C was 8.1, PCP is Dr Alicia Coffey and LOV was 2 weeks ago.   Patient has known navicular fx left foot and is managed by Dr. Logan Coffey. Surgery to be planned with acceptable A1c.   PCP is Alicia Providence, FNP.  Allergies  Allergen Reactions   Penicillins Other (See Comments)    PATIENT HAS HAD A PCN REACTION WITH IMMEDIATE RASH, FACIAL/TONGUE/THROAT SWELLING, SOB, OR LIGHTHEADEDNESS WITH HYPOTENSION:  #  #  YES  #  #  Has patient had a PCN reaction causing severe rash involving mucus membranes or skin necrosis: No Has patient had a PCN reaction that required hospitalization No Has patient had a PCN reaction occurring within the last 10 years: No    Latex    Other Hives, Itching and Rash    Powder in gloves Arthropod Insect    Review of Systems: Negative except as noted in the HPI.  Objective: No changes noted in today's physical examination. There were no vitals filed for this visit. Alicia Coffey is a pleasant 52 y.o. female obese in NAD. AAO x 3.  Vascular Examination: Capillary refill time <3 seconds b/l LE. Palpable pedal pulses b/l LE. Digital hair present b/l. No pedal edema b/l. Skin temperature gradient WNL b/l. No varicosities b/l. No cyanosis or clubbing noted b/l LE.Marland Kitchen  Dermatological Examination: Pedal skin with normal turgor, texture and tone b/l. No open wounds. No interdigital macerations b/l. Toenails 3-5 b/l thickened, discolored, dystrophic with subungual debris. There is pain on palpation to dorsal aspect of nailplates. Anonychia noted bilateral great toes and bilateral 2nd toes. Nailbed(s) epithelialized.   Minimal  hyperkeratos(is/es) noted distal tip of left 2nd toe and distal tip of right 2nd toe.  Neurological Examination: Pt has subjective symptoms of neuropathy. Protective sensation diminished with 10g monofilament b/l.  Musculoskeletal Examination: Muscle strength 5/5 to all lower extremity muscle groups bilaterally. Hammertoe(s) noted to the 3-5 bilaterally.   Charcot deformity noted left foot. 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  Assessment/Plan:  1. Pain due to onychomycosis of nail   2. Diabetes mellitus due to underlying condition with diabetic autonomic neuropathy, unspecified whether long term insulin use (HCC)     -Consent given for treatment as described below: -Examined patient. -Patient under care of Dr. Logan Coffey for navicular fx left foot. Now okay to wear supportive shoe gear as surgery has been canceled. She is to contact Dr. Logan Coffey if she notices increased pain/tenderness of left foot. -Diabetic foot examination performed today. -Stressed the importance of good glycemic control and the detriment of not  controlling glucose levels in relation to the foot. -Continue supportive shoe gear daily. -Toenails were debrided in length and girth 3-5 bilaterally with sterile nail nippers and dremel without iatrogenic bleeding.  -As a courtesy, corn(s)  bilateral 2nd toes gently filed without complication or incident. Total number pared=2. -Patient/POA to call should there be question/concern in the interim.   Return in about 3 months (around 12/14/2023).  Alicia Coffey, DPM      [facility] LOCATION: [address]  [place], [place] [address]                   Office [phone]   [facility] LOCATION: [address] [place], [place] [address] Office [phone]

## 2023-09-20 ENCOUNTER — Telehealth: Payer: Self-pay | Admitting: Hematology and Oncology

## 2023-09-20 ENCOUNTER — Inpatient Hospital Stay: Payer: Medicaid Other | Attending: Hematology and Oncology | Admitting: Hematology and Oncology

## 2023-09-20 ENCOUNTER — Inpatient Hospital Stay: Payer: Medicaid Other

## 2023-09-20 VITALS — BP 140/67 | HR 90 | Temp 97.2°F | Resp 18 | Ht 63.5 in | Wt 247.1 lb

## 2023-09-20 DIAGNOSIS — I82402 Acute embolism and thrombosis of unspecified deep veins of left lower extremity: Secondary | ICD-10-CM

## 2023-09-20 DIAGNOSIS — Z7901 Long term (current) use of anticoagulants: Secondary | ICD-10-CM | POA: Diagnosis not present

## 2023-09-20 DIAGNOSIS — I82442 Acute embolism and thrombosis of left tibial vein: Secondary | ICD-10-CM

## 2023-09-20 MED ORDER — APIXABAN 5 MG PO TABS: 5.0000 mg | ORAL_TABLET | Freq: Two times a day (BID) | ORAL | Status: DC

## 2023-09-20 NOTE — Assessment & Plan Note (Signed)
08/20/2023: Left leg ultrasound: Acute DVT posterior tibial veins at the ankle level  Since this was an unprovoked blood clot, I discussed with the patient risk factors for blood clots.  Inherited risk factors include: 1. Factor V Leiden mutation 2. Prothrombin gene [id number] 3. Protein S deficiency  4. Protein C deficiency  5. Antithrombin deficiency  Acquired risk factors include: 1. Antiphospholipid antibody syndrome 2. Tobacco use 3. Obesity 4. Medications including oral contraceptives 5. Sedentary behavior including postoperative state 6. Foreign bodies in circulation  Workup recommended: Bloodwork to evaluate for the 5 inherited factors mentioned above along with antiphospholipid antibodies. Return to clinic in one to 2 weeks to discuss the results of the tests

## 2023-09-20 NOTE — Telephone Encounter (Signed)
Spoke with patient confirming upcoming appointment  

## 2023-09-20 NOTE — Progress Notes (Signed)
[facility] CONSULT NOTE  Patient Care Team: Diamantina Providence, FNP as PCP - General (Nurse Practitioner) Jonelle Sidle, MD as PCP - Cardiology (Cardiology)  CHIEF COMPLAINTS/PURPOSE OF CONSULTATION:  New onset DVT  HISTORY OF PRESENTING ILLNESS:   History of Present Illness   Patient has been referred to [place] for new onset of DVT and swelling of the left leg.  The patient reports a broken foot discovered in March, which was managed conservatively with a boot due to high A1c levels. The patient noticed swelling in the foot in November, which led to an ER visit and the discovery of the blood clot. The patient denies any previous history of blood clots. The patient is currently on Eliquis 5mg  twice a day for the blood clot. The patient also reports a family history of blood clots, with a father on heparin and a sister who had a blood clot.     We have previously seen her for history of atypical lobular hyperplasia for which she took 5 years of tamoxifen therapy.  While she was on tamoxifen she never had any blood clots.   I reviewed her records extensively and collaborated the history with the patient.  MEDICAL HISTORY:  Past Medical History:  Diagnosis Date   Anemia    Anxiety    Arthritis    Right knee   CKD (chronic kidney disease) stage 2, GFR 60-89 ml/min    Dr. 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   Morbid obesity with BMI of 40.0-44.9, adult (HCC)    Obstructive sleep apnea of adult 2017   Oropharyngeal dysphagia    Peripheral neuropathy    Proteinuria    Secondary hyperparathyroidism (HCC)    Shortness of breath on exertion    Type 2 diabetes mellitus (HCC)     Uterine fibroid    Vertigo 2015   Vitamin D deficiency    Wears dentures     SURGICAL HISTORY: Past Surgical History:  Procedure Laterality Date   ABLATION     uterine    BREAST BIOPSY Left 2014   benign   BREAST BIOPSY Right 2013   high risk   BREAST BIOPSY Left 2021   benign   BREAST BIOPSY Left 2021   benign   BREAST BIOPSY Right 07/22/2023   [place] RT BREAST BX W LOC DEV 1ST LESION IMG BX SPEC US GUIDE 07/22/2023 [facility]   BREAST EXCISIONAL BIOPSY Left    BREAST LUMPECTOMY Left 2021   LCIS   BREAST LUMPECTOMY WITH NEEDLE LOCALIZATION  08/15/2012   Procedure: BREAST LUMPECTOMY WITH NEEDLE LOCALIZATION;  Surgeon: Currie Paris, MD;  Location: [facility];  Service: General;  Laterality: Right;  Wire localizations Right breast calcifications   BREAST LUMPECTOMY WITH RADIOACTIVE SEED LOCALIZATION Left 01/30/2020   Procedure: LEFT BREAST LUMPECTOMY X 2 WITH RADIOACTIVE SEED LOCALIZATION;  Surgeon: Abigail Miyamoto, MD;  Location: [facility];  Service: General;  Laterality: Left;   CARPAL TUNNEL RELEASE Right 11/24/2017   Procedure: RIGHT CARPAL TUNNEL RELEASE;  Surgeon: Coletta Memos, MD;  Location: [facility];  Service: Neurosurgery;  Laterality: Right;  Right CARPAL TUNNEL RELEASE   CARPAL TUNNEL RELEASE Left 08/31/2018   Procedure: LEFT CARPAL TUNNEL RELEASE;  Surgeon: Coletta Memos, MD;  Location: [facility];  Service: Neurosurgery;  Laterality: Left;   DILITATION & CURRETTAGE/HYSTROSCOPY WITH THERMACHOICE ABLATION N/A 08/21/2014   Procedure: DILATATION & CURETTAGE/HYSTEROSCOPY WITH THERMACHOICE ABLATION;  Surgeon: Tilda Burrow, MD;  Location: [facility];  Service: Gynecology;  Laterality: N/A;   KNEE ARTHROSCOPY Left 10/16/2015   Procedure: ARTHROSCOPY KNEE with debridment;  Surgeon: Gean Birchwood, MD;  Location: [facility];  Service: Orthopedics;  Laterality: Left;   KNEE ARTHROSCOPY WITH MEDIAL MENISECTOMY Right 07/17/2015    Procedure: KNEE ARTHROSCOPY WITH MEDIAL MENISECTOMY;  Surgeon: Gean Birchwood, MD;  Location: [facility];  Service: Orthopedics;  Laterality: Right;   POLYPECTOMY N/A 08/21/2014   Procedure: ENDOMETRIAL POLYPECTOMY;  Surgeon: Tilda Burrow, MD;  Location: [facility];  Service: Gynecology;  Laterality: N/A;   REFRACTIVE SURGERY Right    micro aneuysms   TRIGGER FINGER RELEASE Left    ULNAR NERVE TRANSPOSITION Right 11/24/2017   Procedure: RIGHT ULNAR NERVE DECOMPRESSION/TRANSPOSITION;  Surgeon: Coletta Memos, MD;  Location: [facility];  Service: Neurosurgery;  Laterality: Right;  Right ULNAR NERVE DECOMPRESSION/TRANSPOSITION    SOCIAL HISTORY: Social History   Socioeconomic History   Marital status: Single    Spouse name: Not on file   Number of children: 0   Years of education: college   Highest education level: Not on file  Occupational History   Occupation: care giver    Employer: [employer]  Tobacco Use   Smoking status: Never   Smokeless tobacco: Never  Vaping Use   Vaping status: Never Used  Substance and Sexual Activity   Alcohol use: No   Drug use: No   Sexual activity: Not on file  Other Topics Concern   Not on file  Social History Narrative   Patient lives at home with her mother and she is single.   Patient works for [employer] .   Education college    Right handed   Caffeine none    Social Determinants of Health   Financial Resource Strain: Medium Risk (04/22/2021)   Overall Financial Resource Strain (CARDIA)    Difficulty of Paying Living Expenses: Somewhat hard  Food Insecurity: No Food Insecurity (04/22/2021)   Hunger Vital Sign    Worried About Running Out of Food in the Last Year: Never true    Ran Out of Food in the Last Year: Never true  Transportation Needs: No Transportation Needs (04/22/2021)   PRAPARE - Administrator, Civil Service (Medical): No    Lack of Transportation (Non-Medical): No  Physical  Activity: Inactive (04/22/2021)   Exercise Vital Sign    Days of Exercise per Week: 0 days    Minutes of Exercise per Session: 0 min  Stress: Stress Concern Present (04/22/2021)   [employer] of Occupational Health - Occupational Stress Questionnaire    Feeling of Stress : To some extent  Social Connections: Moderately Integrated (04/22/2021)   Social Connection and Isolation Panel [NHANES]    Frequency of Communication with Friends and Family: More than three times a week    Frequency of Social Gatherings with Friends and Family: Twice a week    Attends Religious Services: More than 4 times per year    Active Member of [employer] or Organizations: Yes    Attends Banker Meetings: More than 4 times per year  Marital Status: Never married  Intimate Partner Violence: Not At Risk (04/22/2021)   Humiliation, Afraid, Rape, and Kick questionnaire    Fear of Current or Ex-Partner: No    Emotionally Abused: No    Physically Abused: No    Sexually Abused: No    FAMILY HISTORY: Family History  Problem Relation Age of Onset   Diabetes Mother    Heart disease Mother    Hypertension Mother    Diabetes Father    Heart disease Father    Hypertension Father    Heart attack Father    Diabetes Sister    Fibromyalgia Sister    Diabetes Sister    Diabetes Brother    Heart attack Maternal Grandmother        <35   Heart attack Maternal Grandfather    Breast cancer Paternal Grandmother 52       breast; dbl mastectomy   Heart attack Paternal Grandfather    Colon polyps Neg Hx    Colon cancer Neg Hx    Crohn's disease Neg Hx    Esophageal cancer Neg Hx    Rectal cancer Neg Hx    Stomach cancer Neg Hx     ALLERGIES:  is allergic to penicillins, latex, and other.  MEDICATIONS:  Current Outpatient Medications  Medication Sig Dispense Refill   apixaban (ELIQUIS) 5 MG TABS tablet Take 1 tablet (5 mg total) by mouth 2 (two) times daily.     acetaminophen (TYLENOL) 500 MG tablet  Take 1,000 mg by mouth every 6 (six) hours as needed for mild pain or headache.     albuterol (PROVENTIL HFA;VENTOLIN HFA) 108 (90 Base) MCG/ACT inhaler Inhale 2 puffs into the lungs every 6 (six) hours as needed for wheezing or shortness of breath.     aspirin EC 81 MG tablet Take 81 mg by mouth at bedtime. Swallow whole.     bumetanide (BUMEX) 0.5 MG tablet Take 0.5 mg by mouth 2 (two) times daily.     ciclopirox (LOPROX) 0.77 % cream Apply 1 application topically 2 (two) times daily as needed (itching).     clobetasol ointment (TEMOVATE) 0.05 % Apply 1 application  topically 2 (two) times daily as needed.     Docusate Sodium (DSS) 100 MG CAPS Take 1 capsule by mouth 2 (two) times daily.     Dulaglutide 4.5 MG/0.5ML SOPN      DULoxetine (CYMBALTA) 60 MG capsule Take 1 capsule (60 mg total) by mouth daily. (Patient taking differently: Take 60 mg by mouth 2 (two) times daily.) 30 capsule 12   EASY COMFORT PEN NEEDLES 31G X 8 MM MISC      Empagliflozin-metFORMIN HCl ER (SYNJARDY XR) 25-1000 MG TB24 Take 1 tablet by mouth at bedtime.     Finerenone (KERENDIA) 10 MG TABS Take 10 mg by mouth as directed. On Monday, Wednesday, & Friday     hydrALAZINE (APRESOLINE) 25 MG tablet Take 100 mg by mouth 2 (two) times daily.     hydrocortisone cream 1 % Apply to affected area 2 times daily (Patient taking differently: as needed for itching. Apply to affected area 2 times daily) 15 g 0   hydrOXYzine (ATARAX/VISTARIL) 25 MG tablet Take 25 mg by mouth at bedtime.     insulin glargine (LANTUS) 100 UNIT/ML injection Inject 40 Units into the skin at bedtime.     irbesartan (AVAPRO) 150 MG tablet TAKE 1 TABLET BY MOUTH EVERY DAY 30 tablet 11   meloxicam (MOBIC) 15 MG  tablet Take 15 mg by mouth daily as needed for pain.     methocarbamol (ROBAXIN) 500 MG tablet Take 500 mg by mouth 2 (two) times daily.     metoprolol tartrate (LOPRESSOR) 25 MG tablet Take 25 mg by mouth 2 (two) times daily.     NOVOLOG FLEXPEN 100  UNIT/ML FlexPen Inject into the skin. Sliding scale     Polysaccharide Iron Complex (POLY-IRON 150 PO) Take 1 tablet by mouth 2 (two) times daily.     rosuvastatin (CRESTOR) 40 MG tablet Take 40 mg by mouth at bedtime.      traMADol (ULTRAM) 50 MG tablet Take 50 mg by mouth daily.     No current facility-administered medications for this visit.    REVIEW OF SYSTEMS:   Constitutional: Denies fevers, chills or abnormal night sweats Complains of left leg swelling All other systems were reviewed with the patient and are negative.  PHYSICAL EXAMINATION: ECOG PERFORMANCE STATUS: 1 - Symptomatic but completely ambulatory  Vitals:   09/20/23 1308  BP: (!) 140/67  Pulse: 90  Resp: 18  Temp: (!) 97.2 F (36.2 C)  SpO2: 96%   Filed Weights   09/20/23 1308  Weight: 247 lb 1.6 oz (112.1 kg)    GENERAL:alert, no distress and comfortable Markedly swollen left leg but she thinks it is getting better.  LABORATORY DATA:  I have reviewed the data as listed Lab Results  Component Value Date   WBC 12.1 (H) 08/23/2018   HGB 11.8 (L) 08/23/2018   HCT 36.9 08/23/2018   MCV 89.3 08/23/2018   PLT 185 08/23/2018   Lab Results  Component Value Date   NA 144 10/22/2022   K 5.3 (H) 10/22/2022   CL 106 10/22/2022   CO2 24 10/22/2022    RADIOGRAPHIC STUDIES: I have personally reviewed the radiological reports and agreed with the findings in the report.  ASSESSMENT AND PLAN:  Leg DVT (deep venous thromboembolism), acute, left (HCC) 08/20/2023: Left leg ultrasound: Acute DVT posterior tibial veins at the ankle level  Since this was an unprovoked blood clot, I discussed with the patient risk factors for blood clots.  Inherited risk factors include: 1. Factor V Leiden mutation 2. Prothrombin gene [id number] 3. Protein S deficiency  4. Protein C deficiency  5. Antithrombin deficiency  Acquired risk factors include: 1. Antiphospholipid antibody syndrome 2. Tobacco use 3. Obesity 4.  Medications including oral contraceptives 5. Sedentary behavior including postoperative state 6. Foreign bodies in circulation  Risk factors of the patient: Being in a boot for a long time as well as obesity.  Also family history.  Workup recommended: Bloodwork to evaluate for the 5 inherited factors mentioned above along with antiphospholipid antibodies. Return to clinic in one to 2 weeks to discuss the results of the tests  Assessment and Plan    Deep Vein Thrombosis (DVT) Recent DVT in the setting of immobilization due to a foot fracture. Currently on Eliquis 5mg  twice daily. No prior history of DVT. Discussed the potential causes and duration of anticoagulation therapy. -Continue Eliquis 5mg  twice daily. -Order labs to check for inherited and acquired thrombophilia. -Reassess in 3 months with a repeat ultrasound. If DVT is resolved and leg swelling is gone, consider stopping anticoagulation. If not, continue anticoagulation for a total of 6 months.  Foot Fracture Recent foot fracture managed conservatively with a boot. No current pain or discomfort. -Continue conservative management and regular follow-up with orthopedics.  Atypical Lobular Hyperplasia History of atypical lobular hyperplasia  treated with 5 years of tamoxifen. Recent negative biopsy for a new lump. -Continue regular follow-up and breast cancer screening as per guidelines.        All questions were answered. The patient knows to call the clinic with any problems, questions or concerns.    Alicia Meek, MD 09/20/23

## 2023-09-21 ENCOUNTER — Other Ambulatory Visit (HOSPITAL_COMMUNITY)
Admission: RE | Admit: 2023-09-21 | Discharge: 2023-09-21 | Disposition: A | Discharge status: Home / Self Care | Payer: Medicaid Other | Source: Ambulatory Visit | Attending: Nephrology | Admitting: Nephrology

## 2023-09-21 DIAGNOSIS — D631 Anemia in chronic kidney disease: Secondary | ICD-10-CM | POA: Diagnosis present

## 2023-09-21 DIAGNOSIS — N189 Chronic kidney disease, unspecified: Secondary | ICD-10-CM | POA: Insufficient documentation

## 2023-09-21 DIAGNOSIS — E211 Secondary hyperparathyroidism, not elsewhere classified: Secondary | ICD-10-CM | POA: Diagnosis not present

## 2023-09-21 DIAGNOSIS — R809 Proteinuria, unspecified: Secondary | ICD-10-CM | POA: Diagnosis present

## 2023-09-21 LAB — PROTEIN / CREATININE RATIO, URINE
Creatinine, Urine: 46 mg/dL
Protein Creatinine Ratio: 4.2 mg/mg{creat} — ABNORMAL HIGH (ref 0.00–0.15)
Total Protein, Urine: 193 mg/dL

## 2023-09-21 LAB — CBC
HCT: 36.7 % (ref 36.0–46.0)
Hemoglobin: 11.3 g/dL — ABNORMAL LOW (ref 12.0–15.0)
MCH: 26.8 pg (ref 26.0–34.0)
MCHC: 30.8 g/dL (ref 30.0–36.0)
MCV: 87.2 fL (ref 80.0–100.0)
Platelets: 294 10*3/uL (ref 150–400)
RBC: 4.21 MIL/uL (ref 3.87–5.11)
RDW: 15.5 % (ref 11.5–15.5)
WBC: 10.4 10*3/uL (ref 4.0–10.5)
nRBC: 0 % (ref 0.0–0.2)

## 2023-09-21 LAB — RENAL FUNCTION PANEL
Albumin: 3.2 g/dL — ABNORMAL LOW (ref 3.5–5.0)
Anion gap: 9 (ref 5–15)
BUN: 22 mg/dL — ABNORMAL HIGH (ref 6–20)
CO2: 27 mmol/L (ref 22–32)
Calcium: 9 mg/dL (ref 8.9–10.3)
Chloride: 103 mmol/L (ref 98–111)
Creatinine, Ser: 1.08 mg/dL — ABNORMAL HIGH (ref 0.44–1.00)
GFR, Estimated: >60 mL/min (ref >60)
Glucose, Bld: 174 mg/dL — ABNORMAL HIGH (ref 70–99)
Phosphorus: 4.3 mg/dL (ref 2.5–4.6)
Potassium: 4.3 mmol/L (ref 3.5–5.1)
Sodium: 139 mmol/L (ref 135–145)

## 2023-09-21 LAB — PROTEIN S, TOTAL: Protein S Ag, Total: 132 % (ref 60–150)

## 2023-09-21 LAB — ANTITHROMBIN III ANTIGEN: AT III AG PPP IMM-ACNC: 110 % (ref 72–124)

## 2023-09-22 LAB — PARATHYROID HORMONE, INTACT (NO CA): PTH: 33 pg/mL (ref 15–65)

## 2023-09-22 LAB — LUPUS ANTICOAGULANT PANEL
DRVVT: 41.6 s (ref 0.0–47.0)
PTT Lupus Anticoagulant: 31.4 s (ref 0.0–43.5)

## 2023-09-23 LAB — PROTEIN C, TOTAL: Protein C, Total: 128 % (ref 60–150)

## 2023-09-24 LAB — FACTOR 5 LEIDEN

## 2023-09-24 LAB — PROTHROMBIN GENE MUTATION

## 2023-09-30 ENCOUNTER — Telehealth: Payer: Medicaid Other | Admitting: Hematology and Oncology

## 2023-10-08 ENCOUNTER — Inpatient Hospital Stay (HOSPITAL_BASED_OUTPATIENT_CLINIC_OR_DEPARTMENT_OTHER): Payer: Medicaid Other | Admitting: Hematology and Oncology

## 2023-10-08 DIAGNOSIS — I82402 Acute embolism and thrombosis of unspecified deep veins of left lower extremity: Secondary | ICD-10-CM | POA: Diagnosis not present

## 2023-10-08 MED ORDER — APIXABAN 5 MG PO TABS: 5.0000 mg | ORAL_TABLET | Freq: Two times a day (BID) | ORAL | 4 refills | Status: AC

## 2023-10-08 NOTE — Progress Notes (Signed)
HEMATOLOGY-ONCOLOGY TELEPHONE VISIT PROGRESS NOTE  I connected with our patient on 10/08/23 at  9:30 AM EST by telephone and verified that I am speaking with the correct person using two identifiers.  I discussed the limitations, risks, security and privacy concerns of performing an evaluation and management service by telephone and the availability of in person appointments.  I also discussed with the patient that there may be a patient responsible charge related to this service. The patient expressed understanding and agreed to proceed.   History of Present Illness:    History of Present Illness   The patient, with a history of a left foot fracture and a subsequent blood clot, continues to experience leg swelling. The swelling varies day to day and is mitigated by elevation. She reports no new fractures on recent imaging. The patient is currently on blood thinners, with the duration of therapy yet to be determined.        REVIEW OF SYSTEMS:   Constitutional: Denies fevers, chills or abnormal weight loss Continued problems with left leg swelling if she does not elevate the legs. All other systems were reviewed with the patient and are negative. Observations/Objective:     Assessment Plan:  Leg DVT (deep venous thromboembolism), acute, left (HCC) 08/20/2023: Left leg ultrasound: Acute DVT posterior tibial veins at the ankle level  09/20/2023: Hypercoagulability workup: No lupus anticoagulant, Antithrombin III: 110%, protein S: 132%, protein C: 128%, factor V Leiden: Not detected prothrombin gene mutation: Not detected  Risk factors of the patient: Being in a boot from fracture for a long time as well as obesity.  Also family history   Duration of anticoagulation: 6 months. Because the patient still has swelling of the leg I recommended that we continue with anticoagulation for full 6 months. We will recheck with another ultrasound and make a decision about stopping anticoagulation in May  2025.      I discussed the assessment and treatment plan with the patient. The patient was provided an opportunity to ask questions and all were answered. The patient agreed with the plan and demonstrated an understanding of the instructions. The patient was advised to call back or seek an in-person evaluation if the symptoms worsen or if the condition fails to improve as anticipated.   I provided 12 minutes of non-face-to-face time during this encounter.  This includes time for charting and coordination of care   Tamsen Meek, MD

## 2023-10-08 NOTE — Assessment & Plan Note (Signed)
08/20/2023: Left leg ultrasound: Acute DVT posterior tibial veins at the ankle level  09/20/2023: Hypercoagulability workup: No lupus anticoagulant, Antithrombin III: 110%, protein S: 132%, protein C: 128%, factor V Leiden: Not detected prothrombin gene mutation: Not detected  Risk factors of the patient: Being in a boot from fracture for a long time as well as obesity.  Also family history   Duration of anticoagulation: 3 to 6 months. We will check for ultrasound around February 10 and then make a decision.

## 2023-10-11 ENCOUNTER — Telehealth: Payer: Self-pay | Admitting: Hematology and Oncology

## 2023-10-11 NOTE — Telephone Encounter (Signed)
Spoke with patient confirming upcoming appointment  

## 2023-10-21 ENCOUNTER — Ambulatory Visit
Admission: RE | Admit: 2023-10-21 | Discharge: 2023-10-21 | Disposition: A | Discharge status: Home / Self Care | Payer: Medicaid Other | Source: Ambulatory Visit | Attending: Pulmonary Disease | Admitting: Pulmonary Disease

## 2023-10-21 DIAGNOSIS — J849 Interstitial pulmonary disease, unspecified: Secondary | ICD-10-CM

## 2023-12-29 ENCOUNTER — Other Ambulatory Visit: Payer: Self-pay

## 2023-12-29 ENCOUNTER — Emergency Department (HOSPITAL_COMMUNITY)

## 2023-12-29 ENCOUNTER — Emergency Department (HOSPITAL_COMMUNITY)
Admission: EM | Admit: 2023-12-29 | Discharge: 2023-12-29 | Disposition: A | Discharge status: Home / Self Care | Attending: Emergency Medicine | Admitting: Emergency Medicine

## 2023-12-29 ENCOUNTER — Encounter (HOSPITAL_COMMUNITY): Payer: Self-pay

## 2023-12-29 DIAGNOSIS — Z7982 Long term (current) use of aspirin: Secondary | ICD-10-CM | POA: Diagnosis not present

## 2023-12-29 DIAGNOSIS — M7989 Other specified soft tissue disorders: Secondary | ICD-10-CM | POA: Diagnosis present

## 2023-12-29 DIAGNOSIS — Z7984 Long term (current) use of oral hypoglycemic drugs: Secondary | ICD-10-CM | POA: Diagnosis not present

## 2023-12-29 DIAGNOSIS — Z91040 Latex allergy status: Secondary | ICD-10-CM | POA: Insufficient documentation

## 2023-12-29 DIAGNOSIS — R600 Localized edema: Secondary | ICD-10-CM | POA: Diagnosis not present

## 2023-12-29 DIAGNOSIS — Z794 Long term (current) use of insulin: Secondary | ICD-10-CM | POA: Insufficient documentation

## 2023-12-29 DIAGNOSIS — Z7901 Long term (current) use of anticoagulants: Secondary | ICD-10-CM | POA: Insufficient documentation

## 2023-12-29 DIAGNOSIS — E11610 Type 2 diabetes mellitus with diabetic neuropathic arthropathy: Secondary | ICD-10-CM | POA: Insufficient documentation

## 2023-12-29 LAB — CBC WITH DIFFERENTIAL/PLATELET
Abs Immature Granulocytes: 0.02 10*3/uL (ref 0.00–0.07)
Basophils Absolute: 0 10*3/uL (ref 0.0–0.1)
Basophils Relative: 0 %
Eosinophils Absolute: 0.2 10*3/uL (ref 0.0–0.5)
Eosinophils Relative: 2 %
HCT: 38.6 % (ref 36.0–46.0)
Hemoglobin: 12 g/dL (ref 12.0–15.0)
Immature Granulocytes: 0 %
Lymphocytes Relative: 33 %
Lymphs Abs: 3.9 10*3/uL (ref 0.7–4.0)
MCH: 27.6 pg (ref 26.0–34.0)
MCHC: 31.1 g/dL (ref 30.0–36.0)
MCV: 88.9 fL (ref 80.0–100.0)
Monocytes Absolute: 1 10*3/uL (ref 0.1–1.0)
Monocytes Relative: 8 %
Neutro Abs: 6.6 10*3/uL (ref 1.7–7.7)
Neutrophils Relative %: 57 %
Platelets: 201 10*3/uL (ref 150–400)
RBC: 4.34 MIL/uL (ref 3.87–5.11)
RDW: 13.9 % (ref 11.5–15.5)
WBC: 11.7 10*3/uL — ABNORMAL HIGH (ref 4.0–10.5)
nRBC: 0 % (ref 0.0–0.2)

## 2023-12-29 LAB — COMPREHENSIVE METABOLIC PANEL
ALT: 20 U/L (ref 0–44)
AST: 24 U/L (ref 15–41)
Albumin: 3.8 g/dL (ref 3.5–5.0)
Alkaline Phosphatase: 81 U/L (ref 38–126)
Anion gap: 12 (ref 5–15)
BUN: 20 mg/dL (ref 6–20)
CO2: 25 mmol/L (ref 22–32)
Calcium: 9.4 mg/dL (ref 8.9–10.3)
Chloride: 101 mmol/L (ref 98–111)
Creatinine, Ser: 1.2 mg/dL — ABNORMAL HIGH (ref 0.44–1.00)
GFR, Estimated: 54 mL/min — ABNORMAL LOW (ref >60)
Glucose, Bld: 117 mg/dL — ABNORMAL HIGH (ref 70–99)
Potassium: 4.1 mmol/L (ref 3.5–5.1)
Sodium: 138 mmol/L (ref 135–145)
Total Bilirubin: 0.6 mg/dL (ref 0.0–1.2)
Total Protein: 7.6 g/dL (ref 6.5–8.1)

## 2023-12-29 LAB — BRAIN NATRIURETIC PEPTIDE: B Natriuretic Peptide: 18 pg/mL (ref 0.0–100.0)

## 2023-12-29 LAB — TROPONIN I (HIGH SENSITIVITY): Troponin I (High Sensitivity): 5 ng/L (ref <18)

## 2023-12-29 NOTE — ED Provider Notes (Signed)
 [facility] EMERGENCY DEPARTMENT AT [facility] Provider Note   CSN: [id number] Arrival date & time: 12/29/23  1543     History {Add pertinent medical, surgical, social history, OB history to HPI:1} Chief Complaint  Patient presents with   Foot Injury    Alicia Coffey is a 53 y.o. female.  Patient has a history of diabetes.  She also has a history of fractures in her left foot.  She notes her foot swelling more recently.   Foot Injury      Home Medications Prior to Admission medications   Medication Sig Start Date End Date Taking? Authorizing Provider  acetaminophen (TYLENOL) 500 MG tablet Take 1,000 mg by mouth every 6 (six) hours as needed for mild pain or headache.    [provider]  albuterol (PROVENTIL HFA;VENTOLIN HFA) 108 (90 Base) MCG/ACT inhaler Inhale 2 puffs into the lungs every 6 (six) hours as needed for wheezing or shortness of breath.    [provider]  apixaban (ELIQUIS) 5 MG TABS tablet Take 1 tablet (5 mg total) by mouth 2 (two) times daily. 10/08/23   Serena Croissant, MD  aspirin EC 81 MG tablet Take 81 mg by mouth at bedtime. Swallow whole.    [provider]  bumetanide (BUMEX) 0.5 MG tablet Take 0.5 mg by mouth 2 (two) times daily.    [provider]  ciclopirox (LOPROX) 0.77 % cream Apply 1 application topically 2 (two) times daily as needed (itching).    [provider]  clobetasol ointment (TEMOVATE) 0.05 % Apply 1 application  topically 2 (two) times daily as needed. 04/22/20   [provider]  Docusate Sodium (DSS) 100 MG CAPS Take 1 capsule by mouth 2 (two) times daily.    [provider]  Dulaglutide 4.5 MG/0.5ML SOPN  05/04/22   [provider]  DULoxetine (CYMBALTA) 60 MG capsule Take 1 capsule (60 mg total) by mouth daily. Patient taking differently: Take 60 mg by mouth 2 (two) times daily. 12/14/13   Levert Feinstein, MD  EASY COMFORT PEN NEEDLES 31G X 8 MM MISC  08/13/20    [provider]  Empagliflozin-metFORMIN HCl ER (SYNJARDY XR) 25-1000 MG TB24 Take 1 tablet by mouth at bedtime.    [provider]  Finerenone (KERENDIA) 10 MG TABS Take 10 mg by mouth as directed. On Monday, Wednesday, & Friday 06/12/21   [provider]  hydrALAZINE (APRESOLINE) 25 MG tablet Take 100 mg by mouth 2 (two) times daily. 12/03/21   [provider]  hydrocortisone cream 1 % Apply to affected area 2 times daily Patient taking differently: as needed for itching. Apply to affected area 2 times daily 08/12/15   Cheri Fowler, PA-C  hydrOXYzine (ATARAX/VISTARIL) 25 MG tablet Take 25 mg by mouth at bedtime.    [provider]  insulin glargine (LANTUS) 100 UNIT/ML injection Inject 40 Units into the skin at bedtime.    [provider]  irbesartan (AVAPRO) 150 MG tablet TAKE 1 TABLET BY MOUTH EVERY DAY 08/27/23   Nyoka Cowden, MD  meloxicam (MOBIC) 15 MG tablet Take 15 mg by mouth daily as needed for pain.    [provider]  methocarbamol (ROBAXIN) 500 MG tablet Take 500 mg by mouth 2 (two) times daily.    [provider]  metoprolol tartrate (LOPRESSOR) 25 MG tablet Take 25 mg by mouth 2 (two) times daily.    [provider]  NOVOLOG FLEXPEN 100 UNIT/ML FlexPen  Inject into the skin. Sliding scale 07/17/20   [provider]  Polysaccharide Iron Complex (POLY-IRON 150 PO) Take 1 tablet by mouth 2 (two) times daily.    [provider]  rosuvastatin (CRESTOR) 40 MG tablet Take 40 mg by mouth at bedtime.     [provider]  traMADol (ULTRAM) 50 MG tablet Take 50 mg by mouth daily.    [provider]      Allergies    Penicillins, Latex, and Other    Review of Systems   Review of Systems  Physical Exam Updated Vital Signs BP (!) 149/74   Pulse 79   Temp 98.8 F (37.1 C) (Oral)   Resp 20   Ht 5' 3.5" (1.613 m)   Wt 113 kg   SpO2 100%   BMI 43.44 kg/m  Physical  Exam  ED Results / Procedures / Treatments   Labs (all labs ordered are listed, but only abnormal results are displayed) Labs Reviewed  CBC WITH DIFFERENTIAL/PLATELET - Abnormal; Notable for the following components:      Result Value   WBC 11.7 (*)    All other components within normal limits  COMPREHENSIVE METABOLIC PANEL - Abnormal; Notable for the following components:   Glucose, Bld 117 (*)    Creatinine, Ser 1.20 (*)    GFR, Estimated 54 (*)    All other components within normal limits  BRAIN NATRIURETIC PEPTIDE  TROPONIN I (HIGH SENSITIVITY)  TROPONIN I (HIGH SENSITIVITY)    EKG None  Radiology DG Foot Complete Left Result Date: 12/29/2023 CLINICAL DATA:  Swelling and pain left foot worsening over the past month. History of left foot fracture 1 year ago. EXAM: LEFT FOOT - COMPLETE 3+ VIEW COMPARISON:  Left foot radiographs 08/20/2023, 06/21/2023, 12/28/2022, 11/04/2012; CT left foot 01/26/2023 FINDINGS: There is diffuse decreased bone mineralization. There is again lucency in the sagittal plane within the mid transverse dimension of the navicular, a chronic nonunited fracture. There is mildly increased osteolysis on both sides of the fracture line, with persistent chronic nonunion. There are multiple new irregular ossicles just lateral to the calcaneocuboid joint on frontal view. On lateral view there are increased ossicles dorsal to the navicular. These findings may be secondary to fragmentation of portions of the anterior process of the calcaneus, adjacent cuboid, and/or dorsal navicular. Moderate pes planus. 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 Moderate plantar and minimal posterior calcaneal heel spurs. IMPRESSION: 1. Chronic nonunited fracture of the navicular with mildly increased osteolysis on both sides of the fracture line. 2. Multiple new irregular ossicles just lateral to the calcaneocuboid joint and dorsal to the navicular. Therefore these  findings may be secondary to fragmentation of portions of the anterior process of the calcaneus, adjacent cuboid, and/or dorsal navicular. This is nonspecific but may be secondary to Charcot arthropathy. Recommend clinical correlation. The electronic medical record states the patient has a history of diabetes. 3. Moderate pes planus. 4. 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 Electronically Signed   By: Neita Garnet M.D.   On: 12/29/2023 17:36   US Venous Img Lower  Left (DVT Study) Result Date: 12/29/2023 CLINICAL DATA:  Left lower extremity pain and edema. History of prior left posterior tibial vein DVT. EXAM: LEFT LOWER EXTREMITY VENOUS DOPPLER ULTRASOUND TECHNIQUE: Gray-scale sonography with graded compression, as well as color Doppler and duplex ultrasound were performed to evaluate the lower extremity deep venous systems from the level of the common femoral vein and including the common femoral, femoral, profunda femoral,  popliteal and calf veins including the posterior tibial, peroneal and gastrocnemius veins when visible. The superficial great saphenous vein was also interrogated. Spectral Doppler was utilized to evaluate flow at rest and with distal augmentation maneuvers in the common femoral, femoral and popliteal veins. COMPARISON:  Prior study on 08/20/2023 FINDINGS: Contralateral Common Femoral Vein: Respiratory phasicity is normal and symmetric with the symptomatic side. No evidence of thrombus. Normal compressibility. Common Femoral Vein: No evidence of thrombus. Normal compressibility, respiratory phasicity and response to augmentation. Saphenofemoral Junction: No evidence of thrombus. Normal compressibility and flow on color Doppler imaging. Profunda Femoral Vein: No evidence of thrombus. Normal compressibility and flow on color Doppler imaging. Femoral Vein: No evidence of thrombus. Normal compressibility, respiratory phasicity and response to augmentation.  Popliteal Vein: No evidence of thrombus. Normal compressibility, respiratory phasicity and response to augmentation. Calf Veins: No evidence of thrombus. No further visualized left posterior vein thrombus. Normal compressibility and flow on color Doppler imaging. Superficial Great Saphenous Vein: No evidence of thrombus. Normal compressibility. Venous Reflux:  None. Other Findings: No evidence of superficial thrombophlebitis or abnormal fluid collection. IMPRESSION: No evidence of left lower extremity deep venous thrombosis. No further visualized left posterior tibial vein thrombus. Electronically Signed   By: Irish Lack M.D.   On: 12/29/2023 16:54    Procedures Procedures  {Document cardiac monitor, telemetry assessment procedure when appropriate:1}  Medications Ordered in ED Medications - No data to display  ED Course/ Medical Decision Making/ A&P   {   Click here for ABCD2, HEART and other calculatorsREFRESH Note before signing :1}                              Medical Decision Making Amount and/or Complexity of Data Reviewed Labs: ordered. Radiology: ordered.  Patient with charcot foot, she is put in a cam walker and referred back to her podiatrist  {Document critical care time when appropriate:1} {Document review of labs and clinical decision tools ie heart score, Chads2Vasc2 etc:1}  {Document your independent review of radiology images, and any outside records:1} {Document your discussion with family members, caretakers, and with consultants:1} {Document social determinants of health affecting pt's care:1} {Document your decision making why or why not admission, treatments were needed:1} Final Clinical Impression(s) / ED Diagnoses Final diagnoses:  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

## 2023-12-29 NOTE — Discharge Instructions (Signed)
 Follow-up with your foot doctor in the next 1 to 2 weeks.  Try to stay off her foot is much as possible

## 2023-12-29 NOTE — ED Triage Notes (Signed)
 Pt arrived via POV c/o swelling and pain in her left foot that has been worsening for over a week. Pt reports going to Urgent Care PTA and being advised to seek further evaluation in the ER. Pt presents with +2 edema in LLE.

## 2024-01-07 ENCOUNTER — Ambulatory Visit (INDEPENDENT_AMBULATORY_CARE_PROVIDER_SITE_OTHER): Admitting: Podiatry

## 2024-01-07 DIAGNOSIS — M14672 Charcot's joint, left ankle and foot: Secondary | ICD-10-CM | POA: Diagnosis not present

## 2024-01-07 NOTE — Progress Notes (Signed)
 Subjective:  Patient ID: Alicia Coffey, female    DOB: 1971-03-09,  MRN: [id number]  Chief Complaint  Patient presents with   Charcot foot     Pt stated she was seen in the ED on 3/19 and was told she had Charcot foot     53 y.o. female presents with the above complaint.  Patient presents with complaint of left Charcot deformity.  Patient was seen in emergency room on 319 was told she has Charcot foot.  She does have type 2 diabetes which likely contributed to the neuropathy and the Charcot foot.  She wanted to discuss treatment options for this.  She has not seen a  Paresthesia.  Pain scale is 5 out of 10 dull aching nature and slightly swollen joints noted.  No redness noted.   Review of Systems: Negative except as noted in the HPI. Denies N/V/F/Ch.  Past Medical History:  Diagnosis Date   Anemia    Anxiety    Arthritis    Right knee   CKD (chronic kidney disease) stage 2, GFR 60-89 ml/min    Dr. 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   Morbid obesity with BMI of 40.0-44.9, adult (HCC)    Obstructive sleep apnea of adult 2017   Oropharyngeal dysphagia    Peripheral neuropathy    Proteinuria    Secondary hyperparathyroidism (HCC)    Shortness of breath on exertion    Type 2 diabetes mellitus (HCC)    Uterine fibroid    Vertigo 2015   Vitamin D deficiency    Wears dentures     Current Outpatient Medications:    acetaminophen (TYLENOL) 500 MG tablet, Take 1,000 mg by mouth every 6 (six) hours as needed for mild pain or headache., Disp: , Rfl:    albuterol (PROVENTIL HFA;VENTOLIN HFA) 108 (90 Base) MCG/ACT inhaler, Inhale 2 puffs into the lungs every 6 (six) hours as needed for wheezing  or shortness of breath., Disp: , Rfl:    apixaban (ELIQUIS) 5 MG TABS tablet, Take 1 tablet (5 mg total) by mouth 2 (two) times daily., Disp: 60 tablet, Rfl: 4   aspirin EC 81 MG tablet, Take 81 mg by mouth at bedtime. Swallow whole., Disp: , Rfl:    bumetanide (BUMEX) 0.5 MG tablet, Take 0.5 mg by mouth 2 (two) times daily., Disp: , Rfl:    ciclopirox (LOPROX) 0.77 % cream, Apply 1 application topically 2 (two) times daily as needed (itching)., Disp: , Rfl:    clobetasol ointment (TEMOVATE) 0.05 %, Apply 1 application  topically 2 (two) times daily as needed., Disp: , Rfl:    Docusate Sodium (DSS) 100 MG CAPS, Take 1 capsule by mouth 2 (two) times daily., Disp: , Rfl:    Dulaglutide 4.5 MG/0.5ML SOPN, , Disp: , Rfl:    DULoxetine (CYMBALTA) 60 MG capsule, Take 1 capsule (60 mg total) by mouth daily. (Patient taking differently: Take 60 mg by mouth 2 (two) times daily.), Disp: 30 capsule, Rfl: 12   EASY COMFORT PEN NEEDLES 31G X 8 MM MISC, , Disp: , Rfl:    Empagliflozin-metFORMIN HCl ER (SYNJARDY XR) 25-1000 MG TB24,  Take 1 tablet by mouth at bedtime., Disp: , Rfl:    Finerenone (KERENDIA) 10 MG TABS, Take 10 mg by mouth as directed. On Monday, Wednesday, & Friday, Disp: , Rfl:    hydrALAZINE (APRESOLINE) 25 MG tablet, Take 100 mg by mouth 2 (two) times daily., Disp: , Rfl:    hydrocortisone cream 1 %, Apply to affected area 2 times daily (Patient taking differently: as needed for itching. Apply to affected area 2 times daily), Disp: 15 g, Rfl: 0   hydrOXYzine (ATARAX/VISTARIL) 25 MG tablet, Take 25 mg by mouth at bedtime., Disp: , Rfl:    insulin glargine (LANTUS) 100 UNIT/ML injection, Inject 40 Units into the skin at bedtime., Disp: , Rfl:    irbesartan (AVAPRO) 150 MG tablet, TAKE 1 TABLET BY MOUTH EVERY DAY, Disp: 30 tablet, Rfl: 11   meloxicam (MOBIC) 15 MG tablet, Take 15 mg by mouth daily as needed for pain., Disp: , Rfl:    methocarbamol (ROBAXIN) 500 MG tablet, Take 500 mg by mouth 2  (two) times daily., Disp: , Rfl:    metoprolol tartrate (LOPRESSOR) 25 MG tablet, Take 25 mg by mouth 2 (two) times daily., Disp: , Rfl:    NOVOLOG FLEXPEN 100 UNIT/ML FlexPen, Inject into the skin. Sliding scale, Disp: , Rfl:    Polysaccharide Iron Complex (POLY-IRON 150 PO), Take 1 tablet by mouth 2 (two) times daily., Disp: , Rfl:    rosuvastatin (CRESTOR) 40 MG tablet, Take 40 mg by mouth at bedtime. , Disp: , Rfl:    traMADol (ULTRAM) 50 MG tablet, Take 50 mg by mouth daily., Disp: , Rfl:   Social History   Tobacco Use  Smoking Status Never   Passive exposure: Never  Smokeless Tobacco Never    Allergies  Allergen Reactions   Penicillins Other (See Comments)    PATIENT HAS HAD A PCN REACTION WITH IMMEDIATE RASH, FACIAL/TONGUE/THROAT SWELLING, SOB, OR LIGHTHEADEDNESS WITH HYPOTENSION:  #  #  YES  #  #  Has patient had a PCN reaction causing severe rash involving mucus membranes or skin necrosis: No Has patient had a PCN reaction that required hospitalization No Has patient had a PCN reaction occurring within the last 10 years: No    Latex    Other Hives, Itching and Rash    Powder in gloves Arthropod Insect   Objective:  There were no vitals filed for this visit. There is no height or weight on file to calculate BMI. Constitutional Well developed. Well nourished.  Vascular Dorsalis pedis pulses palpable bilaterally. Posterior tibial pulses palpable bilaterally. Capillary refill normal to all digits.  No cyanosis or clubbing noted. Pedal hair growth normal.  Neurologic Normal speech. Oriented to person, place, and time. Epicritic sensation to light touch grossly present bilaterally.  Dermatologic Nails well groomed and normal in appearance. No open wounds. No skin lesions.  Orthopedic: Mild warmth noted to the left foot.  Palpable pulses noted.  Charcot deformity clinically appreciated appreciated with rocker-bottom foot deformity.  No wounds or lesion noted.  No  prominent bony spots noted.   Radiographs: 3 views of skeletally mature adult left foot: Weight: Hide foot breakdown noted at the talonavicular joint calcaneocuboid joint with multiple breakdown and sclerosis.  Lisfranc interval is intact.  No open wounds or lesions noted Assessment:  No diagnosis found. Plan:  Patient was evaluated and treated and all questions answered.  Left Charcot foot deformity with underlying history of diabetes - All questions and concerns were discussed with  the patient in extensive detail.  I encouraged her to be more nonweightbearing to the left lower extremity with Cam boot.  She states understanding will try her best.  She ultimately need Crow walker to help with Charcot deformity I gave her a prescription for arteria need to obtain [facility] walker.  She states understanding.  No follow-ups on file.   Left Charcot foot Crow walker prescription given for [employer] and prosthetic

## 2024-01-12 ENCOUNTER — Ambulatory Visit: Payer: Medicaid Other | Admitting: Podiatry

## 2024-02-14 ENCOUNTER — Ambulatory Visit (INDEPENDENT_AMBULATORY_CARE_PROVIDER_SITE_OTHER): Payer: Medicaid Other | Admitting: Podiatry

## 2024-02-14 ENCOUNTER — Encounter: Payer: Self-pay | Admitting: Podiatry

## 2024-02-14 DIAGNOSIS — E0843 Diabetes mellitus due to underlying condition with diabetic autonomic (poly)neuropathy: Secondary | ICD-10-CM | POA: Diagnosis not present

## 2024-02-14 DIAGNOSIS — M79609 Pain in unspecified limb: Secondary | ICD-10-CM

## 2024-02-14 DIAGNOSIS — B351 Tinea unguium: Secondary | ICD-10-CM | POA: Diagnosis not present

## 2024-02-14 DIAGNOSIS — M79676 Pain in unspecified toe(s): Secondary | ICD-10-CM

## 2024-02-14 NOTE — Progress Notes (Signed)
 This patient returns to my office for at risk foot care.  This patient requires this care by a professional since this patient will be at risk due to having diabetes and charcot foot left. This patient is unable to cut nails herself since the patient cannot reach her nails.These nails are painful walking and wearing shoes.  This patient presents for at risk foot care today.  General Appearance  Alert, conversant and in no acute stress.  Vascular  Dorsalis pedis and posterior tibial  pulses are palpable  bilaterally.  Capillary return is within normal limits  bilaterally. Temperature is within normal limits  bilaterally.  Neurologic  Senn-Weinstein monofilament wire test within normal limits  bilaterally. Muscle power within normal limits bilaterally.  Nails Thick disfigured discolored nails with subungual debris  from hallux to fifth toes bilaterally. No evidence of bacterial infection or drainage bilaterally.  Orthopedic  No limitations of motion  feet .  No crepitus or effusions noted.  No bony pathology or digital deformities noted. Swelling and increased temperature left ankle.  Skin  normotropic skin with no porokeratosis noted bilaterally.  No signs of infections or ulcers noted.     Onychomycosis  Pain in right toes  Pain in left toes  Consent was obtained for treatment procedures.   Mechanical debridement of nails 1-5  bilaterally performed with a nail nipper.  Filed with dremel without incident.    Return office visit   3 months                   Told patient to return for periodic foot care and evaluation due to potential at risk complications.   Ruffin Cotton DPM

## 2024-02-15 ENCOUNTER — Telehealth: Payer: Self-pay | Admitting: Podiatry

## 2024-02-15 NOTE — Telephone Encounter (Signed)
 [facility] called in to make sure medicaid form with a referral has been received and signed.

## 2024-02-17 ENCOUNTER — Ambulatory Visit (HOSPITAL_BASED_OUTPATIENT_CLINIC_OR_DEPARTMENT_OTHER)
Admission: RE | Admit: 2024-02-17 | Discharge: 2024-02-17 | Disposition: A | Discharge status: Home / Self Care | Payer: Medicaid Other | Source: Ambulatory Visit | Attending: Hematology and Oncology | Admitting: Hematology and Oncology

## 2024-02-17 DIAGNOSIS — I82402 Acute embolism and thrombosis of unspecified deep veins of left lower extremity: Secondary | ICD-10-CM

## 2024-02-18 ENCOUNTER — Inpatient Hospital Stay (HOSPITAL_COMMUNITY)
Admission: EM | Admit: 2024-02-18 | Discharge: 2024-02-21 | DRG: 074 | Disposition: A | Discharge status: Home / Self Care | Attending: Family Medicine | Admitting: Family Medicine

## 2024-02-18 ENCOUNTER — Other Ambulatory Visit: Payer: Self-pay

## 2024-02-18 ENCOUNTER — Emergency Department (HOSPITAL_COMMUNITY)

## 2024-02-18 ENCOUNTER — Encounter (HOSPITAL_COMMUNITY): Payer: Self-pay | Admitting: *Deleted

## 2024-02-18 DIAGNOSIS — E66813 Obesity, class 3: Secondary | ICD-10-CM | POA: Diagnosis present

## 2024-02-18 DIAGNOSIS — Z803 Family history of malignant neoplasm of breast: Secondary | ICD-10-CM

## 2024-02-18 DIAGNOSIS — E11628 Type 2 diabetes mellitus with other skin complications: Principal | ICD-10-CM | POA: Diagnosis present

## 2024-02-18 DIAGNOSIS — M009 Pyogenic arthritis, unspecified: Secondary | ICD-10-CM | POA: Diagnosis present

## 2024-02-18 DIAGNOSIS — E785 Hyperlipidemia, unspecified: Secondary | ICD-10-CM | POA: Diagnosis present

## 2024-02-18 DIAGNOSIS — N2581 Secondary hyperparathyroidism of renal origin: Secondary | ICD-10-CM | POA: Diagnosis present

## 2024-02-18 DIAGNOSIS — Z86000 Personal history of in-situ neoplasm of breast: Secondary | ICD-10-CM | POA: Diagnosis not present

## 2024-02-18 DIAGNOSIS — Z8249 Family history of ischemic heart disease and other diseases of the circulatory system: Secondary | ICD-10-CM

## 2024-02-18 DIAGNOSIS — I10 Essential (primary) hypertension: Secondary | ICD-10-CM | POA: Diagnosis present

## 2024-02-18 DIAGNOSIS — L02612 Cutaneous abscess of left foot: Secondary | ICD-10-CM | POA: Diagnosis present

## 2024-02-18 DIAGNOSIS — M79672 Pain in left foot: Secondary | ICD-10-CM | POA: Diagnosis present

## 2024-02-18 DIAGNOSIS — Z794 Long term (current) use of insulin: Secondary | ICD-10-CM

## 2024-02-18 DIAGNOSIS — M869 Osteomyelitis, unspecified: Secondary | ICD-10-CM | POA: Diagnosis present

## 2024-02-18 DIAGNOSIS — N182 Chronic kidney disease, stage 2 (mild): Secondary | ICD-10-CM | POA: Diagnosis present

## 2024-02-18 DIAGNOSIS — L0390 Cellulitis, unspecified: Secondary | ICD-10-CM | POA: Diagnosis present

## 2024-02-18 DIAGNOSIS — Z833 Family history of diabetes mellitus: Secondary | ICD-10-CM | POA: Diagnosis not present

## 2024-02-18 DIAGNOSIS — Z7901 Long term (current) use of anticoagulants: Secondary | ICD-10-CM | POA: Diagnosis not present

## 2024-02-18 DIAGNOSIS — E1122 Type 2 diabetes mellitus with diabetic chronic kidney disease: Secondary | ICD-10-CM | POA: Diagnosis present

## 2024-02-18 DIAGNOSIS — E1169 Type 2 diabetes mellitus with other specified complication: Secondary | ICD-10-CM | POA: Diagnosis present

## 2024-02-18 DIAGNOSIS — E11610 Type 2 diabetes mellitus with diabetic neuropathic arthropathy: Secondary | ICD-10-CM | POA: Diagnosis present

## 2024-02-18 DIAGNOSIS — Z86718 Personal history of other venous thrombosis and embolism: Secondary | ICD-10-CM

## 2024-02-18 DIAGNOSIS — M797 Fibromyalgia: Secondary | ICD-10-CM | POA: Diagnosis present

## 2024-02-18 DIAGNOSIS — Z79899 Other long term (current) drug therapy: Secondary | ICD-10-CM

## 2024-02-18 DIAGNOSIS — L03116 Cellulitis of left lower limb: Secondary | ICD-10-CM | POA: Diagnosis present

## 2024-02-18 DIAGNOSIS — E559 Vitamin D deficiency, unspecified: Secondary | ICD-10-CM | POA: Diagnosis present

## 2024-02-18 DIAGNOSIS — Z880 Allergy status to penicillin: Secondary | ICD-10-CM

## 2024-02-18 DIAGNOSIS — I129 Hypertensive chronic kidney disease with stage 1 through stage 4 chronic kidney disease, or unspecified chronic kidney disease: Secondary | ICD-10-CM | POA: Diagnosis present

## 2024-02-18 DIAGNOSIS — I82502 Chronic embolism and thrombosis of unspecified deep veins of left lower extremity: Secondary | ICD-10-CM

## 2024-02-18 DIAGNOSIS — Z91040 Latex allergy status: Secondary | ICD-10-CM

## 2024-02-18 DIAGNOSIS — F32A Depression, unspecified: Secondary | ICD-10-CM | POA: Diagnosis present

## 2024-02-18 DIAGNOSIS — Z6841 Body Mass Index (BMI) 40.0 and over, adult: Secondary | ICD-10-CM

## 2024-02-18 LAB — CBC WITH DIFFERENTIAL/PLATELET
Abs Immature Granulocytes: 0.04 10*3/uL (ref 0.00–0.07)
Basophils Absolute: 0.1 10*3/uL (ref 0.0–0.1)
Basophils Relative: 0 %
Eosinophils Absolute: 0.5 10*3/uL (ref 0.0–0.5)
Eosinophils Relative: 4 %
HCT: 39 % (ref 36.0–46.0)
Hemoglobin: 12.4 g/dL (ref 12.0–15.0)
Immature Granulocytes: 0 %
Lymphocytes Relative: 20 %
Lymphs Abs: 2.8 10*3/uL (ref 0.7–4.0)
MCH: 27.9 pg (ref 26.0–34.0)
MCHC: 31.8 g/dL (ref 30.0–36.0)
MCV: 87.8 fL (ref 80.0–100.0)
Monocytes Absolute: 1.1 10*3/uL — ABNORMAL HIGH (ref 0.1–1.0)
Monocytes Relative: 8 %
Neutro Abs: 9.7 10*3/uL — ABNORMAL HIGH (ref 1.7–7.7)
Neutrophils Relative %: 68 %
Platelets: 229 10*3/uL (ref 150–400)
RBC: 4.44 MIL/uL (ref 3.87–5.11)
RDW: 14.9 % (ref 11.5–15.5)
WBC: 14.3 10*3/uL — ABNORMAL HIGH (ref 4.0–10.5)
nRBC: 0 % (ref 0.0–0.2)

## 2024-02-18 LAB — BASIC METABOLIC PANEL WITH GFR
Anion gap: 11 (ref 5–15)
BUN: 21 mg/dL — ABNORMAL HIGH (ref 6–20)
CO2: 25 mmol/L (ref 22–32)
Calcium: 9.5 mg/dL (ref 8.9–10.3)
Chloride: 99 mmol/L (ref 98–111)
Creatinine, Ser: 0.99 mg/dL (ref 0.44–1.00)
GFR, Estimated: >60 mL/min (ref >60)
Glucose, Bld: 162 mg/dL — ABNORMAL HIGH (ref 70–99)
Potassium: 3.8 mmol/L (ref 3.5–5.1)
Sodium: 135 mmol/L (ref 135–145)

## 2024-02-18 LAB — SEDIMENTATION RATE: Sed Rate: 60 mm/h — ABNORMAL HIGH (ref 0–22)

## 2024-02-18 MED ORDER — METRONIDAZOLE 500 MG/100ML IV SOLN
500.0000 mg | Freq: Once | INTRAVENOUS | Status: AC
  Administered 2024-02-18: 500 mg via INTRAVENOUS
  Filled 2024-02-18: qty 100

## 2024-02-18 MED ORDER — ROSUVASTATIN CALCIUM 20 MG PO TABS
40.0000 mg | ORAL_TABLET | Freq: Every day | ORAL | Status: DC
  Administered 2024-02-19 – 2024-02-20 (×3): 40 mg via ORAL
  Filled 2024-02-18 (×3): qty 2

## 2024-02-18 MED ORDER — APIXABAN 5 MG PO TABS
5.0000 mg | ORAL_TABLET | Freq: Two times a day (BID) | ORAL | Status: DC
  Administered 2024-02-19 – 2024-02-21 (×6): 5 mg via ORAL
  Filled 2024-02-18 (×6): qty 1

## 2024-02-18 MED ORDER — LINEZOLID 600 MG/300ML IV SOLN
600.0000 mg | Freq: Once | INTRAVENOUS | Status: AC
  Administered 2024-02-19: 600 mg via INTRAVENOUS
  Filled 2024-02-18 (×2): qty 300

## 2024-02-18 MED ORDER — DULOXETINE HCL 60 MG PO CPEP: 60.0000 mg | ORAL_CAPSULE | Freq: Every day | ORAL | Status: DC

## 2024-02-18 MED ORDER — IRBESARTAN 150 MG PO TABS
150.0000 mg | ORAL_TABLET | Freq: Every day | ORAL | Status: DC
  Administered 2024-02-19 – 2024-02-21 (×3): 150 mg via ORAL
  Filled 2024-02-18 (×3): qty 1

## 2024-02-18 MED ORDER — ACETAMINOPHEN 500 MG PO TABS
1000.0000 mg | ORAL_TABLET | Freq: Once | ORAL | Status: AC
Start: 2024-02-18 — End: 2024-02-18
  Administered 2024-02-18: 1000 mg via ORAL
  Filled 2024-02-18: qty 2

## 2024-02-18 MED ORDER — ACETAMINOPHEN 650 MG RE SUPP: 650.0000 mg | Freq: Four times a day (QID) | RECTAL | Status: DC | PRN

## 2024-02-18 MED ORDER — ONDANSETRON HCL 4 MG/2ML IJ SOLN: 4.0000 mg | Freq: Four times a day (QID) | INTRAMUSCULAR | Status: DC | PRN

## 2024-02-18 MED ORDER — HYDROXYZINE HCL 25 MG PO TABS
25.0000 mg | ORAL_TABLET | Freq: Every day | ORAL | Status: DC
  Administered 2024-02-19 – 2024-02-20 (×3): 25 mg via ORAL
  Filled 2024-02-18 (×3): qty 1

## 2024-02-18 MED ORDER — GADOBUTROL 1 MMOL/ML IV SOLN
10.0000 mL | Freq: Once | INTRAVENOUS | Status: AC | PRN
  Administered 2024-02-18: 10 mL via INTRAVENOUS

## 2024-02-18 MED ORDER — ASPIRIN 81 MG PO TBEC
81.0000 mg | DELAYED_RELEASE_TABLET | Freq: Every day | ORAL | Status: DC
  Administered 2024-02-19 (×2): 81 mg via ORAL
  Filled 2024-02-18 (×2): qty 1

## 2024-02-18 MED ORDER — HYDRALAZINE HCL 25 MG PO TABS
100.0000 mg | ORAL_TABLET | Freq: Two times a day (BID) | ORAL | Status: DC
  Administered 2024-02-19 – 2024-02-21 (×6): 100 mg via ORAL
  Filled 2024-02-18 (×6): qty 4

## 2024-02-18 MED ORDER — SODIUM CHLORIDE 0.9 % IV SOLN
2.0000 g | Freq: Once | INTRAVENOUS | Status: AC
  Administered 2024-02-18: 2 g via INTRAVENOUS
  Filled 2024-02-18: qty 20

## 2024-02-18 MED ORDER — ACETAMINOPHEN 325 MG PO TABS: 650.0000 mg | ORAL_TABLET | Freq: Four times a day (QID) | ORAL | Status: DC | PRN

## 2024-02-18 MED ORDER — ONDANSETRON HCL 4 MG PO TABS: 4.0000 mg | ORAL_TABLET | Freq: Four times a day (QID) | ORAL | Status: DC | PRN

## 2024-02-18 MED ORDER — METOPROLOL TARTRATE 25 MG PO TABS
25.0000 mg | ORAL_TABLET | Freq: Two times a day (BID) | ORAL | Status: DC
  Administered 2024-02-19 – 2024-02-21 (×6): 25 mg via ORAL
  Filled 2024-02-18 (×6): qty 1

## 2024-02-18 NOTE — ED Notes (Signed)
 Patient's mother took her Cam boot and cane home.

## 2024-02-18 NOTE — ED Triage Notes (Signed)
 Pt with c/o left foot pain, pt seen for same here back in March and has CAM boot on.

## 2024-02-18 NOTE — ED Provider Notes (Signed)
  EMERGENCY DEPARTMENT AT [facility] Provider Note  CSN: [id number] Arrival date & time: 02/18/24 1529  Chief Complaint(s) Foot Pain  HPI Alicia Coffey is a 53 y.o. female history of diabetes, Charcot foot, presenting to the emergency department with foot pain.  Patient reports that normally she cannot feel her foot, has been wearing a cam boot and trying to limit weightbearing.  Lately she has noticed some pain in her foot as well as swelling.  No fevers or chills.  She reports normally she can elevate her foot and the swelling goes down but lately this has not helped.  Saw podiatrist earlier this week but symptoms have already worsening.  No nausea or vomiting.  No chest pain.  No other symptoms.   Past Medical History Past Medical History:  Diagnosis Date   Anemia    Anxiety    Arthritis    Right knee   CKD (chronic kidney disease) stage 2, GFR 60-89 ml/min    Dr. 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   Morbid obesity with BMI of 40.0-44.9, adult (HCC)    Obstructive sleep apnea of adult 2017   Oropharyngeal dysphagia    Peripheral neuropathy    Proteinuria    Secondary hyperparathyroidism (HCC)    Shortness of breath on exertion    Type 2 diabetes mellitus (HCC)    Uterine fibroid    Vertigo 2015   Vitamin D deficiency    Wears dentures    Patient Active Problem List   Diagnosis Date Noted   Osteomyelitis (HCC) 02/18/2024   Leg DVT (deep venous thromboembolism), acute, left (HCC) 09/20/2023   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   Morbid obesity due to excess calories (HCC) 10/09/2022   Tired 05/20/2022   History of breast cancer 05/20/2022   Meralgia  paraesthetica 08/10/2021   Encounter for gynecological examination with Papanicolaou smear of cervix 04/22/2021   Encounter for screening fecal occult blood testing 04/22/2021   Sweats, menopausal 03/24/2021   Hot flashes 03/24/2021   Weakness 03/24/2021   Dyspnea on exertion 03/24/2021   Perimenopause 03/24/2021   Degeneration of lumbosacral intervertebral disc 07/31/2020   Congenital pes planus 07/31/2020   Encounter for orthopedic follow-up care 12/14/2019   Acquired trigger finger of left ring finger 11/15/2019   Digital mucinous cyst of finger 11/15/2019   Triggering of finger 10/17/2019   Other obesity due to excess calories 07/19/2019   Chronic kidney disease due to type 2 diabetes mellitus (HCC) 07/19/2019   Secondary hyperparathyroidism (HCC) 07/19/2019   Vitamin D deficiency 07/19/2019   Pain in finger of right hand 06/15/2017   Sprain of interphalangeal joint of right ring finger 06/15/2017   Diabetes 1.5, 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   Idiopathic peripheral neuropathy 09/21/2016   Obesity (BMI 35.0-39.9 without comorbidity) 09/21/2016   Obstructive sleep apnea of adult 09/21/2016   Oropharyngeal dysphagia 09/21/2016   Vertigo 09/21/2016   Menorrhagia with irregular cycle 07/25/2014   Iron deficiency anemia due to chronic blood loss 07/25/2014   Encounter for follow-up 05/23/2014   Fibroid, uterine 04/04/2014   Abnormal uterine bleeding (AUB) 03/26/2014  Diabetic peripheral neuropathy (HCC) 12/21/2013   Low back pain 12/11/2013   Pain in joint, shoulder region 12/11/2013   Pain in limb 12/11/2013   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 Authorizing Provider  acetaminophen  (TYLENOL ) 500 MG tablet Take  1,000 mg by mouth every 6 (six) hours as needed for mild pain or headache.    [provider]  albuterol (PROVENTIL HFA;VENTOLIN HFA) 108 (90 Base) MCG/ACT inhaler Inhale 2 puffs into the lungs every 6 (six) hours as needed for wheezing or shortness of breath.    [provider]  apixaban  (ELIQUIS ) 5 MG TABS tablet Take 1 tablet (5 mg total) by mouth 2 (two) times daily. 10/08/23   Gudena, Vinay, MD  aspirin EC 81 MG tablet Take 81 mg by mouth at bedtime. Swallow whole.    [provider]  bumetanide (BUMEX) 0.5 MG tablet Take 0.5 mg by mouth 2 (two) times daily.    [provider]  ciclopirox (LOPROX) 0.77 % cream Apply 1 application topically 2 (two) times daily as needed (itching).    [provider]  clobetasol ointment (TEMOVATE) 0.05 % Apply 1 application  topically 2 (two) times daily as needed. 04/22/20   [provider]  Docusate Sodium (DSS) 100 MG CAPS Take 1 capsule by mouth 2 (two) times daily.    [provider]  Dulaglutide 4.5 MG/0.5ML SOPN  05/04/22   [provider]  DULoxetine  (CYMBALTA ) 60 MG capsule Take 1 capsule (60 mg total) by mouth daily. Patient taking differently: Take 60 mg by mouth 2 (two) times daily. 12/14/13   Phebe Brasil, MD  EASY COMFORT PEN NEEDLES 31G X 8 MM MISC  08/13/20   [provider]  Empagliflozin-metFORMIN HCl ER (SYNJARDY XR) 25-1000 MG TB24 Take 1 tablet by mouth at bedtime.    [provider]  Finerenone (KERENDIA) 10 MG TABS Take 10 mg by mouth as directed. On Monday, Wednesday, & Friday 06/12/21   [provider]  hydrALAZINE (APRESOLINE) 25 MG tablet Take 100 mg by mouth 2 (two) times daily. 12/03/21   [provider]  hydrocortisone  cream 1 % Apply to affected area 2 times daily Patient taking differently: as needed for itching. Apply to affected area 2 times daily 08/12/15   Rose, Kayla, PA-C  hydrOXYzine (ATARAX/VISTARIL) 25 MG tablet Take 25 mg by  mouth at bedtime.    [provider]  insulin  glargine (LANTUS ) 100 UNIT/ML injection Inject 40 Units into the skin at bedtime.    [provider]  irbesartan  (AVAPRO ) 150 MG tablet TAKE 1 TABLET BY MOUTH EVERY DAY 08/27/23   Wert, Michael B, MD  meloxicam  (MOBIC ) 15 MG tablet Take 15 mg by mouth daily as needed for pain.    [provider]  methocarbamol (ROBAXIN) 500 MG tablet Take 500 mg by mouth 2 (two) times daily.    [provider]  metoprolol  tartrate (LOPRESSOR ) 25 MG tablet Take 25 mg by mouth 2 (two) times daily.    [provider]  NOVOLOG FLEXPEN 100 UNIT/ML FlexPen Inject into the skin. Sliding scale 07/17/20   [provider]  Polysaccharide Iron Complex (POLY-IRON 150 PO) Take 1 tablet by mouth 2 (two) times daily.    [provider]  rosuvastatin (CRESTOR) 40 MG tablet Take 40 mg by mouth at bedtime.     [provider]  traMADol (ULTRAM) 50 MG tablet Take 50 mg by mouth daily.    [provider]                                                                                                                                    Past Surgical History Past Surgical History:  Procedure Laterality Date   ABLATION     uterine    BREAST BIOPSY Left 2014   benign   BREAST BIOPSY Right 2013   high risk   BREAST BIOPSY Left 2021   benign   BREAST BIOPSY Left 2021   benign   BREAST BIOPSY Right 07/22/2023   US  RT BREAST BX W LOC DEV 1ST LESION IMG BX SPEC US  GUIDE 07/22/2023 [facility]   BREAST EXCISIONAL BIOPSY Left    BREAST LUMPECTOMY Left 2021   LCIS   BREAST LUMPECTOMY WITH NEEDLE LOCALIZATION  08/15/2012   Procedure: BREAST LUMPECTOMY WITH NEEDLE LOCALIZATION;  Surgeon: Darcella Earnest, MD;  Location: [facility];  Service: General;  Laterality: Right;  Wire localizations Right breast calcifications   BREAST LUMPECTOMY WITH RADIOACTIVE SEED LOCALIZATION Left 01/30/2020    Procedure: LEFT BREAST LUMPECTOMY X 2 WITH RADIOACTIVE SEED LOCALIZATION;  Surgeon: Oza Blumenthal, MD;  Location: [facility];  Service: General;  Laterality: Left;   CARPAL TUNNEL RELEASE Right 11/24/2017   Procedure: RIGHT CARPAL TUNNEL RELEASE;  Surgeon: Audie Bleacher, MD;  Location: [facility];  Service: Neurosurgery;  Laterality: Right;  Right CARPAL TUNNEL RELEASE   CARPAL TUNNEL RELEASE Left 08/31/2018   Procedure: LEFT CARPAL TUNNEL RELEASE;  Surgeon: Audie Bleacher, MD;  Location: [facility];  Service: Neurosurgery;  Laterality: Left;   DILITATION & CURRETTAGE/HYSTROSCOPY WITH THERMACHOICE ABLATION N/A 08/21/2014   Procedure: DILATATION & CURETTAGE/HYSTEROSCOPY WITH THERMACHOICE ABLATION;  Surgeon: Albino Hum, MD;  Location: [facility];  Service: Gynecology;  Laterality: N/A;   KNEE ARTHROSCOPY Left 10/16/2015   Procedure: ARTHROSCOPY KNEE with debridment;  Surgeon: Wendolyn Hamburger, MD;  Location: [facility];  Service: Orthopedics;  Laterality: Left;   KNEE ARTHROSCOPY WITH MEDIAL MENISECTOMY Right 07/17/2015   Procedure: KNEE ARTHROSCOPY WITH MEDIAL MENISECTOMY;  Surgeon: Wendolyn Hamburger, MD;  Location: [facility];  Service: Orthopedics;  Laterality: Right;   POLYPECTOMY N/A 08/21/2014   Procedure: ENDOMETRIAL POLYPECTOMY;  Surgeon: Albino Hum, MD;  Location: [facility];  Service: Gynecology;  Laterality: N/A;   REFRACTIVE SURGERY Right    micro aneuysms   TRIGGER FINGER RELEASE Left    ULNAR NERVE TRANSPOSITION Right 11/24/2017   Procedure: RIGHT ULNAR NERVE DECOMPRESSION/TRANSPOSITION;  Surgeon: Audie Bleacher, MD;  Location: [facility];  Service: Neurosurgery;  Laterality: Right;  Right ULNAR NERVE DECOMPRESSION/TRANSPOSITION   Family History Family History  Problem Relation Age of Onset  Diabetes Mother    Heart disease Mother    Hypertension Mother    Diabetes Father    Heart disease Father    Hypertension Father    Heart attack Father    Diabetes  Sister    Fibromyalgia Sister    Diabetes Sister    Diabetes Brother    Heart attack Maternal Grandmother        <35   Heart attack Maternal Grandfather    Breast cancer Paternal Grandmother 66       breast; dbl mastectomy   Heart attack Paternal Grandfather    Colon polyps Neg Hx    Colon cancer Neg Hx    Crohn's disease Neg Hx    Esophageal cancer Neg Hx    Rectal cancer Neg Hx    Stomach cancer Neg Hx     Social History Social History   Tobacco Use   Smoking status: Never    Passive exposure: Never   Smokeless tobacco: Never  Vaping Use   Vaping status: Never Used  Substance Use Topics   Alcohol use: No   Drug use: No   Allergies Penicillins, Latex, and Other  Review of Systems Review of Systems  All other systems reviewed and are negative.   Physical Exam Vital Signs  I have reviewed the triage vital signs BP 134/71 (BP Location: Right Arm)   Pulse 82   Temp 98.2 F (36.8 C) (Oral)   Resp 17   Ht 5' 3.5" (1.613 m)   Wt 113 kg   SpO2 98%   BMI 43.44 kg/m  Physical Exam Vitals and nursing note reviewed.  Constitutional:      General: She is not in acute distress.    Appearance: She is well-developed.  HENT:     Head: Normocephalic and atraumatic.     Mouth/Throat:     Mouth: Mucous membranes are moist.  Eyes:     Pupils: Pupils are equal, round, and reactive to light.  Cardiovascular:     Rate and Rhythm: Normal rate and regular rhythm.     Heart sounds: No murmur heard. Pulmonary:     Effort: Pulmonary effort is normal. No respiratory distress.     Breath sounds: Normal breath sounds.  Abdominal:     General: Abdomen is flat.     Palpations: Abdomen is soft.     Tenderness: There is no abdominal tenderness.  Musculoskeletal:     Comments: Chronic deformity of the left foot.  Left foot edematous and with mild tenderness throughout.  Foot feels warm to the touch compared to the right foot.  No wounds.  Skin:    General: Skin is warm and  dry.  Neurological:     General: No focal deficit present.     Mental Status: She is alert. Mental status is at baseline.  Psychiatric:        Mood and Affect: Mood normal.        Behavior: Behavior normal.     ED Results and Treatments Labs (all labs ordered are listed, but only abnormal results are displayed) Labs Reviewed  CBC WITH DIFFERENTIAL/PLATELET - Abnormal; Notable for the following components:      Result Value   WBC 14.3 (*)    Neutro Abs 9.7 (*)    Monocytes Absolute 1.1 (*)    All other components within normal limits  BASIC METABOLIC PANEL WITH GFR - Abnormal; Notable for the following components:   Glucose, Bld 162 (*)  BUN 21 (*)    All other components within normal limits  SEDIMENTATION RATE - Abnormal; Notable for the following components:   Sed Rate 60 (*)    All other components within normal limits  CULTURE, BLOOD (ROUTINE X 2)  CULTURE, BLOOD (ROUTINE X 2)  C-REACTIVE PROTEIN                                                                                                                          Radiology DG Foot Complete Left Result Date: 02/18/2024 CLINICAL DATA:  Pain. EXAM: LEFT FOOT - COMPLETE 3+ VIEW; LEFT ANKLE COMPLETE - 3+ VIEW COMPARISON:  Foot radiograph 12/29/2023, 08/30/2023 FINDINGS: Foot: Diffusely decreased bone mineralization. Comminuted ununited fracture of the navicular without change from prior exam. Increasing ossific densities adjacent to the dorsal aspect of the talonavicular joint is well as in the lateral mid hindfoot. Downsloping of the talus. Widening of the talonavicular joint with moderate ankle joint effusion. Generalized soft tissue edema. No acute fracture. Ankle: Widening of the talonavicular joint. Ill-defined lucency involving the medial talar dome. Also unchanged. Downsloping of the talus with disruption of the talonavicular joint. No acute fracture. Generalized soft tissue edema. Vascular calcifications are seen.  IMPRESSION: 1. Findings suspicious for Charcot arthropathy of the foot. Chronic ununited navicular fracture. Increasing downsloping of the talus and increasing bony fragmentation about the talonavicular joint. 2. Widening of the talonavicular joint with downsloping of the talus. Ankle joint effusion. 3. Ill-defined lucency involving the medial talar dome, may represent osteochondral lesion. 4. Generalized soft tissue edema. Electronically Signed   By: Chadwick Colonel M.D.   On: 02/18/2024 17:30   DG Ankle Complete Left Result Date: 02/18/2024 CLINICAL DATA:  Pain. EXAM: LEFT FOOT - COMPLETE 3+ VIEW; LEFT ANKLE COMPLETE - 3+ VIEW COMPARISON:  Foot radiograph 12/29/2023, 08/30/2023 FINDINGS: Foot: Diffusely decreased bone mineralization. Comminuted ununited fracture of the navicular without change from prior exam. Increasing ossific densities adjacent to the dorsal aspect of the talonavicular joint is well as in the lateral mid hindfoot. Downsloping of the talus. Widening of the talonavicular joint with moderate ankle joint effusion. Generalized soft tissue edema. No acute fracture. Ankle: Widening of the talonavicular joint. Ill-defined lucency involving the medial talar dome. Also unchanged. Downsloping of the talus with disruption of the talonavicular joint. No acute fracture. Generalized soft tissue edema. Vascular calcifications are seen. IMPRESSION: 1. Findings suspicious for Charcot arthropathy of the foot. Chronic ununited navicular fracture. Increasing downsloping of the talus and increasing bony fragmentation about the talonavicular joint. 2. Widening of the talonavicular joint with downsloping of the talus. Ankle joint effusion. 3. Ill-defined lucency involving the medial talar dome, may represent osteochondral lesion. 4. Generalized soft tissue edema. Electronically Signed   By: Chadwick Colonel M.D.   On: 02/18/2024 17:30    Pertinent labs & imaging results that were available during my care of the  patient were reviewed by me and considered in my medical decision making (see MDM  for details).  Medications Ordered in ED Medications  cefTRIAXone (ROCEPHIN) 2 g in sodium chloride  0.9 % 100 mL IVPB (has no administration in time range)    And  metroNIDAZOLE (FLAGYL) IVPB 500 mg (has no administration in time range)    And  linezolid (ZYVOX) IVPB 600 mg (has no administration in time range)  acetaminophen  (TYLENOL ) tablet 1,000 mg (1,000 mg Oral Given 02/18/24 1822)  gadobutrol  (GADAVIST ) 1 MMOL/ML injection 10 mL (10 mLs Intravenous Contrast Given 02/18/24 1936)                                                                                                                                     Procedures Procedures  (including critical care time)  Medical Decision Making / ED Course   MDM:  53 year old presenting to the emergency department with foot pain.  Patient overall well-appearing, physical examination with swelling and warmth over chronic Charcot foot deformity.  X-ray shows findings of Charcot foot.  Given warmth, concern for possible underlying infection.  Differential includes cellulitis, osteomyelitis, septic joint.  Patient labs with mild leukocytosis.  Given limitation of x-ray will obtain MRI to further evaluate for infection.  If no acute findings likely treat for cellulitis.  Clinical Course as of 02/18/24 2300  Fri Feb 18, 2024  2256 MRI interpretation limited due to complex of the lack of MSK overnight.  Prelim read by radiology shows findings which may represent osteomyelitis with marrow edema.  Discussed with podiatrist Dr. Luster Salters, thinks it would be reasonable to keep patient in the hospital for formal read and antibiotics, without wound he is less concern for osteomyelitis but would recommend that we follow-up final read.  Podiatry does not follow patients at [facility] so could be sent down to [facility] if podiatry input is needed following MRI result.  Discussed  with hospital physician Dr. Sunnie England who will admit patient.  Discussed with patient who is comfortable with the plan. [WS]    Clinical Course User Index [WS] Mordecai Applebaum, MD     Additional history obtained:  -External records from outside source obtained and reviewed including: Chart review including previous notes, labs, imaging, consultation notes including podiatry notes    Lab Tests: -I ordered, reviewed, and interpreted labs.   The pertinent results include:   Labs Reviewed  CBC WITH DIFFERENTIAL/PLATELET - Abnormal; Notable for the following components:      Result Value   WBC 14.3 (*)    Neutro Abs 9.7 (*)    Monocytes Absolute 1.1 (*)    All other components within normal limits  BASIC METABOLIC PANEL WITH GFR - Abnormal; Notable for the following components:   Glucose, Bld 162 (*)    BUN 21 (*)    All other components within normal limits  SEDIMENTATION RATE - Abnormal; Notable for the following components:   Sed Rate 60 (*)    All other components  within normal limits  CULTURE, BLOOD (ROUTINE X 2)  CULTURE, BLOOD (ROUTINE X 2)  C-REACTIVE PROTEIN    Notable for leukocytosis, elevated ESR  Imaging Studies ordered: I ordered imaging studies including MRI foot On my interpretation imaging demonstrates charcot foot, ?fluid collections I independently visualized and interpreted imaging. I agree with the radiologist interpretation   Medicines ordered and prescription drug management: Meds ordered this encounter  Medications   acetaminophen  (TYLENOL ) tablet 1,000 mg   gadobutrol  (GADAVIST ) 1 MMOL/ML injection 10 mL   AND Linked Order Group    cefTRIAXone (ROCEPHIN) 2 g in sodium chloride  0.9 % 100 mL IVPB     Antibiotic Indication::   Other Indication (list below)     Other Indication::   Wound infection    metroNIDAZOLE (FLAGYL) IVPB 500 mg     Antibiotic Indication::   Other Indication (list below)     Other Indication::   Wound infection    linezolid  (ZYVOX) IVPB 600 mg     Antibiotic Indication::   Wound Infection    -I have reviewed the patients home medicines and have made adjustments as needed   Consultations Obtained: I requested consultation with the podiatrist,  and discussed lab and imaging findings as well as pertinent plan - they recommend: admission    Social Determinants of Health:  Diagnosis or treatment significantly limited by social determinants of health: obesity   Reevaluation: After the interventions noted above, I reevaluated the patient and found that their symptoms have improved  Co morbidities that complicate the patient evaluation  Past Medical History:  Diagnosis Date   Anemia    Anxiety    Arthritis    Right knee   CKD (chronic kidney disease) stage 2, GFR 60-89 ml/min    Dr. 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    Final Clinical Impression(s) / ED Diagnoses Final diagnoses:  Diabetic foot infection (HCC)     This chart was dictated using voice recognition software.  Despite best efforts to proofread,  errors can occur which can change the documentation meaning.    Mordecai Applebaum, MD 02/18/24 2300

## 2024-02-19 DIAGNOSIS — E1169 Type 2 diabetes mellitus with other specified complication: Secondary | ICD-10-CM | POA: Diagnosis not present

## 2024-02-19 DIAGNOSIS — E785 Hyperlipidemia, unspecified: Secondary | ICD-10-CM

## 2024-02-19 DIAGNOSIS — E66813 Obesity, class 3: Secondary | ICD-10-CM | POA: Diagnosis not present

## 2024-02-19 DIAGNOSIS — I10 Essential (primary) hypertension: Secondary | ICD-10-CM

## 2024-02-19 DIAGNOSIS — L03116 Cellulitis of left lower limb: Secondary | ICD-10-CM

## 2024-02-19 DIAGNOSIS — I82502 Chronic embolism and thrombosis of unspecified deep veins of left lower extremity: Secondary | ICD-10-CM

## 2024-02-19 LAB — BASIC METABOLIC PANEL WITH GFR
Anion gap: 10 (ref 5–15)
BUN: 19 mg/dL (ref 6–20)
CO2: 24 mmol/L (ref 22–32)
Calcium: 8.9 mg/dL (ref 8.9–10.3)
Chloride: 102 mmol/L (ref 98–111)
Creatinine, Ser: 0.85 mg/dL (ref 0.44–1.00)
GFR, Estimated: >60 mL/min (ref >60)
Glucose, Bld: 292 mg/dL — ABNORMAL HIGH (ref 70–99)
Potassium: 4 mmol/L (ref 3.5–5.1)
Sodium: 136 mmol/L (ref 135–145)

## 2024-02-19 LAB — C-REACTIVE PROTEIN: CRP: 5.1 mg/dL — ABNORMAL HIGH (ref <1.0)

## 2024-02-19 LAB — CBC
HCT: 35 % — ABNORMAL LOW (ref 36.0–46.0)
Hemoglobin: 10.7 g/dL — ABNORMAL LOW (ref 12.0–15.0)
MCH: 27.1 pg (ref 26.0–34.0)
MCHC: 30.6 g/dL (ref 30.0–36.0)
MCV: 88.6 fL (ref 80.0–100.0)
Platelets: 185 10*3/uL (ref 150–400)
RBC: 3.95 MIL/uL (ref 3.87–5.11)
RDW: 14.8 % (ref 11.5–15.5)
WBC: 10.4 10*3/uL (ref 4.0–10.5)
nRBC: 0 % (ref 0.0–0.2)

## 2024-02-19 LAB — GLUCOSE, CAPILLARY
Glucose-Capillary: 201 mg/dL — ABNORMAL HIGH (ref 70–99)
Glucose-Capillary: 178 mg/dL — ABNORMAL HIGH (ref 70–99)
Glucose-Capillary: 330 mg/dL — ABNORMAL HIGH (ref 70–99)
Glucose-Capillary: 188 mg/dL — ABNORMAL HIGH (ref 70–99)

## 2024-02-19 LAB — HEMOGLOBIN A1C
Hgb A1c MFr Bld: 9.5 % — ABNORMAL HIGH (ref 4.8–5.6)
Mean Plasma Glucose: 225.95 mg/dL

## 2024-02-19 LAB — HIV ANTIBODY (ROUTINE TESTING W REFLEX): HIV Screen 4th Generation wRfx: NONREACTIVE

## 2024-02-19 LAB — SEDIMENTATION RATE: Sed Rate: 55 mm/h — ABNORMAL HIGH (ref 0–22)

## 2024-02-19 MED ORDER — METRONIDAZOLE 500 MG/100ML IV SOLN: 500.0000 mg | Freq: Three times a day (TID) | INTRAVENOUS | Status: DC

## 2024-02-19 MED ORDER — INSULIN GLARGINE-YFGN 100 UNIT/ML ~~LOC~~ SOLN
12.0000 [IU] | Freq: Every day | SUBCUTANEOUS | Status: DC
Start: 2024-02-19 — End: 2024-02-19

## 2024-02-19 MED ORDER — SODIUM CHLORIDE 0.9 % IV SOLN
2.0000 g | INTRAVENOUS | Status: DC
  Administered 2024-02-19 – 2024-02-20 (×2): 2 g via INTRAVENOUS
  Filled 2024-02-19 (×2): qty 20

## 2024-02-19 MED ORDER — LINEZOLID 600 MG/300ML IV SOLN
600.0000 mg | Freq: Two times a day (BID) | INTRAVENOUS | Status: DC
Start: 2024-02-19 — End: 2024-02-21
  Administered 2024-02-19 – 2024-02-21 (×4): 600 mg via INTRAVENOUS
  Filled 2024-02-19 (×7): qty 300

## 2024-02-19 MED ORDER — LINEZOLID 600 MG/300ML IV SOLN: 600.0000 mg | Freq: Once | INTRAVENOUS | Status: DC

## 2024-02-19 MED ORDER — METRONIDAZOLE 500 MG/100ML IV SOLN
500.0000 mg | Freq: Three times a day (TID) | INTRAVENOUS | Status: DC
Start: 2024-02-19 — End: 2024-02-21
  Administered 2024-02-19 – 2024-02-21 (×6): 500 mg via INTRAVENOUS
  Filled 2024-02-19 (×6): qty 100

## 2024-02-19 MED ORDER — INSULIN ASPART 100 UNIT/ML IJ SOLN
0.0000 [IU] | Freq: Three times a day (TID) | INTRAMUSCULAR | Status: DC
  Administered 2024-02-19: 3 [IU] via SUBCUTANEOUS
  Administered 2024-02-19: 5 [IU] via SUBCUTANEOUS
  Administered 2024-02-19: 11 [IU] via SUBCUTANEOUS
  Administered 2024-02-20: 3 [IU] via SUBCUTANEOUS
  Administered 2024-02-20: 5 [IU] via SUBCUTANEOUS
  Administered 2024-02-20 – 2024-02-21 (×2): 3 [IU] via SUBCUTANEOUS
  Administered 2024-02-21: 2 [IU] via SUBCUTANEOUS

## 2024-02-19 MED ORDER — SODIUM CHLORIDE 0.9 % IV SOLN: 2.0000 g | Freq: Once | INTRAVENOUS | Status: DC

## 2024-02-19 MED ORDER — DULOXETINE HCL 60 MG PO CPEP
60.0000 mg | ORAL_CAPSULE | Freq: Two times a day (BID) | ORAL | Status: DC
  Administered 2024-02-19 – 2024-02-21 (×6): 60 mg via ORAL
  Filled 2024-02-19 (×6): qty 1

## 2024-02-19 MED ORDER — INSULIN GLARGINE-YFGN 100 UNIT/ML ~~LOC~~ SOLN
14.0000 [IU] | Freq: Every day | SUBCUTANEOUS | Status: DC
  Filled 2024-02-19: qty 0.14

## 2024-02-19 MED ORDER — SODIUM CHLORIDE 0.9 % IV SOLN: 1.0000 g | INTRAVENOUS | Status: DC

## 2024-02-19 MED ORDER — INSULIN GLARGINE-YFGN 100 UNIT/ML ~~LOC~~ SOLN
15.0000 [IU] | Freq: Every day | SUBCUTANEOUS | Status: DC
  Administered 2024-02-19 – 2024-02-20 (×2): 15 [IU] via SUBCUTANEOUS
  Filled 2024-02-19 (×3): qty 0.15

## 2024-02-19 MED ORDER — OXYCODONE HCL 5 MG PO TABS
5.0000 mg | ORAL_TABLET | ORAL | Status: DC | PRN
  Administered 2024-02-21: 5 mg via ORAL
  Filled 2024-02-19: qty 1

## 2024-02-19 MED ORDER — INSULIN GLARGINE-YFGN 100 UNIT/ML ~~LOC~~ SOLN
10.0000 [IU] | Freq: Every day | SUBCUTANEOUS | Status: DC
  Filled 2024-02-19: qty 0.1

## 2024-02-19 NOTE — Assessment & Plan Note (Signed)
 Calculated BMI is 43.3

## 2024-02-19 NOTE — Progress Notes (Addendum)
        MRI Left foot: IMPRESSION: 1. Markedly abnormal appearance of the hindfoot and midfoot with extensive osseous fragmentation which may be related to neuropathic/Charcot arthropathy. Multifocal areas of bony erosion and/or resorption may reflect a superimposed osteomyelitis. Correlate with serum inflammatory markers. 2. Large complex joint effusion at the midfoot communicating with the tibiotalar, subtalar, and midfoot articulations. Findings are concerning for a superimposed septic arthritis. 3. Rim-enhancing fluid collection within the deep soft tissues of the posterior lower leg which appears partially located in the flexor hallucis longus muscle measuring approximately 3.3 x 1.6 x 1.7 cm. This is concerning for abscess. 4. Enhancing tenosynovitis of the peroneal tendons. 5. Diffuse edema-like signal of the foot musculature which may represent changes related to denervation and/or myositis. 6. Additional sites of focal bone marrow edema within the proximal diaphyses of the third and fourth metatarsals, favoring stress-related changes.    Called and consulted orthopedic on call Dr. Charol Copas  The above findings of MRI, with the patient case presentation was discussed in detail Findings are not consistent with osteomyelitis or septic arthritis at this point.  He will reach out to the foot and ankle specialist-and help us  in this case accordingly.  Appreciate all input,  and the consultation from ortho team.   Will continue current care.  With IV antibiotics, IVF for now.  Consulting ID     SIGNED: Bobbetta Burnet, MD, FHM. FAAFP [facility],  Pager (please use [email or web address] to page/text)  Please use Epic Secure Chat for non-urgent communication (7AM-7PM) If 7PM-7AM, please contact night-coverage [email or web address],  02/19/2024, 4:45 PM

## 2024-02-19 NOTE — Assessment & Plan Note (Signed)
Continue anticoagulation with apixaban.  ?

## 2024-02-19 NOTE — Progress Notes (Signed)
 PROGRESS NOTE    Patient: Alicia Coffey                            PCP: Bari Boos, FNP                    DOB: 07/26/1971            DOA: 02/18/2024 [id number]             DOS: 02/19/2024, 7:55 AM   LOS: 1 day   Date of Service: The patient was seen and examined on 02/19/2024  Subjective:   The patient was seen and examined this morning. Hemodynamically stable. No issues overnight .  Brief Narrative:   Alicia Coffey is a 53 y.o. female with medical history significant of left Charcot foot, CKD, arthritis, hyperlipidemia, T2DM, and obesity class 3 who presented with left foot pain.  Reported 2 weeks of left foot pain, associated with local edema, erythema and hot to the touch. Denies any fevers or chills, no nausea or vomiting.  No recent trauma to her left foot, and no local wounds.    She was evaluated in the ED on 03/25, she was diagnosed with charcot foot and a cam walker was prescribed. She was referred to podiatry.      Assessment & Plan:   Principal Problem:   Cellulitis of left ankle Active Problems:   Essential hypertension   Type 2 diabetes mellitus with hyperlipidemia (HCC)   Obesity, class 3   Chronic deep vein thrombosis (DVT) of left lower extremity (HCC) * Cellulitis of left ankle - Mild improvement, continue IV antibiotics Rocephin - Continue pain control with oral and IV analgesics - Pending MRI of the foot-to rule out osteomyelitis - Hemodynamically stable, afebrile, normotensive, improved leukocytosis   Essential hypertension  -stable continue home medication metoprolol , hydralazine,, irbersartan    Type 2 diabetes mellitus with hyperlipidemia (HCC) -Checking CBG q. ACHS, SSI coverage CBG - 201     Obesity, class 3 - Discussed regarding outpatient follow-up weight loss, increase exercise, Body mass index is 43.44 kg/m.    Chronic deep vein thrombosis (DVT) of left lower extremity (HCC) Continue anticoagulation with  apixaban     ------------------------------------------------------------------------------------------------------------------------ Nutritional status:  The patient's BMI is: Body mass index is 43.44 kg/m. I agree with the assessment and plan as outlined Skin Assessment: I have examined the patient's skin and I agree with the wound assessment as performed by wound care team As outlined belowe:    ------------------------------------------------------------------------------------------------------------------------------------------------  DVT prophylaxis:  SCDs Start: 02/18/24 2357 apixaban  (ELIQUIS ) tablet 5 mg   Code Status:   Code Status: Full Code  Family Communication: No family member present at bedside-  -Advance care planning has been discussed.   Admission status:   Status is: Inpatient Remains inpatient appropriate because: Needing IV antibiotics, further evaluation to rule out osteomyelitis   Disposition: From  - home             Planning for discharge in 1-2 days: to Home   Procedures:   No admission procedures for hospital encounter.   Antimicrobials:  Anti-infectives (From admission, onward)    Start     Dose/Rate Route Frequency Ordered Stop   02/19/24 2300  cefTRIAXone (ROCEPHIN) 1 g in sodium chloride  0.9 % 100 mL IVPB        1 g 200 mL/hr over 30 Minutes Intravenous Every 24 hours 02/19/24 0354  02/18/24 2300  cefTRIAXone (ROCEPHIN) 2 g in sodium chloride  0.9 % 100 mL IVPB       Placed in "And" Linked Group   2 g 200 mL/hr over 30 Minutes Intravenous Once 02/18/24 2247 02/18/24 2342   02/18/24 2300  metroNIDAZOLE (FLAGYL) IVPB 500 mg       Placed in "And" Linked Group   500 mg 100 mL/hr over 60 Minutes Intravenous  Once 02/18/24 2247 02/19/24 0030   02/18/24 2300  linezolid (ZYVOX) IVPB 600 mg       Placed in "And" Linked Group   600 mg 300 mL/hr over 60 Minutes Intravenous  Once 02/18/24 2247 02/19/24 0153        Medication:    apixaban   5 mg Oral BID   aspirin EC  81 mg Oral QHS   DULoxetine   60 mg Oral BID   hydrALAZINE  100 mg Oral BID   hydrOXYzine  25 mg Oral QHS   insulin  aspart  0-15 Units Subcutaneous TID WC   insulin  glargine-yfgn  10 Units Subcutaneous Q2200   irbesartan   150 mg Oral Daily   metoprolol  tartrate  25 mg Oral BID   rosuvastatin  40 mg Oral QHS    acetaminophen  **OR** acetaminophen , ondansetron  **OR** ondansetron  (ZOFRAN ) IV, oxyCODONE    Objective:   Vitals:   02/18/24 2249 02/18/24 2356 02/19/24 0055 02/19/24 0457  BP: 134/71 134/72 134/72 115/74  Pulse: 82 87 87 71  Resp:  19  18  Temp: 98.2 F (36.8 C)   98.8 F (37.1 C)  TempSrc: Oral   Oral  SpO2: 98% 96%  92%  Weight:      Height:       No intake or output data in the 24 hours ending 02/19/24 0755 Filed Weights   02/18/24 1601  Weight: 113 kg     Physical examination:   General:  AAO x 3,  cooperative, no distress;   HEENT:  Normocephalic, PERRL, otherwise with in Normal limits   Neuro:  CNII-XII intact. , normal motor and sensation, reflexes intact   Lungs:   Clear to auscultation BL, Respirations unlabored,  No wheezes / crackles  Cardio:    S1/S2, RRR, No murmure, No Rubs or Gallops   Abdomen:  Soft, non-tender, bowel sounds active all four quadrants, no guarding or peritoneal signs.  Muscular  skeletal:  Left ankle/foot positive edema +2, limited range of motion due to pain, Mild erythema-improved Limited exam -global generalized weaknesses - in bed, able to move all 4 extremities,   2+ pulses,  symmetric, No pitting edema  Skin:  Dry, warm to touch, negative for any Rashes, negative any open wound or drainage  Wounds: Please see nursing documentation     Left foot/ankle    -------------------------------------------------------------------------------------------------------------------------------    LABs:     Latest Ref Rng & Units 02/19/2024    3:16 AM 02/18/2024    6:35 PM 12/29/2023    4:17  PM  CBC  WBC 4.0 - 10.5 K/uL 10.4  14.3  11.7   Hemoglobin 12.0 - 15.0 g/dL 42.5  95.6  38.7   Hematocrit 36.0 - 46.0 % 35.0  39.0  38.6   Platelets 150 - 400 K/uL 185  229  201       Latest Ref Rng & Units 02/19/2024    3:16 AM 02/18/2024    6:35 PM 12/29/2023    4:17 PM  CMP  Glucose 70 - 99 mg/dL 564  332  951  BUN 6 - 20 mg/dL 19  21  20    Creatinine 0.44 - 1.00 mg/dL 1.32  4.40  1.02   Sodium 135 - 145 mmol/L 136  135  138   Potassium 3.5 - 5.1 mmol/L 4.0  3.8  4.1   Chloride 98 - 111 mmol/L 102  99  101   CO2 22 - 32 mmol/L 24  25  25    Calcium 8.9 - 10.3 mg/dL 8.9  9.5  9.4   Total Protein 6.5 - 8.1 g/dL   7.6   Total Bilirubin 0.0 - 1.2 mg/dL   0.6   Alkaline Phos 38 - 126 U/L   81   AST 15 - 41 U/L   24   ALT 0 - 44 U/L   20        Micro Results Recent Results (from the past 240 hours)  Blood Cultures x 2 sites     Status: None (Preliminary result)   Collection Time: 02/18/24 10:55 PM   Specimen: Left Antecubital; Blood  Result Value Ref Range Status   Specimen Description LEFT ANTECUBITAL  Final   Special Requests   Final    BOTTLES DRAWN AEROBIC AND ANAEROBIC Blood Culture adequate volume Performed at [facility], [address] [place], [place] [address]    Culture PENDING  Incomplete   Report Status PENDING  Incomplete  Blood Cultures x 2 sites     Status: None (Preliminary result)   Collection Time: 02/18/24 11:08 PM   Specimen: BLOOD LEFT HAND  Result Value Ref Range Status   Specimen Description BLOOD LEFT HAND  Final   Special Requests   Final    BOTTLES DRAWN AEROBIC AND ANAEROBIC Blood Culture adequate volume Performed at [facility], [address] [place], [place] [address]    Culture PENDING  Incomplete   Report Status PENDING  Incomplete    Radiology Reports DG Foot Complete Left Result Date: 02/18/2024 CLINICAL DATA:  Pain. EXAM: LEFT FOOT - COMPLETE 3+ VIEW; LEFT ANKLE COMPLETE - 3+ VIEW COMPARISON:  Foot radiograph 12/29/2023,  08/30/2023 FINDINGS: Foot: Diffusely decreased bone mineralization. Comminuted ununited fracture of the navicular without change from prior exam. Increasing ossific densities adjacent to the dorsal aspect of the talonavicular joint is well as in the lateral mid hindfoot. Downsloping of the talus. Widening of the talonavicular joint with moderate ankle joint effusion. Generalized soft tissue edema. No acute fracture. Ankle: Widening of the talonavicular joint. Ill-defined lucency involving the medial talar dome. Also unchanged. Downsloping of the talus with disruption of the talonavicular joint. No acute fracture. Generalized soft tissue edema. Vascular calcifications are seen. IMPRESSION: 1. Findings suspicious for Charcot arthropathy of the foot. Chronic ununited navicular fracture. Increasing downsloping of the talus and increasing bony fragmentation about the talonavicular joint. 2. Widening of the talonavicular joint with downsloping of the talus. Ankle joint effusion. 3. Ill-defined lucency involving the medial talar dome, may represent osteochondral lesion. 4. Generalized soft tissue edema. Electronically Signed   By: Chadwick Colonel M.D.   On: 02/18/2024 17:30   DG Ankle Complete Left Result Date: 02/18/2024 CLINICAL DATA:  Pain. EXAM: LEFT FOOT - COMPLETE 3+ VIEW; LEFT ANKLE COMPLETE - 3+ VIEW COMPARISON:  Foot radiograph 12/29/2023, 08/30/2023 FINDINGS: Foot: Diffusely decreased bone mineralization. Comminuted ununited fracture of the navicular without change from prior exam. Increasing ossific densities adjacent to the dorsal aspect of the talonavicular joint is well as in the lateral mid hindfoot. Downsloping of the talus. Widening of the  talonavicular joint with moderate ankle joint effusion. Generalized soft tissue edema. No acute fracture. Ankle: Widening of the talonavicular joint. Ill-defined lucency involving the medial talar dome. Also unchanged. Downsloping of the talus with disruption of the  talonavicular joint. No acute fracture. Generalized soft tissue edema. Vascular calcifications are seen. IMPRESSION: 1. Findings suspicious for Charcot arthropathy of the foot. Chronic ununited navicular fracture. Increasing downsloping of the talus and increasing bony fragmentation about the talonavicular joint. 2. Widening of the talonavicular joint with downsloping of the talus. Ankle joint effusion. 3. Ill-defined lucency involving the medial talar dome, may represent osteochondral lesion. 4. Generalized soft tissue edema. Electronically Signed   By: Chadwick Colonel M.D.   On: 02/18/2024 17:30    SIGNED: Bobbetta Burnet, MD, FHM. FAAFP. Arlin Benes - [facility] Time spent - 35 min.  In seeing, evaluating and examining the patient. Reviewing medical records, labs, drawn plan of care. [facility],  Pager (please use [email or web address] to page/ text) Please use Epic Secure Chat for non-urgent communication (7AM-7PM)  If 7PM-7AM, please contact night-coverage [email or web address], 02/19/2024, 7:55 AM

## 2024-02-19 NOTE — Hospital Course (Addendum)
 Alicia Coffey is a 53 y.o. female with medical history significant of left Charcot foot, CKD, arthritis, hyperlipidemia, T2DM, and obesity class 3 who presented with left foot pain.  Reported 2 weeks of left foot pain, associated with local edema, erythema and hot to the touch. Denies any fevers or chills, no nausea or vomiting.  No recent trauma to her left foot, and no local wounds.    She was evaluated in the ED on 03/25, she was diagnosed with charcot foot and a cam walker was prescribed. She was referred to podiatry.      Assessment & Plan:   Principal Problem:   Cellulitis of left ankle Active Problems:   Essential hypertension   Type 2 diabetes mellitus with hyperlipidemia (HCC)   Obesity, class 3   Chronic deep vein thrombosis (DVT) of left lower extremity (HCC) * Cellulitis of left ankle - Mild improvement, continue IV antibiotics Rocephin - Continue pain control with oral and IV analgesics - Pending MRI of the foot-to rule out osteomyelitis - Hemodynamically stable, afebrile, normotensive, improved leukocytosis   Essential hypertension  -stable continue home medication metoprolol , hydralazine,, irbersartan    Type 2 diabetes mellitus with hyperlipidemia (HCC) -Checking CBG q. ACHS, SSI coverage CBG - 201     Obesity, class 3 - Discussed regarding outpatient follow-up weight loss, increase exercise, Body mass index is 43.44 kg/m.    Chronic deep vein thrombosis (DVT) of left lower extremity (HCC) Continue anticoagulation with apixaban 

## 2024-02-19 NOTE — Progress Notes (Signed)
   02/19/24 1128  TOC Brief Assessment  Insurance and Status Reviewed  Patient has primary care physician Yes  Home environment has been reviewed Single Family Home  Prior level of function: Independent  Prior/Current Home Services No current home services  Social Drivers of Health Review SDOH reviewed no interventions necessary  Readmission risk has been reviewed Yes  Transition of care needs no transition of care needs at this time   Transition of Care Department [facility]) has reviewed patient, and no TOC needs have been identified at this time. We will continue to monitor patient advancement through interdisciplinary progression rounds. If new patient transition needs arise, please place a TOC consult.

## 2024-02-19 NOTE — Assessment & Plan Note (Addendum)
 Continue blood pressure control with metoprolol , hydralazine, irbersartan

## 2024-02-19 NOTE — H&P (Addendum)
 History and Physical    Patient: Alicia Coffey EAV:[id number] DOB: Jan 20, 1971 DOA: 02/18/2024 DOS: the patient was seen and examined on 02/19/2024 PCP: Bari Boos, FNP  Patient coming from: Home  Chief Complaint:  Chief Complaint  Patient presents with   Foot Pain   HPI: Alicia Coffey is a 53 y.o. female with medical history significant of left Charcot foot, CKD, arthritis, hyperlipidemia, T2DM, and obesity class 3 who presented with left foot pain.  Reported 2 weeks of left foot pain, associated with local edema, erythema and hot to the touch. Denies any fevers or chills, no nausea or vomiting.  No recent trauma to her left foot, and no local wounds.   She was evaluated in the ED on 03/25, she was diagnosed with charcot foot and a cam walker was prescribed. She was referred to podiatry.    Review of Systems: As mentioned in the history of present illness. All other systems reviewed and are negative. Past Medical History:  Diagnosis Date   Anemia    Anxiety    Arthritis    Right knee   CKD (chronic kidney disease) stage 2, GFR 60-89 ml/min    Dr. 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   Morbid obesity with BMI of 40.0-44.9, adult (HCC)    Obstructive sleep apnea of adult 2017   Oropharyngeal dysphagia    Peripheral neuropathy    Proteinuria    Secondary hyperparathyroidism (HCC)    Shortness of breath on exertion    Type 2 diabetes mellitus (HCC)    Uterine fibroid    Vertigo 2015   Vitamin D deficiency    Wears dentures    Past Surgical History:  Procedure Laterality Date   ABLATION     uterine    BREAST BIOPSY Left 2014   benign   BREAST BIOPSY Right 2013   high risk    BREAST BIOPSY Left 2021   benign   BREAST BIOPSY Left 2021   benign   BREAST BIOPSY Right 07/22/2023   US  RT BREAST BX W LOC DEV 1ST LESION IMG BX SPEC US  GUIDE 07/22/2023 [facility]   BREAST EXCISIONAL BIOPSY Left    BREAST LUMPECTOMY Left 2021   LCIS   BREAST LUMPECTOMY WITH NEEDLE LOCALIZATION  08/15/2012   Procedure: BREAST LUMPECTOMY WITH NEEDLE LOCALIZATION;  Surgeon: Darcella Earnest, MD;  Location: [facility];  Service: General;  Laterality: Right;  Wire localizations Right breast calcifications   BREAST LUMPECTOMY WITH RADIOACTIVE SEED LOCALIZATION Left 01/30/2020   Procedure: LEFT BREAST LUMPECTOMY X 2 WITH RADIOACTIVE SEED LOCALIZATION;  Surgeon: Oza Blumenthal, MD;  Location: [facility];  Service: General;  Laterality: Left;   CARPAL TUNNEL RELEASE Right 11/24/2017   Procedure: RIGHT CARPAL TUNNEL RELEASE;  Surgeon: Audie Bleacher, MD;  Location: [facility];  Service: Neurosurgery;  Laterality: Right;  Right CARPAL TUNNEL RELEASE   CARPAL TUNNEL RELEASE Left 08/31/2018   Procedure: LEFT CARPAL TUNNEL RELEASE;  Surgeon: Audie Bleacher, MD;  Location: [facility];  Service: Neurosurgery;  Laterality: Left;   DILITATION & CURRETTAGE/HYSTROSCOPY WITH THERMACHOICE ABLATION N/A 08/21/2014   Procedure:  DILATATION & CURETTAGE/HYSTEROSCOPY WITH THERMACHOICE ABLATION;  Surgeon: Albino Hum, MD;  Location: [facility];  Service: Gynecology;  Laterality: N/A;   KNEE ARTHROSCOPY Left 10/16/2015   Procedure: ARTHROSCOPY KNEE with debridment;  Surgeon: Wendolyn Hamburger, MD;  Location: [facility];  Service: Orthopedics;  Laterality: Left;   KNEE ARTHROSCOPY WITH MEDIAL MENISECTOMY Right 07/17/2015   Procedure: KNEE ARTHROSCOPY WITH MEDIAL MENISECTOMY;  Surgeon: Wendolyn Hamburger, MD;  Location: [facility];  Service: Orthopedics;  Laterality: Right;   POLYPECTOMY N/A 08/21/2014   Procedure: ENDOMETRIAL POLYPECTOMY;  Surgeon: Albino Hum, MD;   Location: [facility];  Service: Gynecology;  Laterality: N/A;   REFRACTIVE SURGERY Right    micro aneuysms   TRIGGER FINGER RELEASE Left    ULNAR NERVE TRANSPOSITION Right 11/24/2017   Procedure: RIGHT ULNAR NERVE DECOMPRESSION/TRANSPOSITION;  Surgeon: Audie Bleacher, MD;  Location: [facility];  Service: Neurosurgery;  Laterality: Right;  Right ULNAR NERVE DECOMPRESSION/TRANSPOSITION   Social History:  reports that she has never smoked. She has never been exposed to tobacco smoke. She has never used smokeless tobacco. She reports that she does not drink alcohol and does not use drugs.  Allergies  Allergen Reactions   Penicillins Other (See Comments)    PATIENT HAS HAD A PCN REACTION WITH IMMEDIATE RASH, FACIAL/TONGUE/THROAT SWELLING, SOB, OR LIGHTHEADEDNESS WITH HYPOTENSION:  #  #  YES  #  #  Has patient had a PCN reaction causing severe rash involving mucus membranes or skin necrosis: No Has patient had a PCN reaction that required hospitalization No Has patient had a PCN reaction occurring within the last 10 years: No    Latex    Other Hives, Itching and Rash    Powder in gloves Arthropod Insect    Family History  Problem Relation Age of Onset   Diabetes Mother    Heart disease Mother    Hypertension Mother    Diabetes Father    Heart disease Father    Hypertension Father    Heart attack Father    Diabetes Sister    Fibromyalgia Sister    Diabetes Sister    Diabetes Brother    Heart attack Maternal Grandmother        <35   Heart attack Maternal Grandfather    Breast cancer Paternal Grandmother 80       breast; dbl mastectomy   Heart attack Paternal Grandfather    Colon polyps Neg Hx    Colon cancer Neg Hx    Crohn's disease Neg Hx    Esophageal cancer Neg Hx    Rectal cancer Neg Hx    Stomach cancer Neg Hx     Prior to Admission medications   Medication Sig Start Date End Date Taking? Authorizing Provider  acetaminophen  (TYLENOL ) 500 MG tablet Take 1,000 mg by mouth every  6 (six) hours as needed for mild pain or headache.    [provider]  albuterol (PROVENTIL HFA;VENTOLIN HFA) 108 (90 Base) MCG/ACT inhaler Inhale 2 puffs into the lungs every 6 (six) hours as needed for wheezing or shortness of breath.    [provider]  apixaban  (ELIQUIS ) 5 MG TABS tablet Take 1 tablet (5 mg total) by mouth 2 (two) times daily. 10/08/23   Gudena, Vinay, MD  aspirin EC 81 MG tablet Take 81 mg by mouth at bedtime. Swallow whole.    [provider]  bumetanide (BUMEX) 0.5 MG tablet Take 0.5 mg by mouth 2 (two) times daily.  [provider]  ciclopirox (LOPROX) 0.77 % cream Apply 1 application topically 2 (two) times daily as needed (itching).    [provider]  clobetasol ointment (TEMOVATE) 0.05 % Apply 1 application  topically 2 (two) times daily as needed. 04/22/20   [provider]  Docusate Sodium (DSS) 100 MG CAPS Take 1 capsule by mouth 2 (two) times daily.    [provider]  Dulaglutide 4.5 MG/0.5ML SOPN  05/04/22   [provider]  DULoxetine  (CYMBALTA ) 60 MG capsule Take 1 capsule (60 mg total) by mouth daily. Patient taking differently: Take 60 mg by mouth 2 (two) times daily. 12/14/13   Phebe Brasil, MD  EASY COMFORT PEN NEEDLES 31G X 8 MM MISC  08/13/20   [provider]  Empagliflozin-metFORMIN HCl ER (SYNJARDY XR) 25-1000 MG TB24 Take 1 tablet by mouth at bedtime.    [provider]  Finerenone (KERENDIA) 10 MG TABS Take 10 mg by mouth as directed. On Monday, Wednesday, & Friday 06/12/21   [provider]  hydrALAZINE (APRESOLINE) 25 MG tablet Take 100 mg by mouth 2 (two) times daily. 12/03/21   [provider]  hydrocortisone  cream 1 % Apply to affected area 2 times daily Patient taking differently: as needed for itching. Apply to affected area 2 times daily 08/12/15   Rose, Kayla, PA-C  hydrOXYzine (ATARAX/VISTARIL) 25 MG tablet Take 25 mg by mouth at bedtime.     [provider]  insulin  glargine (LANTUS ) 100 UNIT/ML injection Inject 40 Units into the skin at bedtime.    [provider]  irbesartan  (AVAPRO ) 150 MG tablet TAKE 1 TABLET BY MOUTH EVERY DAY 08/27/23   Wert, Michael B, MD  meloxicam  (MOBIC ) 15 MG tablet Take 15 mg by mouth daily as needed for pain.    [provider]  methocarbamol (ROBAXIN) 500 MG tablet Take 500 mg by mouth 2 (two) times daily.    [provider]  metoprolol  tartrate (LOPRESSOR ) 25 MG tablet Take 25 mg by mouth 2 (two) times daily.    [provider]  NOVOLOG FLEXPEN 100 UNIT/ML FlexPen Inject into the skin. Sliding scale 07/17/20   [provider]  Polysaccharide Iron Complex (POLY-IRON 150 PO) Take 1 tablet by mouth 2 (two) times daily.    [provider]  rosuvastatin (CRESTOR) 40 MG tablet Take 40 mg by mouth at bedtime.     [provider]  traMADol (ULTRAM) 50 MG tablet Take 50 mg by mouth daily.    [provider]    Physical Exam: Vitals:   02/18/24 2131 02/18/24 2200 02/18/24 2249 02/18/24 2356  BP: (!) 149/82  134/71 134/72  Pulse: 86  82 87  Resp: 17   19  Temp: 98.2 F (36.8 C) 97.7 F (36.5 C) 98.2 F (36.8 C)   TempSrc: Oral Oral Oral   SpO2: 98%  98% 96%  Weight:      Height:       Neurology awake and alert ENT with mild pallor Cardiovascular with S1 and S2 present and regular with no gallops, rubs or murmurs Respiratory with no rales or wheezing, no rhonchi  Abdomen with no distention and non tender Left ankle with local non pitting edema, tender to palpation and increased local temperature, there is no erythema or open wounds.  Right ankle with no edema, or tenderness.          Data Reviewed:   Na 135, K 3,8 Cl 99, bicarbonate 25  glucose 162 bun 21 cr 0,99 Wbc 14.3 hgb 12.4 plt 229   Left ankle and foot radiograph with Charcot arthropathy of the foot. Chronic ununited navicular fracture. Increasing  down-sloping of the talus and increasing bony fragmentation about the talonavicular joint.  Widening of the talonavicular joint with down-sloping of the talus. Ankle joint effusion.  Ill defined lucency involving the medial talar dome, may represent osteochondral lesion.  Generalized tissue edema.   Assessment and Plan: * Cellulitis of left ankle Plan to continue antibiotic therapy with ceftriaxone.  Continue pain control with oral oxycodone .  Follow up with final report on foot MRI.   Essential hypertension Continue blood pressure control with metoprolol , hydralazine, irbersartan   Type 2 diabetes mellitus with hyperlipidemia (HCC) Continue insulin  sliding scale for glucose cover and monitoring  Basal insulin , will use lowe dose than home to avoid hypoglycemia.  Continue with statin therapy   Obesity, class 3 Calculated BMI is 43.3   Chronic deep vein thrombosis (DVT) of left lower extremity (HCC) Continue anticoagulation with apixaban     Advance Care Planning:   Code Status: Full Code   Consults: none   Family Communication: no family at the bedside   Severity of Illness: The appropriate patient status for this patient is INPATIENT. Inpatient status is judged to be reasonable and necessary in order to provide the required intensity of service to ensure the patient's safety. The patient's presenting symptoms, physical exam findings, and initial radiographic and laboratory data in the context of their chronic comorbidities is felt to place them at high risk for further clinical deterioration. Furthermore, it is not anticipated that the patient will be medically stable for discharge from the hospital within 2 midnights of admission.   * I certify that at the point of admission it is my clinical judgment that the patient will require inpatient hospital care spanning beyond 2 midnights from the point of admission due to high intensity of service, high risk for further deterioration  and high frequency of surveillance required.*  Author: Albertus Alt, MD 02/19/2024 12:07 AM  For on call review [email or web address].

## 2024-02-19 NOTE — Assessment & Plan Note (Signed)
 Continue insulin  sliding scale for glucose cover and monitoring  Basal insulin , will use lowe dose than home to avoid hypoglycemia.  Continue with statin therapy

## 2024-02-19 NOTE — Plan of Care (Signed)
  Problem: Clinical Measurements: Goal: Ability to maintain clinical measurements within normal limits will improve Outcome: Progressing   Problem: Clinical Measurements: Goal: Diagnostic test results will improve Outcome: Progressing   Problem: Nutrition: Goal: Adequate nutrition will be maintained Outcome: Progressing   Problem: Activity: Goal: Risk for activity intolerance will decrease Outcome: Progressing   Problem: Coping: Goal: Level of anxiety will decrease Outcome: Progressing

## 2024-02-19 NOTE — Assessment & Plan Note (Signed)
 Plan to continue antibiotic therapy with ceftriaxone.  Continue pain control with oral oxycodone .  Follow up with final report on foot MRI.

## 2024-02-19 NOTE — Plan of Care (Signed)
   Problem: Health Behavior/Discharge Planning: Goal: Ability to manage health-related needs will improve Outcome: Progressing   Problem: Clinical Measurements: Goal: Ability to maintain clinical measurements within normal limits will improve Outcome: Progressing Goal: Will remain free from infection Outcome: Progressing

## 2024-02-20 LAB — CBC
HCT: 34.5 % — ABNORMAL LOW (ref 36.0–46.0)
Hemoglobin: 11.1 g/dL — ABNORMAL LOW (ref 12.0–15.0)
MCH: 28.4 pg (ref 26.0–34.0)
MCHC: 32.2 g/dL (ref 30.0–36.0)
MCV: 88.2 fL (ref 80.0–100.0)
Platelets: 206 10*3/uL (ref 150–400)
RBC: 3.91 MIL/uL (ref 3.87–5.11)
RDW: 14.8 % (ref 11.5–15.5)
WBC: 9.1 10*3/uL (ref 4.0–10.5)
nRBC: 0 % (ref 0.0–0.2)

## 2024-02-20 LAB — GLUCOSE, CAPILLARY
Glucose-Capillary: 200 mg/dL — ABNORMAL HIGH (ref 70–99)
Glucose-Capillary: 219 mg/dL — ABNORMAL HIGH (ref 70–99)
Glucose-Capillary: 154 mg/dL — ABNORMAL HIGH (ref 70–99)
Glucose-Capillary: 192 mg/dL — ABNORMAL HIGH (ref 70–99)

## 2024-02-20 LAB — C-REACTIVE PROTEIN: CRP: 4.5 mg/dL — ABNORMAL HIGH (ref <1.0)

## 2024-02-20 MED ORDER — IBUPROFEN 600 MG PO TABS
600.0000 mg | ORAL_TABLET | Freq: Three times a day (TID) | ORAL | Status: DC
  Administered 2024-02-20 – 2024-02-21 (×4): 600 mg via ORAL
  Filled 2024-02-20 (×4): qty 1

## 2024-02-20 MED ORDER — FAMOTIDINE 20 MG PO TABS
20.0000 mg | ORAL_TABLET | Freq: Two times a day (BID) | ORAL | Status: DC
  Administered 2024-02-20 – 2024-02-21 (×3): 20 mg via ORAL
  Filled 2024-02-20 (×3): qty 1

## 2024-02-20 NOTE — Plan of Care (Signed)
  Problem: Clinical Measurements: Goal: Diagnostic test results will improve Outcome: Progressing   Problem: Activity: Goal: Risk for activity intolerance will decrease Outcome: Progressing   Problem: Coping: Goal: Level of anxiety will decrease Outcome: Progressing   Problem: Safety: Goal: Ability to remain free from injury will improve Outcome: Progressing   

## 2024-02-20 NOTE — Progress Notes (Addendum)
 PROGRESS NOTE    Patient: Alicia Coffey                            PCP: Bari Boos, FNP                    DOB: 14-May-1971            DOA: 02/18/2024 ZOX:[id number]             DOS: 02/20/2024, 10:38 AM   LOS: 2 days   Date of Service: The patient was seen and examined on 02/20/2024  Subjective:   The patient was seen and examined, stable afebrile, normotensive no leukocytosis No events overnight MRI of the foot discussed with the patient  Brief Narrative:   Alicia Coffey is a 53 y.o. female with medical history significant of left Charcot foot, CKD, arthritis, hyperlipidemia, T2DM, and obesity class 3 who presented with left foot pain.  Reported 2 weeks of left foot pain, associated with local edema, erythema and hot to the touch. Denies any fevers or chills, no nausea or vomiting.  No recent trauma to her left foot, and no local wounds.    She was evaluated in the ED on 03/25, she was diagnosed with charcot foot and a cam walker was prescribed. She was referred to podiatry.      Assessment & Plan:   Principal Problem:   Cellulitis of left ankle Active Problems:   Essential hypertension   Type 2 diabetes mellitus with hyperlipidemia (HCC)   Obesity, class 3   Chronic deep vein thrombosis (DVT) of left lower extremity (HCC)   * Cellulitis of left ankle /?  Osteomyelitis?  Septic joint?  Abscess -MRI of the foot was obtained reviewed detail with orthopedic team  Discussed the findings with Dr. Charol Copas and Dr. Julio Ohm orthopedic team at, -Currently findings of the MRI is consistent with Charcot foot Although significant changes are noted on MRI  -Discussed with ID, recommended to continue triple antibiotics of Zyvox, Rocephin, Flagyl  On exam patient is afebrile, normotensive, with no leukocytosis  -She remained stable hemodynamically   Essential hypertension  -stable - continue hydralazine, metoprolol   irbersartan    Type 2 diabetes mellitus with  hyperlipidemia (HCC) -Checking CBG q. ACHS, SSI coverage CBG - 201 188  200    Obesity, class 3 - Discussed regarding outpatient follow-up weight loss, increase exercise, Body mass index is 43.44 kg/m.    Chronic deep vein thrombosis (DVT) of left lower extremity (HCC) Continue anticoagulation with apixaban       ------------------------------------------------------------------------------------------------------------------------ Nutritional status:  The patient's BMI is: Body mass index is 43.44 kg/m. I agree with the assessment and plan as outlined Skin Assessment: I have examined the patient's skin and I agree with the wound assessment as performed by wound care team As outlined belowe:    ------------------------------------------------------------------------------------------------------------------------------------------------  DVT prophylaxis:  SCDs Start: 02/18/24 2357 apixaban  (ELIQUIS ) tablet 5 mg   Code Status:   Code Status: Full Code  Family Communication: No family member present at bedside-  -Advance care planning has been discussed.   Admission status:   Status is: Inpatient Remains inpatient appropriate because: Needing IV antibiotics, further evaluation to rule out osteomyelitis   Disposition: From  - home             Planning for discharge in 1-2 days: to Home   Procedures:   No admission procedures for hospital encounter.  Antimicrobials:  Anti-infectives (From admission, onward)    Start     Dose/Rate Route Frequency Ordered Stop   02/19/24 2300  cefTRIAXone (ROCEPHIN) 1 g in sodium chloride  0.9 % 100 mL IVPB  Status:  Discontinued        1 g 200 mL/hr over 30 Minutes Intravenous Every 24 hours 02/19/24 0354 02/19/24 1702   02/19/24 2200  linezolid (ZYVOX) IVPB 600 mg       Placed in "And" Linked Group   600 mg 300 mL/hr over 60 Minutes Intravenous Every 12 hours 02/19/24 1702     02/19/24 1800  cefTRIAXone (ROCEPHIN) 2 g in  sodium chloride  0.9 % 100 mL IVPB  Status:  Discontinued       Placed in "And" Linked Group   2 g 200 mL/hr over 30 Minutes Intravenous Once 02/19/24 1702 02/19/24 1702   02/19/24 1800  metroNIDAZOLE (FLAGYL) IVPB 500 mg  Status:  Discontinued       Placed in "And" Linked Group   500 mg 100 mL/hr over 60 Minutes Intravenous Every 8 hours 02/19/24 1702 02/19/24 1702   02/19/24 1800  linezolid (ZYVOX) IVPB 600 mg  Status:  Discontinued       Placed in "And" Linked Group   600 mg 300 mL/hr over 60 Minutes Intravenous  Once 02/19/24 1702 02/19/24 1702   02/19/24 1800  cefTRIAXone (ROCEPHIN) 2 g in sodium chloride  0.9 % 100 mL IVPB       Placed in "And" Linked Group   2 g 200 mL/hr over 30 Minutes Intravenous Every 24 hours 02/19/24 1702     02/19/24 1800  metroNIDAZOLE (FLAGYL) IVPB 500 mg       Placed in "And" Linked Group   500 mg 100 mL/hr over 60 Minutes Intravenous Every 8 hours 02/19/24 1702     02/18/24 2300  cefTRIAXone (ROCEPHIN) 2 g in sodium chloride  0.9 % 100 mL IVPB       Placed in "And" Linked Group   2 g 200 mL/hr over 30 Minutes Intravenous Once 02/18/24 2247 02/18/24 2342   02/18/24 2300  metroNIDAZOLE (FLAGYL) IVPB 500 mg       Placed in "And" Linked Group   500 mg 100 mL/hr over 60 Minutes Intravenous  Once 02/18/24 2247 02/19/24 0030   02/18/24 2300  linezolid (ZYVOX) IVPB 600 mg       Placed in "And" Linked Group   600 mg 300 mL/hr over 60 Minutes Intravenous  Once 02/18/24 2247 02/19/24 0153        Medication:   apixaban   5 mg Oral BID   DULoxetine   60 mg Oral BID   famotidine  20 mg Oral BID   hydrALAZINE  100 mg Oral BID   hydrOXYzine  25 mg Oral QHS   ibuprofen  600 mg Oral TID   insulin  aspart  0-15 Units Subcutaneous TID WC   insulin  glargine-yfgn  15 Units Subcutaneous Q2200   irbesartan   150 mg Oral Daily   metoprolol  tartrate  25 mg Oral BID   rosuvastatin  40 mg Oral QHS    acetaminophen  **OR** acetaminophen , ondansetron  **OR** ondansetron   (ZOFRAN ) IV, oxyCODONE    Objective:   Vitals:   02/19/24 1251 02/19/24 2317 02/20/24 0700 02/20/24 0810  BP: 108/79 125/67 125/72 138/75  Pulse: 79 78 71 73  Resp: (!) 22 16 18 16   Temp: 98.7 F (37.1 C) 98.4 F (36.9 C) 98.5 F (36.9 C)   TempSrc: Oral Oral Oral  SpO2: 97% 99% 99%   Weight:      Height:        Intake/Output Summary (Last 24 hours) at 02/20/2024 1038 Last data filed at 02/20/2024 1610 Gross per 24 hour  Intake 1560 ml  Output --  Net 1560 ml   Filed Weights   02/18/24 1601  Weight: 113 kg     Physical examination:   General:  AAO x 3,  cooperative, no distress;   HEENT:  Normocephalic, PERRL, otherwise with in Normal limits   Neuro:  CNII-XII intact. , normal motor and sensation, reflexes intact   Lungs:   Clear to auscultation BL, Respirations unlabored,  No wheezes / crackles  Cardio:    S1/S2, RRR, No murmure, No Rubs or Gallops   Abdomen:  Soft, non-tender, bowel sounds active all four quadrants, no guarding or peritoneal signs.  Muscular  skeletal:  Left foot edema, limited range of motion, minimal tenderness Limited exam -global generalized weaknesses - in bed, able to move all 4 extremities,   2+ pulses,  symmetric, No pitting edema  Skin:  Dry, warm to touch, negative for any Rashes,  Wounds: Please see nursing documentation   Left foot/ankle    -------------------------------------------------------------------------------------------------------------------------------    LABs:     Latest Ref Rng & Units 02/20/2024    7:48 AM 02/19/2024    3:16 AM 02/18/2024    6:35 PM  CBC  WBC 4.0 - 10.5 K/uL 9.1  10.4  14.3   Hemoglobin 12.0 - 15.0 g/dL 96.0  45.4  09.8   Hematocrit 36.0 - 46.0 % 34.5  35.0  39.0   Platelets 150 - 400 K/uL 206  185  229       Latest Ref Rng & Units 02/19/2024    3:16 AM 02/18/2024    6:35 PM 12/29/2023    4:17 PM  CMP  Glucose 70 - 99 mg/dL 119  147  829   BUN 6 - 20 mg/dL 19  21  20    Creatinine 0.44 -  1.00 mg/dL 5.62  1.30  8.65   Sodium 135 - 145 mmol/L 136  135  138   Potassium 3.5 - 5.1 mmol/L 4.0  3.8  4.1   Chloride 98 - 111 mmol/L 102  99  101   CO2 22 - 32 mmol/L 24  25  25    Calcium 8.9 - 10.3 mg/dL 8.9  9.5  9.4   Total Protein 6.5 - 8.1 g/dL   7.6   Total Bilirubin 0.0 - 1.2 mg/dL   0.6   Alkaline Phos 38 - 126 U/L   81   AST 15 - 41 U/L   24   ALT 0 - 44 U/L   20        Micro Results Recent Results (from the past 240 hours)  Blood Cultures x 2 sites     Status: None (Preliminary result)   Collection Time: 02/18/24 10:55 PM   Specimen: Left Antecubital; Blood  Result Value Ref Range Status   Specimen Description LEFT ANTECUBITAL  Final   Special Requests   Final    BOTTLES DRAWN AEROBIC AND ANAEROBIC Blood Culture adequate volume   Culture   Final    NO GROWTH 2 DAYS Performed at [facility], [address], [place], [place] [address]    Report Status PENDING  Incomplete  Blood Cultures x 2 sites     Status: None (Preliminary result)   Collection Time: 02/18/24 11:08 PM  Specimen: BLOOD LEFT HAND  Result Value Ref Range Status   Specimen Description BLOOD LEFT HAND  Final   Special Requests   Final    BOTTLES DRAWN AEROBIC AND ANAEROBIC Blood Culture adequate volume   Culture   Final    NO GROWTH 2 DAYS Performed at [facility], [address], [place], [place] [address]    Report Status PENDING  Incomplete    Radiology Reports No results found.   SIGNED: Bobbetta Burnet, MD, FHM. FAAFP. Arlin Benes - [facility] Time spent - 55 min.  In seeing, evaluating and examining the patient. Reviewing medical records, labs, drawn plan of care. [facility],  Pager (please use [email or web address] to page/ text) Please use Epic Secure Chat for non-urgent communication (7AM-7PM)  If 7PM-7AM, please contact night-coverage [email or web address], 02/20/2024, 10:38 AM

## 2024-02-21 ENCOUNTER — Telehealth: Payer: Self-pay

## 2024-02-21 ENCOUNTER — Telehealth (HOSPITAL_COMMUNITY): Payer: Self-pay | Admitting: Pharmacy Technician

## 2024-02-21 ENCOUNTER — Other Ambulatory Visit (HOSPITAL_COMMUNITY): Payer: Self-pay

## 2024-02-21 ENCOUNTER — Inpatient Hospital Stay (HOSPITAL_COMMUNITY)

## 2024-02-21 DIAGNOSIS — E11610 Type 2 diabetes mellitus with diabetic neuropathic arthropathy: Secondary | ICD-10-CM | POA: Diagnosis not present

## 2024-02-21 DIAGNOSIS — L03116 Cellulitis of left lower limb: Secondary | ICD-10-CM | POA: Diagnosis not present

## 2024-02-21 LAB — GLUCOSE, CAPILLARY
Glucose-Capillary: 135 mg/dL — ABNORMAL HIGH (ref 70–99)
Glucose-Capillary: 207 mg/dL — ABNORMAL HIGH (ref 70–99)
Glucose-Capillary: 181 mg/dL — ABNORMAL HIGH (ref 70–99)

## 2024-02-21 LAB — CBC
HCT: 33.8 % — ABNORMAL LOW (ref 36.0–46.0)
Hemoglobin: 10.5 g/dL — ABNORMAL LOW (ref 12.0–15.0)
MCH: 27.5 pg (ref 26.0–34.0)
MCHC: 31.1 g/dL (ref 30.0–36.0)
MCV: 88.5 fL (ref 80.0–100.0)
Platelets: 219 10*3/uL (ref 150–400)
RBC: 3.82 MIL/uL — ABNORMAL LOW (ref 3.87–5.11)
RDW: 14.6 % (ref 11.5–15.5)
WBC: 9.1 10*3/uL (ref 4.0–10.5)
nRBC: 0 % (ref 0.0–0.2)

## 2024-02-21 MED ORDER — DOXYCYCLINE HYCLATE 100 MG PO TABS: 100.0000 mg | ORAL_TABLET | Freq: Two times a day (BID) | ORAL | Status: DC

## 2024-02-21 MED ORDER — LIVING WELL WITH DIABETES BOOK: Freq: Once | Status: AC

## 2024-02-21 MED ORDER — CEFADROXIL 500 MG PO CAPS
1000.0000 mg | ORAL_CAPSULE | Freq: Two times a day (BID) | ORAL | Status: DC
  Filled 2024-02-21 (×2): qty 2

## 2024-02-21 MED ORDER — CEFADROXIL 500 MG PO CAPS: 1000.0000 mg | ORAL_CAPSULE | Freq: Two times a day (BID) | ORAL | 0 refills | Status: DC

## 2024-02-21 MED ORDER — DOXYCYCLINE HYCLATE 100 MG PO TABS: 100.0000 mg | ORAL_TABLET | Freq: Two times a day (BID) | ORAL | 0 refills | Status: DC

## 2024-02-21 MED ORDER — LACTINEX PO CHEW: 1.0000 | CHEWABLE_TABLET | Freq: Three times a day (TID) | ORAL | 0 refills | Status: AC

## 2024-02-21 MED ORDER — AMOXICILLIN-POT CLAVULANATE 875-125 MG PO TABS: 1.0000 | ORAL_TABLET | Freq: Two times a day (BID) | ORAL | Status: DC

## 2024-02-21 NOTE — Inpatient Diabetes Management (Signed)
 Inpatient Diabetes Program Recommendations  AACE/ADA: New Consensus Statement on Inpatient Glycemic Control (2015)  Target Ranges:  Prepandial:   less than 140 mg/dL      Peak postprandial:   less than 180 mg/dL (1-2 hours)      Critically ill patients:  140 - 180 mg/dL   Lab Results  Component Value Date   GLUCAP 181 (H) 02/21/2024   HGBA1C 9.5 (H) 02/19/2024    Latest Reference Range & Units 02/20/24 07:31 02/20/24 11:13 02/20/24 16:33 02/20/24 20:27 02/21/24 07:11 02/21/24 12:04  Glucose-Capillary 70 - 99 mg/dL 454 (H) 098 (H) 119 (H) 192 (H) 135 (H) 181 (H)  (H): Data is abnormally high  Diabetes history: DM2 Outpatient Diabetes medications: Lantus  40 units daily, Novolog 2-15 units tid ac meals, Ozempic 1 mg weekly, Synjardy 25-1gm daily Current orders for Inpatient glycemic control: Semglee  15 units daily, Novolog 0-15 units tid  Inpatient Diabetes Program Recommendations:   Spoke with pt via phone (DM coordinator @ [facility]) about  A1C results of 9.5 (average blood glucose 226 over the past 2-3 months) and explained what an A1C is, basic pathophysiology of DM Type 2, basic home care, basic diabetes diet nutrition principles, importance of checking CBGs and maintaining good CBG control to prevent long-term and short-term complications. Reviewed signs and symptoms of hyperglycemia and hypoglycemia and how to treat hypoglycemia at home. Also reviewed blood sugar goals at home.  RNs to provide ongoing basic DM education at bedside with this patient. Ordered Living Well With Diabetes Booklet. Patient states she has been taking her medications as prescribed but has noticed her CBGs increase since her white blood count increased with the infection in her foot.  Will follow while hospitalized.  Thank you, Dealie Koelzer E. Quanita Barona, RN, MSN, CDCES  Diabetes Coordinator Inpatient Glycemic Control Team Team Pager [phone] (8am-5pm) 02/21/2024 12:55 PM

## 2024-02-21 NOTE — Discharge Summary (Addendum)
 Physician Discharge Summary   Patient: Alicia Coffey MRN: [id number] DOB: Nov 04, 1970  Admit date:     02/18/2024  Discharge date: 02/21/24  Discharge Physician: Bobbetta Burnet   PCP: Bari Boos, FNP   Recommendations at discharge:   Follow-up with infectious disease doctor Dr. Zelda Hickman on March 17, 2024 Follow-up with orthopedic doctor Dr. Julio Ohm  in 1-2 weeks Continue nonweightbearing weight to the left foot - obtain boot as soon as possible Follow-up with PCP in 1-2 weeks  Discharge Diagnoses: Principal Problem:   Cellulitis of left ankle Active Problems:   Essential hypertension   Type 2 diabetes mellitus with hyperlipidemia (HCC)   Obesity, class 3   Chronic deep vein thrombosis (DVT) of left lower extremity (HCC)  Resolved Problems:   * No resolved hospital problems. *  Hospital Course: Alicia Coffey is a 53 y.o. female with medical history significant of left Charcot foot, CKD, arthritis, hyperlipidemia, T2DM, and obesity class 3 who presented with left foot pain.  Reported 2 weeks of left foot pain, associated with local edema, erythema and hot to the touch. Denies any fevers or chills, no nausea or vomiting.  No recent trauma to her left foot, and no local wounds.    She was evaluated in the ED on 03/25, she was diagnosed with charcot foot and a cam walker was prescribed. She was referred to podiatry.      *  Charcot foot with Cellulitis of left ankle /?  Osteomyelitis?  Septic joint ?  Abscess On exam patient is afebrile, normotensive, with no leukocytosis -MRI of the foot was obtained reviewed detail with orthopedic team  Discussed the findings with Dr. Charol Copas and Dr. Julio Ohm orthopedic team at [facility] -Currently findings of the MRI is consistent with Charcot foot Although significant changes are noted on MRI including abscess  -Discussed with ID, appreciate evaluation and recommendation from Dr. Zelda Hickman Patient has been on triple IV antibiotics of Zyvox, Rocephin,  Flagyl x 4 days now  - Consultation to IR possible I&D of the abscess    Discussed with IR team Dr. Velna Ghee- abscess fluid not significant enough for I&D. -Final recommendation from Dr. Zelda Hickman,: Antibiotics: cefadroxil 1gm bid+ doxy 100mg  po bid x 4 weeks  Follow-up with ID on March 14, 2024 Follow-up with orthopedic, Dr. Julio Ohm   Essential hypertension   continue hydralazine, metoprolol   irbersartan    Type 2 diabetes mellitus with hyperlipidemia (HCC) - Resume home regimen: Including Lantus , NovoLog and Ozempic   Obesity, class 3 - Discussed regarding outpatient follow-up weight loss, increase exercise, Body mass index is 43.44 kg/m.    Chronic deep vein thrombosis (DVT) of left lower extremity (HCC) Continue anticoagulation with apixaban       Consultants: Orthopedic team (Dr. Julio Ohm, Dr. Charol Copas) infectious disease Dr. Zelda Hickman  Procedures performed: MRI of the left foot Disposition: Home Diet recommendation:  Discharge Diet Orders (From admission, onward)     Start     Ordered   02/21/24 0000  Diet - low sodium heart healthy        02/21/24 1435           Cardiac and Carb modified diet DISCHARGE MEDICATION: Allergies as of 02/21/2024       Reactions   Penicillins Other (See Comments)   PATIENT HAS HAD A PCN REACTION WITH IMMEDIATE RASH, FACIAL/TONGUE/THROAT SWELLING, SOB, OR LIGHTHEADEDNESS WITH HYPOTENSION:  #  #  YES  #  #  Has patient had a PCN reaction causing  severe rash involving mucus membranes or skin necrosis: No Has patient had a PCN reaction that required hospitalization No Has patient had a PCN reaction occurring within the last 10 years: No   Bee Venom Dermatitis, Itching, Other (See Comments)   Latex    Other Hives, Itching, Rash   Powder in gloves Arthropod Insect        Medication List     STOP taking these medications    Fish Oil Burp-Less 1000 MG Caps   meloxicam  15 MG tablet Commonly known as: MOBIC    methocarbamol 500 MG  tablet Commonly known as: ROBAXIN   RED YEAST RICE PO   zinc  gluconate 50 MG tablet       TAKE these medications    acetaminophen  500 MG tablet Commonly known as: TYLENOL  Take 1,000 mg by mouth every 6 (six) hours as needed for mild pain or headache.   albuterol 108 (90 Base) MCG/ACT inhaler Commonly known as: VENTOLIN HFA Inhale 2 puffs into the lungs every 6 (six) hours as needed for wheezing or shortness of breath.   apixaban  5 MG Tabs tablet Commonly known as: ELIQUIS  Take 1 tablet (5 mg total) by mouth 2 (two) times daily.   aspirin EC 81 MG tablet Take 81 mg by mouth at bedtime. Swallow whole.   B-COMPLEX BALANCED PO Take 1 tablet by mouth daily at 12 noon.   bumetanide 0.5 MG tablet Commonly known as: BUMEX Take 0.25 mg by mouth 2 (two) times daily.   cefadroxil 500 MG capsule Commonly known as: DURICEF Take 2 capsules (1,000 mg total) by mouth 2 (two) times daily.   cholecalciferol 25 MCG (1000 UNIT) tablet Commonly known as: VITAMIN D3 Take 1,000 Units by mouth daily at 12 noon.   ciclopirox 0.77 % cream Commonly known as: LOPROX Apply 1 application topically 2 (two) times daily as needed (itching).   CRANBERRY FRUIT PO Take 1 capsule by mouth daily at 12 noon.   doxycycline 100 MG tablet Commonly known as: VIBRA-TABS Take 1 tablet (100 mg total) by mouth every 12 (twelve) hours.   DSS 100 MG Caps Take 1 capsule by mouth 2 (two) times daily.   DULoxetine  60 MG capsule Commonly known as: Cymbalta  Take 1 capsule (60 mg total) by mouth daily. What changed: when to take this   GNP Magnesium Oxide 250 MG Tabs Generic drug: Magnesium Oxide Take 1 tablet by mouth daily.   hydrALAZINE 100 MG tablet Commonly known as: APRESOLINE Take 100 mg by mouth 2 (two) times daily.   hydrocortisone  cream 1 % Apply to affected area 2 times daily What changed:  when to take this reasons to take this   hydrOXYzine 25 MG tablet Commonly known as:  ATARAX Take 25 mg by mouth daily at 12 noon.   insulin  glargine 100 UNIT/ML injection Commonly known as: LANTUS  Inject 40 Units into the skin at bedtime.   irbesartan  150 MG tablet Commonly known as: AVAPRO  TAKE 1 TABLET BY MOUTH EVERY DAY What changed: when to take this   Kerendia 10 MG Tabs Generic drug: Finerenone Take 10 mg by mouth every Monday, Wednesday, and Friday. On Monday, Wednesday, & Friday   lactobacillus acidophilus & bulgar chewable tablet Chew 1 tablet by mouth 3 (three) times daily with meals.   metoprolol  tartrate 25 MG tablet Commonly known as: LOPRESSOR  Take 25 mg by mouth 2 (two) times daily.   NovoLOG FlexPen 100 UNIT/ML FlexPen Generic drug: insulin  aspart Inject 2-15 Units into the  skin 3 (three) times daily with meals. Per sliding scale for levels that exceeds 121-150.   Ozempic (1 MG/DOSE) 4 MG/3ML Sopn Generic drug: Semaglutide (1 MG/DOSE) Inject 1 mg into the skin every Sunday.   Poly-Iron 150 150 MG capsule Generic drug: iron polysaccharides Take 150 mg by mouth 2 (two) times daily.   rosuvastatin 40 MG tablet Commonly known as: CRESTOR Take 40 mg by mouth at bedtime.   Synjardy XR 25-1000 MG Tb24 Generic drug: Empagliflozin-metFORMIN HCl ER Take 1 tablet by mouth at bedtime.   traMADol 50 MG tablet Commonly known as: ULTRAM Take 50 mg by mouth every morning.        Discharge Exam: Filed Weights   02/18/24 1601  Weight: 113 kg   General:  AAO x 3,  cooperative, no distress;   HEENT:  Normocephalic, PERRL, otherwise with in Normal limits   Neuro:  CNII-XII intact. , normal motor and sensation, reflexes intact   Lungs:   Clear to auscultation BL, Respirations unlabored,  No wheezes / crackles  Cardio:    S1/S2, RRR, No murmure, No Rubs or Gallops   Abdomen:  Soft, non-tender, bowel sounds active all four quadrants, no guarding or peritoneal signs.  Muscular  skeletal:  Limited exam -global generalized weaknesses - in bed,  able to move all 4 extremities,   2+ pulses,  symmetric,  Left foot edema,-range of motion limited due to edema, negative any erythema, negative for any open wound or drainage See picture below  Skin:  Dry, warm to touch, negative for any Rashes,  Wounds: Please see nursing documentation Left foot/ankle    Condition at discharge: good  The results of significant diagnostics from this hospitalization (including imaging, microbiology, ancillary and laboratory) are listed below for reference.   Imaging Studies: US  LT LOWER EXTREM LTD SOFT TISSUE NON VASCULAR Result Date: 02/21/2024 CLINICAL DATA:  Abscess, possible joint effusion EXAM: ULTRASOUND LEFT LOWER EXTREMITY LIMITED TECHNIQUE: Ultrasound examination of the lower extremity soft tissues was performed in the area of clinical concern. COMPARISON:  MRI 02/18/2024 FINDINGS: Recent MRI of 02/18/2024 demonstrated Charcot arthropathy at the Chopart joint with extensive fragmentation and collapse especially involving the talus, with a complex and marginally enhancing fluid collection extending in the large defect corresponding to the anterior talus and cephalad from this location. On today's ultrasound in this region deep to the extensor tendons we demonstrate a complex collection as on image 18 series 1, with marginal color flow likely corresponding to the synovitis and enhancement on MRI, and with bony fragments along the distal margin. The presumed complex fluid in this vicinity is of similar echogenicity to the surrounding synovitis for example on image 19 series 1. The technologist also obtained posterior ankle images depicting fusiform expansion and accentuated signal in the distal Achilles tendon compatible with Achilles tendinopathy. On image 11 of series 1 there is an ovoid moderately echogenic structure which presumably corroborates with the ovoid fluid collection within or along the flexor hallucis longus myotendinous junction, abscess not  excluded. IMPRESSION: 1. Complex collection deep to the extensor tendons at the level of the anterior talus, with marginal color flow likely corresponding to the synovitis and enhancement on MRI, and with bony fragments along the distal margin. The presumed complex fluid in this vicinity is of similar echogenicity to the surrounding synovitis on MRI. 2. Posterior ankle ovoid moderately echogenic structure which presumably corroborates with the ovoid fluid collection within or along the flexor hallucis longus myotendinous junction, abscess not excluded.  3. Achilles tendinopathy. Electronically Signed   By: Freida Jes M.D.   On: 02/21/2024 12:33   MR FOOT LEFT W WO CONTRAST Result Date: 02/19/2024 CLINICAL DATA:  Soft tissue infection suspected, foot, xray done EXAM: MRI OF THE LEFT FOOT WITHOUT AND WITH CONTRAST TECHNIQUE: Multiplanar, multisequence MR imaging of the left hindfoot was performed both before and after administration of intravenous contrast. CONTRAST:  10mL GADAVIST  GADOBUTROL  1 MMOL/ML IV SOLN COMPARISON:  X-ray 02/18/2024 FINDINGS: Bones/Joint/Cartilage Markedly abnormal appearance of the hindfoot and midfoot with extensive osseous fragmentation which may be related to neuropathic/Charcot arthropathy. Patchy bone marrow edema throughout the bones of the hindfoot and midfoot as well as the distal tibia and fibula. Multifocal areas of erosion or osseous resorption including involvement of the talus, navicular, anterior calcaneus, cuboid, and portions of the cuneiform bones. There is also some small erosive changes to the tibial plafond. Large complex joint effusion at the midfoot communicating with the tibiotalar, subtalar, and midfoot articulations. There is thick rim enhancement. This joint collection measures approximately 4.7 x 2.9 x 4.2 cm in size. Additional sites of focal bone marrow edema within the proximal diaphyses of the third and fourth metatarsals, favoring stress-related  changes. Ligaments Medial and lateral ankle ligaments are ill-defined, likely chronically torn. Dorsal capsular ligaments of the midfoot are ill-defined. The Lisfranc ligament is intact. Muscles and Tendons Diffuse edema-like signal of the foot musculature which may represent changes related to denervation and/or myositis. Enhancing tenosynovitis of the peroneal tendons. Soft tissues Diffuse soft tissue swelling and edema of the ankle and hindfoot. Rim enhancing fluid collection within the deep soft tissues of the posterior lower leg which appears partially located in the flexor hallucis longus muscle measuring approximately 3.3 x 1.6 x 1.7 cm. IMPRESSION: 1. Markedly abnormal appearance of the hindfoot and midfoot with extensive osseous fragmentation which may be related to neuropathic/Charcot arthropathy. Multifocal areas of bony erosion and/or resorption may reflect a superimposed osteomyelitis. Correlate with serum inflammatory markers. 2. Large complex joint effusion at the midfoot communicating with the tibiotalar, subtalar, and midfoot articulations. Findings are concerning for a superimposed septic arthritis. 3. Rim-enhancing fluid collection within the deep soft tissues of the posterior lower leg which appears partially located in the flexor hallucis longus muscle measuring approximately 3.3 x 1.6 x 1.7 cm. This is concerning for abscess. 4. Enhancing tenosynovitis of the peroneal tendons. 5. Diffuse edema-like signal of the foot musculature which may represent changes related to denervation and/or myositis. 6. Additional sites of focal bone marrow edema within the proximal diaphyses of the third and fourth metatarsals, favoring stress-related changes. Electronically Signed   By: Leverne Reading D.O.   On: 02/19/2024 10:26   DG Foot Complete Left Result Date: 02/18/2024 CLINICAL DATA:  Pain. EXAM: LEFT FOOT - COMPLETE 3+ VIEW; LEFT ANKLE COMPLETE - 3+ VIEW COMPARISON:  Foot radiograph 12/29/2023,  08/30/2023 FINDINGS: Foot: Diffusely decreased bone mineralization. Comminuted ununited fracture of the navicular without change from prior exam. Increasing ossific densities adjacent to the dorsal aspect of the talonavicular joint is well as in the lateral mid hindfoot. Downsloping of the talus. Widening of the talonavicular joint with moderate ankle joint effusion. Generalized soft tissue edema. No acute fracture. Ankle: Widening of the talonavicular joint. Ill-defined lucency involving the medial talar dome. Also unchanged. Downsloping of the talus with disruption of the talonavicular joint. No acute fracture. Generalized soft tissue edema. Vascular calcifications are seen. IMPRESSION: 1. Findings suspicious for Charcot arthropathy of the foot. Chronic ununited navicular  fracture. Increasing downsloping of the talus and increasing bony fragmentation about the talonavicular joint. 2. Widening of the talonavicular joint with downsloping of the talus. Ankle joint effusion. 3. Ill-defined lucency involving the medial talar dome, may represent osteochondral lesion. 4. Generalized soft tissue edema. Electronically Signed   By: Chadwick Colonel M.D.   On: 02/18/2024 17:30   DG Ankle Complete Left Result Date: 02/18/2024 CLINICAL DATA:  Pain. EXAM: LEFT FOOT - COMPLETE 3+ VIEW; LEFT ANKLE COMPLETE - 3+ VIEW COMPARISON:  Foot radiograph 12/29/2023, 08/30/2023 FINDINGS: Foot: Diffusely decreased bone mineralization. Comminuted ununited fracture of the navicular without change from prior exam. Increasing ossific densities adjacent to the dorsal aspect of the talonavicular joint is well as in the lateral mid hindfoot. Downsloping of the talus. Widening of the talonavicular joint with moderate ankle joint effusion. Generalized soft tissue edema. No acute fracture. Ankle: Widening of the talonavicular joint. Ill-defined lucency involving the medial talar dome. Also unchanged. Downsloping of the talus with disruption of the  talonavicular joint. No acute fracture. Generalized soft tissue edema. Vascular calcifications are seen. IMPRESSION: 1. Findings suspicious for Charcot arthropathy of the foot. Chronic ununited navicular fracture. Increasing downsloping of the talus and increasing bony fragmentation about the talonavicular joint. 2. Widening of the talonavicular joint with downsloping of the talus. Ankle joint effusion. 3. Ill-defined lucency involving the medial talar dome, may represent osteochondral lesion. 4. Generalized soft tissue edema. Electronically Signed   By: Chadwick Colonel M.D.   On: 02/18/2024 17:30   VAS US  LOWER EXTREMITY VENOUS (DVT) Result Date: 02/18/2024  Lower Venous DVT Study Patient Name:  ROZANNE MCMEEKIN  Date of Exam:   02/17/2024 Medical Rec #: [id number]       Accession #:    [id number] Date of Birth: 1971-04-22       Patient Gender: F Patient Age:   70 years Exam Location:  [facility] Procedure:      VAS US  LOWER EXTREMITY VENOUS (DVT) Referring Phys: Cameron Cea --------------------------------------------------------------------------------  Indications: Swelling. Other Indications: Currently in CAM boot from Charcot foot complications. Risk Factors: DVT LLE posterior tibial veins (08/20/2023). Limitations: Poor ultrasound/tissue interface. Comparison Study: Previous exam on 12/29/2023 was negative for DVT. Performing Technologist: Arlyce Berger RVT, RDMS  Examination Guidelines: A complete evaluation includes B-mode imaging, spectral Doppler, color Doppler, and power Doppler as needed of all accessible portions of each vessel. Bilateral testing is considered an integral part of a complete examination. Limited examinations for reoccurring indications may be performed as noted. The reflux portion of the exam is performed with the patient in reverse Trendelenburg.  +-----+---------------+---------+-----------+----------+--------------+ RIGHTCompressibilityPhasicitySpontaneityPropertiesThrombus  Aging +-----+---------------+---------+-----------+----------+--------------+ CFV  Full           Yes      Yes                                 +-----+---------------+---------+-----------+----------+--------------+   +--------+---------------+---------+-----------+----------+--------------------+ LEFT    CompressibilityPhasicitySpontaneityPropertiesThrombus Aging       +--------+---------------+---------+-----------+----------+--------------------+ CFV     Full           Yes      Yes                                       +--------+---------------+---------+-----------+----------+--------------------+ SFJ     Full                                                              +--------+---------------+---------+-----------+----------+--------------------+  FV Prox Full           Yes      Yes                                       +--------+---------------+---------+-----------+----------+--------------------+ FV Mid  Full           Yes      Yes                                       +--------+---------------+---------+-----------+----------+--------------------+ FV      Full           Yes      Yes                                       Distal                                                                    +--------+---------------+---------+-----------+----------+--------------------+ PFV                    Yes      Yes                  patent by                                                                 color/doppler        +--------+---------------+---------+-----------+----------+--------------------+ POP     Full           Yes      Yes                                       +--------+---------------+---------+-----------+----------+--------------------+ PTV     Full                                                              +--------+---------------+---------+-----------+----------+--------------------+ PERO                                                  Not well visualized  +--------+---------------+---------+-----------+----------+--------------------+     Summary: RIGHT: - No evidence of common femoral vein obstruction.   LEFT: - There is no evidence of deep vein thrombosis in the lower extremity.  - No cystic structure found in the popliteal fossa. - Ultrasound characteristics of enlarged lymph  nodes noted in the groin. Subcutaneous edema in area of the ankle.  *See table(s) above for measurements and observations. Electronically signed by Runell Countryman on 02/18/2024 at 9:32:35 AM.    Final     Microbiology: Results for orders placed or performed during the hospital encounter of 02/18/24  Blood Cultures x 2 sites     Status: None (Preliminary result)   Collection Time: 02/18/24 10:55 PM   Specimen: Left Antecubital; Blood  Result Value Ref Range Status   Specimen Description LEFT ANTECUBITAL  Final   Special Requests   Final    BOTTLES DRAWN AEROBIC AND ANAEROBIC Blood Culture adequate volume   Culture   Final    NO GROWTH 3 DAYS Performed at [facility], [address], [place], [place] [address]    Report Status PENDING  Incomplete  Blood Cultures x 2 sites     Status: None (Preliminary result)   Collection Time: 02/18/24 11:08 PM   Specimen: BLOOD LEFT HAND  Result Value Ref Range Status   Specimen Description BLOOD LEFT HAND  Final   Special Requests   Final    BOTTLES DRAWN AEROBIC AND ANAEROBIC Blood Culture adequate volume   Culture   Final    NO GROWTH 3 DAYS Performed at [facility], [address] [place], [place] [address]    Report Status PENDING  Incomplete    Labs: CBC: Recent Labs  Lab 02/18/24 1835 02/19/24 0316 02/20/24 0748 02/21/24 0348  WBC 14.3* 10.4 9.1 9.1  NEUTROABS 9.7*  --   --   --   HGB 12.4 10.7* 11.1* 10.5*  HCT 39.0 35.0* 34.5* 33.8*  MCV 87.8 88.6 88.2 88.5  PLT 229 185 206 219   Basic Metabolic Panel: Recent Labs  Lab 02/18/24 1835 02/19/24 0316   NA 135 136  K 3.8 4.0  CL 99 102  CO2 25 24  GLUCOSE 162* 292*  BUN 21* 19  CREATININE 0.99 0.85  CALCIUM 9.5 8.9   Liver Function Tests: No results for input(s): "AST", "ALT", "ALKPHOS", "BILITOT", "PROT", "ALBUMIN" in the last 168 hours. CBG: Recent Labs  Lab 02/20/24 1113 02/20/24 1633 02/20/24 2027 02/21/24 0711 02/21/24 1204  GLUCAP 219* 154* 192* 135* 181*    Discharge time spent: greater than 30 minutes.  Signed: Bobbetta Burnet, MD [facility] 02/21/2024

## 2024-02-21 NOTE — Plan of Care (Signed)
   Problem: Activity: Goal: Risk for activity intolerance will decrease Outcome: Progressing   Problem: Coping: Goal: Level of anxiety will decrease Outcome: Progressing

## 2024-02-21 NOTE — Consult Note (Signed)
 Virtual Visit via Telephone/Video Note   I connected with Alicia Coffey   On 02/21/2024 at 2:52 PM  by Video and verified that I am speaking with the correct person using two identifiers.   I discussed the limitations, risks, security and privacy concerns of performing an evaluation and management service by video and the availability of in person visit   Location:   Patient: AP Provider: [facility]    Date of Admission:  02/18/2024   Total days of inpatient antibiotics 3        Reason for Consult: left foot infection    Principal Problem:   Cellulitis of left ankle Active Problems:   Essential hypertension   Obesity, class 3   Type 2 diabetes mellitus with hyperlipidemia (HCC)   Chronic deep vein thrombosis (DVT) of left lower extremity [facility])   Assessment: 53 year old female with history of Charcot foot, CKD, diabetes type 2 with left foot pain found to have: #Concern for left foot abscess, septic arthritis, osteomyelitis #Penicillin allergy with rash(as a teenager) - Patient states that she has had left foot pain on and off for about a year.  She has worn a boot for some amount of time.  Noted that about 2 weeks ago the pain was worse, increased edema and erythema. - On arrival her vitals were stable, WBC 14K.  Blood cultures remain negative.  She started on broad-spectrum antibiotics with ceftriaxone linezolid and metronidazole. -Ortho recommended follow-up outpatient.  IR engaged for possible aspiration of fluid collection, ankle collection.  Collections are not amenable to aspiration. Recommendations:  -Will transition to p.o. antibiotics cefadroxil 1 g twice daily and doxycycline for about a month.  Follow-up with infectious disease on 6/3. Recommend repeat imaging in about a month. - Recommended follow-up with orthopedics.  - Placed stable.  Reviewed fever precaution, if wound worsens to come to the ED. - Plan discussed with primary -  ID will sign off  Evaluation of this patient requires complex antimicrobial therapy evaluation and counseling + isolation needs for disease transmission risk assessment and mitigation   Microbiology:   Antibiotics: Ceftriaxone 5/9-present Linezolid 5/9-present metronidazole 5/9 of present  Cultures: Blood 9/9 no growth Urine  Other   HPI: Alicia Coffey is a 53 y.o. female past medical history of left Charcot foot, CKD, arthritis hyperlipidemia, diabetes type 2, obesity  presented with left foot pain.  Patient noted that she had left foot pain for about a year.  Lately it had increased edema and erythema.  On arrival to the ED WBC 14K, patient afebrile.  Right upper left foot showed abnormal appearance of hindfoot and midfoot with excessive osseous fragmentation may be related to neuropathy Charcot arthropathy.  Could be superimposed osteomyelitis.  Large complex fluid collection at the midfoot concerning for septic arthritis.  Rim-enhancing fluid collection in posterior lower leg concerning for abscess.  Gust with primary that orthopedics were engaged recommended outpatient follow-up.  We had IR engaged and collection is not amenable to drainage.   Review of Systems: Review of Systems  All other systems reviewed and are negative.   Past Medical History:  Diagnosis Date   Anemia    Anxiety    Arthritis    Right knee   CKD (chronic kidney disease) stage 2, GFR 60-89 ml/min    Dr. 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   Morbid obesity with BMI of 40.0-44.9, adult (HCC)    Obstructive sleep apnea of adult 2017   Oropharyngeal dysphagia    Peripheral neuropathy    Proteinuria    Secondary  hyperparathyroidism (HCC)    Shortness of breath on exertion    Type 2 diabetes mellitus (HCC)    Uterine fibroid    Vertigo 2015   Vitamin D deficiency    Wears dentures     Social History   Tobacco Use   Smoking status: Never    Passive exposure: Never   Smokeless tobacco: Never  Vaping Use   Vaping status: Never Used  Substance Use Topics   Alcohol use: No   Drug use: No    Family History  Problem Relation Age of Onset   Diabetes Mother    Heart disease Mother    Hypertension Mother    Diabetes Father    Heart disease Father    Hypertension Father    Heart attack Father    Diabetes Sister    Fibromyalgia Sister    Diabetes Sister    Diabetes Brother    Heart attack Maternal Grandmother        <35   Heart attack Maternal Grandfather    Breast cancer Paternal Grandmother 20       breast; dbl mastectomy   Heart attack Paternal Grandfather    Colon polyps Neg Hx    Colon cancer Neg Hx    Crohn's disease Neg Hx    Esophageal cancer Neg Hx    Rectal cancer Neg Hx    Stomach cancer Neg Hx    Scheduled Meds:  apixaban   5 mg Oral BID   cefadroxil  1,000 mg Oral BID   doxycycline  100 mg Oral Q12H   DULoxetine   60 mg Oral BID   famotidine  20 mg Oral BID   hydrALAZINE  100 mg Oral BID   hydrOXYzine  25 mg Oral QHS   ibuprofen  600 mg Oral TID   insulin  aspart  0-15 Units Subcutaneous TID WC   insulin  glargine-yfgn  15 Units Subcutaneous Q2200   irbesartan   150 mg Oral Daily   metoprolol  tartrate  25 mg Oral BID   rosuvastatin  40 mg Oral QHS   Continuous Infusions: PRN Meds:.acetaminophen  **OR** acetaminophen , ondansetron  **OR** ondansetron  (ZOFRAN ) IV, oxyCODONE  Allergies  Allergen Reactions   Penicillins Other (See Comments)    PATIENT HAS HAD A PCN REACTION WITH IMMEDIATE RASH, FACIAL/TONGUE/THROAT SWELLING, SOB, OR LIGHTHEADEDNESS WITH HYPOTENSION:  #  #  YES  #  #  Has patient had a PCN reaction causing severe rash involving mucus membranes or skin  necrosis: No Has patient had a PCN reaction that required hospitalization No Has patient had a PCN reaction occurring within the last 10 years: No    Bee Venom Dermatitis, Itching and Other (See Comments)   Latex    Other Hives, Itching and Rash    Powder in gloves Arthropod Insect    OBJECTIVE: Blood pressure (!) 124/59, pulse 72, temperature 98.1 F (36.7 C), temperature source Oral, resp. rate 17, height 5' 3.5" (1.613 m), weight 113 kg, SpO2 98%.   Lab Results Lab Results  Component Value Date  WBC 9.1 02/21/2024   HGB 10.5 (L) 02/21/2024   HCT 33.8 (L) 02/21/2024   MCV 88.5 02/21/2024   PLT 219 02/21/2024    Lab Results  Component Value Date   CREATININE 0.85 02/19/2024   BUN 19 02/19/2024   NA 136 02/19/2024   K 4.0 02/19/2024   CL 102 02/19/2024   CO2 24 02/19/2024    Lab Results  Component Value Date   ALT 20 12/29/2023   AST 24 12/29/2023   ALKPHOS 81 12/29/2023   BILITOT 0.6 12/29/2023       Orlie Bjornstad, MD [facility] 02/21/2024, 2:51 PM

## 2024-02-21 NOTE — Telephone Encounter (Signed)
 Per Dr. Julio Ohm call pt to make an appt in office after hospital d/c

## 2024-02-21 NOTE — Progress Notes (Signed)
 PROGRESS NOTE    Patient: Alicia Coffey                            PCP: Bari Boos, FNP                    DOB: April 28, 1971            DOA: 02/18/2024 ZOX:[id number]             DOS: 02/21/2024, 12:24 PM   LOS: 3 days   Date of Service: The patient was seen and examined on 02/21/2024  Subjective:  The patient was seen and examined this morning, stable no acute distress Denies of having any foot pain at rest only with weightbearing. Afebrile normotensive with no leukocytosis  On triple antibiotics discussed with patient regarding IR intervention for abscess drainage  Brief Narrative:   Alicia Coffey is a 53 y.o. female with medical history significant of left Charcot foot, CKD, arthritis, hyperlipidemia, T2DM, and obesity class 3 who presented with left foot pain.  Reported 2 weeks of left foot pain, associated with local edema, erythema and hot to the touch. Denies any fevers or chills, no nausea or vomiting.  No recent trauma to her left foot, and no local wounds.    She was evaluated in the ED on 03/25, she was diagnosed with charcot foot and a cam walker was prescribed. She was referred to podiatry.      Assessment & Plan:   Principal Problem:   Cellulitis of left ankle Active Problems:   Essential hypertension   Type 2 diabetes mellitus with hyperlipidemia (HCC)   Obesity, class 3   Chronic deep vein thrombosis (DVT) of left lower extremity (HCC)   * Cellulitis of left ankle /?  Osteomyelitis?  Septic joint ?  Abscess -MRI of the foot was obtained reviewed detail with orthopedic team  Discussed the findings with Dr. Charol Copas and Dr. Julio Ohm orthopedic team at [facility] -Currently findings of the MRI is consistent with Charcot foot Although significant changes are noted on MRI including abscess  -Discussed with ID, appreciate evaluation and recommendation from Dr. Zelda Hickman recommended to continue triple antibiotics of Zyvox, Rocephin, Flagyl - Consultation to IR possible  I&D of the abscess  On exam patient is afebrile, normotensive, with no leukocytosis  -She remained stable hemodynamically-continuing antibiotics   Planning for ultrasound-guided I&D  with  IR    Essential hypertension  -stable - continue hydralazine, metoprolol   irbersartan    Type 2 diabetes mellitus with hyperlipidemia (HCC) -Checking CBG q. ACHS, SSI coverage CBG - 201 188  200   Obesity, class 3 - Discussed regarding outpatient follow-up weight loss, increase exercise, Body mass index is 43.44 kg/m.    Chronic deep vein thrombosis (DVT) of left lower extremity (HCC) Continue anticoagulation with apixaban       ------------------------------------------------------------------------------------------------------------------------ Nutritional status:  The patient's BMI is: Body mass index is 43.44 kg/m. I agree with the assessment and plan as outlined Skin Assessment: I have examined the patient's skin and I agree with the wound assessment as performed by wound care team As outlined belowe:    ------------------------------------------------------------------------------------------------------------------------------------------------  DVT prophylaxis:  SCDs Start: 02/18/24 2357 apixaban  (ELIQUIS ) tablet 5 mg   Code Status:   Code Status: Full Code  Family Communication: No family member present at bedside-  -Advance care planning has been discussed.   Admission status:   Status is: Inpatient Remains inpatient  appropriate because: Needing IV antibiotics, further evaluation to rule out osteomyelitis   Disposition: From  - home             Planning for discharge in 1-2 days: to Home   Procedures:   No admission procedures for hospital encounter.   Antimicrobials:  Anti-infectives (From admission, onward)    Start     Dose/Rate Route Frequency Ordered Stop   02/19/24 2300  cefTRIAXone (ROCEPHIN) 1 g in sodium chloride  0.9 % 100 mL IVPB  Status:   Discontinued        1 g 200 mL/hr over 30 Minutes Intravenous Every 24 hours 02/19/24 0354 02/19/24 1702   02/19/24 2200  linezolid (ZYVOX) IVPB 600 mg       Placed in "And" Linked Group   600 mg 300 mL/hr over 60 Minutes Intravenous Every 12 hours 02/19/24 1702     02/19/24 1800  cefTRIAXone (ROCEPHIN) 2 g in sodium chloride  0.9 % 100 mL IVPB  Status:  Discontinued       Placed in "And" Linked Group   2 g 200 mL/hr over 30 Minutes Intravenous Once 02/19/24 1702 02/19/24 1702   02/19/24 1800  metroNIDAZOLE (FLAGYL) IVPB 500 mg  Status:  Discontinued       Placed in "And" Linked Group   500 mg 100 mL/hr over 60 Minutes Intravenous Every 8 hours 02/19/24 1702 02/19/24 1702   02/19/24 1800  linezolid (ZYVOX) IVPB 600 mg  Status:  Discontinued       Placed in "And" Linked Group   600 mg 300 mL/hr over 60 Minutes Intravenous  Once 02/19/24 1702 02/19/24 1702   02/19/24 1800  cefTRIAXone (ROCEPHIN) 2 g in sodium chloride  0.9 % 100 mL IVPB       Placed in "And" Linked Group   2 g 200 mL/hr over 30 Minutes Intravenous Every 24 hours 02/19/24 1702     02/19/24 1800  metroNIDAZOLE (FLAGYL) IVPB 500 mg       Placed in "And" Linked Group   500 mg 100 mL/hr over 60 Minutes Intravenous Every 8 hours 02/19/24 1702     02/18/24 2300  cefTRIAXone (ROCEPHIN) 2 g in sodium chloride  0.9 % 100 mL IVPB       Placed in "And" Linked Group   2 g 200 mL/hr over 30 Minutes Intravenous Once 02/18/24 2247 02/18/24 2342   02/18/24 2300  metroNIDAZOLE (FLAGYL) IVPB 500 mg       Placed in "And" Linked Group   500 mg 100 mL/hr over 60 Minutes Intravenous  Once 02/18/24 2247 02/19/24 0030   02/18/24 2300  linezolid (ZYVOX) IVPB 600 mg       Placed in "And" Linked Group   600 mg 300 mL/hr over 60 Minutes Intravenous  Once 02/18/24 2247 02/19/24 0153        Medication:   apixaban   5 mg Oral BID   DULoxetine   60 mg Oral BID   famotidine  20 mg Oral BID   hydrALAZINE  100 mg Oral BID   hydrOXYzine  25 mg  Oral QHS   ibuprofen  600 mg Oral TID   insulin  aspart  0-15 Units Subcutaneous TID WC   insulin  glargine-yfgn  15 Units Subcutaneous Q2200   irbesartan   150 mg Oral Daily   metoprolol  tartrate  25 mg Oral BID   rosuvastatin  40 mg Oral QHS    acetaminophen  **OR** acetaminophen , ondansetron  **OR** ondansetron  (ZOFRAN ) IV, oxyCODONE    Objective:  Vitals:   02/20/24 1259 02/20/24 2024 02/21/24 0355 02/21/24 0918  BP: 103/72 (!) 142/75 133/74 137/80  Pulse: 77 76 69 75  Resp: 20 20 18    Temp: 98.9 F (37.2 C) 98.1 F (36.7 C) 98.2 F (36.8 C)   TempSrc: Oral Oral Oral   SpO2: 99% 100% 95%   Weight:      Height:        Intake/Output Summary (Last 24 hours) at 02/21/2024 1224 Last data filed at 02/21/2024 0900 Gross per 24 hour  Intake 1720 ml  Output --  Net 1720 ml   Filed Weights   02/18/24 1601  Weight: 113 kg     Physical examination:   General:  AAO x 3,  cooperative, no distress;   HEENT:  Normocephalic, PERRL, otherwise with in Normal limits   Neuro:  CNII-XII intact. , normal motor and sensation, reflexes intact   Lungs:   Clear to auscultation BL, Respirations unlabored,  No wheezes / crackles  Cardio:    S1/S2, RRR, No murmure, No Rubs or Gallops   Abdomen:  Soft, non-tender, bowel sounds active all four quadrants, no guarding or peritoneal signs.  Muscular  skeletal:  Limited exam -global generalized weaknesses - in bed, able to move all 4 extremities,   2+ pulses,  symmetric, No pitting edema  Skin:  Dry, warm to touch, negative for any Rashes,  Wounds: Please see nursing documentation Left foot/ankle    -------------------------------------------------------------------------------------------------------------------------------    LABs:     Latest Ref Rng & Units 02/21/2024    3:48 AM 02/20/2024    7:48 AM 02/19/2024    3:16 AM  CBC  WBC 4.0 - 10.5 K/uL 9.1  9.1  10.4   Hemoglobin 12.0 - 15.0 g/dL 16.1  09.6  04.5   Hematocrit 36.0 - 46.0  % 33.8  34.5  35.0   Platelets 150 - 400 K/uL 219  206  185       Latest Ref Rng & Units 02/19/2024    3:16 AM 02/18/2024    6:35 PM 12/29/2023    4:17 PM  CMP  Glucose 70 - 99 mg/dL 409  811  914   BUN 6 - 20 mg/dL 19  21  20    Creatinine 0.44 - 1.00 mg/dL 7.82  9.56  2.13   Sodium 135 - 145 mmol/L 136  135  138   Potassium 3.5 - 5.1 mmol/L 4.0  3.8  4.1   Chloride 98 - 111 mmol/L 102  99  101   CO2 22 - 32 mmol/L 24  25  25    Calcium 8.9 - 10.3 mg/dL 8.9  9.5  9.4   Total Protein 6.5 - 8.1 g/dL   7.6   Total Bilirubin 0.0 - 1.2 mg/dL   0.6   Alkaline Phos 38 - 126 U/L   81   AST 15 - 41 U/L   24   ALT 0 - 44 U/L   20    Micro Results Recent Results (from the past 240 hours)  Blood Cultures x 2 sites     Status: None (Preliminary result)   Collection Time: 02/18/24 10:55 PM   Specimen: Left Antecubital; Blood  Result Value Ref Range Status   Specimen Description LEFT ANTECUBITAL  Final   Special Requests   Final    BOTTLES DRAWN AEROBIC AND ANAEROBIC Blood Culture adequate volume   Culture   Final    NO GROWTH 3 DAYS Performed at [facility]  [facility], [address] [place], [place] [address]    Report Status PENDING  Incomplete  Blood Cultures x 2 sites     Status: None (Preliminary result)   Collection Time: 02/18/24 11:08 PM   Specimen: BLOOD LEFT HAND  Result Value Ref Range Status   Specimen Description BLOOD LEFT HAND  Final   Special Requests   Final    BOTTLES DRAWN AEROBIC AND ANAEROBIC Blood Culture adequate volume   Culture   Final    NO GROWTH 3 DAYS Performed at [facility], [address], [place], [place] [address]    Report Status PENDING  Incomplete    Radiology Reports No results found.   SIGNED: Bobbetta Burnet, MD, FHM. FAAFP. Arlin Benes - [facility] Time spent - 55 min.  In seeing, evaluating and examining the patient. Reviewing medical records, labs, drawn plan of care. [facility],  Pager (please use [email or web address] to page/  text) Please use Epic Secure Chat for non-urgent communication (7AM-7PM)  If 7PM-7AM, please contact night-coverage [email or web address], 02/21/2024, 12:24 PM

## 2024-02-21 NOTE — Telephone Encounter (Signed)
Patient Product/process development scientist completed.    The patient is insured through [facility] MEDICAID.     Ran test claim for linezolid 600 mgand the current 14 day co-pay is $4.00.   This test claim was processed through [facility]- copay amounts may vary at other pharmacies due to pharmacy/plan contracts, or as the patient moves through the different stages of their insurance plan.     Roland Earl, CPHT Pharmacy Technician III Certified Patient Advocate [facility] Patient Advocate Team Direct Number: [phone]  Fax: [phone]

## 2024-02-22 NOTE — Telephone Encounter (Signed)
 Called pt and made an appt for 02/24/2024 with Dr. Julio Ohm at 10:45

## 2024-02-23 LAB — CULTURE, BLOOD (ROUTINE X 2)
Special Requests: ADEQUATE
Special Requests: ADEQUATE

## 2024-02-24 ENCOUNTER — Ambulatory Visit (INDEPENDENT_AMBULATORY_CARE_PROVIDER_SITE_OTHER): Admitting: Orthopedic Surgery

## 2024-02-24 ENCOUNTER — Inpatient Hospital Stay: Payer: Medicaid Other | Attending: Hematology and Oncology | Admitting: Hematology and Oncology

## 2024-02-24 DIAGNOSIS — E11610 Type 2 diabetes mellitus with diabetic neuropathic arthropathy: Secondary | ICD-10-CM | POA: Diagnosis not present

## 2024-02-24 DIAGNOSIS — Z86718 Personal history of other venous thrombosis and embolism: Secondary | ICD-10-CM | POA: Diagnosis not present

## 2024-02-24 DIAGNOSIS — I825Y2 Chronic embolism and thrombosis of unspecified deep veins of left proximal lower extremity: Secondary | ICD-10-CM

## 2024-02-24 DIAGNOSIS — Z7901 Long term (current) use of anticoagulants: Secondary | ICD-10-CM | POA: Diagnosis not present

## 2024-02-24 NOTE — Assessment & Plan Note (Signed)
 08/20/2023: Left leg ultrasound: Acute DVT posterior tibial veins at the ankle level  09/20/2023: Hypercoagulability workup: No lupus anticoagulant, Antithrombin III : 110%, protein S: 132%, protein C: 128%, factor V Leiden: Not detected prothrombin gene mutation: Not detected   Risk factors of the patient: Being in a boot from fracture for a long time as well as obesity.  Also family history    Duration of anticoagulation: 6 months.  Hospitalization 02/18/2024-02/21/2024: Cellulitis left foot (charcot foot)  We discussed the risks and benefits of continuing anticoagulation versus stopping it at this time.

## 2024-02-24 NOTE — Telephone Encounter (Signed)
 Per Abe Abed, forms was faxed back a couple of days ago. TY

## 2024-02-24 NOTE — Progress Notes (Signed)
 HEMATOLOGY-ONCOLOGY TELEPHONE VISIT PROGRESS NOTE  I connected with our patient on 02/24/24 at  3:30 PM EDT by telephone and verified that I am speaking with the correct person using two identifiers.  I discussed the limitations, risks, security and privacy concerns of performing an evaluation and management service by telephone and the availability of in person appointments.  I also discussed with the patient that there may be a patient responsible charge related to this service. The patient expressed understanding and agreed to proceed.   History of Present Illness:  Discussed the use of AI scribe software for clinical note transcription with the patient, who gave verbal consent to proceed.  History of Present Illness She is agreeable toFaith R Coffey is a 53 year old female who presents with a foot infection and ongoing leg issues.  She has been on blood thinners for six months and is currently taking Eliquis  due to ongoing leg issues. An ultrasound of her legs showed an enlarged area on the left side. She was instructed to stop taking meloxicam , methocarbamol, zinc , fish oil, and red yeast rice, but to continue with the blood thinner. She is currently on antibiotics for the foot infection and will continue this treatment for four weeks.    Oncology History   No history exists.    REVIEW OF SYSTEMS:   Constitutional: Denies fevers, chills or abnormal weight loss All other systems were reviewed with the patient and are negative. Observations/Objective:     Assessment Plan:  Chronic deep vein thrombosis (DVT) of left lower extremity (HCC) 08/20/2023: Left leg ultrasound: Acute DVT posterior tibial veins at the ankle level  09/20/2023: Hypercoagulability workup: No lupus anticoagulant, Antithrombin III : 110%, protein S: 132%, protein C: 128%, factor V Leiden: Not detected prothrombin gene mutation: Not detected   Risk factors of the patient: Being in a boot from fracture for a long time  as well as obesity.  Also family history    Duration of anticoagulation: long term (at least a year)  Hospitalization 02/18/2024-02/21/2024: Cellulitis left foot (charcot foot) 02/18/2024: Ultrasound lower extremities: No evidence of DVT, few enlarged lymph nodes in the inguinal area  We discussed the risks and benefits of continuing anticoagulation versus stopping it at this time. She is agreeable for anticoagulation for 6 more months. Telephone visit in 6 months and follow-up after the     I discussed the assessment and treatment plan with the patient. The patient was provided an opportunity to ask questions and all were answered. The patient agreed with the plan and demonstrated an understanding of the instructions. The patient was advised to call back or seek an in-person evaluation if the symptoms worsen or if the condition fails to improve as anticipated.   I provided 20 minutes of non-face-to-face time during this encounter.  This includes time for charting and coordination of care   Margert Sheerer, MD

## 2024-02-25 ENCOUNTER — Encounter: Payer: Self-pay | Admitting: Orthopedic Surgery

## 2024-02-25 ENCOUNTER — Telehealth: Payer: Self-pay | Admitting: Hematology and Oncology

## 2024-02-25 NOTE — Telephone Encounter (Signed)
 Confirmed with pt scheduled telephone appt date and time

## 2024-02-25 NOTE — Progress Notes (Signed)
 Office Visit Note   Patient: Alicia Coffey           Date of Birth: 23-Jul-1971           MRN: [id number] Visit Date: 02/24/2024              Requested by: Bari Boos, FNP [address] [place],  [place] [address] PCP: Bari Boos, FNP  Chief Complaint  Patient presents with   Left Leg - Follow-up      HPI: Patient is a 53 year old woman who was seen for initial evaluation for Charcot arthropathy left foot.  Patient states she initially noticed changes in her foot last year.  She is currently on doxycycline and cephalosporin.  She is on Eliquis .  Assessment & Plan: Visit Diagnoses:  1. Charcot joint of foot due to diabetes [facility])     Plan: Patient will proceed with having a Crow walker being fabricated.  Reevaluate for radiographs in 3 months.  Recommended a size extra-large knee-high compression sock.  Follow-Up Instructions: Return in about 3 months (around 05/26/2024).   Ortho Exam  Patient is alert, oriented, no adenopathy, well-dressed, normal affect, normal respiratory effort. Examination patient has a palpable pulse she has edema in her foot the foot is flat with no rocker-bottom deformity there is no cellulitis no redness no open ulcers no calluses.  There is no warmth to her foot.  Calf measures 42 cm in circumference.  Most recent hemoglobin A1c is 9.5.  Review of the radiographs shows extensive destruction of the navicular.  Review of the MRI scan shows fluid and edema no definite infection.  Imaging: No results found. No images are attached to the encounter.  Labs: Lab Results  Component Value Date   HGBA1C 9.5 (H) 02/19/2024   HGBA1C 7.5 (H) 08/23/2018   HGBA1C 8.8 (H) 11/24/2017   ESRSEDRATE 55 (H) 02/19/2024   ESRSEDRATE 60 (H) 02/18/2024   CRP 4.5 (H) 02/19/2024   CRP 5.1 (H) 02/18/2024   REPTSTATUS 02/23/2024 FINAL 02/18/2024   CULT  02/18/2024    NO GROWTH 5 DAYS Performed at [facility], [address] [place], [place]  [address]      Lab Results  Component Value Date   ALBUMIN 3.8 12/29/2023   ALBUMIN 3.2 (L) 09/21/2023   ALBUMIN 3.6 09/17/2015    No results found for: "MG" No results found for: "VD25OH"  No results found for: "PREALBUMIN"    Latest Ref Rng & Units 02/21/2024    3:48 AM 02/20/2024    7:48 AM 02/19/2024    3:16 AM  CBC EXTENDED  WBC 4.0 - 10.5 K/uL 9.1  9.1  10.4   RBC 3.87 - 5.11 MIL/uL 3.82  3.91  3.95   Hemoglobin 12.0 - 15.0 g/dL 84.1  66.0  63.0   HCT 36.0 - 46.0 % 33.8  34.5  35.0   Platelets 150 - 400 K/uL 219  206  185      There is no height or weight on file to calculate BMI.  Orders:  No orders of the defined types were placed in this encounter.  No orders of the defined types were placed in this encounter.    Procedures: No procedures performed  Clinical Data: No additional findings.  ROS:  All other systems negative, except as noted in the HPI. Review of Systems  Objective: Vital Signs: There were no vitals taken for this visit.  Specialty Comments:  No specialty comments available.  PMFS History: Patient Active Problem List   Diagnosis Date Noted   Type 2 diabetes mellitus with hyperlipidemia (HCC) 02/19/2024   Chronic deep vein thrombosis (DVT) of left lower extremity (HCC) 02/19/2024   Osteomyelitis (HCC) 02/18/2024   Cellulitis of left ankle 02/18/2024   Leg DVT (deep venous thromboembolism), acute, left (HCC) 09/20/2023   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   Morbid obesity due to excess calories (HCC) 10/09/2022   Tired 05/20/2022   History of breast cancer 05/20/2022   Meralgia paraesthetica 08/10/2021   Encounter for gynecological examination with Papanicolaou smear of cervix 04/22/2021   Encounter for screening fecal occult blood testing 04/22/2021   Sweats, menopausal 03/24/2021   Hot flashes 03/24/2021   Weakness 03/24/2021   Dyspnea on exertion 03/24/2021   Perimenopause 03/24/2021   Degeneration of lumbosacral  intervertebral disc 07/31/2020   Congenital pes planus 07/31/2020   Encounter for orthopedic follow-up care 12/14/2019   Acquired trigger finger of left ring finger 11/15/2019   Digital mucinous cyst of finger 11/15/2019   Triggering of finger 10/17/2019   Other obesity due to excess calories 07/19/2019   Chronic kidney disease due to type 2 diabetes mellitus (HCC) 07/19/2019   Secondary hyperparathyroidism (HCC) 07/19/2019   Vitamin D deficiency 07/19/2019   Pain in finger of right hand 06/15/2017   Sprain of interphalangeal joint of right ring finger 06/15/2017   Diabetes 1.5, 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   Idiopathic peripheral neuropathy 09/21/2016   Obesity, class 3 09/21/2016   Obstructive sleep apnea of adult 09/21/2016   Oropharyngeal dysphagia 09/21/2016   Vertigo 09/21/2016   Menorrhagia with irregular cycle 07/25/2014   Iron deficiency anemia due to chronic blood loss 07/25/2014   Encounter for follow-up 05/23/2014   Fibroid, uterine 04/04/2014   Abnormal uterine bleeding (AUB) 03/26/2014   Diabetic peripheral neuropathy (HCC) 12/21/2013   Low back pain 12/11/2013   Pain in joint, shoulder region 12/11/2013   Pain in limb 12/11/2013   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   Morbid obesity with BMI of 40.0-44.9, adult (HCC)     Obstructive sleep apnea of adult 2017   Oropharyngeal dysphagia    Peripheral neuropathy    Proteinuria    Secondary hyperparathyroidism (HCC)    Shortness of breath on exertion    Type 2 diabetes mellitus (HCC)    Uterine fibroid    Vertigo 2015   Vitamin D deficiency    Wears dentures     Family History  Problem Relation Age of Onset   Diabetes Mother    Heart disease Mother    Hypertension Mother    Diabetes Father    Heart disease Father    Hypertension Father    Heart attack  Father    Diabetes Sister    Fibromyalgia Sister    Diabetes Sister    Diabetes Brother    Heart attack Maternal Grandmother        <35   Heart attack Maternal Grandfather    Breast cancer Paternal Grandmother 66       breast; dbl mastectomy   Heart attack Paternal Grandfather    Colon polyps Neg Hx    Colon cancer Neg Hx    Crohn's disease Neg Hx    Esophageal cancer Neg Hx    Rectal cancer Neg Hx    Stomach cancer Neg Hx     Past Surgical History:  Procedure Laterality Date   ABLATION     uterine    BREAST BIOPSY Left 2014   benign   BREAST BIOPSY Right 2013   high risk   BREAST BIOPSY Left 2021   benign   BREAST BIOPSY Left 2021   benign   BREAST BIOPSY Right 07/22/2023   US  RT BREAST BX W LOC DEV 1ST LESION IMG BX SPEC US  GUIDE 07/22/2023 [facility]   BREAST EXCISIONAL BIOPSY Left    BREAST LUMPECTOMY Left 2021   LCIS   BREAST LUMPECTOMY WITH NEEDLE LOCALIZATION  08/15/2012   Procedure: BREAST LUMPECTOMY WITH NEEDLE LOCALIZATION;  Surgeon: Darcella Earnest, MD;  Location: [facility];  Service: General;  Laterality: Right;  Wire localizations Right breast calcifications   BREAST LUMPECTOMY WITH RADIOACTIVE SEED LOCALIZATION Left 01/30/2020   Procedure: LEFT BREAST LUMPECTOMY X 2 WITH RADIOACTIVE SEED LOCALIZATION;  Surgeon: Oza Blumenthal, MD;  Location: [facility];  Service: General;  Laterality: Left;   CARPAL TUNNEL RELEASE Right  11/24/2017   Procedure: RIGHT CARPAL TUNNEL RELEASE;  Surgeon: Audie Bleacher, MD;  Location: [facility];  Service: Neurosurgery;  Laterality: Right;  Right CARPAL TUNNEL RELEASE   CARPAL TUNNEL RELEASE Left 08/31/2018   Procedure: LEFT CARPAL TUNNEL RELEASE;  Surgeon: Audie Bleacher, MD;  Location: [facility];  Service: Neurosurgery;  Laterality: Left;   DILITATION & CURRETTAGE/HYSTROSCOPY WITH THERMACHOICE ABLATION N/A 08/21/2014   Procedure: DILATATION & CURETTAGE/HYSTEROSCOPY WITH THERMACHOICE ABLATION;  Surgeon: Albino Hum, MD;  Location: [facility];  Service: Gynecology;  Laterality: N/A;   KNEE ARTHROSCOPY Left 10/16/2015   Procedure: ARTHROSCOPY KNEE with debridment;  Surgeon: Wendolyn Hamburger, MD;  Location: [facility];  Service: Orthopedics;  Laterality: Left;   KNEE ARTHROSCOPY WITH MEDIAL MENISECTOMY Right 07/17/2015   Procedure: KNEE ARTHROSCOPY WITH MEDIAL MENISECTOMY;  Surgeon: Wendolyn Hamburger, MD;  Location:  SURGERY CENTER;  Service: Orthopedics;  Laterality: Right;   POLYPECTOMY N/A 08/21/2014   Procedure: ENDOMETRIAL POLYPECTOMY;  Surgeon: Albino Hum, MD;  Location: [facility];  Service: Gynecology;  Laterality: N/A;   REFRACTIVE SURGERY Right    micro aneuysms   TRIGGER FINGER RELEASE Left    ULNAR NERVE TRANSPOSITION Right 11/24/2017   Procedure: RIGHT ULNAR NERVE DECOMPRESSION/TRANSPOSITION;  Surgeon: Audie Bleacher, MD;  Location: [facility];  Service: Neurosurgery;  Laterality: Right;  Right ULNAR NERVE DECOMPRESSION/TRANSPOSITION   Social History   Occupational History   Occupation: care giver    Employer: [employer]  Tobacco Use   Smoking status: Never    Passive exposure: Never   Smokeless tobacco: Never  Vaping Use   Vaping status: Never Used  Substance and Sexual Activity   Alcohol use: No   Drug use: No   Sexual activity: Not on file

## 2024-03-14 ENCOUNTER — Ambulatory Visit: Admitting: Internal Medicine

## 2024-03-14 ENCOUNTER — Encounter: Payer: Self-pay | Admitting: Internal Medicine

## 2024-03-14 ENCOUNTER — Ambulatory Visit
Admission: RE | Admit: 2024-03-14 | Discharge: 2024-03-14 | Disposition: A | Discharge status: Home / Self Care | Source: Ambulatory Visit | Attending: Internal Medicine

## 2024-03-14 ENCOUNTER — Other Ambulatory Visit: Payer: Self-pay

## 2024-03-14 VITALS — BP 127/83 | HR 79 | Temp 97.8°F

## 2024-03-14 DIAGNOSIS — L02619 Cutaneous abscess of unspecified foot: Secondary | ICD-10-CM

## 2024-03-14 MED ORDER — DOXYCYCLINE HYCLATE 100 MG PO TABS: 100.0000 mg | ORAL_TABLET | Freq: Two times a day (BID) | ORAL | 0 refills | Status: AC

## 2024-03-14 MED ORDER — CEFADROXIL 500 MG PO CAPS: 1000.0000 mg | ORAL_CAPSULE | Freq: Two times a day (BID) | ORAL | 0 refills | Status: AC

## 2024-03-14 NOTE — Progress Notes (Signed)
 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) 02/19/2024   Osteomyelitis (HCC) 02/18/2024   Cellulitis of left ankle 02/18/2024   Leg DVT (deep venous thromboembolism), acute, left (HCC) 09/20/2023   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   Morbid obesity due to excess calories (HCC) 10/09/2022   Tired 05/20/2022   History of breast cancer 05/20/2022   Meralgia paraesthetica 08/10/2021   Encounter for gynecological examination with Papanicolaou smear of cervix 04/22/2021   Encounter for screening fecal occult blood testing 04/22/2021   Sweats, menopausal 03/24/2021   Hot flashes 03/24/2021   Weakness 03/24/2021   Dyspnea on exertion 03/24/2021   Perimenopause 03/24/2021   Degeneration of lumbosacral intervertebral disc 07/31/2020   Congenital pes planus 07/31/2020   Encounter for orthopedic follow-up care 12/14/2019   Acquired trigger finger of left ring finger 11/15/2019   Digital mucinous cyst of finger 11/15/2019   Triggering of finger 10/17/2019   Other obesity due to excess calories 07/19/2019   Chronic kidney disease due to type 2 diabetes mellitus (HCC) 07/19/2019   Secondary hyperparathyroidism (HCC) 07/19/2019   Vitamin D deficiency 07/19/2019   Pain in finger of right hand 06/15/2017   Sprain of interphalangeal joint of right ring finger 06/15/2017   Diabetes 1.5, 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   Idiopathic peripheral neuropathy 09/21/2016   Obesity, class 3 09/21/2016   Obstructive sleep apnea of adult 09/21/2016   Oropharyngeal dysphagia 09/21/2016   Vertigo 09/21/2016   Menorrhagia with irregular cycle 07/25/2014   Iron deficiency anemia due to chronic blood loss  07/25/2014   Encounter for follow-up 05/23/2014   Fibroid, uterine 04/04/2014   Abnormal uterine bleeding (AUB) 03/26/2014   Diabetic peripheral neuropathy (HCC) 12/21/2013   Low back pain 12/11/2013   Pain in joint, shoulder region 12/11/2013   Pain in limb 12/11/2013   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 F with with past medical history of cough, CKD, diabetes type 2, left foot pain presents for hospital follow-up of left foot abscess, septic arthritis and osteomyelitis.  Patient had left foot pain on and off for about a year.  Noted in the last 2 weeks pain in erythema worsened.  Has started on broad-spectrum cefazolin , linezolid , metronidazole .  MRI left foot showed Large fluid collection in the midfoot concerning for septic arthritis, rim-enhancing fluid collection concerning for abscess.  IR engaged for possible abscess fluid collection and ankle collection.  Collection could not be aspirated.  Patient discharged on cefadroxil  and doxycycline  for about a month with repeat imaging at that time. Today: Pt states she slipped today and came down on her left fooot.  Priro to the fass foot was still painful.  Tolerating antibiotics Review of Systems  All other systems reviewed and are negative.   Past Medical History:  Diagnosis Date   Anemia    Anxiety    Arthritis    Right knee   CKD (chronic kidney disease) stage 2, GFR 60-89 ml/min  Dr. 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     albuterol (PROVENTIL HFA;VENTOLIN HFA) 108 (90 Base) MCG/ACT inhaler Inhale 2 puffs into the lungs every 6 (six) hours as needed for wheezing or shortness of breath.     apixaban  (ELIQUIS ) 5 MG TABS tablet Take 1 tablet (5 mg total) by mouth 2 (two) times daily. 60 tablet 4   aspirin  EC 81 MG tablet Take 81 mg by mouth at bedtime. Swallow whole.     B Complex-C-Folic Acid  (B-COMPLEX BALANCED PO) Take 1 tablet by mouth daily at 12 noon.     bumetanide (BUMEX) 0.5 MG tablet Take 0.25 mg by mouth 2 (two) times daily.     cefadroxil  (DURICEF) 500 MG capsule Take 2 capsules (1,000 mg total) by mouth 2 (two) times daily. 120 capsule 0   cholecalciferol (VITAMIN D3) 25 MCG (1000 UNIT) tablet Take 1,000 Units by mouth daily at 12 noon.     ciclopirox (LOPROX) 0.77 % cream Apply 1 application topically 2 (two) times daily as needed (itching).     CRANBERRY FRUIT PO Take 1 capsule by mouth daily at 12 noon.     Docusate Sodium (DSS) 100 MG CAPS Take 1 capsule by mouth 2 (two) times daily.     doxycycline  (VIBRA -TABS) 100 MG tablet Take 1 tablet (100 mg total) by mouth every 12 (twelve) hours. 60 tablet 0   DULoxetine  (CYMBALTA ) 60 MG capsule Take 1 capsule (60 mg total) by mouth daily. (Patient taking differently: Take 60 mg by mouth 2 (two) times daily.) 30 capsule 12   Empagliflozin-metFORMIN HCl ER (SYNJARDY XR) 25-1000 MG TB24 Take 1 tablet by mouth at bedtime.      Finerenone (KERENDIA) 10 MG TABS Take 10 mg by mouth every Monday, Wednesday, and Friday. On Monday, Wednesday, & Friday     [facility] MAGNESIUM OXIDE 250 MG TABS Take 1 tablet by mouth daily.     hydrALAZINE  (APRESOLINE ) 100 MG tablet Take 100 mg by mouth 2 (two) times daily.     hydrocortisone  cream 1 % Apply to affected area 2 times daily (Patient taking differently: as needed for itching. Apply to affected area 2 times daily) 15 g 0   hydrOXYzine  (ATARAX /VISTARIL ) 25 MG tablet Take 25 mg by mouth daily at 12 noon.     insulin  glargine (LANTUS ) 100 UNIT/ML injection Inject 40 Units into the skin at bedtime.     irbesartan  (AVAPRO ) 150 MG tablet TAKE 1 TABLET BY MOUTH EVERY DAY (  Patient taking differently: Take 150 mg by mouth at bedtime.) 30 tablet 11   lactobacillus acidophilus & bulgar (LACTINEX) chewable tablet Chew 1 tablet by mouth 3 (three) times daily with meals. 90 tablet 0   metoprolol  tartrate (LOPRESSOR ) 25 MG tablet Take 25 mg by mouth 2 (two) times daily.     NOVOLOG  FLEXPEN 100 UNIT/ML FlexPen Inject 2-15 Units into the skin 3 (three) times daily with meals. Per sliding scale for levels that exceeds 121-150.     OZEMPIC, 1 MG/DOSE, 4 MG/3ML SOPN Inject 1 mg into the skin every Sunday.     POLY-IRON 150 150 MG capsule Take 150 mg by mouth 2 (two) times daily.     rosuvastatin  (CRESTOR ) 40 MG tablet Take 40 mg by mouth at bedtime.      traMADol (ULTRAM) 50 MG tablet Take 50 mg by mouth every morning.     No facility-administered medications prior to visit.     Allergies  Allergen Reactions   Penicillins Other (See Comments)    PATIENT HAS HAD A PCN REACTION WITH IMMEDIATE RASH, FACIAL/TONGUE/THROAT SWELLING, SOB, OR LIGHTHEADEDNESS WITH HYPOTENSION:  #  #  YES  #  #  Has patient had a PCN reaction causing severe rash involving mucus membranes or skin necrosis: No Has patient had a PCN reaction that required hospitalization No Has patient had a PCN reaction occurring within the last  10 years: No    Bee Venom Dermatitis, Itching and Other (See Comments)   Latex    Other Hives, Itching and Rash    Powder in gloves Arthropod Insect    Social History   Tobacco Use   Smoking status: Never    Passive exposure: Never   Smokeless tobacco: Never  Vaping Use   Vaping status: Never Used  Substance Use Topics   Alcohol use: No   Drug use: No    Family History  Problem Relation Age of Onset   Diabetes Mother    Heart disease Mother    Hypertension Mother    Diabetes Father    Heart disease Father    Hypertension Father    Heart attack Father    Diabetes Sister    Fibromyalgia Sister    Diabetes Sister    Diabetes Brother    Heart attack Maternal Grandmother        <35   Heart attack Maternal Grandfather    Breast cancer Paternal Grandmother 6       breast; dbl mastectomy   Heart attack Paternal Grandfather    Colon polyps Neg Hx    Colon cancer Neg Hx    Crohn's disease Neg Hx    Esophageal cancer Neg Hx    Rectal cancer Neg Hx    Stomach cancer Neg Hx     Objective:  There were no vitals filed for this visit. There is no height or weight on file to calculate BMI.  Physical Exam Constitutional:      Appearance: Normal appearance.  HENT:     Head: Normocephalic and atraumatic.     Right Ear: Tympanic membrane normal.     Left Ear: Tympanic membrane normal.     Nose: Nose normal.     Mouth/Throat:     Mouth: Mucous membranes are moist.  Eyes:     Extraocular Movements: Extraocular movements intact.     Conjunctiva/sclera: Conjunctivae normal.     Pupils: Pupils are equal, round, and reactive to light.  Cardiovascular:  Rate and Rhythm: Normal rate and regular rhythm.     Heart sounds: No murmur heard.    No friction rub. No gallop.  Pulmonary:     Effort: Pulmonary effort is normal.     Breath sounds: Normal breath sounds.  Abdominal:     General: Abdomen is flat.     Palpations: Abdomen is soft.  Skin:    General: Skin is warm  and dry.  Neurological:     General: No focal deficit present.     Mental Status: She is alert and oriented to person, place, and time.  Psychiatric:        Mood and Affect: Mood normal.          Lab Results: Lab Results  Component Value Date   WBC 9.1 02/21/2024   HGB 10.5 (L) 02/21/2024   HCT 33.8 (L) 02/21/2024   MCV 88.5 02/21/2024   PLT 219 02/21/2024    Lab Results  Component Value Date   CREATININE 0.85 02/19/2024   BUN 19 02/19/2024   NA 136 02/19/2024   K 4.0 02/19/2024   CL 102 02/19/2024   CO2 24 02/19/2024    Lab Results  Component Value Date   ALT 20 12/29/2023   AST 24 12/29/2023   ALKPHOS 81 12/29/2023   BILITOT 0.6 12/29/2023     Assessment & Plan:  #Concern for left foot abscess, septic arthritis, with concern for osteomyelitis in the setting of Charcot arthropathy #Penicillin allergy with rash(as a teenager) - MRI showed left foot abscess, septic arthritis, possible osteomyelitis.  IR could not aspirate abscess. - She was discharged on cefadroxil  and doxycycline  for about a month Plan: - Labs today - Recent fall following hospitalization where she slipped and landed on on left foot, foot more painful today.  Prior to the fall she said her foot was starting to feel better, swollen now.  Will get x-ray left foot - Continue antibiotics till follow-up in 6/26.  Discussed fever precautions.    Orlie Bjornstad, MD [facility]   03/14/24  10:06 AM I have personally spent 42 minutes involved in face-to-face and non-face-to-face activities for this patient on the day of the visit. Professional time spent includes the following activities: Preparing to see the patient (review of tests), Obtaining and/or reviewing separately obtained history (admission/discharge record), Performing a medically appropriate examination and/or evaluation , Ordering medications/tests/procedures, referring and  communicating with other health care professionals, Documenting clinical information in the EMR, Independently interpreting results (not separately reported), Communicating results to the patient/family/caregiver, Counseling and educating the patient/family/caregiver and Care coordination (not separately reported).

## 2024-03-15 LAB — COMPLETE METABOLIC PANEL WITHOUT GFR
AG Ratio: 1.3 (calc) (ref 1.0–2.5)
ALT: 13 U/L (ref 6–29)
AST: 18 U/L (ref 10–35)
Albumin: 3.8 g/dL (ref 3.6–5.1)
Alkaline phosphatase (APISO): 101 U/L (ref 37–153)
BUN/Creatinine Ratio: 23 (calc) — ABNORMAL HIGH (ref 6–22)
BUN: 26 mg/dL — ABNORMAL HIGH (ref 7–25)
CO2: 27 mmol/L (ref 20–32)
Calcium: 9.5 mg/dL (ref 8.6–10.4)
Chloride: 102 mmol/L (ref 98–110)
Creat: 1.14 mg/dL — ABNORMAL HIGH (ref 0.50–1.03)
Globulin: 3 g/dL (ref 1.9–3.7)
Glucose, Bld: 207 mg/dL — ABNORMAL HIGH (ref 65–99)
Potassium: 4.4 mmol/L (ref 3.5–5.3)
Sodium: 140 mmol/L (ref 135–146)
Total Bilirubin: 0.4 mg/dL (ref 0.2–1.2)
Total Protein: 6.8 g/dL (ref 6.1–8.1)

## 2024-03-15 LAB — CBC WITH DIFFERENTIAL/PLATELET
Absolute Lymphocytes: 2654 {cells}/uL (ref 850–3900)
Absolute Monocytes: 694 {cells}/uL (ref 200–950)
Basophils Absolute: 45 {cells}/uL (ref 0–200)
Basophils Relative: 0.4 %
Eosinophils Absolute: 795 {cells}/uL — ABNORMAL HIGH (ref 15–500)
Eosinophils Relative: 7.1 %
HCT: 36.1 % (ref 35.0–45.0)
Hemoglobin: 11.4 g/dL — ABNORMAL LOW (ref 11.7–15.5)
MCH: 27.7 pg (ref 27.0–33.0)
MCHC: 31.6 g/dL — ABNORMAL LOW (ref 32.0–36.0)
MCV: 87.8 fL (ref 80.0–100.0)
MPV: 10.4 fL (ref 7.5–12.5)
Monocytes Relative: 6.2 %
Neutro Abs: 7011 {cells}/uL (ref 1500–7800)
Neutrophils Relative %: 62.6 %
Platelets: 312 10*3/uL (ref 140–400)
RBC: 4.11 10*6/uL (ref 3.80–5.10)
RDW: 14.4 % (ref 11.0–15.0)
Total Lymphocyte: 23.7 %
WBC: 11.2 10*3/uL — ABNORMAL HIGH (ref 3.8–10.8)

## 2024-03-15 LAB — SEDIMENTATION RATE: Sed Rate: 51 mm/h — ABNORMAL HIGH (ref 0–30)

## 2024-03-15 LAB — C-REACTIVE PROTEIN: CRP: 4.4 mg/L (ref <8.0)

## 2024-04-06 ENCOUNTER — Ambulatory Visit (INDEPENDENT_AMBULATORY_CARE_PROVIDER_SITE_OTHER): Payer: Self-pay | Admitting: Internal Medicine

## 2024-04-06 ENCOUNTER — Other Ambulatory Visit: Payer: Self-pay

## 2024-04-06 ENCOUNTER — Encounter (HOSPITAL_COMMUNITY): Payer: Self-pay | Admitting: Emergency Medicine

## 2024-04-06 ENCOUNTER — Emergency Department (HOSPITAL_COMMUNITY)

## 2024-04-06 ENCOUNTER — Emergency Department (HOSPITAL_COMMUNITY)
Admission: EM | Admit: 2024-04-06 | Discharge: 2024-04-06 | Disposition: A | Discharge status: Home / Self Care | Attending: Emergency Medicine | Admitting: Emergency Medicine

## 2024-04-06 VITALS — BP 134/84 | HR 105 | Temp 98.4°F | Wt 242.0 lb

## 2024-04-06 DIAGNOSIS — Z7901 Long term (current) use of anticoagulants: Secondary | ICD-10-CM | POA: Insufficient documentation

## 2024-04-06 DIAGNOSIS — Z794 Long term (current) use of insulin: Secondary | ICD-10-CM | POA: Diagnosis not present

## 2024-04-06 DIAGNOSIS — M79672 Pain in left foot: Secondary | ICD-10-CM | POA: Diagnosis present

## 2024-04-06 DIAGNOSIS — Z79899 Other long term (current) drug therapy: Secondary | ICD-10-CM | POA: Diagnosis not present

## 2024-04-06 DIAGNOSIS — M7989 Other specified soft tissue disorders: Secondary | ICD-10-CM | POA: Insufficient documentation

## 2024-04-06 DIAGNOSIS — Z7982 Long term (current) use of aspirin: Secondary | ICD-10-CM | POA: Insufficient documentation

## 2024-04-06 DIAGNOSIS — E119 Type 2 diabetes mellitus without complications: Secondary | ICD-10-CM | POA: Diagnosis not present

## 2024-04-06 DIAGNOSIS — I10 Essential (primary) hypertension: Secondary | ICD-10-CM | POA: Diagnosis not present

## 2024-04-06 DIAGNOSIS — Z91040 Latex allergy status: Secondary | ICD-10-CM | POA: Diagnosis not present

## 2024-04-06 DIAGNOSIS — L02619 Cutaneous abscess of unspecified foot: Secondary | ICD-10-CM | POA: Diagnosis present

## 2024-04-06 LAB — CBC WITH DIFFERENTIAL/PLATELET
Abs Immature Granulocytes: 0.04 10*3/uL (ref 0.00–0.07)
Basophils Absolute: 0.1 10*3/uL (ref 0.0–0.1)
Basophils Relative: 1 %
Eosinophils Absolute: 1 10*3/uL — ABNORMAL HIGH (ref 0.0–0.5)
Eosinophils Relative: 10 %
HCT: 41 % (ref 36.0–46.0)
Hemoglobin: 12.7 g/dL (ref 12.0–15.0)
Immature Granulocytes: 0 %
Lymphocytes Relative: 18 %
Lymphs Abs: 2 10*3/uL (ref 0.7–4.0)
MCH: 27.7 pg (ref 26.0–34.0)
MCHC: 31 g/dL (ref 30.0–36.0)
MCV: 89.3 fL (ref 80.0–100.0)
Monocytes Absolute: 0.6 10*3/uL (ref 0.1–1.0)
Monocytes Relative: 6 %
Neutro Abs: 7.1 10*3/uL (ref 1.7–7.7)
Neutrophils Relative %: 65 %
Platelets: 218 10*3/uL (ref 150–400)
RBC: 4.59 MIL/uL (ref 3.87–5.11)
RDW: 14.2 % (ref 11.5–15.5)
WBC: 10.8 10*3/uL — ABNORMAL HIGH (ref 4.0–10.5)
nRBC: 0 % (ref 0.0–0.2)

## 2024-04-06 LAB — COMPREHENSIVE METABOLIC PANEL WITH GFR
ALT: 13 U/L (ref 0–44)
AST: 18 U/L (ref 15–41)
Albumin: 3.9 g/dL (ref 3.5–5.0)
Alkaline Phosphatase: 91 U/L (ref 38–126)
Anion gap: 13 (ref 5–15)
BUN: 18 mg/dL (ref 6–20)
CO2: 26 mmol/L (ref 22–32)
Calcium: 9.2 mg/dL (ref 8.9–10.3)
Chloride: 100 mmol/L (ref 98–111)
Creatinine, Ser: 1.19 mg/dL — ABNORMAL HIGH (ref 0.44–1.00)
GFR, Estimated: 54 mL/min — ABNORMAL LOW (ref >60)
Glucose, Bld: 175 mg/dL — ABNORMAL HIGH (ref 70–99)
Potassium: 3.6 mmol/L (ref 3.5–5.1)
Sodium: 139 mmol/L (ref 135–145)
Total Bilirubin: 0.8 mg/dL (ref 0.0–1.2)
Total Protein: 7.8 g/dL (ref 6.5–8.1)

## 2024-04-06 LAB — I-STAT CG4 LACTIC ACID, ED: Lactic Acid, Venous: 0.8 mmol/L (ref 0.5–1.9)

## 2024-04-06 MED ORDER — GADOBUTROL 1 MMOL/ML IV SOLN
10.0000 mL | Freq: Once | INTRAVENOUS | Status: AC | PRN
  Administered 2024-04-06: 10 mL via INTRAVENOUS

## 2024-04-06 NOTE — Discharge Instructions (Addendum)
 Please follow-up with your infectious disease doctor at your earliest convenience.  If you experience any worsening symptoms please return to the emergency department.

## 2024-04-06 NOTE — Patient Instructions (Signed)
 Please go to ED for MRI left foot and blood Cx

## 2024-04-06 NOTE — ED Provider Notes (Signed)
 Physical Exam  BP (!) 145/81 (BP Location: Left Arm)   Pulse 82   Temp 98.2 F (36.8 C) (Oral)   Resp 14   SpO2 98%   Physical Exam Vitals and nursing note reviewed.  Constitutional:      Appearance: Normal appearance.  HENT:     Head: Normocephalic and atraumatic.     Nose: Nose normal.   Cardiovascular:     Rate and Rhythm: Normal rate.  Pulmonary:     Effort: Pulmonary effort is normal.  Abdominal:     General: Abdomen is flat.     Palpations: Abdomen is soft.   Musculoskeletal:        General: Swelling present.     Cervical back: Normal range of motion and neck supple.     Comments: Mild swelling noted to the left foot, some warmth noted, no streaking in the skin, no changes consistent with cellulitis.   Skin:    General: Skin is warm and dry.   Neurological:     Mental Status: She is alert and oriented to person, place, and time.     Procedures  Procedures  ED Course / MDM    Medical Decision Making Amount and/or Complexity of Data Reviewed Labs: ordered. Radiology: ordered.  Risk Prescription drug management.    Patient care assumed from Brandon P PA at shift change, please see his note for full HPI.  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  Recently hospitalized for IV antibiotics, completed course of outpatient antibiotics.  Now has some swelling and redness with send here today for MRI to further evaluate any worsening osteomyelitis.    I reviewed the prior note from infectious disease from Dr. Dennise who reported due to elevated white blood cell count, along with prior abscess not improving after antibiotics was sent to the ED for further MRI.  MRI LEFT FOOT: 1. Extensive destruction and collapse of much of the hindfoot and  midfoot compatible with extensive Charcot arthropathy.  2. Abnormal complex fluid collection extending around the expected  location of the Chopart joint, remaining  talar fragment, and  subtalar joint. This collection has enhancing margins especially  anteriorly. I do not see definite extension of this fluid collection  to the cutaneous surface although if there is drainage, that would  be an indicator of infection.  3. Hypoenhancement of the remaining portion of the talar body  suggesting relative bony ischemia, although potentially viable given  the presence of high precontrast T1 signal in the marrow.  4. Scattered marrow edema in all of these bony structures and  involving the cuboid and cuneiforms, mostly sparing the metatarsal  bases although with low-grade edema in the fifth metatarsal base.  5. Torn peroneus brevis. Tendinopathy of the peroneus longus below  the Lisfranc joint.  6. Substantial tibialis posterior tendinopathy.  7. Mild flexor hallucis longus tenosynovitis.  8. Mildly thickened medial band of the plantar fascia.  9. Substantial subcutaneous edema medially and laterally along the  affected portion of the ankle and midfoot. Substantial dorsal  subcutaneous edema in the forefoot, cellulitis not excluded.  10. Moderate regional muscular atrophy.   7:16 PM spoke to podiatry, who reviewed case.  They do not feel that patient warrants admission at this time as patient is hemodynamically stable, with no visible wound on my exam.  It does appear that the MRI findings are consistent with likely Charcot foot.  Will call infectious disease in order  to obtain any further recommendations as patient was under the impression that she was coming in for IV antibiotics.  8:36 PM Spoke to Dr. Dea, infectious disease who review the chart and as noted no signs of cellulitis on my exam.  Patient does not need admission at this time.  Can follow-up outpatient with them.  Patient and I discussed this plan at length, she is agreeable of discharge at this time.  She is hemodynamically stable for discharge.   Portions of this note were generated with  Scientist, clinical (histocompatibility and immunogenetics). Dictation errors may occur despite best attempts at proofreading.         Alicia Klecker, PA-C 04/06/24 2043    Dean Clarity, MD 04/06/24 2046

## 2024-04-06 NOTE — ED Triage Notes (Signed)
 Pt sent from infectious disease Dr. due to possible charcot foot. PT was sent for MRI. Pt unsure about fevers.

## 2024-04-06 NOTE — Progress Notes (Signed)
 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) 02/19/2024   Osteomyelitis (HCC) 02/18/2024   Cellulitis of left ankle 02/18/2024   Leg DVT (deep venous thromboembolism), acute, left (HCC) 09/20/2023   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   Morbid obesity due to excess calories (HCC) 10/09/2022   Tired 05/20/2022   History of breast cancer 05/20/2022   Meralgia paraesthetica 08/10/2021   Encounter for gynecological examination with Papanicolaou smear of cervix 04/22/2021   Encounter for screening fecal occult blood testing 04/22/2021   Sweats, menopausal 03/24/2021   Hot flashes 03/24/2021   Weakness 03/24/2021   Dyspnea on exertion 03/24/2021   Perimenopause 03/24/2021   Degeneration of lumbosacral intervertebral disc 07/31/2020   Congenital pes planus 07/31/2020   Encounter for orthopedic follow-up care 12/14/2019   Acquired trigger finger of left ring finger 11/15/2019   Digital mucinous cyst of finger 11/15/2019   Triggering of finger 10/17/2019   Other obesity due to excess calories 07/19/2019   Chronic kidney disease due to type 2 diabetes mellitus (HCC) 07/19/2019   Secondary hyperparathyroidism (HCC) 07/19/2019   Vitamin D deficiency 07/19/2019   Pain in finger of right hand 06/15/2017   Sprain of interphalangeal joint of right ring finger 06/15/2017   Diabetes 1.5, 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   Idiopathic peripheral neuropathy 09/21/2016   Obesity, class 3 09/21/2016   Obstructive sleep apnea of adult 09/21/2016   Oropharyngeal dysphagia 09/21/2016   Vertigo 09/21/2016   Menorrhagia with irregular cycle 07/25/2014   Iron deficiency anemia due to chronic blood loss 07/25/2014   Encounter for follow-up 05/23/2014    Fibroid, uterine 04/04/2014   Abnormal uterine bleeding (AUB) 03/26/2014   Diabetic peripheral neuropathy (HCC) 12/21/2013   Low back pain 12/11/2013   Pain in joint, shoulder region 12/11/2013   Pain in limb 12/11/2013   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 F with with past medical history of cough, CKD, diabetes type 2, left foot pain presents for hospital follow-up of left foot abscess, septic arthritis and osteomyelitis.  Patient had left foot pain on and off for about a year.  Noted in the last 2 weeks pain in erythema worsened.  Has started on broad-spectrum cefazolin , linezolid , metronidazole .  MRI left foot showed Large fluid collection in the midfoot concerning for septic arthritis, rim-enhancing fluid collection concerning for abscess.  IR engaged for possible abscess fluid collection and ankle collection.  Collection could not be aspirated.  Patient discharged on cefadroxil  and doxycycline  for about a month with repeat imaging at that time. 03/14/24: Pt states she slipped today and came down on her left fooot.  Priro to the fass foot was still painful.  Tolerating antibiotics Today:Left foot selling persists. Pt states its warm but not noticeably tender, although uncomfortable.  Review of Systems  All other systems reviewed and are negative.   Past Medical History:  Diagnosis Date   Anemia    Anxiety    Arthritis    Right knee   CKD (chronic kidney disease) stage  2, GFR 60-89 ml/min    Dr. 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     albuterol (PROVENTIL HFA;VENTOLIN HFA) 108 (90 Base) MCG/ACT inhaler Inhale 2 puffs into the lungs every 6 (six) hours as needed for wheezing or shortness of breath.     apixaban  (ELIQUIS ) 5 MG TABS tablet Take 1 tablet (5 mg total) by mouth 2 (two) times daily. 60 tablet 4   aspirin  EC 81 MG tablet Take 81 mg by mouth at bedtime. Swallow whole.     B Complex-C-Folic Acid  (B-COMPLEX BALANCED PO) Take 1 tablet by mouth daily at 12 noon.     bumetanide (BUMEX) 0.5 MG tablet Take 0.25 mg by mouth 2 (two) times daily.     cefadroxil  (DURICEF) 500 MG capsule Take 2 capsules (1,000 mg total) by mouth 2 (two) times daily. 120 capsule 0   cholecalciferol (VITAMIN D3) 25 MCG (1000 UNIT) tablet Take 1,000 Units by mouth daily at 12 noon.     ciclopirox (LOPROX) 0.77 % cream Apply 1 application topically 2 (two) times daily as needed (itching).     CRANBERRY FRUIT PO Take 1 capsule by mouth daily at 12 noon.     Docusate Sodium (DSS) 100 MG CAPS Take 1 capsule by mouth 2 (two) times daily.     doxycycline  (VIBRA -TABS) 100 MG tablet Take 1 tablet (100 mg total) by mouth every 12 (twelve) hours. 60 tablet 0   DULoxetine  (CYMBALTA ) 60 MG capsule Take 1 capsule (60 mg total) by mouth daily. (Patient taking differently: Take 60 mg by mouth 2 (two) times daily.) 30 capsule 12   Empagliflozin-metFORMIN HCl ER (SYNJARDY XR)  25-1000 MG TB24 Take 1 tablet by mouth at bedtime.     Finerenone (KERENDIA) 10 MG TABS Take 10 mg by mouth every Monday, Wednesday, and Friday. On Monday, Wednesday, & Friday     [facility] MAGNESIUM OXIDE 250 MG TABS Take 1 tablet by mouth daily.     hydrALAZINE  (APRESOLINE ) 100 MG tablet Take 100 mg by mouth 2 (two) times daily.     hydrocortisone  cream 1 % Apply to affected area 2 times daily (Patient taking differently: as needed for itching. Apply to affected area 2 times daily) 15 g 0   hydrOXYzine  (ATARAX /VISTARIL ) 25 MG tablet Take 25 mg by mouth daily at 12 noon.     insulin  glargine (LANTUS ) 100 UNIT/ML injection Inject 40 Units into the skin at bedtime.     irbesartan  (AVAPRO ) 150 MG tablet  TAKE 1 TABLET BY MOUTH EVERY DAY (Patient taking differently: Take 150 mg by mouth at bedtime.) 30 tablet 11   metoprolol  tartrate (LOPRESSOR ) 25 MG tablet Take 25 mg by mouth 2 (two) times daily.     NOVOLOG  FLEXPEN 100 UNIT/ML FlexPen Inject 2-15 Units into the skin 3 (three) times daily with meals. Per sliding scale for levels that exceeds 121-150.     OZEMPIC, 1 MG/DOSE, 4 MG/3ML SOPN Inject 1 mg into the skin every Sunday.     POLY-IRON 150 150 MG capsule Take 150 mg by mouth 2 (two) times daily.     rosuvastatin  (CRESTOR ) 40 MG tablet Take 40 mg by mouth at bedtime.      traMADol (ULTRAM) 50 MG tablet Take 50 mg by mouth every morning.     No facility-administered medications prior to visit.     Allergies  Allergen Reactions   Penicillins Other (See Comments)    PATIENT HAS HAD A PCN REACTION WITH IMMEDIATE RASH, FACIAL/TONGUE/THROAT SWELLING, SOB, OR LIGHTHEADEDNESS WITH HYPOTENSION:  #  #  YES  #  #  Has patient had a PCN reaction causing severe rash involving mucus membranes or skin necrosis: No Has patient had a PCN reaction that required hospitalization No Has patient had a PCN reaction occurring within the last 10 years: No    Bee Venom Dermatitis, Itching and Other (See Comments)    Latex    Other Hives, Itching and Rash    Powder in gloves Arthropod Insect    Social History   Tobacco Use   Smoking status: Never    Passive exposure: Never   Smokeless tobacco: Never  Vaping Use   Vaping status: Never Used  Substance Use Topics   Alcohol use: No   Drug use: No    Family History  Problem Relation Age of Onset   Diabetes Mother    Heart disease Mother    Hypertension Mother    Diabetes Father    Heart disease Father    Hypertension Father    Heart attack Father    Diabetes Sister    Fibromyalgia Sister    Diabetes Sister    Diabetes Brother    Heart attack Maternal Grandmother        <35   Heart attack Maternal Grandfather    Breast cancer Paternal Grandmother 29       breast; dbl mastectomy   Heart attack Paternal Grandfather    Colon polyps Neg Hx    Colon cancer Neg Hx    Crohn's disease Neg Hx    Esophageal cancer Neg Hx    Rectal cancer Neg Hx    Stomach cancer Neg Hx     Objective:  There were no vitals filed for this visit. There is no height or weight on file to calculate BMI.  Physical Exam Constitutional:      Appearance: Normal appearance.  HENT:     Head: Normocephalic and atraumatic.     Right Ear: Tympanic membrane normal.     Left Ear: Tympanic membrane normal.     Nose: Nose normal.     Mouth/Throat:     Mouth: Mucous membranes are moist.  Eyes:     Extraocular Movements: Extraocular movements intact.     Conjunctiva/sclera: Conjunctivae normal.     Pupils: Pupils are equal, round, and reactive to light.  Cardiovascular:     Rate and Rhythm: Normal rate and regular rhythm.     Heart sounds: No murmur  heard.    No friction rub. No gallop.  Pulmonary:     Effort: Pulmonary effort is normal.     Breath sounds: Normal breath sounds.  Abdominal:     General: Abdomen is flat.     Palpations: Abdomen is soft.  Skin:    General: Skin is warm and dry.  Neurological:     General: No focal deficit present.     Mental  Status: She is alert and oriented to person, place, and time.  Psychiatric:        Mood and Affect: Mood normal.     Lab Results: Lab Results  Component Value Date   WBC 11.2 (H) 03/14/2024   HGB 11.4 (L) 03/14/2024   HCT 36.1 03/14/2024   MCV 87.8 03/14/2024   PLT 312 03/14/2024    Lab Results  Component Value Date   CREATININE 1.14 (H) 03/14/2024   BUN 26 (H) 03/14/2024   NA 140 03/14/2024   K 4.4 03/14/2024   CL 102 03/14/2024   CO2 27 03/14/2024    Lab Results  Component Value Date   ALT 13 03/14/2024   AST 18 03/14/2024   ALKPHOS 81 12/29/2023   BILITOT 0.4 03/14/2024     Assessment & Plan:  #Concern for left foot abscess, septic arthritis, with concern for osteomyelitis in the setting of Charcot arthropathy #Penicillin allergy with rash(as a teenager) - MRI showed left foot abscess, septic arthritis, possible osteomyelitis.  IR could not aspirate abscess. - She was discharged on cefadroxil  and doxycycline  for about a month. Continued  on abx at last visit given swelling persisted and ordered labs and xray.  Seen by orthopedics on 515, recommended Crow walker.  For repeat i x-ray in 3 months for Charcot joint foot due to diabetes. -  On 6/3 Left foot xrau showed Moderate to high-grade diffuse soft tissue swelling is similar to 02/18/2024 -lef looks worse, some pain on an doxy and cefadroxil  Plan -due ot elevated wbc (11.2 on 6/3) and prior abscess concern not improving abx will sen  to ed for stat MRI and blood Cx.   -F/U in one month     Loney Stank, MD [facility]   04/06/24  10:46 AM

## 2024-04-06 NOTE — ED Provider Notes (Signed)
  EMERGENCY DEPARTMENT AT [facility] Provider Note   CSN: [id number] Arrival date & time: 04/06/24  1120     Patient presents with: Foot Pain   Alicia Coffey is a 53 y.o. female with past medical history significant for hyperlipidemia, hypertension, diabetes who presents with concern for left foot pain, swelling, with history of Charcot foot, she is being followed by infectious disease.  She was recently hospitalized with some IV antibiotics, completed course of outpatient antibiotics, still with some soft tissue swelling and redness, and was sent by infectious disease today for an MRI to evaluate for osteomyelitis or other severe developing infection.    Foot Pain       Prior to Admission medications   Medication Sig Start Date End Date Taking? Authorizing Provider  acetaminophen  (TYLENOL ) 500 MG tablet Take 1,000 mg by mouth every 6 (six) hours as needed for mild pain or headache.    [provider]  albuterol (PROVENTIL HFA;VENTOLIN HFA) 108 (90 Base) MCG/ACT inhaler Inhale 2 puffs into the lungs every 6 (six) hours as needed for wheezing or shortness of breath.    [provider]  apixaban  (ELIQUIS ) 5 MG TABS tablet Take 1 tablet (5 mg total) by mouth 2 (two) times daily. 10/08/23   Odean Potts, MD  aspirin  EC 81 MG tablet Take 81 mg by mouth at bedtime. Swallow whole.    [provider]  B Complex-C-Folic Acid  (B-COMPLEX BALANCED PO) Take 1 tablet by mouth daily at 12 noon.    [provider]  bumetanide (BUMEX) 0.5 MG tablet Take 0.25 mg by mouth 2 (two) times daily.    [provider]  cefadroxil  (DURICEF) 500 MG capsule Take 2 capsules (1,000 mg total) by mouth 2 (two) times daily. 03/14/24 04/13/24  Dennise Kingsley, MD  cholecalciferol (VITAMIN D3) 25 MCG (1000 UNIT) tablet Take 1,000 Units by mouth daily at 12 noon.    [provider]  ciclopirox (LOPROX) 0.77 % cream Apply 1 application topically 2  (two) times daily as needed (itching).    [provider]  CRANBERRY FRUIT PO Take 1 capsule by mouth daily at 12 noon.    [provider]  Docusate Sodium (DSS) 100 MG CAPS Take 1 capsule by mouth 2 (two) times daily.    [provider]  doxycycline  (VIBRA -TABS) 100 MG tablet Take 1 tablet (100 mg total) by mouth every 12 (twelve) hours. 03/14/24 04/13/24  Dennise Kingsley, MD  DULoxetine  (CYMBALTA ) 60 MG capsule Take 1 capsule (60 mg total) by mouth daily. Patient taking differently: Take 60 mg by mouth 2 (two) times daily. 12/14/13   Onita Duos, MD  Empagliflozin-metFORMIN HCl ER (SYNJARDY XR) 25-1000 MG TB24 Take 1 tablet by mouth at bedtime.    [provider]  Finerenone BELLA) 10 MG TABS Take 10 mg by mouth every Monday, Wednesday, and Friday. On Monday, Wednesday, & Friday 06/12/21   [provider]  GNP MAGNESIUM OXIDE 250 MG TABS Take 1 tablet by mouth daily. 01/11/24   [provider]  hydrALAZINE  (APRESOLINE ) 100 MG tablet Take 100 mg by mouth 2 (two) times daily. 12/03/21   [provider]  hydrocortisone  cream 1 % Apply to affected area 2 times daily Patient taking differently: as needed for itching. Apply to affected area 2 times daily 08/12/15   Rose, Kayla, PA-C  hydrOXYzine  (ATARAX /VISTARIL ) 25 MG tablet Take 25 mg by mouth daily at 12 noon.    [provider]  insulin  glargine (LANTUS ) 100 UNIT/ML injection Inject 40 Units into the skin at bedtime.    [provider]  irbesartan  (AVAPRO ) 150 MG tablet TAKE 1 TABLET BY MOUTH EVERY DAY Patient taking differently: Take 150 mg by mouth at bedtime. 08/27/23   Darlean Ozell NOVAK, MD  metoprolol  tartrate (LOPRESSOR ) 25 MG tablet Take 25 mg by mouth 2 (two) times daily.    [provider]  NOVOLOG  FLEXPEN 100 UNIT/ML FlexPen Inject 2-15 Units into the skin 3 (three) times daily with meals. Per sliding scale for levels that exceeds 121-150. 07/17/20   [provider]  OZEMPIC, 1 MG/DOSE, 4 MG/3ML SOPN Inject 1 mg into the skin every Sunday.    [provider]  POLY-IRON 150 150 MG capsule Take 150 mg by mouth 2 (two) times daily. 01/20/24   [provider]  rosuvastatin  (CRESTOR ) 40 MG tablet Take 40 mg by mouth at bedtime.     [provider]  traMADol (ULTRAM) 50 MG tablet Take 50 mg by mouth every morning.    [provider]    Allergies: Penicillins, Bee venom, Latex, and Other    Review of Systems  All other systems reviewed and are negative.   Updated Vital Signs BP 138/77 (BP Location: Left Arm)   Pulse (!) 103   Temp 99.3 F (37.4 C) (Oral)   Resp 18   SpO2 100%   Physical Exam Vitals and nursing note reviewed.  Constitutional:      General: She is not in acute distress.    Appearance: Normal appearance.  HENT:     Head: Normocephalic and atraumatic.   Eyes:     General:        Right eye: No discharge.        Left eye: No discharge.    Cardiovascular:     Rate and Rhythm: Normal rate and regular rhythm.     Pulses: Normal pulses.  Pulmonary:     Effort: Pulmonary effort is normal. No respiratory distress.   Musculoskeletal:        General: No deformity.     Comments: Significant soft tissue swelling, redness without purulence, or active discharge noted.  She does have an area of more focal redness, tenderness on the lateral malleolus of the affected foot.   Skin:    General: Skin is warm and dry.     Capillary Refill: Capillary refill takes less than 2 seconds.   Neurological:     Mental Status: She is alert and oriented to person, place, and time.   Psychiatric:        Mood and Affect: Mood normal.        Behavior: Behavior normal.     (all labs ordered are listed, but only abnormal results are displayed) Labs Reviewed  CBC WITH DIFFERENTIAL/PLATELET - Abnormal; Notable for the following components:      Result Value   WBC 10.8 (*)    Eosinophils Absolute 1.0  (*)    All other components within normal limits  COMPREHENSIVE METABOLIC PANEL WITH GFR - Abnormal; Notable for the following components:   Glucose, Bld 175 (*)    Creatinine, Ser 1.19 (*)    GFR, Estimated 54 (*)    All other components within normal limits  CULTURE, BLOOD (ROUTINE X 2)  CULTURE, BLOOD (ROUTINE X 2)  I-STAT CG4 LACTIC ACID, ED    EKG: None  Radiology: No results found.   Procedures   Medications Ordered in  the ED  gadobutrol  (GADAVIST ) 1 MMOL/ML injection 10 mL (has no administration in time range)                                    Medical Decision Making Amount and/or Complexity of Data Reviewed Labs: ordered. Radiology: ordered.   This patient is a 53 y.o. female  who presents to the ED for concern of foot infection.   Differential diagnoses prior to evaluation: The emergent differential diagnosis includes, but is not limited to, complication related to Charcot foot, osteomyelitis, vs cellulitis, vs other. This is not an exhaustive differential.   Past Medical History / Co-morbidities / Social History:  hyperlipidemia, hypertension, diabetes who presents with concern for left foot pain, swelling, with history of Charcot foot, she is being followed by infectious disease  Additional history: Chart reviewed. Pertinent results include: Sensibly reviewed lab work, imaging from recent hospitalization, as well as notes from her outpatient infectious disease specialist.  Physical Exam: Physical exam performed. The pertinent findings include: Significant soft tissue swelling, redness without purulence, or active discharge noted.  She does have an area of more focal redness, tenderness on the lateral malleolus of the affected foot.   Able to palpate 2+ pulse in affected extremity  Lab Tests/Imaging studies: I personally interpreted labs/imaging and the pertinent results include:  leukocytosis WBC 10.8, otherwise unremarkable, CMP with some hyperglycmiea,  glucose 175, creatinine 1.19 slightly worse than baseline without AKI. MR foot pending at time of handoff.  3:21 PM Care of Alicia Coffey transferred to [facility] and Dr. Dean at the end of my shift as the patient will require reassessment once labs/imaging have resulted. Patient presentation, ED course, and plan of care discussed with review of all pertinent labs and imaging. Please see his/her note for further details regarding further ED course and disposition. Plan at time of handoff is reassess after MR, may need repeat admission for antibiotics secondary to osteomyelitis. This may be altered or completely changed at the discretion of the oncoming team pending results of further workup.   Final diagnoses:  None    ED Discharge Orders     None          Alicia Coffey 04/06/24 1522    Bernard Drivers, MD 04/07/24 1105

## 2024-04-11 LAB — CULTURE, BLOOD (ROUTINE X 2)
Culture: NO GROWTH
Culture: NO GROWTH

## 2024-04-28 ENCOUNTER — Ambulatory Visit (INDEPENDENT_AMBULATORY_CARE_PROVIDER_SITE_OTHER): Admitting: Pulmonary Disease

## 2024-04-28 DIAGNOSIS — J849 Interstitial pulmonary disease, unspecified: Secondary | ICD-10-CM | POA: Diagnosis not present

## 2024-04-28 LAB — PULMONARY FUNCTION TEST
DL/VA % pred: 124 %
DL/VA: 5.33 ml/min/mmHg/L
DLCO cor % pred: 81 %
DLCO cor: 16.51 ml/min/mmHg
DLCO unc % pred: 73 %
DLCO unc: 15.02 ml/min/mmHg
FEF 25-75 Post: 2.4 L/s
FEF 25-75 Pre: 2.39 L/s
FEF2575-%Change-Post: 0 %
FEF2575-%Pred-Post: 93 %
FEF2575-%Pred-Pre: 92 %
FEV1-%Change-Post: 0 %
FEV1-%Pred-Post: 69 %
FEV1-%Pred-Pre: 69 %
FEV1-Post: 1.85 L
FEV1-Pre: 1.84 L
FEV1FVC-%Change-Post: 0 %
FEV1FVC-%Pred-Pre: 106 %
FEV6-%Change-Post: 1 %
FEV6-%Pred-Post: 66 %
FEV6-%Pred-Pre: 65 %
FEV6-Post: 2.19 L
FEV6-Pre: 2.17 L
FEV6FVC-%Pred-Post: 103 %
FEV6FVC-%Pred-Pre: 103 %
FVC-%Change-Post: 1 %
FVC-%Pred-Post: 64 %
FVC-%Pred-Pre: 63 %
FVC-Post: 2.19 L
FVC-Pre: 2.17 L
Post FEV1/FVC ratio: 84 %
Post FEV6/FVC ratio: 100 %
Pre FEV1/FVC ratio: 85 %
Pre FEV6/FVC Ratio: 100 %
RV % pred: 71 %
RV: 1.32 L
TLC % pred: 70 %
TLC: 3.5 L

## 2024-04-28 NOTE — Patient Instructions (Signed)
 Full PFT performed today.

## 2024-04-28 NOTE — Progress Notes (Signed)
 Full PFT performed today.

## 2024-04-29 NOTE — Progress Notes (Unsigned)
 KELLIANN PENDERGRAPH, female    DOB: Jul 21, 1971    MRN: [id number]   Brief patient profile:  22  yobf with fibromyalgia/ MO never smoker but problems with cough around heavy cig smoke in her early 30s referred to pulmonary clinic in [place]  10/09/2022 by Dr Sheppard Sierras  for doe since her 53's  but worse x age 53 and not directly proportionate to activity    Wt at 40's  250   H/o breast ca on tamoxifen  stopped around 2016    History of Present Illness  10/09/2022  Pulmonary/ 1st office eval/ [place] / [facility] on benazapril  Chief Complaint  Patient presents with   Consult    SOB exertion- thinks it may be due to back pain Abnormal CT   Dyspnea:   somewhat reproducible @ [employer] shopping x 3-4 aisles but varies and sometimes assoc with audible wheeze/ sweating/ back pain neg cp  Cough: none  Sleep: no resp cc / 2 pillows  bothered by nasal congestion  SABA use: albuterol sometimes help 02: none  Minimal HB with ketchup   Rec Ok to try albuterol 15 min before an activity (on alternating days)  that you know would usually make you short of breath Stop benazepril and start ibesartan 150 mg one daily  Return to our lab in about 2 weeks to check your blood for kidney function and call me with any issues with your blood pressure     11/25/2022  f/u ov/[place] [facility] re: doe/ cough / abn ct chest   Chief Complaint  Patient presents with   Follow-up    Still has a cough and need to clear throat   Dyspnea:  seems to correlate with being upright and assoc with low back pain, no longer happening every day / has not tried saba rx   Cough: still has globus sensation / loss of voice / using lots of mints Sleeping: bed is flat/ 2 pillows or nose gets stopped  SABA use: not using  02: none Rec Try prilosec otc 20mg   Take 30-60 min before first meal of the day and Pepcid  ac (famotidine ) 20 mg one after supper until cough is completely gone for at least a week without the need  for cough suppression GERD diet reviewed, bed blocks rec   Referred to [facility] for w/u of cystic lung dz   PFTs: 04/21/2023 FVC 2.00 [58%], FEV1 1.81 [69%], F/F90, TLC 4.50 [90%], DLCO 14.24 [69%]     Labs: Labs 04/23/2023-SSA, SSB, rheumatoid factor, ANA-negative Alpha-1 antitrypsin levels and phenotype- normal PIMM VEGF_D levels sent to [facility] are pending   Assessment:  Cystic lung disease High-res CT shows multiple thin-walled cysts without any underlying interstitial changes.  This may be bland, nonspecific or possibly lymphangiomyomatosis Labs negative for pathologic causes. Pending VEGF-D result. Likely benign, possibly genetic. No associated kidney issues on CT abdomen. -Plan for follow-up CT in 7 months. -Perform lung function test in 7 months.   Adrenal Mass Identified on CT abdomen. MRI performed but results pending. -Await MRI results. If abnormal, further steps will be discussed with the patient.   Breast Lump History of LCIS. New lump identified by patient. Biopsy scheduled. -[facility] care with [facility] due to history and family history of breast cancer.   General Health Maintenance / Followup Plans -Return for follow-up in 7 months for CT and lung function test. -Await MRI results and discuss further steps if necessary. -Continue care with [facility]  for breast lump and LCIS history.    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    Also denies any obvious fluctuation of symptoms with weather or environmental changes or other aggravating or alleviating factors except as outlined above   No unusual exposure hx or h/o childhood pna/ asthma or knowledge of premature birth.  Current Allergies, Complete Past Medical History, Past Surgical History, Family History, and Social History were reviewed in [employer] record.  ROS  The following are not active complaints unless bolded Hoarseness, sore throat, dysphagia, dental problems, itching, sneezing,  nasal congestion or discharge of excess mucus or purulent secretions, ear ache,   fever, chills, sweats, unintended wt loss or wt gain, classically pleuritic or exertional cp,  orthopnea pnd or arm/hand swelling  or leg swelling, presyncope, palpitations, abdominal pain, anorexia, nausea, vomiting, diarrhea  or change in bowel habits or change in bladder habits, change in stools or change in urine, dysuria, hematuria,  rash, arthralgias, visual complaints, headache, numbness, weakness or ataxia or problems with walking or coordination,  change in mood or  memory.        Current Meds  Medication Sig   acetaminophen  (TYLENOL ) 500 MG tablet Take 1,000 mg by mouth every 6 (six) hours as needed for mild pain or headache.   albuterol (PROVENTIL HFA;VENTOLIN HFA) 108 (90 Base) MCG/ACT inhaler Inhale 2 puffs into the lungs every 6 (six) hours as needed for wheezing or shortness of breath.   apixaban  (ELIQUIS ) 5 MG TABS tablet Take 1 tablet (5 mg total) by mouth 2 (two) times daily.   aspirin  EC 81 MG tablet Take 81 mg by mouth at bedtime. Swallow whole.   B Complex-C-Folic Acid  (B-COMPLEX BALANCED PO) Take 1 tablet by mouth daily at 12 noon.   bumetanide (BUMEX) 0.5 MG tablet Take 0.25 mg by mouth 2 (two) times  daily.   cholecalciferol (VITAMIN D3) 25 MCG (1000 UNIT) tablet Take 1,000 Units by mouth daily at 12 noon.   ciclopirox (LOPROX) 0.77 % cream Apply 1 application topically 2 (two) times daily as needed (itching).   CRANBERRY FRUIT PO Take 1 capsule by mouth daily at 12 noon.   Docusate Sodium (DSS) 100 MG CAPS Take 1 capsule by mouth 2 (two) times daily.   DULoxetine  (CYMBALTA ) 60 MG capsule Take 1 capsule (60 mg total) by mouth daily. (Patient taking differently: Take 60 mg by mouth 2 (two) times daily.)   Empagliflozin-metFORMIN HCl ER (SYNJARDY XR) 25-1000 MG TB24 Take 1 tablet by mouth at bedtime.   Finerenone (KERENDIA) 10 MG TABS Take 10 mg by mouth every Monday, Wednesday, and Friday. On Monday, Wednesday, &  Friday   GNP MAGNESIUM OXIDE 250 MG TABS Take 1 tablet by mouth daily.   hydrALAZINE  (APRESOLINE ) 100 MG tablet Take 100 mg by mouth 2 (two) times daily.   hydrocortisone  cream 1 % Apply to affected area 2 times daily (Patient taking differently: as needed for itching. Apply to affected area 2 times daily)   hydrOXYzine  (ATARAX /VISTARIL ) 25 MG tablet Take 25 mg by mouth daily at 12 noon.   insulin  glargine (LANTUS ) 100 UNIT/ML injection Inject 40 Units into the skin at bedtime.   irbesartan  (AVAPRO ) 150 MG tablet TAKE 1 TABLET BY MOUTH EVERY DAY (Patient taking differently: Take 150 mg by mouth at bedtime.)   metoprolol  tartrate (LOPRESSOR ) 25 MG tablet Take 25 mg by mouth 2 (two) times daily.   NOVOLOG  FLEXPEN 100 UNIT/ML FlexPen Inject 2-15 Units into the skin 3 (three) times daily with meals. Per sliding scale for levels that exceeds 121-150.   OZEMPIC, 1 MG/DOSE, 4 MG/3ML SOPN Inject 1 mg into the skin every Sunday.   POLY-IRON 150 150 MG capsule Take 150 mg by mouth 2 (two) times daily.   rosuvastatin (CRESTOR) 40 MG tablet Take 40 mg by mouth at bedtime.    traMADol (ULTRAM) 50 MG tablet Take 50 mg by mouth every morning.                     Past Medical History:   Diagnosis Date   Anemia    Anxiety    Arthritis    Right knee   CKD (chronic kidney disease) stage 2, GFR 60-89 ml/min    Dr. 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   Obstructive sleep apnea of adult 2017   Proteinuria    Type 2 diabetes mellitus (HCC)    Vertigo 2015   Wears dentures       Objective:    wts  05/01/2024       24 4  11/25/22 241 lb 12.8 oz (109.7 kg)  10/09/22 236 lb (107 kg)  08/11/22 244 lb (110.7 kg)    Vital signs reviewed  05/01/2024  - Note at rest 02 sats  97% on RA   General appearance:    amb (with roller for L leg) MO (by BMI) bf nad    HEENT : Oropharynx  clear      Nasal turbinates nl    NECK :  without  apparent JVD/ palpable Nodes/TM    LUNGS: no acc muscle use,  Nl contour chest which is clear to A and P bilaterally without cough on insp or exp maneuvers   CV:  RRR  no s3 or murmur or increase in P2, and no edema   ABD:  soft and nontender   MS:    ext warm with L foot in brace/ no   calf tenderness, cyanosis or clubbing    SKIN: warm and dry without lesions    NEURO:  alert, approp, nl sensorium with  no motor or cerebellar deficits apparent.       I personally reviewed images and agree with radiology impression as follows:   Chest HRCT1/9/25    1. No evidence of fibrotic interstitial lung disease. 2. 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 3.  Right adrenal adenoma. 4. Enlarged pulmonic trunk, indicative of pulmonary arterial hypertension.           Assessment

## 2024-05-01 ENCOUNTER — Ambulatory Visit: Admitting: Internal Medicine

## 2024-05-01 ENCOUNTER — Ambulatory Visit (INDEPENDENT_AMBULATORY_CARE_PROVIDER_SITE_OTHER): Admitting: Internal Medicine

## 2024-05-01 ENCOUNTER — Encounter: Payer: Self-pay | Admitting: Internal Medicine

## 2024-05-01 VITALS — BP 145/74 | HR 94 | Ht 63.0 in | Wt 244.2 lb

## 2024-05-01 DIAGNOSIS — D3500 Benign neoplasm of unspecified adrenal gland: Secondary | ICD-10-CM | POA: Diagnosis not present

## 2024-05-01 DIAGNOSIS — R0609 Other forms of dyspnea: Secondary | ICD-10-CM

## 2024-05-01 DIAGNOSIS — J984 Other disorders of lung: Secondary | ICD-10-CM

## 2024-05-01 DIAGNOSIS — N63 Unspecified lump in unspecified breast: Secondary | ICD-10-CM

## 2024-05-01 NOTE — Patient Instructions (Signed)
 Keep up the good on the weight loss - it's the most important aspect of our lung care for now  Please schedule a follow up visit in 3 months but call sooner if needed >>> Dr   Theophilus

## 2024-05-01 NOTE — Assessment & Plan Note (Signed)
 Onset in her 40's at wt around 250  - 10/09/2022  wt 236  Walked on RA  x  3  lap(s) =  approx 450  ft  @ fast pace, stopped due to end of study min sob on last lap  with lowest 02 sats 94%  - 11/25/2022   Walked on RA  x  3  lap(s) =  approx 450  ft  @ fast pace, stopped due to end of study  with lowest 02 sats 94%   - PFT's7/08/2023 FVC 2.00 [58%], FEV1 1.81 [69%], F/F90, TLC 4.50 [90%], DLCO 14.24 [69%] - PFT's  04/28/24  FVC  2.17 (63%)  FEV1 1.85 (69 % ) ratio 0.84  p 0 % improvement from saba p 0 prior to study with DLCO  15 (73%)   and FV curve nl  and ERV 9 at wt 242    No change restrictive pattern with low ERV indicative of the effects of obesity on lung volumes which is likely the most reversible aspect of her symptoms   Rec Continue effforts to lose wt and be sure you check your oxygen saturation at your highest level of activity(NOT after you stop)  to be sure it stays over 90% and keep track of it at least once a week, more often if breathing getting worse, and let me know if losing ground. (Collect the dots to connect the dots approach)    F/u Dr Theophilus as planned

## 2024-05-01 NOTE — Assessment & Plan Note (Signed)
 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

## 2024-05-01 NOTE — Assessment & Plan Note (Addendum)
 ERV 9% on pfts 04/28/24   Body mass index is 43.26 kg/m.  -  trending down  Lab Results  Component Value Date   TSH 1.852 03/13/2018      Contributing to doe and risk of recurrent  DVT /GERD >>>   reviewed the need and the process to achieve and maintain neg calorie balance > defer f/u primary care including intermittently monitoring thyroid  status       Each maintenance medication was reviewed in detail including emphasizing most importantly the difference between maintenance and prns and under what circumstances the prns are to be triggered using an action plan format where appropriate.  Total time for H and P, chart review, counseling, reviewing hfa device(s) and generating customized AVS unique to this office visit / same day charting = 45 min complex pt with multiple  refractory respiratory  symptoms of uncertain etiology.

## 2024-05-09 ENCOUNTER — Telehealth: Payer: Self-pay | Admitting: Nurse Practitioner

## 2024-05-09 ENCOUNTER — Ambulatory Visit: Payer: Self-pay

## 2024-05-09 NOTE — Telephone Encounter (Signed)
 Error

## 2024-05-09 NOTE — Telephone Encounter (Signed)
 Reason for Disposition  Health information question, no triage required and triager able to answer question  Answer Assessment - Initial Assessment Questions This RN contacted patient as she was requesting incontinence supplies. Confirmed she has outside PCP and called us  accidentally as DME company had not updated her PCP information. Advised to call her current PCP.  Protocols used: Information Only Call - No Triage-A-AH

## 2024-05-09 NOTE — Telephone Encounter (Unsigned)
 Copied from CRM [id number]. Topic: Clinical - Order For Equipment >> May 09, 2024 11:53 AM Tiffini S wrote: Reason for CRM: Patietn called for incontinence supplies with [facility] [phone] for briefs, gloves, and bed pads Patient was received fresh fruit/ vegetable boxes sent every month- wants to change to the pre made frozen meals. Patient haven't received anything in the past few months. Please call back and follow up with patient at [phone].

## 2024-05-16 ENCOUNTER — Encounter: Payer: Self-pay | Admitting: Podiatry

## 2024-05-16 ENCOUNTER — Ambulatory Visit (INDEPENDENT_AMBULATORY_CARE_PROVIDER_SITE_OTHER): Admitting: Podiatry

## 2024-05-16 DIAGNOSIS — B351 Tinea unguium: Secondary | ICD-10-CM

## 2024-05-16 DIAGNOSIS — E0843 Diabetes mellitus due to underlying condition with diabetic autonomic (poly)neuropathy: Secondary | ICD-10-CM

## 2024-05-16 DIAGNOSIS — M79609 Pain in unspecified limb: Secondary | ICD-10-CM

## 2024-05-16 DIAGNOSIS — M14672 Charcot's joint, left ankle and foot: Secondary | ICD-10-CM

## 2024-05-16 NOTE — Progress Notes (Signed)
 This patient returns to my office for at risk foot care.  This patient requires this care by a professional since this patient will be at risk due to having diabetes and charcot foot left. This patient is unable to cut nails herself since the patient cannot reach her nails.These nails are painful walking and wearing shoes.  She has history of Charcot foot treated in [place]. This patient presents for at risk foot care today.  General Appearance  Alert, conversant and in no acute stress.  Vascular  Dorsalis pedis and posterior tibial  pulses are palpable  bilaterally.  Capillary return is within normal limits  bilaterally. Temperature is within normal limits  bilaterally.  Neurologic  Senn-Weinstein monofilament wire test within normal limits  bilaterally. Muscle power within normal limits bilaterally.  Nails Thick disfigured discolored nails with subungual debris  from hallux to fifth toes bilaterally. No evidence of bacterial infection or drainage bilaterally.  Orthopedic  No limitations of motion  feet .  No crepitus or effusions noted.  No bony pathology or digital deformities noted. Swelling and increased temperature left ankle.  Skin  normotropic skin with no porokeratosis noted bilaterally.  No signs of infections or ulcers noted.     Onychomycosis  Pain in right toes  Pain in left toes  Charcot foot left foot.  Consent was obtained for treatment procedures.   Mechanical debridement of nails 1-5  bilaterally performed with a nail nipper.  Filed with dremel without incident. Padding added to boot due to swollen ankle and open wounds.   Return office visit   3 months                   Told patient to return for periodic foot care and evaluation due to potential at risk complications.   Cordella Bold DPM

## 2024-05-24 ENCOUNTER — Telehealth: Payer: Self-pay | Admitting: Nurse Practitioner

## 2024-05-24 NOTE — Telephone Encounter (Signed)
 Copied from CRM #[id number]. Topic: Medical Record Request - Records Request >> May 24, 2024  1:15 PM Pinkey ORN wrote: Reason for CRM: AEROFLOW >> May 24, 2024  1:17 PM Pinkey ORN wrote: [place] - [facility] [phone] Called on behalf of not receiving patient's office notes per request as she's called a couple times now.

## 2024-06-05 ENCOUNTER — Ambulatory Visit (INDEPENDENT_AMBULATORY_CARE_PROVIDER_SITE_OTHER): Admitting: Orthopedic Surgery

## 2024-06-05 ENCOUNTER — Other Ambulatory Visit (INDEPENDENT_AMBULATORY_CARE_PROVIDER_SITE_OTHER): Payer: Self-pay

## 2024-06-05 DIAGNOSIS — M25572 Pain in left ankle and joints of left foot: Secondary | ICD-10-CM

## 2024-06-05 DIAGNOSIS — E11610 Type 2 diabetes mellitus with diabetic neuropathic arthropathy: Secondary | ICD-10-CM | POA: Diagnosis not present

## 2024-06-05 DIAGNOSIS — M79672 Pain in left foot: Secondary | ICD-10-CM | POA: Diagnosis not present

## 2024-06-06 ENCOUNTER — Encounter: Payer: Self-pay | Admitting: Orthopedic Surgery

## 2024-06-06 NOTE — Progress Notes (Signed)
 Office Visit Note   Patient: Alicia Coffey           Date of Birth: [birth year]           MRN: [id number] Visit Date: 06/05/2024              Requested by: Lenon Nell SAILOR, FNP [address] [place],  [place] [address] PCP: Lenon Nell SAILOR, FNP  Chief Complaint  Patient presents with   Left Leg - Pain, Follow-up      HPI: Patient is seen for evaluation for Charcot collapse of the left foot.  She is currently using a kneeling scooter and has a Midwife.  Patient states that she is wearing the Vive compression socks.  Assessment & Plan: Visit Diagnoses:  1. Pain in left foot   2. Pain in left ankle and joints of left foot     Plan: Plan: Patient will continue with compression elevation and nonweightbearing.  Discussed that if she develops any skin breakdown from the swelling to call for follow-up.  Three-view radiographs of the left foot at follow-up in 4 weeks.  Follow-Up Instructions: No follow-ups on file.   Ortho Exam  Patient is alert, oriented, no adenopathy, well-dressed, normal affect, normal respiratory effort. Examination patient has a palpable dorsalis pedis pulse.  She does have significant swelling in the foot and ankle but there are no open ulcers.  Evaluation her foot does fit well in the [facility] walker however she does have increased swelling and is at risk of skin breakdown.  There is no redness, no cellulitis, no warmth, no signs of active Charcot process.      Imaging: No results found. No images are attached to the encounter.  Labs: Lab Results  Component Value Date   HGBA1C 9.5 (H) 02/19/2024   HGBA1C 7.5 (H) 08/23/2018   HGBA1C 8.8 (H) 11/24/2017   ESRSEDRATE 51 (H) 03/14/2024   ESRSEDRATE 55 (H) 02/19/2024   ESRSEDRATE 60 (H) 02/18/2024   CRP 4.4 03/14/2024   CRP 4.5 (H) 02/19/2024   CRP 5.1 (H) 02/18/2024   REPTSTATUS 04/11/2024 FINAL 04/06/2024   CULT  04/06/2024    NO GROWTH 5 DAYS Performed at [facility], [address] [place], [place] [address]      Lab Results  Component Value Date   ALBUMIN 3.9 04/06/2024   ALBUMIN 3.8 12/29/2023   ALBUMIN 3.2 (L) 09/21/2023    No results found for: MG No results found for: VD25OH  No results found for: PREALBUMIN    Latest Ref Rng & Units 04/06/2024   12:18 PM 03/14/2024   10:43 AM 02/21/2024    3:48 AM  CBC EXTENDED  WBC 4.0 - 10.5 K/uL 10.8  11.2  9.1   RBC 3.87 - 5.11 MIL/uL 4.59  4.11  3.82   Hemoglobin 12.0 - 15.0 g/dL 87.2  88.5  89.4   HCT 36.0 - 46.0 % 41.0  36.1  33.8   Platelets 150 - 400 K/uL 218  312  219   NEUT# 1.7 - 7.7 K/uL 7.1  7,011    Lymph# 0.7 - 4.0 K/uL 2.0        There is no height or weight on file to calculate BMI.  Orders:  Orders Placed This Encounter  Procedures   XR Foot Complete Left   No orders of the defined types were placed in this encounter.    Procedures: No procedures performed  Clinical Data: No additional  findings.  ROS:  All other systems negative, except as noted in the HPI. Review of Systems  Objective: Vital Signs: There were no vitals taken for this visit.  Specialty Comments:  No specialty comments available.  PMFS History: Patient Active Problem List   Diagnosis Date Noted   Type 2 diabetes mellitus with hyperlipidemia (HCC) 02/19/2024   Chronic deep vein thrombosis (DVT) of left lower extremity (HCC) 02/19/2024   Osteomyelitis (HCC) 02/18/2024   Cellulitis of left ankle 02/18/2024   Leg DVT (deep venous thromboembolism), acute, left (HCC) 09/20/2023   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   Morbid obesity due to excess calories (HCC) 10/09/2022   Tired 05/20/2022   History of breast cancer 05/20/2022   Meralgia paraesthetica 08/10/2021   Encounter for gynecological examination with Papanicolaou smear of cervix 04/22/2021   Encounter for screening fecal occult blood testing 04/22/2021   Sweats, menopausal 03/24/2021   Hot flashes 03/24/2021   Weakness  03/24/2021   Dyspnea on exertion 03/24/2021   Perimenopause 03/24/2021   Degeneration of lumbosacral intervertebral disc 07/31/2020   Congenital pes planus 07/31/2020   Encounter for orthopedic follow-up care 12/14/2019   Acquired trigger finger of left ring finger 11/15/2019   Digital mucinous cyst of finger 11/15/2019   Triggering of finger 10/17/2019   Other obesity due to excess calories 07/19/2019   Chronic kidney disease due to type 2 diabetes mellitus (HCC) 07/19/2019   Secondary hyperparathyroidism (HCC) 07/19/2019   Vitamin D deficiency 07/19/2019   Pain in finger of right hand 06/15/2017   Sprain of interphalangeal joint of right ring finger 06/15/2017   Diabetes 1.5, 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   Idiopathic peripheral neuropathy 09/21/2016   Obesity, class 3 09/21/2016   Obstructive sleep apnea of adult 09/21/2016   Oropharyngeal dysphagia 09/21/2016   Vertigo 09/21/2016   Menorrhagia with irregular cycle 07/25/2014   Iron deficiency anemia due to chronic blood loss 07/25/2014   Encounter for follow-up 05/23/2014   Fibroid, uterine 04/04/2014   Abnormal uterine bleeding (AUB) 03/26/2014   Diabetic peripheral neuropathy (HCC) 12/21/2013   Low back pain 12/11/2013   Pain in joint, shoulder region 12/11/2013   Pain in limb 12/11/2013   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 07/2012   Morbid obesity with BMI of 40.0-44.9, adult (HCC)    Obstructive sleep apnea of adult 2017   Oropharyngeal dysphagia    Peripheral neuropathy    Proteinuria    Secondary hyperparathyroidism (HCC)    Shortness of breath on exertion    Type 2 diabetes mellitus (HCC)    Uterine fibroid    Vertigo 2015   Vitamin D deficiency    Wears dentures     Family History  Problem Relation Age  of Onset   Diabetes Mother    Heart disease Mother    Hypertension Mother    Diabetes Father    Heart disease Father    Hypertension Father    Heart attack Father    Diabetes Sister    Fibromyalgia Sister    Diabetes Sister    Diabetes Brother    Heart attack Maternal Grandmother        <35   Heart attack Maternal Grandfather    Breast cancer Paternal Grandmother 52       breast; dbl mastectomy   Heart attack Paternal Grandfather    Colon polyps Neg Hx    Colon cancer Neg Hx    Crohn's disease Neg Hx    Esophageal cancer Neg Hx    Rectal cancer Neg Hx    Stomach cancer Neg Hx     Past Surgical History:  Procedure Laterality Date   ABLATION     uterine    BREAST BIOPSY Left 2014   benign   BREAST BIOPSY Right 2013   high risk   BREAST BIOPSY Left 2021   benign   BREAST BIOPSY Left 2021   benign   BREAST BIOPSY Right 07/22/2023   US  RT BREAST BX W LOC DEV 1ST LESION IMG BX SPEC US  GUIDE 07/22/2023 [facility]   BREAST EXCISIONAL BIOPSY Left    BREAST LUMPECTOMY Left 2021   LCIS   BREAST LUMPECTOMY WITH NEEDLE LOCALIZATION  08/15/2012   Procedure: BREAST LUMPECTOMY WITH NEEDLE LOCALIZATION;  Surgeon: Sherlean JINNY Laughter, MD;  Location: [facility];  Service: General;  Laterality: Right;  Wire localizations Right breast calcifications   BREAST LUMPECTOMY WITH RADIOACTIVE SEED LOCALIZATION Left 01/30/2020   Procedure: LEFT BREAST LUMPECTOMY X 2 WITH RADIOACTIVE SEED LOCALIZATION;  Surgeon: Vernetta Berg, MD;   Location: [facility];  Service: General;  Laterality: Left;   CARPAL TUNNEL RELEASE Right 11/24/2017   Procedure: RIGHT CARPAL TUNNEL RELEASE;  Surgeon: Gillie Duncans, MD;  Location: [facility];  Service: Neurosurgery;  Laterality: Right;  Right CARPAL TUNNEL RELEASE   CARPAL TUNNEL RELEASE Left 08/31/2018   Procedure: LEFT CARPAL TUNNEL RELEASE;  Surgeon: Gillie Duncans, MD;  Location: [facility];  Service: Neurosurgery;  Laterality: Left;   DILITATION & CURRETTAGE/HYSTROSCOPY WITH THERMACHOICE ABLATION N/A 08/21/2014   Procedure: DILATATION & CURETTAGE/HYSTEROSCOPY WITH THERMACHOICE ABLATION;  Surgeon: Norleen Edsel GAILS, MD;  Location: [facility];  Service: Gynecology;  Laterality: N/A;   KNEE ARTHROSCOPY Left 10/16/2015   Procedure: ARTHROSCOPY KNEE with debridment;  Surgeon: Dempsey Sensor, MD;  Location: [facility];  Service: Orthopedics;  Laterality: Left;   KNEE ARTHROSCOPY WITH MEDIAL MENISECTOMY Right 07/17/2015   Procedure: KNEE ARTHROSCOPY WITH MEDIAL MENISECTOMY;  Surgeon: Dempsey Sensor, MD;  Location:  SURGERY CENTER;  Service: Orthopedics;  Laterality: Right;   POLYPECTOMY N/A 08/21/2014   Procedure: ENDOMETRIAL POLYPECTOMY;  Surgeon: Norleen Edsel GAILS, MD;  Location: [facility];  Service: Gynecology;  Laterality: N/A;   REFRACTIVE SURGERY Right    micro aneuysms   TRIGGER FINGER RELEASE Left    ULNAR NERVE TRANSPOSITION Right 11/24/2017   Procedure: RIGHT ULNAR NERVE DECOMPRESSION/TRANSPOSITION;  Surgeon: Gillie Duncans, MD;  Location[facility];  Service: Neurosurgery;  Laterality: Right;  Right ULNAR NERVE DECOMPRESSION/TRANSPOSITION   Social History   Occupational History   Occupation: care giver    Employer: [employer]  Tobacco Use   Smoking status: Never    Passive exposure: Never   Smokeless  tobacco: Never  Vaping Use   Vaping status: Never Used  Substance and Sexual Activity   Alcohol use: No   Drug use: No   Sexual activity: Not on file

## 2024-06-08 ENCOUNTER — Telehealth: Payer: Self-pay

## 2024-06-08 NOTE — Telephone Encounter (Signed)
 Received a fax from Alicia Coffey - They are asking that you amend your office note from 05/16/24. It needs to state Patient requires left AFO brace due to charcot deformities  Please let me know when the note is amended and I will fax back to [place]. Thank you!

## 2024-06-21 ENCOUNTER — Telehealth: Payer: Self-pay | Admitting: *Deleted

## 2024-06-21 NOTE — Telephone Encounter (Signed)
 error

## 2024-06-23 ENCOUNTER — Other Ambulatory Visit: Payer: Self-pay | Admitting: Nurse Practitioner

## 2024-06-23 DIAGNOSIS — Z1231 Encounter for screening mammogram for malignant neoplasm of breast: Secondary | ICD-10-CM

## 2024-07-03 ENCOUNTER — Ambulatory Visit (INDEPENDENT_AMBULATORY_CARE_PROVIDER_SITE_OTHER): Admitting: Orthopedic Surgery

## 2024-07-03 ENCOUNTER — Encounter: Payer: Self-pay | Admitting: Orthopedic Surgery

## 2024-07-03 ENCOUNTER — Other Ambulatory Visit (INDEPENDENT_AMBULATORY_CARE_PROVIDER_SITE_OTHER): Payer: Self-pay

## 2024-07-03 DIAGNOSIS — E11610 Type 2 diabetes mellitus with diabetic neuropathic arthropathy: Secondary | ICD-10-CM

## 2024-07-03 NOTE — Progress Notes (Signed)
 Office Visit Note   Patient: Alicia Coffey           Date of Birth: 1971/08/06           MRN: [id number] Visit Date: 07/03/2024              Requested by: Lenon Nell SAILOR, FNP [address] [place],  [place] [address] PCP: Lenon Nell SAILOR, FNP  Chief Complaint  Patient presents with   Left Foot - Follow-up      HPI: Discussed the use of AI scribe software for clinical note transcription with the patient, who gave verbal consent to proceed.  History of Present Illness Alicia Coffey is a 53 year old female with advanced Charcot arthropathy who presents for follow-up of her left foot condition.  Her left foot condition is stable with no new symptoms. She uses a kneeling scooter and a CROW walker, and wears compression socks. The foot feels more comfortable with the boot on, and swelling reduces with elevation, especially when she sleeps with the foot elevated.     Assessment & Plan: Visit Diagnoses:  1. Charcot joint of foot due to diabetes [facility])     Plan: Assessment and Plan Assessment & Plan Left foot Charcot arthropathy Advanced Charcot collapse with talar head destruction and medial displacement. Healing stage with no active deformity or signs of progression. - Continue CROW walker and compression socks. - Maintain foot elevation for swelling management. - Follow up in four weeks with repeat 3-view radiographs of the left foot.      Follow-Up Instructions: Return in about 4 weeks (around 07/31/2024).   Ortho Exam  Patient is alert, oriented, no adenopathy, well-dressed, normal affect, normal respiratory effort. Physical Exam CARDIOVASCULAR: Dorsalis pedis pulse present. MUSCULOSKELETAL: No rocker bottom deformity. No redness, warmth, or active Charcot arthropathy. Laxity at Charcot collapse area. SKIN: No open ulcers or callus.  Patient has a palpable dorsalis pedis pulse      Imaging: XR Foot Complete Left Result Date: 07/03/2024 Three-view  radiographs of the left foot shows extensive Charcot destruction with stable alignment no rocker-bottom deformity.  No images are attached to the encounter.  Labs: Lab Results  Component Value Date   HGBA1C 9.5 (H) 02/19/2024   HGBA1C 7.5 (H) 08/23/2018   HGBA1C 8.8 (H) 11/24/2017   ESRSEDRATE 51 (H) 03/14/2024   ESRSEDRATE 55 (H) 02/19/2024   ESRSEDRATE 60 (H) 02/18/2024   CRP 4.4 03/14/2024   CRP 4.5 (H) 02/19/2024   CRP 5.1 (H) 02/18/2024   REPTSTATUS 04/11/2024 FINAL 04/06/2024   CULT  04/06/2024    NO GROWTH 5 DAYS Performed at [facility], [address], [place], [place] [address]      Lab Results  Component Value Date   ALBUMIN 3.9 04/06/2024   ALBUMIN 3.8 12/29/2023   ALBUMIN 3.2 (L) 09/21/2023    No results found for: MG No results found for: VD25OH  No results found for: PREALBUMIN    Latest Ref Rng & Units 04/06/2024   12:18 PM 03/14/2024   10:43 AM 02/21/2024    3:48 AM  CBC EXTENDED  WBC 4.0 - 10.5 K/uL 10.8  11.2  9.1   RBC 3.87 - 5.11 MIL/uL 4.59  4.11  3.82   Hemoglobin 12.0 - 15.0 g/dL 87.2  88.5  89.4   HCT 36.0 - 46.0 % 41.0  36.1  33.8   Platelets 150 - 400 K/uL 218  312  219   NEUT#  1.7 - 7.7 K/uL 7.1  7,011    Lymph# 0.7 - 4.0 K/uL 2.0        There is no height or weight on file to calculate BMI.  Orders:  Orders Placed This Encounter  Procedures   XR Foot Complete Left   No orders of the defined types were placed in this encounter.    Procedures: No procedures performed  Clinical Data: No additional findings.  ROS:  All other systems negative, except as noted in the HPI. Review of Systems  Objective: Vital Signs: There were no vitals taken for this visit.  Specialty Comments:  No specialty comments available.  PMFS History: Patient Active Problem List   Diagnosis Date Noted   Type 2 diabetes mellitus with hyperlipidemia (HCC) 02/19/2024   Chronic deep vein thrombosis (DVT) of left lower extremity (HCC)  02/19/2024   Osteomyelitis (HCC) 02/18/2024   Cellulitis of left ankle 02/18/2024   Leg DVT (deep venous thromboembolism), acute, left (HCC) 09/20/2023   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   Morbid obesity due to excess calories (HCC) 10/09/2022   Tired 05/20/2022   History of breast cancer 05/20/2022   Meralgia paraesthetica 08/10/2021   Encounter for gynecological examination with Papanicolaou smear of cervix 04/22/2021   Encounter for screening fecal occult blood testing 04/22/2021   Sweats, menopausal 03/24/2021   Hot flashes 03/24/2021   Weakness 03/24/2021   Dyspnea on exertion 03/24/2021   Perimenopause 03/24/2021   Degeneration of lumbosacral intervertebral disc 07/31/2020   Congenital pes planus 07/31/2020   Encounter for orthopedic follow-up care 12/14/2019   Acquired trigger finger of left ring finger 11/15/2019   Digital mucinous cyst of finger 11/15/2019   Triggering of finger 10/17/2019   Other obesity due to excess calories 07/19/2019   Chronic kidney disease due to type 2 diabetes mellitus (HCC) 07/19/2019   Secondary hyperparathyroidism (HCC) 07/19/2019   Vitamin D deficiency 07/19/2019   Pain in finger of right hand 06/15/2017   Sprain of interphalangeal joint of right ring finger 06/15/2017   Diabetes 1.5, 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   Idiopathic peripheral neuropathy 09/21/2016   Obesity, class 3 09/21/2016   Obstructive sleep apnea of adult 09/21/2016   Oropharyngeal dysphagia 09/21/2016   Vertigo 09/21/2016   Menorrhagia with irregular cycle 07/25/2014   Iron deficiency anemia due to chronic blood loss 07/25/2014   Encounter for follow-up 05/23/2014   Fibroid, uterine 04/04/2014   Abnormal uterine bleeding (AUB) 03/26/2014   Diabetic peripheral neuropathy (HCC) 12/21/2013   Low back pain 12/11/2013   Pain in joint, shoulder region 12/11/2013   Pain in limb 12/11/2013   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   Morbid obesity with BMI of 40.0-44.9, adult (HCC)  Obstructive sleep apnea of adult 2017   Oropharyngeal dysphagia    Peripheral neuropathy    Proteinuria    Secondary hyperparathyroidism (HCC)    Shortness of breath on exertion    Type 2 diabetes mellitus (HCC)    Uterine fibroid    Vertigo 2015   Vitamin D deficiency    Wears dentures     Family History  Problem Relation Age of Onset   Diabetes Mother    Heart disease Mother    Hypertension Mother    Diabetes Father    Heart disease Father    Hypertension Father    Heart attack Father    Diabetes Sister    Fibromyalgia Sister    Diabetes Sister    Diabetes Brother    Heart attack Maternal Grandmother        <35   Heart attack Maternal Grandfather    Breast cancer Paternal Grandmother 30       breast; dbl mastectomy   Heart attack Paternal Grandfather    Colon polyps Neg Hx    Colon cancer Neg Hx    Crohn's disease Neg Hx    Esophageal cancer Neg Hx    Rectal cancer Neg Hx    Stomach cancer Neg Hx     Past Surgical History:  Procedure Laterality Date   ABLATION     uterine    BREAST BIOPSY Left 2014   benign   BREAST BIOPSY Right 2013   high risk   BREAST BIOPSY Left 2021   benign   BREAST BIOPSY Left 2021   benign   BREAST BIOPSY Right 07/22/2023   US  RT BREAST BX W LOC DEV 1ST LESION  IMG BX SPEC US  GUIDE 07/22/2023 [facility]   BREAST EXCISIONAL BIOPSY Left    BREAST LUMPECTOMY Left 2021   LCIS   BREAST LUMPECTOMY WITH NEEDLE LOCALIZATION  08/15/2012   Procedure: BREAST LUMPECTOMY WITH NEEDLE LOCALIZATION;  Surgeon: Sherlean JINNY Laughter, MD;  Location: [facility];  Service: General;  Laterality: Right;  Wire localizations Right breast calcifications   BREAST LUMPECTOMY WITH RADIOACTIVE SEED LOCALIZATION Left 01/30/2020   Procedure: LEFT BREAST LUMPECTOMY X 2 WITH RADIOACTIVE SEED LOCALIZATION;  Surgeon: Vernetta Berg, MD;  Location: [facility];  Service: General;  Laterality: Left;   CARPAL TUNNEL RELEASE Right 11/24/2017   Procedure: RIGHT CARPAL TUNNEL RELEASE;  Surgeon: Gillie Duncans, MD;  Location[facility];  Service: Neurosurgery;  Laterality: Right;  Right CARPAL TUNNEL RELEASE   CARPAL TUNNEL RELEASE Left 08/31/2018   Procedure: LEFT CARPAL TUNNEL RELEASE;  Surgeon: Gillie Duncans, MD;  Location: [facility];  Service: Neurosurgery;  Laterality: Left;   DILITATION & CURRETTAGE/HYSTROSCOPY WITH THERMACHOICE ABLATION N/A 08/21/2014   Procedure: DILATATION & CURETTAGE/HYSTEROSCOPY WITH THERMACHOICE ABLATION;  Surgeon: Norleen Edsel GAILS, MD;  Location: [facility];  Service: Gynecology;  Laterality: N/A;   KNEE ARTHROSCOPY Left 10/16/2015   Procedure: ARTHROSCOPY KNEE with debridment;  Surgeon: Dempsey Sensor, MD;  Location: [facility];  Service: Orthopedics;  Laterality: Left;   KNEE ARTHROSCOPY WITH MEDIAL MENISECTOMY Right 07/17/2015   Procedure: KNEE ARTHROSCOPY WITH MEDIAL MENISECTOMY;  Surgeon: Dempsey Sensor, MD;  Location: [facility];  Service: Orthopedics;  Laterality: Right;   POLYPECTOMY N/A 08/21/2014   Procedure: ENDOMETRIAL POLYPECTOMY;  Surgeon: Norleen Edsel GAILS, MD;  Location: [facility];  Service: Gynecology;  Laterality: N/A;   REFRACTIVE SURGERY Right    micro aneuysms   TRIGGER FINGER RELEASE Left  ULNAR  NERVE TRANSPOSITION Right 11/24/2017   Procedure: RIGHT ULNAR NERVE DECOMPRESSION/TRANSPOSITION;  Surgeon: Gillie Duncans, MD;  Location: [facility];  Service: Neurosurgery;  Laterality: Right;  Right ULNAR NERVE DECOMPRESSION/TRANSPOSITION   Social History   Occupational History   Occupation: care giver    Employer: [employer]  Tobacco Use   Smoking status: Never    Passive exposure: Never   Smokeless tobacco: Never  Vaping Use   Vaping status: Never Used  Substance and Sexual Activity   Alcohol use: No   Drug use: No   Sexual activity: Not on file

## 2024-07-12 ENCOUNTER — Ambulatory Visit (INDEPENDENT_AMBULATORY_CARE_PROVIDER_SITE_OTHER): Admitting: Podiatry

## 2024-07-12 DIAGNOSIS — M216X2 Other acquired deformities of left foot: Secondary | ICD-10-CM

## 2024-07-12 DIAGNOSIS — E0843 Diabetes mellitus due to underlying condition with diabetic autonomic (poly)neuropathy: Secondary | ICD-10-CM

## 2024-07-12 DIAGNOSIS — M14672 Charcot's joint, left ankle and foot: Secondary | ICD-10-CM

## 2024-07-12 NOTE — Progress Notes (Unsigned)
 Subjective:  Patient ID: Alicia Coffey, female    DOB: 03-24-1971,  MRN: [id number]  Chief Complaint  Patient presents with   Charcot's joint of foot, left    Pt stated that she is here for a follow up she stated that she had an xray done on 07/03/24 she has no concerns at this time     53 y.o. female presents with the above complaint.  P patient presents for follow-up of left foot deformity.  She states that she is doing better she has been walking with a Crow walker denies any other acute complaints.  Review of Systems: Negative except as noted in the HPI. Denies N/V/F/Ch.  Past Medical History:  Diagnosis Date   Anemia    Anxiety    Arthritis    Right knee   CKD (chronic kidney disease) stage 2, GFR 60-89 ml/min    Dr. 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   Morbid obesity with BMI of 40.0-44.9, adult (HCC)    Obstructive sleep apnea of adult 2017   Oropharyngeal dysphagia    Peripheral neuropathy    Proteinuria    Secondary hyperparathyroidism    Shortness of breath on exertion    Type 2 diabetes mellitus (HCC)    Uterine fibroid    Vertigo 2015   Vitamin D deficiency    Wears dentures     Current Outpatient Medications:    acetaminophen  (TYLENOL ) 500 MG tablet, Take 1,000 mg by mouth every 6 (six) hours as needed for mild pain or headache., Disp: , Rfl:    albuterol (PROVENTIL HFA;VENTOLIN HFA) 108 (90 Base) MCG/ACT inhaler, Inhale 2 puffs into the lungs every 6 (six) hours as needed for wheezing or shortness of breath., Disp: , Rfl:    apixaban  (ELIQUIS ) 5 MG TABS tablet, Take 1 tablet (5 mg total) by mouth 2 (two) times daily., Disp: 60 tablet, Rfl: 4   aspirin  EC 81 MG tablet,  Take 81 mg by mouth at bedtime. Swallow whole., Disp: , Rfl:    B Complex-C-Folic Acid  (B-COMPLEX BALANCED PO), Take 1 tablet by mouth daily at 12 noon., Disp: , Rfl:    bumetanide (BUMEX) 0.5 MG tablet, Take 0.25 mg by mouth 2 (two) times daily., Disp: , Rfl:    cholecalciferol (VITAMIN D3) 25 MCG (1000 UNIT) tablet, Take 1,000 Units by mouth daily at 12 noon., Disp: , Rfl:    ciclopirox (LOPROX) 0.77 % cream, Apply 1 application topically 2 (two) times daily as needed (itching)., Disp: , Rfl:    CRANBERRY FRUIT PO, Take 1 capsule by mouth daily at 12 noon., Disp: , Rfl:    Docusate Sodium (DSS) 100 MG CAPS, Take 1 capsule by mouth 2 (two) times daily., Disp: , Rfl:    DULoxetine  (CYMBALTA ) 60 MG capsule, Take 1 capsule (60 mg total) by mouth daily. (Patient taking differently: Take 60 mg by mouth 2 (two) times daily.), Disp: 30 capsule, Rfl: 12   Empagliflozin-metFORMIN HCl ER (SYNJARDY XR) 25-1000 MG TB24, Take 1 tablet by mouth at bedtime., Disp: , Rfl:    Finerenone (KERENDIA) 10 MG TABS, Take 10 mg by mouth every  Monday, Wednesday, and Friday. On Monday, Wednesday, & Friday, Disp: , Rfl:    GNP MAGNESIUM OXIDE 250 MG TABS, Take 1 tablet by mouth daily., Disp: , Rfl:    hydrALAZINE  (APRESOLINE ) 100 MG tablet, Take 100 mg by mouth 2 (two) times daily., Disp: , Rfl:    hydrocortisone  cream 1 %, Apply to affected area 2 times daily (Patient taking differently: as needed for itching. Apply to affected area 2 times daily), Disp: 15 g, Rfl: 0   hydrOXYzine  (ATARAX /VISTARIL ) 25 MG tablet, Take 25 mg by mouth daily at 12 noon., Disp: , Rfl:    insulin  glargine (LANTUS ) 100 UNIT/ML injection, Inject 40 Units into the skin at bedtime., Disp: , Rfl:    irbesartan  (AVAPRO ) 150 MG tablet, TAKE 1 TABLET BY MOUTH EVERY DAY (Patient taking differently: Take 150 mg by mouth at bedtime.), Disp: 30 tablet, Rfl: 11   metoprolol  tartrate (LOPRESSOR ) 25 MG tablet, Take 25 mg by mouth 2 (two) times daily., Disp: ,  Rfl:    NOVOLOG  FLEXPEN 100 UNIT/ML FlexPen, Inject 2-15 Units into the skin 3 (three) times daily with meals. Per sliding scale for levels that exceeds 121-150., Disp: , Rfl:    OZEMPIC, 1 MG/DOSE, 4 MG/3ML SOPN, Inject 1 mg into the skin every Sunday., Disp: , Rfl:    POLY-IRON 150 150 MG capsule, Take 150 mg by mouth 2 (two) times daily., Disp: , Rfl:    rosuvastatin  (CRESTOR ) 40 MG tablet, Take 40 mg by mouth at bedtime. , Disp: , Rfl:    traMADol (ULTRAM) 50 MG tablet, Take 50 mg by mouth every morning., Disp: , Rfl:   Social History   Tobacco Use  Smoking Status Never   Passive exposure: Never  Smokeless Tobacco Never    Allergies  Allergen Reactions   Penicillins Other (See Comments)    PATIENT HAS HAD A PCN REACTION WITH IMMEDIATE RASH, FACIAL/TONGUE/THROAT SWELLING, SOB, OR LIGHTHEADEDNESS WITH HYPOTENSION:  #  #  YES  #  #  Has patient had a PCN reaction causing severe rash involving mucus membranes or skin necrosis: No Has patient had a PCN reaction that required hospitalization No Has patient had a PCN reaction occurring within the last 10 years: No    Bee Venom Dermatitis, Itching and Other (See Comments)   Latex    Other Hives, Itching and Rash    Powder in gloves Arthropod Insect   Objective:  There were no vitals filed for this visit. There is no height or weight on file to calculate BMI. Constitutional Well developed. Well nourished.  Vascular Dorsalis pedis pulses palpable bilaterally. Posterior tibial pulses palpable bilaterally. Capillary refill normal to all digits.  No cyanosis or clubbing noted. Pedal hair growth normal.  Neurologic Normal speech. Oriented to person, place, and time. Epicritic sensation to light touch grossly present bilaterally.  Dermatologic Nails well groomed and normal in appearance. No open wounds. No skin lesions.  Orthopedic: No further warmth noted to the left foot.  Palpable pulses noted.  Charcot deformity clinically  appreciated appreciated with rocker-bottom foot deformity.  No wounds or lesion noted.  No prominent bony spots noted.   Radiographs: 3 views of skeletally mature adult left foot: Weight: Hide foot breakdown noted at the talonavicular joint calcaneocuboid joint with multiple breakdown and sclerosis.  Lisfranc interval is intact.  No open wounds or lesions noted Assessment:  No diagnosis found. Plan:  Patient was evaluated and treated and all questions answered.  Left Charcot foot deformity  with underlying history of diabetes - All questions and concerns were discussed with the patient in extensive detail.  Clinically patient is able to manage the Charcot deformity with a Omelia walker she has been wearing it daily.  Currently patient does not have any flareup or symptoms of Charcot deformity.  I discussed with him to continue wearing Crow walker daily.  She agrees with the plan. -If any foot and ankle issues in the future she will come back and see me but I will see her in 6 months for further checkup.

## 2024-07-18 ENCOUNTER — Ambulatory Visit
Admission: RE | Admit: 2024-07-18 | Discharge: 2024-07-18 | Disposition: A | Discharge status: Home / Self Care | Source: Ambulatory Visit | Attending: Nurse Practitioner | Admitting: Nurse Practitioner

## 2024-07-18 DIAGNOSIS — Z1231 Encounter for screening mammogram for malignant neoplasm of breast: Secondary | ICD-10-CM

## 2024-07-20 ENCOUNTER — Encounter: Payer: Self-pay | Admitting: Pulmonary Disease

## 2024-07-20 ENCOUNTER — Ambulatory Visit: Admitting: Pulmonary Disease

## 2024-07-20 VITALS — BP 118/78 | HR 79 | Temp 98.6°F | Ht 63.0 in | Wt 240.0 lb

## 2024-07-20 DIAGNOSIS — J984 Other disorders of lung: Secondary | ICD-10-CM | POA: Diagnosis not present

## 2024-07-20 DIAGNOSIS — J849 Interstitial pulmonary disease, unspecified: Secondary | ICD-10-CM

## 2024-07-20 NOTE — Patient Instructions (Signed)
  VISIT SUMMARY: You came in for a follow-up on your cystic lung disease and discussed your recent CT scan results. We also talked about your fatigue and swelling in your foot.  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 All previous tests have been negative, and your breathing is stable. -We will order another CT scan in six months to monitor your condition. -There is no need to repeat the lung function test at this time.

## 2024-07-20 NOTE — Progress Notes (Signed)
 Alicia Coffey    [id number]    [birth year]  Primary Care Physician:Anderson, Nell SAILOR, FNP  Referring Physician: Lenon Nell SAILOR, FNP [address] [place],  [place] [address]  Chief complaint: Follow-up for cystic lung disease  HPI: 53 y.o. 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  Noted incidentally on CT scan She has mild dyspnea on exertion.  Denies any cough, sputum production, fevers or chills.  She has no family history of lung issues or history of pneumothoraces.   Interim history Discussed the use of AI scribe software for clinical note transcription with the patient, who gave verbal consent to proceed. History of Present Illness Alicia Coffey is a 53 year old female with cystic lung disease who presents for follow-up of an abnormal CT scan.  Cystic lung disease - Under observation for cystic lung disease, previously identified on CT scan - All prior workups have been negative - VEGF-D lab test sent to the [facility] is pending and we have been unable to retrieve it - Respiratory status remains stable  Fatigue - Occasional increased fatigue - Fatigue is exacerbated by swelling in the foot and  use of a non-motorized scooter  Peripheral edema - Swelling in the foot contributing to increased fatigue.  Follows with orthopedics for left Charcot's foot    Relevant pulmonary history Pets: No pets Occupation: Retired Lawyer Exposures: No exposures.  No mold, hot tub, Jacuzzi.  No feather pillows or comforters Smoking history: Never smoker Travel history: No significant travel history Relevant family history: No family history of lung disease  Outpatient Encounter Medications as of 07/20/2024  Medication Sig   acetaminophen  (TYLENOL ) 500 MG tablet Take 1,000 mg by mouth every 6 (six) hours as needed for mild pain or headache.   albuterol (PROVENTIL HFA;VENTOLIN HFA) 108 (90 Base) MCG/ACT inhaler Inhale 2 puffs into the lungs every 6 (six) hours as needed for wheezing or shortness of breath.   apixaban  (ELIQUIS ) 5 MG TABS tablet Take 1 tablet (5 mg total) by mouth 2 (two) times daily.   aspirin  EC 81 MG tablet Take 81 mg by mouth at bedtime. Swallow whole.   bumetanide (BUMEX) 0.5 MG tablet Take 0.25 mg by mouth 2 (two) times daily.   cholecalciferol (VITAMIN D3) 25 MCG (1000 UNIT) tablet Take 1,000 Units by mouth daily at 12 noon.   ciclopirox (LOPROX) 0.77 % cream Apply 1 application topically 2 (two) times daily as needed (itching).   CRANBERRY FRUIT PO Take 1 capsule by mouth daily at 12 noon.   DULoxetine  (CYMBALTA ) 60 MG capsule Take 1 capsule (60 mg total) by mouth daily.   Empagliflozin-metFORMIN HCl ER (SYNJARDY XR) 25-1000 MG TB24 Take 1 tablet by mouth at bedtime.   Finerenone (KERENDIA) 10 MG TABS Take 10 mg by mouth every Monday, Wednesday, and Friday. On Monday, Wednesday, &  Friday (Patient taking differently: Take 10 mg by mouth every Monday, Wednesday, and Friday. Taking 20mg  daily)   GNP MAGNESIUM OXIDE 250 MG TABS Take 1 tablet by mouth daily.   hydrALAZINE  (APRESOLINE ) 100 MG tablet Take 100 mg by mouth 2 (two) times daily.   hydrocortisone  cream 1 % Apply to affected  area 2 times daily   hydrOXYzine  (ATARAX /VISTARIL ) 25 MG tablet Take 25 mg by mouth daily at 12 noon.   insulin  glargine (LANTUS ) 100 UNIT/ML injection Inject 40 Units into the skin at bedtime.   irbesartan  (AVAPRO ) 150 MG tablet TAKE 1 TABLET BY MOUTH EVERY DAY   metoprolol  tartrate (LOPRESSOR ) 25 MG tablet Take 25 mg by mouth 2 (two) times daily.   mupirocin ointment (BACTROBAN) 2 % SMARTSIG:sparingly Topical 3 Times Daily   NOVOLOG  FLEXPEN 100 UNIT/ML FlexPen Inject 2-15 Units into the skin 3 (three) times daily with meals. Per sliding scale for levels that exceeds 121-150.   OZEMPIC, 1 MG/DOSE, 4 MG/3ML SOPN Inject 1 mg into the skin every Sunday.   POLY-IRON 150 150 MG capsule Take 150 mg by mouth 2 (two) times daily.   rosuvastatin  (CRESTOR ) 40 MG tablet Take 40 mg by mouth at bedtime.    traMADol (ULTRAM) 50 MG tablet Take 50 mg by mouth every morning.   B Complex-C-Folic Acid  (B-COMPLEX BALANCED PO) Take 1 tablet by mouth daily at 12 noon.   clobetasol ointment (TEMOVATE) 0.05 % apply 1 application to bug bite on leg twice daily   Docusate Sodium (DSS) 100 MG CAPS Take 1 capsule by mouth 2 (two) times daily.   nystatin ointment (MYCOSTATIN) APPLY TO THE AFFECTED AREA(S) TOPICALLY 2 TIMES PER DAY   No facility-administered encounter medications on file as of 07/20/2024.   Physical Exam: Blood pressure (!) 160/86, pulse 80, height 5' 3.5 (1.613 m), weight 247 lb 6.4 oz (112.2 kg), SpO2 98%. Gen:      No acute distress HEENT:  EOMI, sclera anicteric Neck:     No masses; no thyromegaly Lungs:    Clear to auscultation bilaterally; normal respiratory effort CV:         Regular rate and rhythm; no murmurs Abd:      + bowel sounds; soft, non-tender; no palpable masses, no distension Ext:    No edema; adequate peripheral perfusion Skin:      Warm and dry; no rash Neuro: alert and oriented x 3 Psych: normal mood and affect   Data Reviewed: Imaging: CT chest 03/10/2023-multiple scattered  thin-walled cysts with no underlying interstitial lung disease.  Minimal scarring of the medial right lower lobe. CT abdomen pelvis 05/05/2023-5 cm nonspecific right adrenal mass, 2.8 cm benign appearing right adnexal cyst MRI abdomen 07/20/2023-benign adrenal adenoma CT chest 11/01/2023-stable pulmonary cysts I reviewed the images personally  PFTs: 04/22/2023 FVC 2.00 [58%], FEV1 1.81 [69%], F/F90, TLC 4.50 [90%], DLCO 14.24 [69%] Mild diffusion defect  04/28/2024 FVC 2.19 [64%], FEV1 1.84 [69%], F/F84, TLC 3.50 [70%], DLCO 15.02 [73%] Moderate restriction, mild diffusion defect  Labs: Labs 04/23/2023-SSA, SSB, rheumatoid factor, ANA-negative Alpha-1 antitrypsin levels and phenotype- normal PIMM VEGF_D levels sent to [facility] and we are unable to get results  Assessment & Plan Cystic lung disease High-res CT shows multiple thin-walled cysts without any underlying interstitial changes.  This may be bland, nonspecific or possibly lymphangiomyomatosis  Cystic lung disease appears benign and well-managed based on the last CT and PFTs conducted earlier this year. All workup has been negative, and the  VEGFD lab sent to the [facility] has not been returned, but it is not deemed necessary to resend. No associated kidney issues on CT abdomen. Mild changes in lung function tests are present but not concerning. Breathing is reported to be satisfactory.  - Order CT scan in six months to monitor cystic lung disease - Do not repeat lung function test at this time as she would like to avoid this test if possible   I personally spent a total of 30 minutes in the care of the patient today including preparing to see the patient, getting/reviewing separately obtained history, counseling and educating, and placing orders.   Plan/Recommendations: High-res CT in 6 months  Lonna Coder MD [facility] 07/20/2024, 3:23 PM  CC: Lenon Nell SAILOR, FNP

## 2024-07-31 ENCOUNTER — Ambulatory Visit: Admitting: Orthopedic Surgery

## 2024-08-01 ENCOUNTER — Other Ambulatory Visit: Payer: Self-pay

## 2024-08-01 ENCOUNTER — Ambulatory Visit (INDEPENDENT_AMBULATORY_CARE_PROVIDER_SITE_OTHER): Admitting: Orthopedic Surgery

## 2024-08-01 DIAGNOSIS — M79672 Pain in left foot: Secondary | ICD-10-CM

## 2024-08-01 DIAGNOSIS — E11610 Type 2 diabetes mellitus with diabetic neuropathic arthropathy: Secondary | ICD-10-CM

## 2024-08-03 ENCOUNTER — Encounter: Payer: Self-pay | Admitting: Orthopedic Surgery

## 2024-08-03 NOTE — Progress Notes (Signed)
 Office Visit Note   Patient: Alicia Coffey           Date of Birth: 19-Feb-1971           MRN: [id number] Visit Date: 08/01/2024              Requested by: Lenon Nell SAILOR, FNP [address] [place],  [place] [address] PCP: Lenon Nell SAILOR, FNP  Chief Complaint  Patient presents with   Left Foot - Follow-up      HPI: Discussed the use of AI scribe software for clinical note transcription with the patient, who gave verbal consent to proceed.  History of Present Illness Alicia Coffey is a 53 year old female with Charcot foot who presents for follow-up of her left foot condition.  Her left foot is slightly swollen due to increased activity during a recent vacation, although she did not engage in as much activity as others. The swelling is manageable, and she describes her foot as doing 'pretty good'.  She is currently using a Omelia Vannie seaman, which she finds effective and 'a hundred percent better' than previous devices. She is satisfied with the boot, noting it fits well and provides good support. She still uses a cane for balance when navigating stairs.  The Velcro on the boot sometimes requires cleaning to maintain its stickiness. Despite this, she is 'happy with the boot' and finds it comfortable for walking.  She continues to wear compression socks and an elastic support for additional stability.     Assessment & Plan: Visit Diagnoses:  1. 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 Slight swelling from increased activity. Omelia Vannie boot provides support, some instability persists. No active Charcot process. - Continue Omelia Vannie boot. - Follow up in two months with repeat three-view radiographs of the left foot.      Follow-Up Instructions: No follow-ups on file.   Ortho  Exam  Patient is alert, oriented, no adenopathy, well-dressed, normal affect, normal respiratory effort. Physical Exam EXTREMITIES: Left foot normal temperature, no warmth, no active Charcot process. Left foot and ankle instability, requires Omelia Vannie for support. Wearing compression socks and elastic support.      Imaging: No results found. No images are attached to the encounter.  Labs: Lab Results  Component Value Date   HGBA1C 9.5 (H) 02/19/2024   HGBA1C 7.5 (H) 08/23/2018   HGBA1C 8.8 (H) 11/24/2017   ESRSEDRATE 51 (H) 03/14/2024   ESRSEDRATE 55 (H) 02/19/2024   ESRSEDRATE 60 (H) 02/18/2024   CRP 4.4 03/14/2024   CRP 4.5 (H) 02/19/2024   CRP 5.1 (H) 02/18/2024   REPTSTATUS 04/11/2024 FINAL 04/06/2024   CULT  04/06/2024    NO GROWTH 5 DAYS Performed at [facility], [address] [place], [place] [address]      Lab Results  Component Value Date   ALBUMIN 3.9 04/06/2024   ALBUMIN 3.8 12/29/2023   ALBUMIN 3.2 (L) 09/21/2023    No results found for: MG No results found for: VD25OH  No results found for: PREALBUMIN    Latest Ref Rng & Units 04/06/2024   12:18 PM 03/14/2024   10:43 AM 02/21/2024    3:48 AM  CBC EXTENDED  WBC 4.0 - 10.5 K/uL 10.8  11.2  9.1   RBC 3.87 -  5.11 MIL/uL 4.59  4.11  3.82   Hemoglobin 12.0 - 15.0 g/dL 87.2  88.5  89.4   HCT 36.0 - 46.0 % 41.0  36.1  33.8   Platelets 150 - 400 K/uL 218  312  219   NEUT# 1.7 - 7.7 K/uL 7.1  7,011    Lymph# 0.7 - 4.0 K/uL 2.0        There is no height or weight on file to calculate BMI.  Orders:  Orders Placed This Encounter  Procedures   XR Foot Complete Left   No orders of the defined types were placed in this encounter.    Procedures: No procedures performed  Clinical Data: No additional findings.  ROS:  All other systems negative, except as noted in the HPI. Review of Systems  Objective: Vital Signs: There were no vitals taken for this visit.  Specialty Comments:   No specialty comments available.  PMFS History: Patient Active Problem List   Diagnosis Date Noted   Type 2 diabetes mellitus with hyperlipidemia (HCC) 02/19/2024   Chronic deep vein thrombosis (DVT) of left lower extremity (HCC) 02/19/2024   Osteomyelitis (HCC) 02/18/2024   Cellulitis of left ankle 02/18/2024   Leg DVT (deep venous thromboembolism), acute, left (HCC) 09/20/2023   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   Morbid obesity due to excess calories (HCC) 10/09/2022   Tired 05/20/2022   History of breast cancer 05/20/2022   Meralgia paraesthetica 08/10/2021   Encounter for gynecological examination with Papanicolaou smear of cervix 04/22/2021   Encounter for screening fecal occult blood testing 04/22/2021   Sweats, menopausal 03/24/2021   Hot flashes 03/24/2021   Weakness 03/24/2021   Dyspnea on exertion 03/24/2021   Perimenopause 03/24/2021   Degeneration of lumbosacral intervertebral disc 07/31/2020   Congenital pes planus 07/31/2020   Encounter for orthopedic follow-up care 12/14/2019   Acquired trigger finger of left ring finger 11/15/2019   Digital mucinous cyst of finger 11/15/2019   Triggering of finger 10/17/2019   Other obesity due to excess calories 07/19/2019   Chronic kidney disease due to type 2 diabetes mellitus (HCC) 07/19/2019   Secondary hyperparathyroidism 07/19/2019   Vitamin D deficiency 07/19/2019   Pain in finger of right hand 06/15/2017   Sprain of interphalangeal joint of right ring finger 06/15/2017   Diabetes 1.5, 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   Idiopathic peripheral neuropathy 09/21/2016   Obesity, class 3 (HCC) 09/21/2016   Obstructive sleep apnea of adult 09/21/2016   Oropharyngeal dysphagia 09/21/2016   Vertigo 09/21/2016   Menorrhagia with irregular cycle 07/25/2014   Iron deficiency anemia due to chronic blood loss 07/25/2014   Encounter for follow-up 05/23/2014   Fibroid, uterine 04/04/2014   Abnormal uterine  bleeding (AUB) 03/26/2014   Diabetic peripheral neuropathy (HCC) 12/21/2013   Low back pain 12/11/2013   Pain in joint, shoulder region 12/11/2013   Pain in limb 12/11/2013   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 07/2012   Morbid obesity with BMI of 40.0-44.9, adult (HCC)    Obstructive sleep apnea of adult 2017   Oropharyngeal dysphagia    Peripheral neuropathy    Proteinuria    Secondary hyperparathyroidism    Shortness of breath on exertion    Type 2 diabetes mellitus (HCC)    Uterine fibroid    Vertigo 2015   Vitamin D deficiency    Wears dentures     Family History  Problem Relation Age of Onset   Diabetes Mother    Heart disease Mother    Hypertension Mother    Diabetes Father    Heart disease Father    Hypertension Father    Heart attack Father    Diabetes Sister    Fibromyalgia Sister    Diabetes Sister    Diabetes Brother    Heart attack Maternal Grandmother        <35   Heart attack Maternal Grandfather    Breast cancer Paternal Grandmother 4       breast; dbl mastectomy   Heart attack Paternal Grandfather    Colon polyps Neg Hx    Colon cancer Neg Hx    Crohn's disease Neg Hx    Esophageal cancer Neg Hx    Rectal cancer Neg Hx    Stomach cancer Neg Hx     Past Surgical History:  Procedure Laterality Date   ABLATION      uterine    BREAST BIOPSY Left 2014   benign   BREAST BIOPSY Right 2013   high risk   BREAST BIOPSY Left 2021   benign   BREAST BIOPSY Left 2021   benign   BREAST BIOPSY Right 07/22/2023   US  RT BREAST BX W LOC DEV 1ST LESION IMG BX SPEC US  GUIDE 07/22/2023 [facility]   BREAST EXCISIONAL BIOPSY Left    BREAST LUMPECTOMY Left 2021   LCIS   BREAST LUMPECTOMY WITH NEEDLE LOCALIZATION  08/15/2012   Procedure: BREAST LUMPECTOMY WITH NEEDLE LOCALIZATION;  Surgeon: Sherlean JINNY Laughter, MD;  Location: [facility];  Service: General;  Laterality: Right;  Wire localizations Right breast calcifications   BREAST LUMPECTOMY WITH RADIOACTIVE SEED LOCALIZATION Left 01/30/2020   Procedure: LEFT BREAST LUMPECTOMY X 2 WITH RADIOACTIVE SEED LOCALIZATION;  Surgeon: Vernetta Berg, MD;  Location: [facility];  Service: General;  Laterality: Left;   CARPAL TUNNEL RELEASE Right 11/24/2017   Procedure: RIGHT CARPAL TUNNEL RELEASE;  Surgeon: Gillie Duncans, MD;  Location: [facility];  Service: Neurosurgery;  Laterality: Right;  Right CARPAL TUNNEL RELEASE   CARPAL TUNNEL RELEASE Left 08/31/2018   Procedure: LEFT CARPAL TUNNEL RELEASE;  Surgeon: Gillie Duncans, MD;  Location: [facility];  Service: Neurosurgery;  Laterality: Left;   DILITATION & CURRETTAGE/HYSTROSCOPY WITH THERMACHOICE ABLATION N/A 08/21/2014   Procedure: DILATATION & CURETTAGE/HYSTEROSCOPY WITH THERMACHOICE ABLATION;  Surgeon: Norleen Edsel GAILS, MD;  Location: [facility];  Service: Gynecology;  Laterality: N/A;   KNEE ARTHROSCOPY Left 10/16/2015   Procedure: ARTHROSCOPY KNEE with debridment;  Surgeon: Dempsey Sensor, MD;  Location: [facility];  Service: Orthopedics;  Laterality: Left;   KNEE ARTHROSCOPY WITH MEDIAL MENISECTOMY Right 07/17/2015   Procedure: KNEE ARTHROSCOPY WITH MEDIAL MENISECTOMY;  Surgeon: Dempsey Sensor, MD;  Location: [facility];  Service: Orthopedics;  Laterality: Right;    POLYPECTOMY N/A 08/21/2014   Procedure: ENDOMETRIAL POLYPECTOMY;  Surgeon: Norleen Edsel GAILS, MD;  Location: [facility];  Service: Gynecology;  Laterality: N/A;   REFRACTIVE SURGERY Right    micro aneuysms   TRIGGER FINGER RELEASE Left    ULNAR NERVE TRANSPOSITION Right 11/24/2017   Procedure: RIGHT ULNAR NERVE DECOMPRESSION/TRANSPOSITION;  Surgeon: Gillie Duncans, MD;  Location: [facility];  Service: Neurosurgery;  Laterality: Right;  Right ULNAR NERVE DECOMPRESSION/TRANSPOSITION   Social History   Occupational History   Occupation: care giver    Employer: [employer]  Tobacco Use   Smoking status: Never    Passive exposure: Never   Smokeless tobacco: Never  Vaping Use   Vaping status: Never Used  Substance and Sexual Activity   Alcohol use: No   Drug use: No   Sexual activity: Not on file

## 2024-08-09 ENCOUNTER — Other Ambulatory Visit: Payer: Self-pay | Admitting: Internal Medicine

## 2024-08-09 NOTE — Telephone Encounter (Signed)
 Pt is requesting refill of Irbesartan , not mentioned in LOV notes whether to continue or d/c this medication. Please advise, thank you!

## 2024-08-14 ENCOUNTER — Encounter: Payer: Self-pay | Admitting: Radiology

## 2024-08-28 ENCOUNTER — Inpatient Hospital Stay: Attending: Hematology and Oncology | Admitting: Hematology and Oncology

## 2024-08-28 DIAGNOSIS — Z86718 Personal history of other venous thrombosis and embolism: Secondary | ICD-10-CM | POA: Diagnosis not present

## 2024-08-28 DIAGNOSIS — I825Y2 Chronic embolism and thrombosis of unspecified deep veins of left proximal lower extremity: Secondary | ICD-10-CM

## 2024-08-28 NOTE — Assessment & Plan Note (Signed)
 08/20/2023: Left leg ultrasound: Acute DVT posterior tibial veins at the ankle level  09/20/2023: Hypercoagulability workup: No lupus anticoagulant, Antithrombin III : 110%, protein S: 132%, protein C: 128%, factor V Leiden: Not detected prothrombin gene mutation: Not detected   Risk factors of the patient: Being in a boot from fracture for a long time as well as obesity.  Also family history    Duration of anticoagulation: long term (at least a year)   Hospitalization 02/18/2024-02/21/2024: Cellulitis left foot (charcot foot) 02/18/2024: Ultrasound lower extremities: No evidence of DVT, few enlarged lymph nodes in the inguinal area   I recommended patient to discontinue anticoagulation at this time.  She understands that if she has any recurrent blood clots she may need long-term anticoagulation.

## 2024-08-28 NOTE — Progress Notes (Signed)
 HEMATOLOGY-ONCOLOGY TELEPHONE VISIT PROGRESS NOTE  I connected with our patient on 08/28/24 at  2:00 PM EST by telephone and verified that I am speaking with the correct person using two identifiers.  I discussed the limitations, risks, security and privacy concerns of performing an evaluation and management service by telephone and the availability of in person appointments.  I also discussed with the patient that there may be a patient responsible charge related to this service. The patient expressed understanding and agreed to proceed.   History of Present Illness: Follow-up to discuss anticoagulation  History of Present Illness Alicia Coffey is a 53 year old female who presents for follow-up regarding blood thinner therapy.  She has completed a six-month course of anticoagulation therapy with Eliquis  for chronic deep vein thrombosis of the left lower extremity. She uses compression socks on her previously injured foot to aid circulation and prevent further clotting issues.  REVIEW OF SYSTEMS:   Constitutional: Denies fevers, chills or abnormal weight loss All other systems were reviewed with the patient and are negative. Observations/Objective:     Assessment Plan:  Chronic deep vein thrombosis (DVT) of left lower extremity (HCC) 08/20/2023: Left leg ultrasound: Acute DVT posterior tibial veins at the ankle level  09/20/2023: Hypercoagulability workup: No lupus anticoagulant, Antithrombin III : 110%, protein S: 132%, protein C: 128%, factor V Leiden: Not detected prothrombin gene mutation: Not detected   Risk factors of the patient: Being in a boot from fracture for a long time as well as obesity.  Also family history     Hospitalization 02/18/2024-02/21/2024: Cellulitis left foot (charcot foot) 02/18/2024: Ultrasound lower extremities: No evidence of DVT, few enlarged lymph nodes in the inguinal area   I recommended patient to discontinue anticoagulation at this time.  She understands that  if she has any recurrent blood clots she may need long-term anticoagulation. Return to clinic on an as-needed basis Assessment & Plan Chronic deep vein thrombosis of left lower extremity Completed six months of anticoagulation with Eliquis . Transitioning off anticoagulation with slight recurrence risk. - Discontinued Eliquis . - Advised to maintain mobility and use compression socks on affected foot. - Instructed to call if issues arise for potential ultrasound evaluation.      I discussed the assessment and treatment plan with the patient. The patient was provided an opportunity to ask questions and all were answered. The patient agreed with the plan and demonstrated an understanding of the instructions. The patient was advised to call back or seek an in-person evaluation if the symptoms worsen or if the condition fails to improve as anticipated.   I provided 20 minutes of non-face-to-face time during this encounter.  This includes time for charting and coordination of care   Naomi MARLA Chad, MD

## 2024-10-02 ENCOUNTER — Encounter: Payer: Self-pay | Admitting: Orthopedic Surgery

## 2024-10-02 ENCOUNTER — Other Ambulatory Visit (INDEPENDENT_AMBULATORY_CARE_PROVIDER_SITE_OTHER): Payer: Self-pay

## 2024-10-02 ENCOUNTER — Ambulatory Visit: Admitting: Orthopedic Surgery

## 2024-10-02 DIAGNOSIS — E11610 Type 2 diabetes mellitus with diabetic neuropathic arthropathy: Secondary | ICD-10-CM

## 2024-10-02 NOTE — Progress Notes (Signed)
 "  Office Visit Note   Patient: Alicia Coffey           Date of Birth: [birth year]           MRN: [id number] Visit Date: 10/02/2024              Requested by: Lenon Nell SAILOR, FNP [address] [place],  [place] [address] PCP: Lenon Nell SAILOR, FNP  Chief Complaint  Patient presents with   Left Foot - Follow-up      HPI: Discussed the use of AI scribe software for clinical note transcription with the patient, who gave verbal consent to proceed.  History of Present Illness Alicia Coffey is a 53 year old female with Charcot arthropathy of the left foot who presents for routine follow-up evaluation.  The patient has a history of Charcot arthropathy involving the left foot. Recent radiographs were performed to evaluate the current status.  She recently received new custom CROW walker shoes with a brace, which she finds comfortable and notes provide improved appearance and fit compared to her previous orthotic device.  She developed a blister on the left foot that opened prior to this visit. She has been treating the area with topical antibiotic cream and maintaining local wound care. She discontinued use of peroxide after learning it may be harmful to healthy tissue. She remains vigilant with skin care and is monitoring for further breakdown.  She denies current ulcers, warmth, signs of infection, new pain, instability, or changes in foot position or circulation.     Assessment & Plan: Visit Diagnoses:  1. Charcot joint of foot due to diabetes [facility])     Plan: Assessment and Plan Assessment & Plan Charcot arthropathy of the left foot due to diabetes Charcot collapse at talonavicular and cuboid joints stable on radiographs. No ulcers, cellulitis, or increased warmth. Strong dorsalis pedis pulse. - Reevaluate in three months with repeat three-view radiographs of the left foot. - Recommend Vashe wound solution for new blisters or skin breakdown. - Instruct to continue use  of custom CROW walker shoes and maintain foot hygiene. - Advise to report new symptoms including blisters, ulcers, or changes in foot condition.      Follow-Up Instructions: Return in about 3 months (around 12/31/2024).   Ortho Exam  Patient is alert, oriented, no adenopathy, well-dressed, normal affect, normal respiratory effort. Physical Exam EXTREMITIES: Stable Charcot collapse through the hindfoot. No ulcers, cellulitis, or warmth in the foot. Strong dorsalis pedis pulse.      Imaging: XR Foot Complete Left Result Date: 10/02/2024 Three-view radiographs of the left foot shows stable Charcot collapse through the talonavicular and calcaneocuboid joint.  No progressive deformity.  No images are attached to the encounter.  Labs: Lab Results  Component Value Date   HGBA1C 9.5 (H) 02/19/2024   HGBA1C 7.5 (H) 08/23/2018   HGBA1C 8.8 (H) 11/24/2017   ESRSEDRATE 51 (H) 03/14/2024   ESRSEDRATE 55 (H) 02/19/2024   ESRSEDRATE 60 (H) 02/18/2024   CRP 4.4 03/14/2024   CRP 4.5 (H) 02/19/2024   CRP 5.1 (H) 02/18/2024   REPTSTATUS 04/11/2024 FINAL 04/06/2024   CULT  04/06/2024    NO GROWTH 5 DAYS Performed at [facility], [address], [place], [place] [address]      Lab Results  Component Value Date   ALBUMIN 3.9 04/06/2024   ALBUMIN 3.8 12/29/2023   ALBUMIN 3.2 (L) 09/21/2023    No results found for: MG No results found  for: VD25OH  No results found for: PREALBUMIN    Latest Ref Rng & Units 04/06/2024   12:18 PM 03/14/2024   10:43 AM 02/21/2024    3:48 AM  CBC EXTENDED  WBC 4.0 - 10.5 K/uL 10.8  11.2  9.1   RBC 3.87 - 5.11 MIL/uL 4.59  4.11  3.82   Hemoglobin 12.0 - 15.0 g/dL 87.2  88.5  89.4   HCT 36.0 - 46.0 % 41.0  36.1  33.8   Platelets 150 - 400 K/uL 218  312  219   NEUT# 1.7 - 7.7 K/uL 7.1  7,011    Lymph# 0.7 - 4.0 K/uL 2.0        There is no height or weight on file to calculate BMI.  Orders:  Orders Placed This Encounter  Procedures    XR Foot Complete Left   No orders of the defined types were placed in this encounter.    Procedures: No procedures performed  Clinical Data: No additional findings.  ROS:  All other systems negative, except as noted in the HPI. Review of Systems  Objective: Vital Signs: There were no vitals taken for this visit.  Specialty Comments:  No specialty comments available.  PMFS History: Patient Active Problem List   Diagnosis Date Noted   Type 2 diabetes mellitus with hyperlipidemia (HCC) 02/19/2024   Chronic deep vein thrombosis (DVT) of left lower extremity (HCC) 02/19/2024   Osteomyelitis (HCC) 02/18/2024   Cellulitis of left ankle 02/18/2024   Leg DVT (deep venous thromboembolism), acute, left (HCC) 09/20/2023   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   Morbid obesity due to excess calories (HCC) 10/09/2022   Tired 05/20/2022   History of breast cancer 05/20/2022   Meralgia paraesthetica 08/10/2021   Encounter for gynecological examination with Papanicolaou smear of cervix 04/22/2021   Encounter for screening fecal occult blood testing 04/22/2021   Sweats, menopausal 03/24/2021   Hot flashes 03/24/2021   Weakness 03/24/2021   Dyspnea on exertion 03/24/2021   Perimenopause 03/24/2021   Degeneration of lumbosacral intervertebral disc 07/31/2020   Congenital pes planus 07/31/2020   Encounter for orthopedic follow-up care 12/14/2019   Acquired trigger finger of left ring finger 11/15/2019   Digital mucinous cyst of finger 11/15/2019   Triggering of finger 10/17/2019   Other obesity due to excess calories 07/19/2019   Chronic kidney disease due to type 2 diabetes mellitus (HCC) 07/19/2019   Secondary hyperparathyroidism 07/19/2019   Vitamin D deficiency 07/19/2019   Pain in finger of right hand 06/15/2017   Sprain of interphalangeal joint of right ring finger 06/15/2017   Diabetes 1.5, 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   Idiopathic peripheral neuropathy  09/21/2016   Obesity, class 3 (HCC) 09/21/2016   Obstructive sleep apnea of adult 09/21/2016   Oropharyngeal dysphagia 09/21/2016   Vertigo 09/21/2016   Menorrhagia with irregular cycle 07/25/2014   Iron deficiency anemia due to chronic blood loss 07/25/2014   Encounter for follow-up 05/23/2014   Fibroid, uterine 04/04/2014   Abnormal uterine bleeding (AUB) 03/26/2014   Diabetic peripheral neuropathy (HCC) 12/21/2013   Low back pain 12/11/2013   Pain in joint, shoulder region 12/11/2013   Pain in limb 12/11/2013   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   Morbid obesity with BMI of 40.0-44.9, adult (HCC)    Obstructive sleep apnea of adult 2017   Oropharyngeal dysphagia    Peripheral neuropathy    Proteinuria    Secondary hyperparathyroidism    Shortness of breath on exertion    Type 2 diabetes mellitus (HCC)    Uterine fibroid    Vertigo 2015   Vitamin D deficiency    Wears dentures     Family History  Problem Relation Age of Onset   Diabetes Mother    Heart disease Mother    Hypertension Mother    Diabetes Father    Heart disease Father    Hypertension Father    Heart attack Father    Diabetes Sister    Fibromyalgia Sister    Diabetes Sister    Diabetes Brother    Heart attack Maternal Grandmother        <35   Heart attack  Maternal Grandfather    Breast cancer Paternal Grandmother 66       breast; dbl mastectomy   Heart attack Paternal Grandfather    Colon polyps Neg Hx    Colon cancer Neg Hx    Crohn's disease Neg Hx    Esophageal cancer Neg Hx    Rectal cancer Neg Hx    Stomach cancer Neg Hx     Past Surgical History:  Procedure Laterality Date   ABLATION     uterine    BREAST BIOPSY Left 2014   benign   BREAST BIOPSY Right 2013   high risk   BREAST BIOPSY Left 2021   benign   BREAST BIOPSY Left 2021   benign   BREAST BIOPSY Right 07/22/2023   US  RT BREAST BX W LOC DEV 1ST LESION IMG BX SPEC US  GUIDE 07/22/2023 [facility]   BREAST EXCISIONAL BIOPSY Left    BREAST LUMPECTOMY Left 2021   LCIS   BREAST LUMPECTOMY WITH NEEDLE LOCALIZATION  08/15/2012   Procedure: BREAST LUMPECTOMY WITH NEEDLE LOCALIZATION;  Surgeon: Sherlean JINNY Laughter, MD;  Location: [facility];  Service: General;  Laterality: Right;  Wire localizations Right breast calcifications   BREAST LUMPECTOMY WITH RADIOACTIVE SEED LOCALIZATION Left 01/30/2020   Procedure: LEFT BREAST LUMPECTOMY X 2 WITH RADIOACTIVE SEED LOCALIZATION;  Surgeon: Vernetta Berg, MD;  Location: [facility];  Service: General;  Laterality: Left;   CARPAL TUNNEL RELEASE Right 11/24/2017   Procedure: RIGHT CARPAL TUNNEL RELEASE;  Surgeon: Gillie Duncans, MD;  Location: [facility];  Service: Neurosurgery;  Laterality: Right;  Right CARPAL TUNNEL RELEASE   CARPAL TUNNEL RELEASE Left 08/31/2018   Procedure: LEFT CARPAL TUNNEL RELEASE;  Surgeon: Gillie Duncans, MD;  Location: [facility];  Service: Neurosurgery;  Laterality: Left;   DILITATION & CURRETTAGE/HYSTROSCOPY WITH THERMACHOICE ABLATION N/A 08/21/2014   Procedure: DILATATION & CURETTAGE/HYSTEROSCOPY WITH THERMACHOICE ABLATION;  Surgeon: Norleen Edsel GAILS, MD;  Location: [facility];  Service: Gynecology;  Laterality: N/A;   KNEE ARTHROSCOPY Left 10/16/2015   Procedure: ARTHROSCOPY KNEE  with  debridment;  Surgeon: Dempsey Sensor, MD;  Location: [facility];  Service: Orthopedics;  Laterality: Left;   KNEE ARTHROSCOPY WITH MEDIAL MENISECTOMY Right 07/17/2015   Procedure: KNEE ARTHROSCOPY WITH MEDIAL MENISECTOMY;  Surgeon: Dempsey Sensor, MD;  Location: [facility];  Service: Orthopedics;  Laterality: Right;   POLYPECTOMY N/A 08/21/2014   Procedure: ENDOMETRIAL POLYPECTOMY;  Surgeon: Norleen Edsel GAILS, MD;  Location: [facility];  Service: Gynecology;  Laterality: N/A;   REFRACTIVE SURGERY Right    micro aneuysms   TRIGGER FINGER RELEASE Left    ULNAR NERVE TRANSPOSITION Right 11/24/2017   Procedure: RIGHT ULNAR NERVE DECOMPRESSION/TRANSPOSITION;  Surgeon: Gillie Duncans, MD;  Location: [facility];  Service: Neurosurgery;  Laterality: Right;  Right ULNAR NERVE DECOMPRESSION/TRANSPOSITION   Social History   Occupational History   Occupation: care giver    Employer: [employer]  Tobacco Use   Smoking status: Never    Passive exposure: Never   Smokeless tobacco: Never  Vaping Use   Vaping status: Never Used  Substance and Sexual Activity   Alcohol use: No   Drug use: No   Sexual activity: Not on file         "

## 2025-01-01 ENCOUNTER — Ambulatory Visit: Admitting: Orthopedic Surgery

## 2025-01-10 ENCOUNTER — Ambulatory Visit: Admitting: Podiatry

## 2025-01-12 ENCOUNTER — Other Ambulatory Visit

## 2025-02-01 ENCOUNTER — Ambulatory Visit: Admitting: Pulmonary Disease
